# Patient Record
Sex: Female | Born: 1945 | Race: White | Hispanic: No | Smoking: Former smoker
Health system: Southern US, Community
[De-identification: ages and names within clinical notes are randomized; demographics above are authoritative.]

## PROBLEM LIST (undated history)

## (undated) DIAGNOSIS — I4891 Unspecified atrial fibrillation: Secondary | ICD-10-CM

## (undated) DIAGNOSIS — F0390 Unspecified dementia without behavioral disturbance: Secondary | ICD-10-CM

## (undated) DIAGNOSIS — F32A Depression, unspecified: Secondary | ICD-10-CM

## (undated) DIAGNOSIS — C50919 Malignant neoplasm of unspecified site of unspecified female breast: Secondary | ICD-10-CM

## (undated) DIAGNOSIS — I10 Essential (primary) hypertension: Secondary | ICD-10-CM

## (undated) DIAGNOSIS — I639 Cerebral infarction, unspecified: Secondary | ICD-10-CM

## (undated) DIAGNOSIS — I5042 Chronic combined systolic (congestive) and diastolic (congestive) heart failure: Secondary | ICD-10-CM

## (undated) DIAGNOSIS — J449 Chronic obstructive pulmonary disease, unspecified: Secondary | ICD-10-CM

## (undated) DIAGNOSIS — F419 Anxiety disorder, unspecified: Secondary | ICD-10-CM

## (undated) DIAGNOSIS — C801 Malignant (primary) neoplasm, unspecified: Secondary | ICD-10-CM

## (undated) HISTORY — DX: Malignant neoplasm of unspecified site of unspecified female breast: C50.919

## (undated) HISTORY — DX: Unspecified atrial fibrillation: I48.91

## (undated) HISTORY — DX: Chronic combined systolic (congestive) and diastolic (congestive) heart failure: I50.42

## (undated) HISTORY — PX: ABDOMINAL HYSTERECTOMY: SHX81

---

## 2015-04-29 DIAGNOSIS — I480 Paroxysmal atrial fibrillation: Secondary | ICD-10-CM

## 2015-04-29 DIAGNOSIS — I4891 Unspecified atrial fibrillation: Secondary | ICD-10-CM

## 2015-04-29 HISTORY — DX: Paroxysmal atrial fibrillation: I48.0

## 2015-04-29 HISTORY — DX: Unspecified atrial fibrillation: I48.91

## 2015-10-25 DIAGNOSIS — I480 Paroxysmal atrial fibrillation: Secondary | ICD-10-CM | POA: Diagnosis present

## 2015-10-25 DIAGNOSIS — I10 Essential (primary) hypertension: Secondary | ICD-10-CM | POA: Diagnosis present

## 2015-10-25 DIAGNOSIS — I4819 Other persistent atrial fibrillation: Secondary | ICD-10-CM

## 2017-05-06 DIAGNOSIS — Z87891 Personal history of nicotine dependence: Secondary | ICD-10-CM | POA: Insufficient documentation

## 2017-05-06 DIAGNOSIS — M8589 Other specified disorders of bone density and structure, multiple sites: Secondary | ICD-10-CM | POA: Insufficient documentation

## 2017-05-06 DIAGNOSIS — E782 Mixed hyperlipidemia: Secondary | ICD-10-CM | POA: Diagnosis present

## 2017-05-06 HISTORY — DX: Personal history of nicotine dependence: Z87.891

## 2018-02-25 DIAGNOSIS — Z853 Personal history of malignant neoplasm of breast: Secondary | ICD-10-CM | POA: Insufficient documentation

## 2020-10-29 ENCOUNTER — Observation Stay (HOSPITAL_COMMUNITY)
Admission: EM | Admit: 2020-10-29 | Discharge: 2020-10-30 | Disposition: A | Discharge status: Home / Self Care | Payer: Medicare Other | Attending: Student in an Organized Health Care Education/Training Program | Admitting: Student in an Organized Health Care Education/Training Program

## 2020-10-29 ENCOUNTER — Emergency Department (HOSPITAL_COMMUNITY): Payer: Medicare Other

## 2020-10-29 ENCOUNTER — Encounter (HOSPITAL_COMMUNITY): Payer: Self-pay | Admitting: Emergency Medicine

## 2020-10-29 ENCOUNTER — Other Ambulatory Visit: Payer: Self-pay

## 2020-10-29 ENCOUNTER — Inpatient Hospital Stay (HOSPITAL_COMMUNITY): Payer: Medicare Other

## 2020-10-29 DIAGNOSIS — G459 Transient cerebral ischemic attack, unspecified: Secondary | ICD-10-CM | POA: Diagnosis present

## 2020-10-29 DIAGNOSIS — I4819 Other persistent atrial fibrillation: Secondary | ICD-10-CM | POA: Diagnosis present

## 2020-10-29 DIAGNOSIS — J449 Chronic obstructive pulmonary disease, unspecified: Secondary | ICD-10-CM | POA: Insufficient documentation

## 2020-10-29 DIAGNOSIS — F17210 Nicotine dependence, cigarettes, uncomplicated: Secondary | ICD-10-CM | POA: Diagnosis not present

## 2020-10-29 DIAGNOSIS — U071 COVID-19: Secondary | ICD-10-CM | POA: Insufficient documentation

## 2020-10-29 DIAGNOSIS — I10 Essential (primary) hypertension: Secondary | ICD-10-CM | POA: Diagnosis present

## 2020-10-29 DIAGNOSIS — I480 Paroxysmal atrial fibrillation: Secondary | ICD-10-CM | POA: Insufficient documentation

## 2020-10-29 DIAGNOSIS — I639 Cerebral infarction, unspecified: Principal | ICD-10-CM | POA: Diagnosis present

## 2020-10-29 DIAGNOSIS — R29810 Facial weakness: Secondary | ICD-10-CM

## 2020-10-29 DIAGNOSIS — R4781 Slurred speech: Secondary | ICD-10-CM

## 2020-10-29 DIAGNOSIS — E782 Mixed hyperlipidemia: Secondary | ICD-10-CM | POA: Diagnosis present

## 2020-10-29 DIAGNOSIS — E876 Hypokalemia: Secondary | ICD-10-CM | POA: Insufficient documentation

## 2020-10-29 HISTORY — DX: Chronic obstructive pulmonary disease, unspecified: J44.9

## 2020-10-29 HISTORY — DX: Cerebral infarction, unspecified: I63.9

## 2020-10-29 HISTORY — DX: Unspecified dementia, unspecified severity, without behavioral disturbance, psychotic disturbance, mood disturbance, and anxiety: F03.90

## 2020-10-29 HISTORY — DX: Malignant (primary) neoplasm, unspecified: C80.1

## 2020-10-29 LAB — CBC
HCT: 35 % — ABNORMAL LOW (ref 36.0–46.0)
Hemoglobin: 12 g/dL (ref 12.0–15.0)
MCH: 30.1 pg (ref 26.0–34.0)
MCHC: 34.3 g/dL (ref 30.0–36.0)
MCV: 87.7 fL (ref 80.0–100.0)
Platelets: 359 10*3/uL (ref 150–400)
RBC: 3.99 MIL/uL (ref 3.87–5.11)
RDW: 13.6 % (ref 11.5–15.5)
WBC: 8.5 10*3/uL (ref 4.0–10.5)
nRBC: 0 % (ref 0.0–0.2)

## 2020-10-29 LAB — COMPREHENSIVE METABOLIC PANEL
ALT: 16 U/L (ref 0–44)
AST: 22 U/L (ref 15–41)
Albumin: 3.3 g/dL — ABNORMAL LOW (ref 3.5–5.0)
Alkaline Phosphatase: 81 U/L (ref 38–126)
Anion gap: 11 (ref 5–15)
BUN: 10 mg/dL (ref 8–23)
CO2: 29 mmol/L (ref 22–32)
Calcium: 8.6 mg/dL — ABNORMAL LOW (ref 8.9–10.3)
Chloride: 97 mmol/L — ABNORMAL LOW (ref 98–111)
Creatinine, Ser: 0.92 mg/dL (ref 0.44–1.00)
GFR, Estimated: >60 mL/min (ref >60)
Glucose, Bld: 128 mg/dL — ABNORMAL HIGH (ref 70–99)
Potassium: 2.5 mmol/L — CL (ref 3.5–5.1)
Sodium: 137 mmol/L (ref 135–145)
Total Bilirubin: 1 mg/dL (ref 0.3–1.2)
Total Protein: 5.8 g/dL — ABNORMAL LOW (ref 6.5–8.1)

## 2020-10-29 LAB — CBG MONITORING, ED: Glucose-Capillary: 127 mg/dL — ABNORMAL HIGH (ref 70–99)

## 2020-10-29 LAB — DIFFERENTIAL
Abs Immature Granulocytes: 0.04 10*3/uL (ref 0.00–0.07)
Basophils Absolute: 0 10*3/uL (ref 0.0–0.1)
Basophils Relative: 0 %
Eosinophils Absolute: 0.1 10*3/uL (ref 0.0–0.5)
Eosinophils Relative: 1 %
Immature Granulocytes: 1 %
Lymphocytes Relative: 10 %
Lymphs Abs: 0.8 10*3/uL (ref 0.7–4.0)
Monocytes Absolute: 0.5 10*3/uL (ref 0.1–1.0)
Monocytes Relative: 6 %
Neutro Abs: 7 10*3/uL (ref 1.7–7.7)
Neutrophils Relative %: 82 %

## 2020-10-29 LAB — PROTIME-INR
INR: 1.1 (ref 0.8–1.2)
Prothrombin Time: 13.8 seconds (ref 11.4–15.2)

## 2020-10-29 LAB — I-STAT CHEM 8, ED
BUN: 9 mg/dL (ref 8–23)
Calcium, Ion: 1.09 mmol/L — ABNORMAL LOW (ref 1.15–1.40)
Chloride: 94 mmol/L — ABNORMAL LOW (ref 98–111)
Creatinine, Ser: 0.9 mg/dL (ref 0.44–1.00)
Glucose, Bld: 127 mg/dL — ABNORMAL HIGH (ref 70–99)
HCT: 36 % (ref 36.0–46.0)
Hemoglobin: 12.2 g/dL (ref 12.0–15.0)
Potassium: 2.3 mmol/L — CL (ref 3.5–5.1)
Sodium: 137 mmol/L (ref 135–145)
TCO2: 27 mmol/L (ref 22–32)

## 2020-10-29 LAB — RESP PANEL BY RT-PCR (FLU A&B, COVID) ARPGX2
Influenza A by PCR: NEGATIVE
Influenza B by PCR: NEGATIVE
SARS Coronavirus 2 by RT PCR: POSITIVE — AB

## 2020-10-29 LAB — APTT: aPTT: 24 seconds (ref 24–36)

## 2020-10-29 LAB — MAGNESIUM: Magnesium: 1.4 mg/dL — ABNORMAL LOW (ref 1.7–2.4)

## 2020-10-29 MED ORDER — STROKE: EARLY STAGES OF RECOVERY BOOK: Freq: Once | Status: DC

## 2020-10-29 MED ORDER — IOHEXOL 350 MG/ML SOLN
100.0000 mL | Freq: Once | INTRAVENOUS | Status: AC | PRN
  Administered 2020-10-29: 100 mL via INTRAVENOUS

## 2020-10-29 MED ORDER — POTASSIUM CHLORIDE 10 MEQ/100ML IV SOLN
10.0000 meq | INTRAVENOUS | Status: AC
  Administered 2020-10-29 (×2): 10 meq via INTRAVENOUS
  Filled 2020-10-29 (×2): qty 100

## 2020-10-29 MED ORDER — ACETAMINOPHEN 325 MG PO TABS: 650.0000 mg | ORAL_TABLET | ORAL | Status: DC | PRN

## 2020-10-29 MED ORDER — ACETAMINOPHEN 160 MG/5ML PO SOLN: 650.0000 mg | ORAL | Status: DC | PRN

## 2020-10-29 MED ORDER — CLOPIDOGREL BISULFATE 300 MG PO TABS
300.0000 mg | ORAL_TABLET | Freq: Once | ORAL | Status: AC
  Administered 2020-10-29: 300 mg via ORAL
  Filled 2020-10-29: qty 1

## 2020-10-29 MED ORDER — CLOPIDOGREL BISULFATE 75 MG PO TABS
75.0000 mg | ORAL_TABLET | Freq: Every day | ORAL | Status: DC
  Administered 2020-10-30: 75 mg via ORAL
  Filled 2020-10-29: qty 1

## 2020-10-29 MED ORDER — MAGNESIUM OXIDE -MG SUPPLEMENT 400 (240 MG) MG PO TABS
800.0000 mg | ORAL_TABLET | Freq: Every day | ORAL | Status: DC
  Administered 2020-10-29 – 2020-10-30 (×2): 800 mg via ORAL
  Filled 2020-10-29 (×2): qty 2

## 2020-10-29 MED ORDER — SENNOSIDES-DOCUSATE SODIUM 8.6-50 MG PO TABS: 1.0000 | ORAL_TABLET | Freq: Every evening | ORAL | Status: DC | PRN

## 2020-10-29 MED ORDER — ACETAMINOPHEN 650 MG RE SUPP: 650.0000 mg | RECTAL | Status: DC | PRN

## 2020-10-29 MED ORDER — SODIUM CHLORIDE 0.9% FLUSH: 3.0000 mL | Freq: Once | INTRAVENOUS | Status: DC

## 2020-10-29 MED ORDER — ASPIRIN EC 325 MG PO TBEC
325.0000 mg | DELAYED_RELEASE_TABLET | Freq: Every day | ORAL | Status: DC
  Administered 2020-10-29 – 2020-10-30 (×2): 325 mg via ORAL
  Filled 2020-10-29 (×2): qty 1

## 2020-10-29 NOTE — ED Provider Notes (Signed)
[place] EMERGENCY DEPARTMENT Provider Note   CSN: [id number] Arrival date & time: 10/29/20  1306  An emergency department physician performed an initial assessment on this suspected stroke patient at 1307.  History Chief Complaint  Patient presents with   Code Stroke    Marisa Nunez is a 75 y.o. female brought in by EMS for evaluation of facial droop, slurred speech.  Patient reports she was at home when her daughter noticed that patient's left side of her face was drooping at about 1220.  Daughter also noted some slurred speech.  They called EMS.  EMS reports that by the time they got there, symptoms had resolved.  During transport, she had an additional episode where they noted slurred speech, facial droop.  As they arrived to the ED, symptoms seem to resolve.  Patient reports that she has felt normal today.  She did not realize when her face was drooping.  Do not feel like she was having trouble speaking.  She states that she has not had any weakness.  Currently, she denies any symptoms.  She does have history of hypertension.  No history of diabetes.  No smoking.  She denies any chest pain, difficulty breathing, abdominal pain, nausea/vomiting, numbness/weakness.  The history is provided by the patient.      Past Medical History:  Diagnosis Date   Cancer ([place])    COPD (chronic obstructive pulmonary disease) ([place])    Dementia ([place])    Stroke ([place])     There are no problems to display for this patient.   Past Surgical History:  Procedure Laterality Date   ABDOMINAL HYSTERECTOMY       OB History   No obstetric history on file.     No family history on file.  Social History   Tobacco Use   Smoking status: Every Day    Packs/day: 0.50    Pack years: 0.00    Types: Cigarettes  Substance Use Topics   Alcohol use: Yes    Alcohol/week: 6.0 standard drinks    Types: 6 Cans of beer per week    Home Medications Prior to Admission medications    Not on File    Allergies    Patient has no known allergies.  Review of Systems   Review of Systems  Constitutional:  Negative for fever.  Eyes:  Negative for visual disturbance.  Respiratory:  Negative for cough and shortness of breath.   Cardiovascular:  Negative for chest pain.  Gastrointestinal:  Negative for abdominal pain, nausea and vomiting.  Genitourinary:  Negative for dysuria and hematuria.  Neurological:  Positive for facial asymmetry and speech difficulty. Negative for headaches.  All other systems reviewed and are negative.  Physical Exam Updated Vital Signs BP (!) 158/80   Pulse 66   Temp 98.4 F (36.9 C) (Oral)   Resp (!) 27   Ht 5\' 4"  (1.626 m)   Wt 61 kg   SpO2 99%   BMI 23.08 kg/m   Physical Exam Vitals and nursing note reviewed.  Constitutional:      Appearance: Normal appearance. She is well-developed.  HENT:     Head: Normocephalic and atraumatic.  Eyes:     General: Lids are normal.     Conjunctiva/sclera: Conjunctivae normal.     Pupils: Pupils are equal, round, and reactive to light.     Comments: PERRL. EOMs intact. No nystagmus. No neglect.   Cardiovascular:     Rate and Rhythm: Normal rate and  regular rhythm.     Pulses: Normal pulses.          Radial pulses are 2+ on the right side and 2+ on the left side.       Dorsalis pedis pulses are 2+ on the right side and 2+ on the left side.     Heart sounds: Normal heart sounds. No murmur heard.   No friction rub. No gallop.  Pulmonary:     Effort: Pulmonary effort is normal.     Breath sounds: Normal breath sounds.     Comments: Lungs clear to auscultation bilaterally.  Symmetric chest rise.  No wheezing, rales, rhonchi. Abdominal:     Palpations: Abdomen is soft. Abdomen is not rigid.     Tenderness: There is no abdominal tenderness. There is no guarding.     Comments: Abdomen is soft, non-distended, non-tender. No rigidity, No guarding. No peritoneal signs.  Musculoskeletal:         General: Normal range of motion.     Cervical back: Full passive range of motion without pain.  Skin:    General: Skin is warm and dry.     Capillary Refill: Capillary refill takes less than 2 seconds.  Neurological:     Mental Status: She is alert and oriented to person, place, and time.     Comments: Cranial nerves III-XII intact Follows commands, Moves all extremities  5/5 strength to BUE and BLE  Sensation intact throughout all major nerve distributions No slurred speech. No facial droop.   Psychiatric:        Speech: Speech normal.    ED Results / Procedures / Treatments   Labs (all labs ordered are listed, but only abnormal results are displayed) Labs Reviewed  CBC - Abnormal; Notable for the following components:      Result Value   HCT 35.0 (*)    All other components within normal limits  COMPREHENSIVE METABOLIC PANEL - Abnormal; Notable for the following components:   Potassium 2.5 (*)    Chloride 97 (*)    Glucose, Bld 128 (*)    Calcium 8.6 (*)    Total Protein 5.8 (*)    Albumin 3.3 (*)    All other components within normal limits  MAGNESIUM - Abnormal; Notable for the following components:   Magnesium 1.4 (*)    All other components within normal limits  I-STAT CHEM 8, ED - Abnormal; Notable for the following components:   Potassium 2.3 (*)    Chloride 94 (*)    Glucose, Bld 127 (*)    Calcium, Ion 1.09 (*)    All other components within normal limits  CBG MONITORING, ED - Abnormal; Notable for the following components:   Glucose-Capillary 127 (*)    All other components within normal limits  RESP PANEL BY RT-PCR (FLU A&B, COVID) [id number]  PROTIME-INR  APTT  DIFFERENTIAL    EKG EKG Interpretation  Date/Time:  Monday October 29 2020 13:36:26 EDT Ventricular Rate:  71 PR Interval:  182 QRS Duration: 107 QT Interval:  445 QTC Calculation: 484 R Axis:   58 Text Interpretation: Sinus rhythm Minimal ST depression, anterolateral leads No STEMI Confirmed  by Octaviano Glow [phone]) on 10/29/2020 2:14:40 PM  Radiology CT HEAD CODE STROKE WO CONTRAST  Result Date: 10/29/2020 CLINICAL DATA:  Code stroke. EXAM: CT HEAD WITHOUT CONTRAST TECHNIQUE: Contiguous axial images were obtained from the base of the skull through the vertex without intravenous contrast. COMPARISON:  None. FINDINGS: Brain: There is  no acute intracranial hemorrhage, mass effect, or edema. Gray-white differentiation is preserved. Prominence of the ventricles and sulci reflects generalized parenchymal volume loss. Patchy and confluent areas of low-attenuation in the supratentorial white matter is nonspecific but may reflect moderate chronic microvascular ischemic changes. Possible chronic anteroinferior right cerebellar infarct. Additional more posterior age-indeterminate small right cerebellar infarct. No extra-axial collection. Vascular: No hyperdense vessel. Intracranial atherosclerotic calcification at the skull base. Skull: Unremarkable. Sinuses/Orbits: No acute abnormality. Other: Mastoid air cells are clear. ASPECTS ([place] Stroke Program Early CT Score) - Ganglionic level infarction (caudate, lentiform nuclei, internal capsule, insula, M1-M3 cortex): 7 - Supraganglionic infarction (M4-M6 cortex): 3 Total score (0-10 with 10 being normal): 10 IMPRESSION: There is no acute intracranial hemorrhage or evidence of acute infarction. ASPECT score is 10. Age-indeterminate small right cerebellar infarct. Moderate chronic microvascular ischemic changes. These results were communicated to Dr. Leonel Ramsay at 1:22 pm on 10/29/2020 by text page via the [facility] messaging system. Electronically Signed   By: Macy Mis M.D.   On: 10/29/2020 13:26   CT ANGIO HEAD NECK W WO CM (CODE STROKE)  Result Date: 10/29/2020 CLINICAL DATA:  Episode of slurred speech EXAM: CT ANGIOGRAPHY HEAD AND NECK TECHNIQUE: Multidetector CT imaging of the head and neck was performed using the standard protocol during bolus  administration of intravenous contrast. Multiplanar CT image reconstructions and MIPs were obtained to evaluate the vascular anatomy. Carotid stenosis measurements (when applicable) are obtained utilizing NASCET criteria, using the distal internal carotid diameter as the denominator. CONTRAST:  121mL OMNIPAQUE IOHEXOL 350 MG/ML SOLN COMPARISON:  None. FINDINGS: CTA NECK Aortic arch: Moderate mixed plaque along the included arch. Great vessel origins are patent. No high-grade proximal subclavian stenosis. Right carotid system: Patent. Calcified plaque along the common carotid causing less than 50% stenosis. Calcified plaque along the proximal internal carotid causing less than 50% stenosis. Left carotid system: Patent. Mixed plaque along the common carotid causing less than 50% stenosis. There is significant motion artifact through the bifurcation. Calcified plaque is present without apparent hemodynamically significant stenosis. Vertebral arteries: Patent.  Codominant.  No stenosis. Skeleton: Advanced degenerative changes of cervical spine. Other neck: Unremarkable. Upper chest: No apical lung mass. Review of the MIP images confirms the above findings CTA HEAD Anterior circulation: Intracranial internal carotid arteries are patent with mild calcified plaque and no significant stenosis. Anterior and middle cerebral arteries are patent. Posterior circulation: Intracranial vertebral arteries are patent. Basilar artery is patent. Major cerebellar artery origins are patent. Small left posterior communicating arteries present. Posterior cerebral arteries are patent. Venous sinuses: Patent as allowed by contrast bolus timing. Review of the MIP images confirms the above findings IMPRESSION: No large vessel occlusion in the head or neck. Plaque along the common and internal carotid causes less than 50% stenosis, noting that the left common carotid bifurcation is not well evaluated due to artifact. These results were  communicated to Dr. Leonel Ramsay at 1:36 pm on 10/29/2020 by text page via the [facility] messaging system. Electronically Signed   By: Macy Mis M.D.   On: 10/29/2020 13:46    Procedures Procedures   Medications Ordered in ED Medications  sodium chloride flush (NS) 0.9 % injection 3 mL (3 mLs Intravenous Not Given 10/29/20 1352)  clopidogrel (PLAVIX) tablet 75 mg (has no administration in time range)  aspirin EC tablet 325 mg (325 mg Oral Given 10/29/20 1338)  potassium chloride 10 mEq in 100 mL IVPB (10 mEq Intravenous New Bag/Given 10/29/20 1439)  magnesium oxide (MAG-OX)  tablet 800 mg (has no administration in time range)  clopidogrel (PLAVIX) tablet 300 mg (300 mg Oral Given 10/29/20 1339)  iohexol (OMNIPAQUE) 350 MG/ML injection 100 mL (100 mLs Intravenous Contrast Given 10/29/20 1332)    ED Course  I have reviewed the triage vital signs and the nursing notes.  Pertinent labs & imaging results that were available during my care of the patient were reviewed by me and considered in my medical decision making (see chart for details).  Clinical Course as of 10/29/20 1600  Mon Oct 29, 6528  6530 75 year old female presenting to the emergency department for right-sided facial droop this morning.  This was transient and since resolved.  Reports a history of hypertension that is controlled on blood pressure medications.  Denies any other significant comorbidities, denies smoking history, denies history of stroke.  On exam her neurological exam is intact.  CT scan showed age-indeterminate small cerebellar infarct.  Patient to be admitted for TIA evaluation, likely MRI.  Patient made aware of work-up.  She was also noted to be hypokalemic and potassium was ordered here. [MT]  1414 No chest pain or pressure.  EKG does not show STEMI.  I doubt this is ACS. [MT]    Clinical Course User Index [MT] Trifan, Carola Rhine, MD   MDM Rules/Calculators/A&P                          75 year old female brought in by  EMS for evaluation of facial droop, slurred speech.  They report that did not resolve by the time they got there.  During transport, she had additional episode that started to resolve by the time she got to the ED.  On initial arrival, she is afebrile nontoxic-appearing.  Vital signs are stable.  On exam, no obvious facial droop, slurred speech noted.  Patient initially presented as code stroke.  I-STAT Chem-8 shows potassium 2.3.  CBC shows no leukocytosis or anemia.  CMP shows potassium of 2.5.  BUN/creatinine within normal limits.  We will add on mag.  Magnesium is low at 1.4.  CT head negative for any acute infection.  CTA shows no large vessel occlusion in the head or neck.  She does have some carotid artery stenosis.  Discussed with Dr. Leonel Ramsay (neuro).  He recommends obtaining an MRI.  Patient will need admission for TIA work-up.  Discussed with internal medicine team who accepts patient for admission.   Portions of this note were generated with [employer]. Dictation errors may occur despite best attempts at proofreading.  Final Clinical Impression(s) / ED Diagnoses Final diagnoses:  Slurred speech  Facial droop  Hypokalemia  Hypomagnesemia    Rx / DC Orders ED Discharge Orders     None        Volanda Napoleon, PA-C 10/29/20 1600    Wyvonnia Dusky, MD 10/29/20 1652

## 2020-10-29 NOTE — Code Documentation (Addendum)
Pt is 75 yr old female arriving to [facility] at 75 for evaluation of facial droop and slurred speech. Pt EMS report, pt was last known well by family at 42 when she had sudden onset of slurred speech and facial asymmetry. Pt was cleared at bridge by EDP, CBG and blood drawn. Pt was taken to CT at 1310. Pt was dysarthric and tongue deviated to the right. She was unable to recall the month (NIHSS 3). She was alert and fluent, without weakness or visual disturbances. Per Dr Leonel Ramsay, [facility] negative for acute hemorrhage. After CTH, pt's dysarthria and tongue deviation resolved. CTA obtained, and per Dr Leonel Ramsay, was negative for LVO. Pt was taken to room 22. [facility] Stroke swallow screen was performed, which patient passed. Pt was then given 325 Asprin and 300 mg Plavix. Pt will be monitored q 15 min VS and q 30 min neuro checks until outside of window for TPA (1720). Pt not given TPA as too mild to treat. Pt not given NIR as LVO negative. Bedside handoff with Production assistant, radio.

## 2020-10-29 NOTE — Consult Note (Signed)
Neurology Consultation Reason for Consult: Slurred speech Referring Physician: Rudean Haskell  CC: Slurred speech  History is obtained from: Patient  HPI: Marisa Nunez is a 75 y.o. female with a history of dementia as well as stroke who presents with slurred speech that started abruptly this afternoon.  She was in her normal state of health and talking to her dogs, and then her daughter noticed a sudden change in her speech at 12:50 PM.  She then looked at her daughter, and the daughter noticed left facial droop and therefore she called 911.  Initially, her symptoms cleared on EMS arrival, but while she was being evaluated in the truck, her symptoms returned with severe dysarthria.  On arrival, she had severe dysarthria with right tongue deviation.  Initially, her dysarthria was severe enough that I felt it to be a disabling deficit and actually initially began to approach the patient and her daughter about IV tPA, however following completion of imaging she had cleared to the point where she no longer had a disabling deficit and therefore tPA was not administered.  LKW: 12:50 PM tpa given?: no, mild symptoms    ROS: A 14 point ROS was performed and is negative except as noted in the HPI.  Past Medical History:  Diagnosis Date   Cancer ([place])    COPD (chronic obstructive pulmonary disease) ([place])    Dementia ([place])    Stroke ([place])      Family history: Not initially obtained due to severe dysarthria   Social History:  reports that she has been smoking cigarettes. She has been smoking an average of 0.50 packs per day. She does not have any smokeless tobacco history on file. She reports current alcohol use of about 6.0 standard drinks of alcohol per week. No history on file for drug use.   Exam: Current vital signs: BP (!) 140/119   Pulse 67   Temp 98.4 F (36.9 C) (Oral)   Resp 18   Ht 5\' 4"  (1.626 m)   Wt 61 kg   SpO2 100%   BMI 23.08 kg/m  Vital signs in last 24 hours: Temp:   [98.4 F (36.9 C)] 98.4 F (36.9 C) (07/04 1430) Pulse Rate:  [66-75] 67 (07/04 1545) Resp:  [10-28] 18 (07/04 1530) BP: (128-158)/(62-119) 140/119 (07/04 1545) SpO2:  [98 %-100 %] 100 % (07/04 1545) Weight:  [61 kg] 61 kg (07/04 1430)   Physical Exam  Constitutional: Appears well-developed and well-nourished.  Psych: Affect appropriate to situation Eyes: No scleral injection HENT: No OP obstruction MSK: no joint deformities.  Cardiovascular: Normal rate and regular rhythm.  Respiratory: Effort normal, non-labored breathing GI: Soft.  No distension. There is no tenderness.  Skin: WDI  Neuro: Mental Status: Patient is awake, alert, oriented to person, but not month or year  No signs of aphasia or neglect Cranial Nerves: II: Visual Fields are full. Pupils are equal, round, and reactive to light.   III,IV, VI: EOMI without ptosis or diploplia.  V: Facial sensation is symmetric to temperature VII: Facial movement is symmetric  VIII: hearing is intact to voice X: Uvula elevates symmetrically XI: Shoulder shrug is symmetric. XII: tongue initially deviated significantly to the right Motor: Tone is normal. Bulk is normal. 5/5 strength was present in all four extremities.  Sensory: Sensation is symmetric to light touch and temperature in the arms and legs. Cerebellar: FNF intact bilaterally   I have reviewed labs in epic and the results pertinent to this consultation are: Creatinine  0.9  I have reviewed the images obtained: CT/CTA-negative  Impression: 75 year old female with recurrent severe dysarthria most consistent with small brainstem stroke/TIA.  With her improvement in symptoms, she is not currently an IV tPA candidate but she still has very mild dysarthria, though improving.   Recommendations: - HgbA1c, fasting lipid panel - MRI of the brain without contrast - Frequent neuro checks - Echocardiogram - Prophylactic therapy-ASA 81 mg and plavix 75mg  daily after 300mg   load.  - Risk factor modification - Telemetry monitoring - PT consult, OT consult, Speech consult - Stroke team to follow   Roland Rack, MD [facility] [phone]  If 7pm- 7am, please page neurology on call as listed in [place].

## 2020-10-29 NOTE — ED Notes (Signed)
Patient transported to MRI 

## 2020-10-29 NOTE — H&P (Addendum)
Date: 10/29/2020               Patient Name:  Marisa Nunez MRN: [id number]  DOB: 06-Nov-1945 Age / Sex: 75 y.o., female   PCP: Pcp, No         Medical Service: Internal Medicine Teaching Service         Attending Physician: Dr. Evette Doffing, Mallie Mussel, *    First Contact: Dr. Scarlett Presto Pager: [phone]  Second Contact: Dr. Maudie Mercury Pager: [phone]       After Hours (After 5p/  First Contact Pager: [phone]  weekends / holidays): Second Contact Pager: [phone]   Chief Complaint: "Memory Problems"  History of Present Illness:  Per chart review, patient was brought in for sudden onset facial droop and slurred speech. However patient is unable to remember everything about her history  Patient says that she has had a history of trouble remembering things for a while, but she isn't sure how long. Today her daughter told her that she thought she should come in to the doctor today to find out what was going on. Patient denies any specific trigger that caused her daughter to bring her in today.   Physically says she is has no complaints, she denies any weakness, trouble walking, dizziness, trouble with speech, sensation, or with coordination.   Meds:  Patient does not remember the medications she is taking and say we should talk with her daughter, thinks that she takes a medication for high blood pressure   Allergies: Allergies as of 10/29/2020   (No Known Allergies)   Past Medical History:  Diagnosis Date   Cancer ([place])    COPD (chronic obstructive pulmonary disease) ([place])    Dementia ([place])    Stroke ([place])     Family History: Patient denies any family history of stroke, endorses a history of heart problems in a grandfather  Social History: Lives with a relative, retired, prior Interior and spatial designer, denies any alcohol or current tobacco use, previously smoked 0.5 ppd for about 25 years. Denies any recreational drug use  Review of Systems: A complete ROS was negative  except as per HPI.   Physical Exam: Blood pressure (!) 140/119, pulse 67, temperature 98.4 F (36.9 C), temperature source Oral, resp. rate 18, height 5\' 4"  (1.626 m), weight 61 kg, SpO2 100 %. Gen: well appearing elderly woman resting comfortably in bed HEENT: neck supple, mucous membranes moist, sclerae anicteric CV: RRR, no m/r/g Resp: CTAB GI: soft, nontender, nondistended MSK: Normal bulk and tone, strength 5/5 throughout Neuro: Alert and oriented to self, place, and year. Cranial nerves 2-12 grossly intact, normal coordination, speech slightly dysarthric, memory 0/3  EKG: Sinus rhythm  CT Head:   IMPRESSION: There is no acute intracranial hemorrhage or evidence of acute infarction. ASPECT score is 10.   Age-indeterminate small right cerebellar infarct.   Moderate chronic microvascular ischemic changes.  CT Angio Head and Neck IMPRESSION: No large vessel occlusion in the head or neck. Plaque along the common and internal carotid causes less than 50% stenosis, noting that the left common carotid bifurcation is not well evaluated due to artifact.  Assessment & Plan by Problem: Principal Problem:   Cerebral infarction [facility]) Active Problems:   Essential hypertension   Mixed hyperlipidemia   PAF (paroxysmal atrial fibrillation) (HCC)  Marisa Nunez is a 75 year old woman with a PMS of dementia, prior stroke, and HTN  admitted for a TIA vs stroke.  TIA/Possible stroke Patient initially presented  with left facial droop and dysarthria that seem to have resolved. CT head was negative. -neurology following, appreciate assitance - CT angio showed plaque with <50% occulusion but study limited due to artifact -start aspirin 325 and plavix 75 mg -TSH, Ha1c, Lipid panel pending -MRI Brain pending  COVID 19 Infection Patient saturating well on room air and denies shortness of breath but does have a mild cough. -CTM for progression of symptoms or new onset O2  requirement  Atrial Fibrillation  Per care everywhere patient has been seen by cardiology for afib. Had an admission in 2017 for RVR when she converted to NSR with. Prior echo at that time. Unclear whether she had been anticoagulated prior to admission -likely taking xarelto, per daughter does not think she has been taking this anymore -takes metoprolol succinate 50 mg at home  HTN holding antihypertensives at this time 2/2 permissive hypertension in setting of possible stroke -per daughter taking losartan 50 mg daily and chlorthalidone 25 mg daily at home  COPD Prior smoker. Per daughter she uses an albuterol inhaler -continue albuterol   Anxiety/Depression Per chart review taking 50 mg zoloft and 75 mg wellbutrin at home -confirm with daughter and restart  Dispo: Admit patient to Inpatient with expected length of stay greater than 2 midnights.  Signed: Scarlett Presto, MD 10/29/2020, 4:58 PM  Pager: [phone] After 5pm on weekdays and 1pm on weekends: On Call pager: [phone]

## 2020-10-29 NOTE — ED Triage Notes (Signed)
Pt from home via [facility] as code stroke. Pt had sudden onset of slurred speech and L sided facial droop at 1220 today. Pt's symptoms had resolved by 1230 on EMS arrival. During transport, same symptoms recurred and resolved multiple times.

## 2020-10-30 ENCOUNTER — Inpatient Hospital Stay (HOSPITAL_BASED_OUTPATIENT_CLINIC_OR_DEPARTMENT_OTHER): Payer: Medicare Other

## 2020-10-30 DIAGNOSIS — G459 Transient cerebral ischemic attack, unspecified: Secondary | ICD-10-CM | POA: Diagnosis present

## 2020-10-30 DIAGNOSIS — I6300 Cerebral infarction due to thrombosis of unspecified precerebral artery: Secondary | ICD-10-CM

## 2020-10-30 DIAGNOSIS — I639 Cerebral infarction, unspecified: Secondary | ICD-10-CM

## 2020-10-30 DIAGNOSIS — U071 COVID-19: Secondary | ICD-10-CM | POA: Diagnosis present

## 2020-10-30 HISTORY — DX: Transient cerebral ischemic attack, unspecified: G45.9

## 2020-10-30 LAB — COMPREHENSIVE METABOLIC PANEL
ALT: 16 U/L (ref 0–44)
AST: 20 U/L (ref 15–41)
Albumin: 3.6 g/dL (ref 3.5–5.0)
Alkaline Phosphatase: 78 U/L (ref 38–126)
Anion gap: 12 (ref 5–15)
BUN: 8 mg/dL (ref 8–23)
CO2: 25 mmol/L (ref 22–32)
Calcium: 8.9 mg/dL (ref 8.9–10.3)
Chloride: 95 mmol/L — ABNORMAL LOW (ref 98–111)
Creatinine, Ser: 0.8 mg/dL (ref 0.44–1.00)
GFR, Estimated: >60 mL/min (ref >60)
Glucose, Bld: 101 mg/dL — ABNORMAL HIGH (ref 70–99)
Potassium: 2.4 mmol/L — CL (ref 3.5–5.1)
Sodium: 132 mmol/L — ABNORMAL LOW (ref 135–145)
Total Bilirubin: 0.8 mg/dL (ref 0.3–1.2)
Total Protein: 6 g/dL — ABNORMAL LOW (ref 6.5–8.1)

## 2020-10-30 LAB — LIPID PANEL
Cholesterol: 202 mg/dL — ABNORMAL HIGH (ref 0–200)
HDL: 88 mg/dL (ref >40)
LDL Cholesterol: 100 mg/dL — ABNORMAL HIGH (ref 0–99)
Total CHOL/HDL Ratio: 2.3 RATIO
Triglycerides: 70 mg/dL (ref <150)
VLDL: 14 mg/dL (ref 0–40)

## 2020-10-30 LAB — ECHOCARDIOGRAM COMPLETE
Area-P 1/2: 3.51 cm2
Calc EF: 55.4 %
Height: 64 in
MV M vel: 5.52 m/s
MV Peak grad: 121.9 mmHg
S' Lateral: 3.3 cm
Single Plane A2C EF: 56.2 %
Single Plane A4C EF: 55.2 %
Weight: 2151.69 oz

## 2020-10-30 LAB — HEMOGLOBIN A1C
Hgb A1c MFr Bld: 5.6 % (ref 4.8–5.6)
Mean Plasma Glucose: 114.02 mg/dL

## 2020-10-30 LAB — TSH: TSH: 2.037 u[IU]/mL (ref 0.350–4.500)

## 2020-10-30 MED ORDER — POTASSIUM CHLORIDE CRYS ER 20 MEQ PO TBCR
40.0000 meq | EXTENDED_RELEASE_TABLET | Freq: Two times a day (BID) | ORAL | Status: DC
  Administered 2020-10-30: 40 meq via ORAL
  Filled 2020-10-30: qty 2

## 2020-10-30 MED ORDER — ATORVASTATIN CALCIUM 40 MG PO TABS
80.0000 mg | ORAL_TABLET | Freq: Every day | ORAL | Status: DC
  Administered 2020-10-30: 80 mg via ORAL
  Filled 2020-10-30: qty 2

## 2020-10-30 MED ORDER — APIXABAN 5 MG PO TABS: 5.0000 mg | ORAL_TABLET | Freq: Two times a day (BID) | ORAL | 0 refills | Status: DC

## 2020-10-30 MED ORDER — ATORVASTATIN CALCIUM 80 MG PO TABS: 80.0000 mg | ORAL_TABLET | Freq: Every day | ORAL | 0 refills | Status: DC

## 2020-10-30 NOTE — Progress Notes (Signed)
  Echocardiogram 2D Echocardiogram has been performed.  Marisa Nunez 10/30/2020, 9:48 AM

## 2020-10-30 NOTE — Care Management CC44 (Signed)
Condition Code 44 Documentation Completed  Patient Details  Name: Marisa Nunez MRN: [id number] Date of Birth: 1945/07/12   Condition Code 44 given:  Yes Patient signature on Condition Code 44 notice:  Yes Documentation of 2 MD's agreement:  Yes Code 44 added to claim:  Yes    Fuller Mandril, RN 10/30/2020, 1:50 PM

## 2020-10-30 NOTE — Care Management Obs Status (Signed)
MEDICARE OBSERVATION STATUS NOTIFICATION   Patient Details  Name: Aracelie Addis MRN: [id number] Date of Birth: [birth year]   Medicare Observation Status Notification Given:  Yes    Fuller Mandril, RN 10/30/2020, 1:50 PM

## 2020-10-30 NOTE — Discharge Summary (Addendum)
Name: Marisa Nunez MRN: [id number] DOB: Jul 31, 1945 75 y.o. PCP: Pcp, No  Date of Admission: 10/29/2020  1:08 PM Date of Discharge:  Attending Physician: Axel Filler, *  Discharge Diagnosis: Principal Problem:   Cerebral infarction Marisa Nunez) Active Problems:   Essential hypertension   Mixed hyperlipidemia   PAF (paroxysmal atrial fibrillation) ([place])   COVID-19   TIA (transient ischemic attack)   Discharge Medications: Allergies as of 10/30/2020   No Known Allergies      Medication List     STOP taking these medications    cyclobenzaprine 10 MG tablet Commonly known as: FLEXERIL   potassium chloride 10 MEQ tablet Commonly known as: KLOR-CON       TAKE these medications    apixaban 5 MG Tabs tablet Commonly known as: ELIQUIS Take 1 tablet (5 mg total) by mouth 2 (two) times daily.   atorvastatin 80 MG tablet Commonly known as: LIPITOR Take 1 tablet (80 mg total) by mouth daily. Start taking on: October 31, 2020   buPROPion 75 MG tablet Commonly known as: WELLBUTRIN Take 75 mg by mouth 2 (two) times daily.   chlorthalidone 25 MG tablet Commonly known as: HYGROTON Take 25 mg by mouth daily.   losartan 50 MG tablet Commonly known as: COZAAR Take 50 mg by mouth daily.   memantine 10 MG tablet Commonly known as: NAMENDA Take 10 mg by mouth in the morning.   metoprolol succinate 50 MG 24 hr tablet Commonly known as: TOPROL-XL Take 50 mg by mouth daily. Take with or immediately following a meal.   sertraline 50 MG tablet Commonly known as: ZOLOFT Take 50 mg by mouth daily.        Disposition and follow-up:   Marisa Nunez was discharged from [facility] in Good condition.  At the [facility] follow up visit please address:  1.  Left Frontal Lobe Cerebral Infarction -unclear if patient was anticoagulated in setting of recent infarct, but was previously anticoagulated in the past. Anticoagulation should be restarted once risk  of bleed has decreased  2. Paroxysmal Atrial Fibrillation May consider referral to a cardiologist to manage her atrial fibrillation. Restart anticoagulation as above  3. Hypertension Patient takes losartan 50 and chlorthalidone 25 mg. Titrate as needed  4. HLD  continue high dose statin  5. Monitor for resolution of COVID-19 symptoms  2.  Labs / imaging needed at time of follow-up: None  3.  Pending labs/ test needing follow-up: None  Follow-up Appointments:  Follow-up Information     Vickie Epley, MD Follow up.   Specialties: Cardiology, Radiology Why: 11/30/2020 @ 3:45PM to discuss heart rhythm monitoring option at the request of your neurologist, Dr. Shelle Iron information: [place] [address] [address] [facility] Course by problem list:  1. Left Frontal Lobe Cerebral Infarction Patient came in for acute onset of dysarthria and facial droop. Patient got a CT head which was negative for acute hemorrhage and patient's symptoms resolved. MRI brain identified a left frontal lobe cerebral infarct. Per chart review, patient had a prior history of atrial fibrillation but due to patient's dementia it is unclear whether she was anticoagulated on admission. Patient received aspirin 325 and plavix 75 with plan to transition to eliquis.   2. HTN Patient takes medication for hypertension at home. Held losartan 50 and chlorthalidone 25 mg in the setting of  permissive hypertension after cerebral infarct. Can restart 48 hours after ischemic event.  3. Paroxysmal Atrial Fibrillation Anticoagulation initially held in the setting of acute cerebral infarct, will plan on restarting eliquis as above. Will plan to continue metoprolol succinate 50 mg upon discharge.   4. COVID-19 infection Patient remained mildly symptomatic with a minor cough. Did not require any supplement oxygen and was breathing comfortably on room air.   5.  Hyperlipidemia Total cholesterol and LDL elevated at 202 and 100 respectively.Continue atorvastatin 80 mg daily   6. Anxiety/Depression Patient takes zoloft 50 and wellbutrin 75 at home which will be continued.   7. COPD  Continue home albuterol  Discharge Exam:   BP (!) 165/107   Pulse 68   Temp 98.3 F (36.8 C) (Oral)   Resp 15   Ht 5\' 4"  (1.626 m)   Wt 61 kg   SpO2 100%   BMI 23.08 kg/m  Discharge exam:  Constitutional: well-appearing and comfortably sitting in the bed, in no acute distress Neurological: alert and oriented to person, cranial nerves 2-12 grossly intact 5/5 strength in the upper extremities, able to lift both lower extremities off the bed without difficulty  Cardiovascular: regular rate and rhythm, no m/r/g Pulmonary/Chest: normal work of breathing on room air, lungs clear to auscultation bilaterally Ext: no edema MSK: normal bulk and tone Skin: warm and dry  Pertinent Labs, Studies, and Procedures:   TSH 0.350 - 4.500 uIU/mL 2.037    Cholesterol 0 - 200 mg/dL 202 High    Triglycerides <150 mg/dL 70   HDL >40 mg/dL 88   Total CHOL/HDL Ratio RATIO 2.3   VLDL 0 - 40 mg/dL 14   LDL Cholesterol 0 - 99 mg/dL 100 High     Hgb A1c MFr Bld 4.8 - 5.6 % 5.6     Discharge Instructions:  You were admitted to [facility] for a stroke. Your symptoms with difficulty speaking and other symptoms after the stroke have resolved. You will be started on an anticoagulant medication to try and prevent a new stroke from happening. You will continue taking your home medications with the following changes.  Start taking eliquis 5 mg two times daily  Please follow up with your new primary care provider at [facility] in about 1 week.    Signed: Scarlett Presto, MD 10/30/2020, 1:06 PM   Pager: [phone]

## 2020-10-30 NOTE — Evaluation (Addendum)
Physical Therapy Evaluation Patient Details Name: Marisa Nunez MRN: [id number] DOB: 03-20-1946 Today's Date: 10/30/2020   History of Present Illness  75yo female admitted 10/29/20 secondary to sudden facial droop and slurred speech at home. Sx cleared by the time EMS arrived, but she then had a second bout of dysarthria and R tongue deviation while on EMS truck. MRI shows L frontal lobe punctuate CVA. Incidentally covid positive. PMH CA, COPD, dementia, CVA  Clinical Impression   Patient received walking from her room to the nurses station on an independent basis- did need reminders to kindly put on her mask due to Covid + status, but followed commands well throughout session. Does demonstrate balance deficits consistent with her general age- only able to maintain SLS for 3-4 seconds and needed MinA for balance with tandem gait. VSS on RA. Unfortunately she is a very poor historian and not able to tell me much about home set up, family unavailable. Very restless and distractable but overall steady on her feet for functional tasks such as transfers and gait in room. Left in ED room with all needs met, RN aware of patient status.   PT signing off for now- thank you for the opportunity to participate in her care!     Follow Up Recommendations Supervision/Assistance - 24 hour;Outpatient PT    Equipment Recommendations  None recommended by PT    Recommendations for Other Services       Precautions / Restrictions Precautions Precautions: Other (comment) Precaution Comments: covid positive Restrictions Weight Bearing Restrictions: No      Mobility  Bed Mobility Overal bed mobility: Independent                  Transfers Overall transfer level: Independent                  Ambulation/Gait Ambulation/Gait assistance: Independent              Financial trader Rankin (Stroke Patients Only)       Balance Overall balance  assessment: Mild deficits observed, not formally tested                                           Pertinent Vitals/Pain Pain Assessment: No/denies pain    Home Living Family/patient expects to be discharged to:: Private residence Living Arrangements: Children;Other (Comment) (daughter) Available Help at Discharge: Family             Additional Comments: pt very poor historian- tells me she [place] in [place]. No family present to confirm or clarify PLOF/mobility status.    Prior Function Level of Independence: Independent               Hand Dominance        Extremity/Trunk Assessment   Upper Extremity Assessment Upper Extremity Assessment: Overall WFL for tasks assessed    Lower Extremity Assessment Lower Extremity Assessment: Overall WFL for tasks assessed    Cervical / Trunk Assessment Cervical / Trunk Assessment: Normal  Communication   Communication: No difficulties  Cognition Arousal/Alertness: Awake/alert Behavior During Therapy: Impulsive;Restless Overall Cognitive Status: Impaired/Different from baseline Area of Impairment: Orientation;Attention;Memory;Following commands;Safety/judgement;Awareness;Problem solving                 Orientation Level: Situation;Time;Disoriented to;Place (  able to say she is in a hospital but not which one and seems to think she is in [place]) Current Attention Level: Selective Memory: Decreased short-term memory;Decreased recall of precautions Following Commands: Follows one step commands consistently Safety/Judgement: Decreased awareness of safety;Decreased awareness of deficits Awareness: Intellectual Problem Solving: Difficulty sequencing;Requires verbal cues General Comments: very pleasantly confused but tells me she is [place] in [place] and that she is here because she is waiting on her friend to finish an appointment; needed reminders about mask wear  outside of room given that she is covid positive. Tells me she never usually pays attention to dates/times normally and that's why she doesn't know what month it is      General Comments General comments (skin integrity, edema, etc.): only able to maintain SLS for 3-4 seconds bilaterally, needed MinA to maintain balance during tandem gait    Exercises     Assessment/Plan    PT Assessment All further PT needs can be met in the next venue of care  PT Problem List Decreased balance;Decreased coordination       PT Treatment Interventions      PT Goals (Current goals can be found in the Care Plan section)  Acute Rehab PT Goals Patient Stated Goal: go home PT Goal Formulation: With patient Time For Goal Achievement: 11/13/20 Potential to Achieve Goals: Good    Frequency     Barriers to discharge        Co-evaluation               AM-PAC PT "6 Clicks" Mobility  Outcome Measure Help needed turning from your back to your side while in a flat bed without using bedrails?: None Help needed moving from lying on your back to sitting on the side of a flat bed without using bedrails?: None Help needed moving to and from a bed to a chair (including a wheelchair)?: None Help needed standing up from a chair using your arms (e.g., wheelchair or bedside chair)?: None Help needed to walk in hospital room?: None Help needed climbing 3-5 steps with a railing? : None 6 Click Score: 24    End of Session   Activity Tolerance: Patient tolerated treatment well Patient left: in bed;with call bell/phone within reach Nurse Communication: Mobility status PT Visit Diagnosis: Unsteadiness on feet (R26.81)    Time: 3491-7915 PT Time Calculation (min) (ACUTE ONLY): 12 min   Charges:   PT Evaluation $PT Eval Moderate Complexity: 1 Mod         Windell Norfolk, DPT, PN1   Supplemental Physical Therapist [place]    Pager [phone] Acute Rehab Office [phone]

## 2020-10-30 NOTE — Progress Notes (Addendum)
STROKE TEAM PROGRESS NOTE   SUBJECTIVE (INTERVAL HISTORY) Marisa Nunez has tested positive for COVID but is asymptomatic. Affect is light. Neurologically, she has returned to baseline. Her daughter is at the bedside.  MRI shows a small punctate left frontal embolic infarct.  CT angiogram showed no significant large vessel extra or intracranial stenosis.  LDL cholesterol 70 mg percent.  Hemoglobin A1c is 5.6.  Echocardiogram is pending  OBJECTIVE Vitals:   10/29/20 2330 10/30/20 0725 10/30/20 0848 10/30/20 1000  BP: (!) 150/83 (!) 164/97  (!) 165/107  Pulse: 69 67  68  Resp: 15 15  15   Temp:   98.3 F (36.8 C)   TempSrc:   Oral   SpO2: 99% 98%  100%  Weight:      Height:        CBC:  Recent Labs  Lab 10/29/20 1310 10/29/20 1318  WBC 8.5  --   NEUTROABS 7.0  --   HGB 12.0 12.2  HCT 35.0* 36.0  MCV 87.7  --   PLT 359  --     Basic Metabolic Panel:  Recent Labs  Lab 10/29/20 1310 10/29/20 1311 10/29/20 1318 10/30/20 0703  NA 137  --  137 132*  K 2.5*  --  2.3* 2.4*  CL 97*  --  94* 95*  CO2 29  --   --  25  GLUCOSE 128*  --  127* 101*  BUN 10  --  9 8  CREATININE 0.92  --  0.90 0.80  CALCIUM 8.6*  --   --  8.9  MG  --  1.4*  --   --     Lipid Panel:  Recent Labs  Lab 10/30/20 0015  CHOL 202*  TRIG 70  HDL 88  CHOLHDL 2.3  VLDL 14  LDLCALC 100*   HgbA1c:  Lab Results  Component Value Date   HGBA1C 5.6 10/30/2020   Urine Drug Screen: No results found for: LABOPIA, COCAINSCRNUR, LABBENZ, AMPHETMU, THCU, LABBARB  Alcohol Level No results found for: [facility]  IMAGING  Results for orders placed or performed during the hospital encounter of 10/29/20  MR BRAIN WO CONTRAST   Narrative   CLINICAL DATA:  Facial droop and slurred speech  EXAM: MRI HEAD WITHOUT CONTRAST  TECHNIQUE: Multiplanar, multiecho pulse sequences of the brain and surrounding structures were obtained without intravenous contrast.  COMPARISON:  None.  FINDINGS: Brain: Punctate  focus of abnormal diffusion restriction within the left frontal lobe (series 5, image 82). No other diffusion abnormality. No acute or chronic hemorrhage. There is multifocal hyperintense T2-weighted signal within the white matter. Generalized volume loss without a clear lobar predilection. Old right cerebellar small vessel infarct. The midline structures are normal.  Vascular: Major flow voids are preserved.  Skull and upper cervical spine: Normal calvarium and skull base. Visualized upper cervical spine and soft tissues are normal.  Sinuses/Orbits:No paranasal sinus fluid levels or advanced mucosal thickening. No mastoid or middle ear effusion. Normal orbits.  IMPRESSION: 1. Punctate focus of acute ischemia within the left frontal lobe. No hemorrhage or mass effect. 2. Findings of chronic ischemic microangiopathy and generalized volume loss.   Electronically Signed   By: Ulyses Jarred M.D.   On: 10/29/2020 20:08     PHYSICAL EXAM  General exam   HEENT-  Normocephalic, no lesions, without obvious abnormality.  Normal external eye and conjunctiva.   Cardiovascular- RRR Lungs-Breathing comfortably on room air Abdomen-soft Musculoskeletal-no joint tenderness, deformity or swelling Skin-warm and dry,  no hyperpigmentation, vitiligo, or suspicious lesions  Neurologic Exam: General: Pleasant elderly Caucasian lady not in distress. Mental Status: Alert, oriented, thought content appropriate.  Speech fluent without evidence of aphasia.  Able to follow 3 step commands without difficulty. Cranial Nerves: II:  Visual fields grossly normal, pupils equal, round, reactive to light and accommodation III,IV, VI: ptosis not present, extra-ocular motions intact bilaterally V,VII: smile symmetric, facial light touch sensation normal bilaterally VIII: hearing intact bilaterally IX,X: uvula rises symmetrically XI: bilateral shoulder shrug 5/5 bilaterally XII: midline tongue extension  without atrophy or fasciculations  Motor: Right : Upper extremity   5/5    Left:     Upper extremity   5/5  Lower extremity   5/5     Lower extremity   5/5 Tone and bulk:normal tone throughout; no atrophy noted Sensory: Light touch intact throughout, bilaterally Cerebellar: normal finger-to-nose,  normal heel-to-shin test Gait: Deferred   ASSESSMENT/PLAN Marisa Nunez is a 75 y.o. female with a past medical history significant for tobacco abuse, stroke and dementia who presented [facility] yesterday afternoon following an abrupt change in speech (slurring) and left facial droop while walking her dog. Her symptoms cleared on EMS arrival with a recurrence during EMS evaluation. On arrival, CT head was negative for LVO but MRI revealed a punctate focus of acute ischemia within the left frontal lobe.   # CVA: Punctate focus of acute ischemia within the left frontal lobe likely of embolic etiology.  Cryptogenic source At this time, Marisa Nunez is neurologically back to baseline and has had no further recurrence of symptoms. Neurology will sign off with the completion of the workup. 2D Echo completed. No significant abnormalities. LDL: 100 - statin started today. Strongly suggest this is continued at home. HgbA1c 5.6 Outpatient loop recorder Continue 81mg  ASA and 75mg  Plavix x 21days followed by 81mg  ASA daily. Ongoing aggressive stroke risk factor management: smoking cessation and HTN control  Hypertension Permissive hypertension (OK if < 220/120) but gradually normalize in 5-7 days Long-term BP goal normotensive  Hyperlipidemia LDL 100, goal < 70 Continue statin at discharge  Hospital day # [place], PhD, PA-C Stroke [phone]  I have personally obtained history,examined this patient, reviewed notes, independently viewed imaging studies, participated in medical decision making and plan of care.ROS completed by me personally and pertinent positives fully documented  I have  made any additions or clarifications directly to the above note. Agree with note above.  Patient presented with sudden onset of expressive aphasia and MRI confirms a small embolic left frontal infarct etiology likely cryptogenic.  Patient counseled to quit smoking.  Dual antiplatelet therapy of aspirin and Plavix for 3 weeks followed by aspirin alone.  Aggressive risk factor modification.  Outpatient loop recorder placement for paroxysmal A. fib.  Follow-up as an outpatient stroke clinic in 6 weeks.  Long discussion with the patient and daughter and EP team nurse practitioner and answered questions.  Greater than 50% time during this 35-minute visit was spent on counseling and coordination of care and in discussion with care team.  Antony Contras, MD Medical Director [place] Pager: [phone] 10/30/2020 5:46 PM  To contact Stroke Continuity provider, please refer to [email or web address] After hours, contact General Neurology

## 2020-10-30 NOTE — Discharge Instructions (Addendum)
Marisa Nunez You were admitted to [facility] for a stroke. Your symptoms with difficulty speaking and other symptoms after the stroke have resolved. You will be started on an anticoagulant medication to try and prevent a new stroke from happening. You will continue taking your home medications with the following changes.  Start taking eliquis 5 mg two times daily  Please follow up with your new primary care provider at [facility] in about 1 week.

## 2020-10-30 NOTE — ED Notes (Signed)
Notified echo that pt's d/c is pending echo being read.

## 2020-10-30 NOTE — ED Notes (Signed)
Placed Breakfast Order 

## 2020-11-01 ENCOUNTER — Telehealth: Payer: Self-pay

## 2020-11-01 NOTE — Telephone Encounter (Signed)
-----   Message from Scarlett Presto, MD sent at 10/30/2020  1:59 PM EDT ----- Regarding: Hospital Follow Up/Establish Care To the [place], Please Schedule Ms. Dondlinger for a follow up appointment to establish care in 1 week. Thanks, Alexa

## 2020-11-01 NOTE — Telephone Encounter (Signed)
TOC HFU appointment 11/08/2020 at 10:45 am with Dr. Jeanice Lim.

## 2020-11-08 ENCOUNTER — Other Ambulatory Visit: Payer: Self-pay

## 2020-11-08 ENCOUNTER — Ambulatory Visit (INDEPENDENT_AMBULATORY_CARE_PROVIDER_SITE_OTHER): Payer: Medicare Other | Admitting: Internal Medicine

## 2020-11-08 DIAGNOSIS — G459 Transient cerebral ischemic attack, unspecified: Secondary | ICD-10-CM

## 2020-11-08 NOTE — Progress Notes (Signed)
CC: Follow-up Hospital visit post hospitalization on July 4 for TIA  HPI:  Marisa Nunez is a 75 y.o. female with a past medical history stated below and presents today for follow-up visit.  [name] is accompanied by her daughter.  [name] was hospitalized on October 29, 2020 for symptoms of right side drooping face.  She was found to have a transient ischemic attack.  At that time on labs potassium was 2.4 and sodium was 132.  Echocardiogram revealed ejection fraction 60%.  EKG shows normal sinus rhythm with minimal ST depression.  CT angiogram of the head and neck shows no large vessel occlusion, common and internal iliac occlusion less than 50%.  She was started on Eliquis 5 mg twice daily and potassium chloride 10 MEQ daily.  Today she presents with no complaints.  She denies experiencing any similar symptoms since discharge.  She denies any bruises or bleeding.  She is adherent to her current medication regimen.  She has an upcoming neurology appointment in August.    Please see problem based assessment and plan for additional details.  Past Medical History:  Diagnosis Date   Cancer ([place])    COPD (chronic obstructive pulmonary disease) ([place])    Dementia ([place])    Stroke ([place])     Current Outpatient Medications on File Prior to Visit  Medication Sig Dispense Refill   potassium chloride (KLOR-CON) 10 MEQ tablet Take 10 mEq by mouth 2 (two) times daily.     apixaban (ELIQUIS) 5 MG TABS tablet Take 1 tablet (5 mg total) by mouth 2 (two) times daily. 60 tablet 0   atorvastatin (LIPITOR) 80 MG tablet Take 1 tablet (80 mg total) by mouth daily. 30 tablet 0   buPROPion (WELLBUTRIN) 75 MG tablet Take 75 mg by mouth 2 (two) times daily.     chlorthalidone (HYGROTON) 25 MG tablet Take 25 mg by mouth daily.     losartan (COZAAR) 50 MG tablet Take 50 mg by mouth daily.     memantine (NAMENDA) 10 MG tablet Take 10 mg by mouth in the morning.     metoprolol succinate (TOPROL-XL) 50 MG 24 hr  tablet Take 50 mg by mouth daily. Take with or immediately following a meal.     sertraline (ZOLOFT) 50 MG tablet Take 50 mg by mouth daily.     No current facility-administered medications on file prior to visit.    No family history on file.  Social History   Socioeconomic History   Marital status: Unknown    Spouse name: Not on file   Number of children: Not on file   Years of education: Not on file   Highest education level: Not on file  Occupational History   Not on file  Tobacco Use   Smoking status: Every Day    Packs/day: 0.50    Types: Cigarettes   Smokeless tobacco: Not on file  Substance and Sexual Activity   Alcohol use: Yes    Alcohol/week: 6.0 standard drinks    Types: 6 Cans of beer per week   Drug use: Not on file   Sexual activity: Not on file  Other Topics Concern   Not on file  Social History Narrative   Not on file   Social Determinants of Health   Financial Resource Strain: Not on file  Food Insecurity: Not on file  Transportation Needs: Not on file  Physical Activity: Not on file  Stress: Not on file  Social Connections: Not  on file  Intimate Partner Violence: Not on file    Review of Systems  Constitutional:  Negative for chills and fever.  Eyes:  Negative for blurred vision.  Respiratory:  Negative for shortness of breath and wheezing.   Cardiovascular:  Negative for chest pain, palpitations and leg swelling.  Gastrointestinal:  Negative for abdominal pain, constipation, diarrhea, nausea and vomiting.  Neurological:  Negative for dizziness and headaches.  Endo/Heme/Allergies:  Does not bruise/bleed easily.   Vitals:   11/08/20 1124  BP: 117/67  Pulse: 71  Temp: 98.2 F (36.8 C)  TempSrc: Oral  SpO2: 100%  Weight: 129 lb 6.4 oz (58.7 kg)  Height: 5' 4.5" (1.638 m)     Physical Exam Constitutional:      Appearance: Normal appearance.  HENT:     Head: Normocephalic and atraumatic.  Cardiovascular:     Rate and Rhythm: Normal  rate and regular rhythm.     Heart sounds: Normal heart sounds, S1 normal and S2 normal. No murmur heard.   No gallop.  Pulmonary:     Effort: Pulmonary effort is normal.     Breath sounds: Normal breath sounds and air entry.  Abdominal:     General: Bowel sounds are normal.     Palpations: Abdomen is soft.     Tenderness: There is no abdominal tenderness. There is no guarding.  Skin:    General: Skin is warm and dry.  Neurological:     Mental Status: She is alert and oriented to person, place, and time.  Psychiatric:        Attention and Perception: Attention normal.        Mood and Affect: Mood normal.        Speech: Speech normal.        Behavior: Behavior normal. Behavior is cooperative.     Assessment & Plan:   See Encounters Tab for problem based charting.  Patient seen with Dr. Rachael Fee, M.D. [facility], PGY-1 Pager: [phone], Phone: [phone] Date 11/08/2020 Time 1:08 PM

## 2020-11-08 NOTE — Patient Instructions (Signed)
Follow up in 3 months

## 2020-11-08 NOTE — Assessment & Plan Note (Signed)
Marisa Nunez experienced a TIA prompting her to the hospitalized on October 29, 2020.  He experienced right side drooping.  He was prescribed Eliquis 5 mg twice daily.  Currently Marisa Nunez feels well.  Marisa Nunez denies any similar symptoms since discharge.  Marisa Nunez is adherent to her current medication regimen.  Marisa Nunez denies any bruising or bleeding since beginning Eliquis.  No further complaints.  PLAN: Marisa Nunez has an upcoming neurology appointment in August and Marisa Nunez plans to be present for the appointment. Follow-up in October.

## 2020-11-19 ENCOUNTER — Encounter: Payer: Self-pay | Admitting: Internal Medicine

## 2020-11-19 ENCOUNTER — Other Ambulatory Visit: Payer: Self-pay | Admitting: Internal Medicine

## 2020-11-19 DIAGNOSIS — E876 Hypokalemia: Secondary | ICD-10-CM

## 2020-11-19 NOTE — Progress Notes (Signed)
I called Ms. Marisa Nunez today to verify if she is taking her prescribed Kcl medication. She endorses that she is taking KCl daily since ED on 10/29/20. She acknowledges upcoming lab only visit this week for BMP and magnesium check.

## 2020-11-25 ENCOUNTER — Other Ambulatory Visit: Payer: Self-pay | Admitting: Internal Medicine

## 2020-11-27 ENCOUNTER — Other Ambulatory Visit: Payer: Medicare Other

## 2020-11-27 DIAGNOSIS — E876 Hypokalemia: Secondary | ICD-10-CM

## 2020-11-28 LAB — BMP8+ANION GAP
Anion Gap: 18 mmol/L (ref 10.0–18.0)
BUN/Creatinine Ratio: 7 — ABNORMAL LOW (ref 12–28)
BUN: 9 mg/dL (ref 8–27)
CO2: 23 mmol/L (ref 20–29)
Calcium: 9.5 mg/dL (ref 8.7–10.3)
Chloride: 91 mmol/L — ABNORMAL LOW (ref 96–106)
Creatinine, Ser: 1.29 mg/dL — ABNORMAL HIGH (ref 0.57–1.00)
Glucose: 115 mg/dL — ABNORMAL HIGH (ref 65–99)
Potassium: 3 mmol/L — ABNORMAL LOW (ref 3.5–5.2)
Sodium: 132 mmol/L — ABNORMAL LOW (ref 134–144)
eGFR: 44 mL/min/{1.73_m2} — ABNORMAL LOW (ref >59)

## 2020-11-28 LAB — MAGNESIUM: Magnesium: 1.3 mg/dL — ABNORMAL LOW (ref 1.6–2.3)

## 2020-11-30 ENCOUNTER — Institutional Professional Consult (permissible substitution): Payer: Medicare Other | Admitting: Cardiology

## 2020-12-06 NOTE — Progress Notes (Signed)
Internal Medicine Clinic Attending  I saw and evaluated the patient.  I personally confirmed the key portions of the history and exam documented by Dr. Ariwodo and I reviewed pertinent patient test results.  The assessment, diagnosis, and plan were formulated together and I agree with the documentation in the resident's note.   

## 2020-12-07 ENCOUNTER — Other Ambulatory Visit: Payer: Self-pay

## 2020-12-07 MED ORDER — BUPROPION HCL 75 MG PO TABS: 75.0000 mg | ORAL_TABLET | Freq: Two times a day (BID) | ORAL | 2 refills | Status: DC

## 2020-12-07 MED ORDER — METOPROLOL SUCCINATE ER 50 MG PO TB24: 50.0000 mg | ORAL_TABLET | Freq: Every day | ORAL | 2 refills | Status: DC

## 2020-12-07 MED ORDER — CHLORTHALIDONE 25 MG PO TABS: 25.0000 mg | ORAL_TABLET | Freq: Every day | ORAL | 3 refills | Status: DC

## 2020-12-07 MED ORDER — MEMANTINE HCL 10 MG PO TABS: 10.0000 mg | ORAL_TABLET | Freq: Every morning | ORAL | 0 refills | Status: DC

## 2020-12-07 MED ORDER — POTASSIUM CHLORIDE CRYS ER 10 MEQ PO TBCR: 10.0000 meq | EXTENDED_RELEASE_TABLET | Freq: Two times a day (BID) | ORAL | 2 refills | Status: DC

## 2020-12-07 MED ORDER — LOSARTAN POTASSIUM 50 MG PO TABS: 50.0000 mg | ORAL_TABLET | Freq: Every day | ORAL | 3 refills | Status: DC

## 2020-12-07 MED ORDER — SERTRALINE HCL 50 MG PO TABS: 50.0000 mg | ORAL_TABLET | Freq: Every day | ORAL | 0 refills | Status: DC

## 2020-12-07 MED ORDER — APIXABAN 5 MG PO TABS: 5.0000 mg | ORAL_TABLET | Freq: Two times a day (BID) | ORAL | 2 refills | Status: DC

## 2020-12-07 NOTE — Telephone Encounter (Signed)
REQUESTING ALL MEDS TO BE FILLED @   [facility] [id number] - [place], [address] AT [address].

## 2021-02-08 ENCOUNTER — Encounter: Payer: Self-pay | Admitting: *Deleted

## 2021-02-08 NOTE — Progress Notes (Signed)

## 2021-02-11 NOTE — Progress Notes (Signed)
Things That May Be Affecting Your Health:  Alcohol  Hearing loss  Pain    Depression  Home Safety  Sexual Health   Diabetes  Lack of physical activity  Stress   Difficulty with daily activities  Loneliness  Tiredness   Drug use  Medicines  Tobacco use   Falls  Motor Vehicle Safety  Weight   Food choices  Oral Health X Other    YOUR PERSONALIZED HEALTH PLAN : 1. Schedule your next subsequent Medicare Wellness visit in one year 2. Attend all of your regular appointments to address your medical issues 3. Complete the preventative screenings and services   Annual Wellness Visit   Medicare Covered Preventative Screenings and [place] Men and Women Who How Often Need? Date of Last Service Action  Abdominal Aortic Aneurysm Adults with AAA risk factors Once X     Alcohol Misuse and Counseling All Adults Screening once a year if no alcohol misuse. Counseling up to 4 face to face sessions.     Bone Density Measurement  Adults at risk for osteoporosis Once every 2 yrs      Lipid Panel Z13.6 All adults without CV disease Once every 5 yrs       Colorectal Cancer  Stool sample or Colonoscopy All adults 74 and older  Once every year Every 10 years        Depression All Adults Once a year  Today   Diabetes Screening Blood glucose, post glucose load, or GTT Z13.1 All adults at risk Pre-diabetics Once per year Twice per year X     Diabetes  Self-Management Training All adults Diabetics 10 hrs first year; 2 hours subsequent years. Requires Copay     Glaucoma Diabetics Family history of glaucoma African Americans 59 yrs + Hispanic Americans 55 yrs + Annually - requires coppay      Hepatitis C Z72.89 or F19.20 High Risk for HCV Born between 1945 and 1965 Annually Once X     HIV Z11.4 All adults based on risk Annually btw ages 1 & 80 regardless of risk Annually > 65 yrs if at increased risk X     Lung Cancer Screening Asymptomatic adults aged 51-77 with 54  pack yr history and current smoker OR quit within the last 15 yrs Annually Must have counseling and shared decision making documentation before first screen      Medical Nutrition Therapy Adults with  Diabetes Renal disease Kidney transplant within past 3 yrs 3 hours first year; 2 hours subsequent years     Obesity and Counseling All adults Screening once a year Counseling if BMI 30 or higher  Today   Tobacco Use Counseling Adults who use tobacco  Up to 8 visits in one year     Vaccines Z23 Hepatitis B Influenza  Pneumonia  Adults  Once Once every flu season Two different vaccines separated by one year     Next Annual Wellness Visit People with Medicare Every year  Today     Services & Screenings Women Who How Often Need  Date of Last Service Action  Mammogram  Z12.31 Women over 73 One baseline ages 27-39. Annually ager 40 yrs+      Pap tests All women Annually if high risk. Every 2 yrs for normal risk women      Screening for cervical cancer with  Pap (Z01.419 nl or Z01.411abnl) & HPV Z11.51 Women aged 66 to 12 Once every 5 yrs  Screening pelvic and breast exams All women Annually if high risk. Every 2 yrs for normal risk women     Sexually Transmitted Diseases Chlamydia Gonorrhea Syphilis All at risk adults Annually for non pregnant females at increased risk         [place] Men Who How Ofter Need  Date of Last Service Action  Prostate Cancer - DRE & PSA Men over 50 Annually.  DRE might require a copay.        Sexually Transmitted Diseases Syphilis All at risk adults Annually for men at increased risk      Health Maintenance List Health Maintenance  Topic Date Due   COVID-19 Vaccine (1) Never done   Hepatitis C Screening  Never done   TETANUS/TDAP  Never done   Zoster Vaccines- Shingrix (1 of 2) Never done   COLONOSCOPY (Pts 45-78yrs Insurance coverage will need to be confirmed)  Never done   MAMMOGRAM  Never done   DEXA SCAN  Never done    INFLUENZA VACCINE  Never done   HPV VACCINES  Aged Out

## 2021-03-01 ENCOUNTER — Other Ambulatory Visit: Payer: Self-pay | Admitting: Student

## 2021-05-22 ENCOUNTER — Other Ambulatory Visit: Payer: Self-pay

## 2021-05-22 ENCOUNTER — Encounter: Payer: Self-pay | Admitting: Student

## 2021-05-22 ENCOUNTER — Ambulatory Visit (INDEPENDENT_AMBULATORY_CARE_PROVIDER_SITE_OTHER): Payer: Medicare Other | Admitting: Student

## 2021-05-22 VITALS — BP 127/55 | HR 63 | Temp 97.8°F | Ht 64.0 in | Wt 135.9 lb

## 2021-05-22 DIAGNOSIS — I6300 Cerebral infarction due to thrombosis of unspecified precerebral artery: Secondary | ICD-10-CM

## 2021-05-22 DIAGNOSIS — N898 Other specified noninflammatory disorders of vagina: Secondary | ICD-10-CM

## 2021-05-22 DIAGNOSIS — R350 Frequency of micturition: Secondary | ICD-10-CM

## 2021-05-22 DIAGNOSIS — L900 Lichen sclerosus et atrophicus: Secondary | ICD-10-CM | POA: Diagnosis not present

## 2021-05-22 DIAGNOSIS — N3281 Overactive bladder: Secondary | ICD-10-CM

## 2021-05-22 DIAGNOSIS — E876 Hypokalemia: Secondary | ICD-10-CM | POA: Diagnosis not present

## 2021-05-22 DIAGNOSIS — E871 Hypo-osmolality and hyponatremia: Secondary | ICD-10-CM

## 2021-05-22 NOTE — Patient Instructions (Signed)
For your itching it is unclear what exactly is causing it. We will refer you to gynecology so they can better examine it.

## 2021-05-23 LAB — URINALYSIS, ROUTINE W REFLEX MICROSCOPIC
Bilirubin, UA: NEGATIVE
Glucose, UA: NEGATIVE
Ketones, UA: NEGATIVE
Leukocytes,UA: NEGATIVE
Nitrite, UA: NEGATIVE
Protein,UA: NEGATIVE
RBC, UA: NEGATIVE
Specific Gravity, UA: 1.009 (ref 1.005–1.030)
Urobilinogen, Ur: 0.2 mg/dL (ref 0.2–1.0)
pH, UA: 8 — ABNORMAL HIGH (ref 5.0–7.5)

## 2021-05-23 LAB — BMP8+ANION GAP
Anion Gap: 17 mmol/L (ref 10.0–18.0)
BUN/Creatinine Ratio: 11 — ABNORMAL LOW (ref 12–28)
BUN: 8 mg/dL (ref 8–27)
CO2: 25 mmol/L (ref 20–29)
Calcium: 9.7 mg/dL (ref 8.7–10.3)
Chloride: 86 mmol/L — ABNORMAL LOW (ref 96–106)
Creatinine, Ser: 0.72 mg/dL (ref 0.57–1.00)
Glucose: 105 mg/dL — ABNORMAL HIGH (ref 70–99)
Potassium: 3.4 mmol/L — ABNORMAL LOW (ref 3.5–5.2)
Sodium: 128 mmol/L — ABNORMAL LOW (ref 134–144)
eGFR: 88 mL/min/{1.73_m2} (ref >59)

## 2021-05-24 ENCOUNTER — Encounter: Payer: Self-pay | Admitting: Student

## 2021-05-24 ENCOUNTER — Telehealth: Payer: Self-pay | Admitting: *Deleted

## 2021-05-24 DIAGNOSIS — L900 Lichen sclerosus et atrophicus: Secondary | ICD-10-CM | POA: Insufficient documentation

## 2021-05-24 DIAGNOSIS — E876 Hypokalemia: Secondary | ICD-10-CM | POA: Insufficient documentation

## 2021-05-24 DIAGNOSIS — E871 Hypo-osmolality and hyponatremia: Secondary | ICD-10-CM | POA: Insufficient documentation

## 2021-05-24 DIAGNOSIS — N3281 Overactive bladder: Secondary | ICD-10-CM | POA: Insufficient documentation

## 2021-05-24 DIAGNOSIS — N898 Other specified noninflammatory disorders of vagina: Secondary | ICD-10-CM | POA: Insufficient documentation

## 2021-05-24 MED ORDER — MIRABEGRON ER 50 MG PO TB24: 50.0000 mg | ORAL_TABLET | Freq: Every day | ORAL | 1 refills | Status: DC

## 2021-05-24 NOTE — Assessment & Plan Note (Addendum)
Patient reports for the last year or so she has been experiencing urinary urgency. Patient feels that she completely empties her bladder but notes that she still has the urge to urinate. When she does go back to the bathroom she will have little to no urine output. She states she does frequently drink water although patient's daughter states that patient has to be repeatedly prompted to eat and drink. Patient does not have any vaginal dryness, she did complain of vaginal itching and thought she had a UTI but her UA this clinic visit is unremarkable for infection. Patient did have a vaginal lesion at the inferior portion of her introitus but this is unlikely to be causing her symptoms. She does note that the frequent trips to the bathroom are affecting her sleep.  A/P: Patient likely has overactive bladder. Discussed with patient and her daughter that the patient should limit water intake late in the evening.  -Prescribed patient myrbetriq to help with her symptoms.

## 2021-05-24 NOTE — Progress Notes (Signed)
° °  CC: 3 month follow up and for UTI symptoms  HPI:  [name] is a 76 y.o. F with PM per below who presents to clinic for three month follow up and for acute issues of UTI symptoms and urinary urgency prior to UTI symptoms. Please see problem based charting under encounters tab for further details.    Past Medical History:  Diagnosis Date   Cancer ([place])    COPD (chronic obstructive pulmonary disease) ([place])    Dementia ([facility])    Paroxysmal Afib    Stroke ([place])    Review of Systems:  Please see problem based charting under encounters tab for further details.    Physical Exam:  Vitals:   05/22/21 0920  BP: (!) 127/55  Pulse: 63  Temp: 97.8 F (36.6 C)  TempSrc: Oral  SpO2: 100%  Weight: 135 lb 14.4 oz (61.6 kg)  Height: 5\' 4"  (1.626 m)   Constitutional: Well-developed, well-nourished, and in no distress.  HENT:  Head: Normocephalic and atraumatic.  Eyes: EOM are normal.  Neck: Normal range of motion.  Cardiovascular: Normal rate, regular rhythm, intact distal pulses. No gallop and no friction rub.  No murmur heard. No lower extremity edema  Pulmonary: Non labored breathing on room air, no wheezing or rales  Abdominal: Soft. Normal bowel sounds. Non distended and non tender Musculoskeletal: Normal range of motion.        General: No tenderness or edema.  Neurological: Alert and oriented to person, place, and time. Non focal  Skin: Skin is warm and dry.  GU: Moist mucocsa, inferior portion of vagina at introitus on the R side with small whitish plaque   Assessment & Plan:   See Encounters Tab for problem based charting.  Patient discussed with Dr. Jimmye Norman

## 2021-05-24 NOTE — Assessment & Plan Note (Addendum)
Patient with persistent hypokalemia. Currently on a thiazide diuretic does not report any diarrhea. Has some urinary urgency. Currently on potassium supplementation. K this clinic visit 3.4.  -Continue PO supplementation, thiazide diuretic may need to be discontinued.  -If after discontinuation of thiazide would check for urinary losses  -Patient will follow up in 2 weeks for BMP

## 2021-05-24 NOTE — Telephone Encounter (Signed)
Patient's son-in-law called in stating med for over active bladder is not at Pharmacy.

## 2021-05-24 NOTE — Assessment & Plan Note (Addendum)
Patient noted to have hyponatremia on BMP which was checked for hypokalemia noted at 11/2020 clinic visit. Patient reports adequate water intake though patient does have dementia and her daughter reports that she frequently forgets to eat and drink without prompting. In addition, patient is on multiple medications that can cause hyponatremia including sertraline, buproprion, and chlorthalidone.  Unclear exact etiology of patient's hyponatremia but believe her decrease oral intake is contributing to this as well as her multiple medications that can cause this.  -Plan to have patient come back in 2 weeks for repeat BMP and consider changing her antihypertensives and antidepressants.

## 2021-05-24 NOTE — Assessment & Plan Note (Signed)
Patient presented with about 2-3 weeks of vaginal itching. She noted that the itching was intermittent and was on the outside of her vagina. She has had burning with urination but states that the burning occurs at the end of her stream and she feels this is because urine gets on the skin that she recently scratched. She has not noticed any discharge or bleeding. She also denies vaginal dryness. She did use monistat which briefly helped her symptoms. But otherwise denies any exacerbating or remitting factors. Patient also states that she has had nocturia. Her daughter who accompanies patient to this visit states that this preceded her vaginal itching. Patient states that she drinks water frequently. She feels she empties her bladder completely but she continues to get the urge to urinate. This has caused the patient to not sleep well.   On exam patient was noted to have a small whitish plaque at the inferior border of the vagina at the introitus. The lesions is non TTP. Her external skin was non erythematous and no lesions were noted.   A/P: Unclear exact etiology of patient's vaginal itching and the lesion noted on exam. This could be related to lichen sclerosus or genital warts. Discussed with patient that she should keep the vagina and surrounding areas clean. Also referred patient to gynecology to further work up this lesion.

## 2021-05-24 NOTE — Progress Notes (Signed)
Internal Medicine Clinic Attending  I saw and evaluated the patient.  I personally confirmed the key portions of the history and exam documented by Dr. Eulas Post and I reviewed pertinent patient test results.  The assessment, diagnosis, and plan were formulated together and I agree with the documentation in the residents note. Pt has flesh colored bumps at the vulvar fourchette.  HPV warts are on the differential - these can reactivate many years after the last eruption.  She doesn't have postmenopausal atrophy.

## 2021-05-25 ENCOUNTER — Other Ambulatory Visit: Payer: Self-pay | Admitting: Student

## 2021-06-12 ENCOUNTER — Observation Stay (HOSPITAL_COMMUNITY)
Admission: EM | Admit: 2021-06-12 | Discharge: 2021-06-14 | Disposition: A | Discharge status: Home / Self Care | Payer: Medicare Other | Attending: Emergency Medicine | Admitting: Emergency Medicine

## 2021-06-12 ENCOUNTER — Telehealth: Payer: Self-pay | Admitting: *Deleted

## 2021-06-12 ENCOUNTER — Other Ambulatory Visit: Payer: Self-pay

## 2021-06-12 DIAGNOSIS — N3281 Overactive bladder: Secondary | ICD-10-CM | POA: Insufficient documentation

## 2021-06-12 DIAGNOSIS — Z7901 Long term (current) use of anticoagulants: Secondary | ICD-10-CM | POA: Diagnosis not present

## 2021-06-12 DIAGNOSIS — D649 Anemia, unspecified: Secondary | ICD-10-CM | POA: Diagnosis not present

## 2021-06-12 DIAGNOSIS — J449 Chronic obstructive pulmonary disease, unspecified: Secondary | ICD-10-CM | POA: Diagnosis not present

## 2021-06-12 DIAGNOSIS — E871 Hypo-osmolality and hyponatremia: Secondary | ICD-10-CM | POA: Diagnosis not present

## 2021-06-12 DIAGNOSIS — I10 Essential (primary) hypertension: Secondary | ICD-10-CM | POA: Insufficient documentation

## 2021-06-12 DIAGNOSIS — Z20822 Contact with and (suspected) exposure to covid-19: Secondary | ICD-10-CM | POA: Insufficient documentation

## 2021-06-12 DIAGNOSIS — R441 Visual hallucinations: Secondary | ICD-10-CM | POA: Diagnosis present

## 2021-06-12 DIAGNOSIS — Z7982 Long term (current) use of aspirin: Secondary | ICD-10-CM | POA: Insufficient documentation

## 2021-06-12 DIAGNOSIS — Z8673 Personal history of transient ischemic attack (TIA), and cerebral infarction without residual deficits: Secondary | ICD-10-CM | POA: Insufficient documentation

## 2021-06-12 DIAGNOSIS — I480 Paroxysmal atrial fibrillation: Secondary | ICD-10-CM | POA: Insufficient documentation

## 2021-06-12 DIAGNOSIS — G9340 Encephalopathy, unspecified: Principal | ICD-10-CM | POA: Diagnosis present

## 2021-06-12 DIAGNOSIS — Z79899 Other long term (current) drug therapy: Secondary | ICD-10-CM | POA: Insufficient documentation

## 2021-06-12 DIAGNOSIS — E876 Hypokalemia: Secondary | ICD-10-CM | POA: Insufficient documentation

## 2021-06-12 DIAGNOSIS — T50905A Adverse effect of unspecified drugs, medicaments and biological substances, initial encounter: Secondary | ICD-10-CM

## 2021-06-12 DIAGNOSIS — F29 Unspecified psychosis not due to a substance or known physiological condition: Secondary | ICD-10-CM

## 2021-06-12 HISTORY — DX: Depression, unspecified: F32.A

## 2021-06-12 HISTORY — DX: Anxiety disorder, unspecified: F41.9

## 2021-06-12 LAB — COMPREHENSIVE METABOLIC PANEL
ALT: 20 U/L (ref 0–44)
AST: 22 U/L (ref 15–41)
Albumin: 3.7 g/dL (ref 3.5–5.0)
Alkaline Phosphatase: 126 U/L (ref 38–126)
Anion gap: 9 (ref 5–15)
BUN: 9 mg/dL (ref 8–23)
CO2: 26 mmol/L (ref 22–32)
Calcium: 9.1 mg/dL (ref 8.9–10.3)
Chloride: 96 mmol/L — ABNORMAL LOW (ref 98–111)
Creatinine, Ser: 0.72 mg/dL (ref 0.44–1.00)
GFR, Estimated: >60 mL/min (ref >60)
Glucose, Bld: 105 mg/dL — ABNORMAL HIGH (ref 70–99)
Potassium: 3.4 mmol/L — ABNORMAL LOW (ref 3.5–5.1)
Sodium: 131 mmol/L — ABNORMAL LOW (ref 135–145)
Total Bilirubin: 0.4 mg/dL (ref 0.3–1.2)
Total Protein: 6.2 g/dL — ABNORMAL LOW (ref 6.5–8.1)

## 2021-06-12 LAB — VITAMIN B12: Vitamin B-12: 239 pg/mL (ref 180–914)

## 2021-06-12 LAB — RAPID URINE DRUG SCREEN, HOSP PERFORMED
Amphetamines: NOT DETECTED
Barbiturates: NOT DETECTED
Benzodiazepines: NOT DETECTED
Cocaine: NOT DETECTED
Opiates: NOT DETECTED
Tetrahydrocannabinol: NOT DETECTED

## 2021-06-12 LAB — URINALYSIS, ROUTINE W REFLEX MICROSCOPIC
Bilirubin Urine: NEGATIVE
Glucose, UA: NEGATIVE mg/dL
Hgb urine dipstick: NEGATIVE
Ketones, ur: NEGATIVE mg/dL
Leukocytes,Ua: NEGATIVE
Nitrite: NEGATIVE
Protein, ur: NEGATIVE mg/dL
Specific Gravity, Urine: 1.008 (ref 1.005–1.030)
pH: 6 (ref 5.0–8.0)

## 2021-06-12 LAB — CBC WITH DIFFERENTIAL/PLATELET
Abs Immature Granulocytes: 0.06 10*3/uL (ref 0.00–0.07)
Basophils Absolute: 0.1 10*3/uL (ref 0.0–0.1)
Basophils Relative: 1 %
Eosinophils Absolute: 0.2 10*3/uL (ref 0.0–0.5)
Eosinophils Relative: 2 %
HCT: 35.8 % — ABNORMAL LOW (ref 36.0–46.0)
Hemoglobin: 11.9 g/dL — ABNORMAL LOW (ref 12.0–15.0)
Immature Granulocytes: 1 %
Lymphocytes Relative: 10 %
Lymphs Abs: 1.1 10*3/uL (ref 0.7–4.0)
MCH: 28.3 pg (ref 26.0–34.0)
MCHC: 33.2 g/dL (ref 30.0–36.0)
MCV: 85.2 fL (ref 80.0–100.0)
Monocytes Absolute: 1 10*3/uL (ref 0.1–1.0)
Monocytes Relative: 9 %
Neutro Abs: 8.9 10*3/uL — ABNORMAL HIGH (ref 1.7–7.7)
Neutrophils Relative %: 77 %
Platelets: 349 10*3/uL (ref 150–400)
RBC: 4.2 MIL/uL (ref 3.87–5.11)
RDW: 13.7 % (ref 11.5–15.5)
WBC: 11.3 10*3/uL — ABNORMAL HIGH (ref 4.0–10.5)
nRBC: 0 % (ref 0.0–0.2)

## 2021-06-12 LAB — RESP PANEL BY RT-PCR (FLU A&B, COVID) ARPGX2
Influenza A by PCR: NEGATIVE
Influenza B by PCR: NEGATIVE
SARS Coronavirus 2 by RT PCR: NEGATIVE

## 2021-06-12 LAB — OSMOLALITY: Osmolality: 266 mOsm/kg — ABNORMAL LOW (ref 275–295)

## 2021-06-12 LAB — TSH: TSH: 2.353 u[IU]/mL (ref 0.350–4.500)

## 2021-06-12 LAB — AMMONIA: Ammonia: 11 umol/L (ref 9–35)

## 2021-06-12 LAB — MAGNESIUM: Magnesium: 1.5 mg/dL — ABNORMAL LOW (ref 1.7–2.4)

## 2021-06-12 LAB — ETHANOL: Alcohol, Ethyl (B): <10 mg/dL (ref <10)

## 2021-06-12 MED ORDER — POTASSIUM CHLORIDE CRYS ER 10 MEQ PO TBCR: 10.0000 meq | EXTENDED_RELEASE_TABLET | Freq: Two times a day (BID) | ORAL | Status: DC

## 2021-06-12 MED ORDER — ENOXAPARIN SODIUM 40 MG/0.4ML IJ SOSY: 40.0000 mg | PREFILLED_SYRINGE | INTRAMUSCULAR | Status: DC

## 2021-06-12 MED ORDER — MAGNESIUM SULFATE 2 GM/50ML IV SOLN
2.0000 g | Freq: Once | INTRAVENOUS | Status: AC
  Administered 2021-06-13: 2 g via INTRAVENOUS
  Filled 2021-06-12: qty 50

## 2021-06-12 MED ORDER — ATORVASTATIN CALCIUM 80 MG PO TABS
80.0000 mg | ORAL_TABLET | Freq: Every day | ORAL | Status: DC
  Administered 2021-06-13 – 2021-06-14 (×2): 80 mg via ORAL
  Filled 2021-06-12 (×2): qty 1
  Filled 2021-06-12: qty 2

## 2021-06-12 MED ORDER — ZIPRASIDONE MESYLATE 20 MG IM SOLR
20.0000 mg | Freq: Once | INTRAMUSCULAR | Status: AC
  Administered 2021-06-12: 20 mg via INTRAMUSCULAR
  Filled 2021-06-12: qty 20

## 2021-06-12 MED ORDER — METOPROLOL SUCCINATE ER 50 MG PO TB24
50.0000 mg | ORAL_TABLET | Freq: Every day | ORAL | Status: DC
  Administered 2021-06-13 – 2021-06-14 (×2): 50 mg via ORAL
  Filled 2021-06-12: qty 2
  Filled 2021-06-12 (×2): qty 1

## 2021-06-12 MED ORDER — APIXABAN 5 MG PO TABS
5.0000 mg | ORAL_TABLET | Freq: Two times a day (BID) | ORAL | Status: DC
  Administered 2021-06-13 – 2021-06-14 (×3): 5 mg via ORAL
  Filled 2021-06-12 (×3): qty 1

## 2021-06-12 NOTE — ED Notes (Signed)
Pt is combative in the waiting room. Pt no longer wants to be by family.

## 2021-06-12 NOTE — Telephone Encounter (Signed)
Looks like there are post marketing reports of Myrbetriq causing hallicunations and insomnia, would have her stop taking this and come in on the 20th or shortly thereafter to see if alternative medication is needed and ensure this resolves these symptoms

## 2021-06-12 NOTE — ED Provider Triage Note (Signed)
Emergency Medicine Provider Triage Evaluation Note  Marisa Nunez , a 76 y.o. female  was evaluated in triage.  Pt complains of hallucination. Hx of dementia, has had increase auditory and visual hallucination for the past several days.  Just travel back from [facility] yesterday. Was started on a new medication for overactive bladder, daughter worries it may contribute to her sxs.    Review of Systems  Positive: AVH Negative: SI/HI, pain, headache, focal numbness/weakness  Physical Exam  LMP  (LMP Unknown)  Gen:   Awake, no distress   Resp:  Normal effort  MSK:   Moves extremities without difficulty  Other:    Medical Decision Making  Medically screening exam initiated at 10:38 AM.  Appropriate orders placed.  Marisa Nunez was informed that the remainder of the evaluation will be completed by another provider, this initial triage assessment does not replace that evaluation, and the importance of remaining in the ED until their evaluation is complete.     Domenic Moras, PA-C 06/12/21 1044

## 2021-06-12 NOTE — ED Triage Notes (Signed)
Daughter stated, she is having hallucinations that started on Monday. She has only had a couple hours of sleep now and then. She started on a new pill last Thursday for an over active bladder so not sure.

## 2021-06-12 NOTE — Telephone Encounter (Signed)
Patient's son-in-law called in stating patient started Myrbetriq on 2/9 and hasn't slept since 2/12 at 4 AM. Has been hallucinating (seeing deceased relatives) and has been combative. States she is calm right now but he needs to know what to do. He does not want her to take any more Myrbetriq. There are no openings in [facility] till 2/20. Please advise.

## 2021-06-12 NOTE — ED Provider Notes (Signed)
[facility] EMERGENCY DEPARTMENT Provider Note   CSN: [id number] Arrival date & time: 06/12/21  1010     History  Chief Complaint  Patient presents with   Hallucinations    Marisa Nunez is a 76 y.o. female.  The history is provided by the patient, a relative and medical records. No language interpreter was used.   This is a 76 year old female with significant history of dementia, hypertension, TIA, paroxysmal atrial fibrillation currently on Eliquis, overactive bladder brought in by daughter with concerns of hallucination.  Daughter mention for the past 2 days since driving back from [place] patient has been hearing voices and having visual hallucination.  States that she is more confused than her usual baseline.  Daughter also mentioned that patient recently was started on a new medication for overactive bladder approximately 5 days ago and unsure if that may contribute to her symptoms.  Denies any recent infection no report of runny nose sneezing or coughing.  Patient denies having any active pain.  No report of any facial changes having any focal numbness or focal weakness.  Home Medications Prior to Admission medications   Medication Sig Start Date End Date Taking? Authorizing Provider  apixaban (ELIQUIS) 5 MG TABS tablet Take 1 tablet (5 mg total) by mouth 2 (two) times daily. 12/07/20   Riesa Pope, MD  atorvastatin (LIPITOR) 80 MG tablet TAKE 1 TABLET(80 MG) BY MOUTH DAILY 11/27/20   Rick Duff, MD  buPROPion [facility]) 75 MG tablet Take 1 tablet (75 mg total) by mouth 2 (two) times daily. 12/07/20   Riesa Pope, MD  chlorthalidone (HYGROTON) 25 MG tablet Take 1 tablet (25 mg total) by mouth daily. 12/07/20   Katsadouros, Vasilios, MD  losartan (COZAAR) 50 MG tablet Take 1 tablet (50 mg total) by mouth daily. 12/07/20   Riesa Pope, MD  memantine (NAMENDA) 10 MG tablet Take 1 tablet (10 mg total) by mouth in the morning. 12/07/20    Riesa Pope, MD  metoprolol succinate (TOPROL-XL) 50 MG 24 hr tablet Take 1 tablet (50 mg total) by mouth daily. Take with or immediately following a meal. 12/07/20   Katsadouros, Vasilios, MD  mirabegron ER (MYRBETRIQ) 50 MG TB24 tablet Take 1 tablet (50 mg total) by mouth daily. 05/24/21 11/20/21  Rick Duff, MD  potassium chloride (KLOR-CON) 10 MEQ tablet Take 1 tablet (10 mEq total) by mouth 2 (two) times daily. 12/07/20   Riesa Pope, MD  sertraline (ZOLOFT) 50 MG tablet TAKE 1 TABLET(50 MG) BY MOUTH DAILY 05/28/21   Timothy Lasso, MD      Allergies    Patient has no known allergies.    Review of Systems   Review of Systems  Unable to perform ROS: Dementia   Physical Exam Updated Vital Signs BP (!) 175/72 (BP Location: Right Arm)    Pulse 74    Temp 98.2 F (36.8 C) (Oral)    Resp 18    LMP  (LMP Unknown)    SpO2 99%  Physical Exam Vitals and nursing note reviewed.  Constitutional:      General: She is not in acute distress.    Appearance: She is well-developed.     Comments: Patient resting comfortably in the chair, smiling, interactive, appears to be in no acute discomfort.  HENT:     Head: Atraumatic.  Eyes:     Conjunctiva/sclera: Conjunctivae normal.  Cardiovascular:     Rate and Rhythm: Normal rate and regular rhythm.     Pulses: Normal  pulses.     Heart sounds: Normal heart sounds.  Pulmonary:     Effort: Pulmonary effort is normal.  Abdominal:     Palpations: Abdomen is soft.  Musculoskeletal:     Cervical back: Neck supple.  Skin:    Findings: No rash.  Neurological:     Mental Status: She is alert.     GCS: GCS eye subscore is 4. GCS verbal subscore is 5. GCS motor subscore is 6.     Sensory: Sensation is intact.     Motor: Motor function is intact.     Comments: Alert and oriented x2.  She thought it was July however was corrected in the year and the current president.  Patient does not know which hospital she is at or why she is  here.  Psychiatric:        Mood and Affect: Mood normal.    ED Results / Procedures / Treatments   Labs (all labs ordered are listed, but only abnormal results are displayed) Labs Reviewed  COMPREHENSIVE METABOLIC PANEL - Abnormal; Notable for the following components:      Result Value   Sodium 131 (*)    Potassium 3.4 (*)    Chloride 96 (*)    Glucose, Bld 105 (*)    Total Protein 6.2 (*)    All other components within normal limits  CBC WITH DIFFERENTIAL/PLATELET - Abnormal; Notable for the following components:   WBC 11.3 (*)    Hemoglobin 11.9 (*)    HCT 35.8 (*)    Neutro Abs 8.9 (*)    All other components within normal limits  URINALYSIS, ROUTINE W REFLEX MICROSCOPIC - Abnormal; Notable for the following components:   APPearance HAZY (*)    All other components within normal limits  RESP PANEL BY RT-PCR (FLU A&B, COVID) ARPGX2  ETHANOL  RAPID URINE DRUG SCREEN, HOSP PERFORMED    EKG None  Radiology No results found.  Procedures Procedures    Medications Ordered in ED Medications  ziprasidone (GEODON) injection 20 mg (20 mg Intramuscular Given 06/12/21 1618)    ED Course/ Medical Decision Making/ A&P                           Medical Decision Making Amount and/or Complexity of Data Reviewed Labs: ordered.  Risk Prescription drug management. Decision regarding hospitalization.   BP (!) 175/72 (BP Location: Right Arm)    Pulse 74    Temp 98.2 F (36.8 C) (Oral)    Resp 18    LMP  (LMP Unknown)    SpO2 99%   4:44 PM Patient with history of dementia brought here for altered mental status with psychosis.  Patient exhibited auditory and visual hallucination.  She was recently started on a new medication for overactive bladder several days prior.  Initially patient was calm and resting comfortably however later on nurse notified that patient became very agitated and demanded to leave.  Due to worsening psychosis and concerns for safety and the need for  further work-up, IVC paper was filed.  Labs was obtained and independently interpreted by me.  UDS without any positive drug screen, urinalysis without signs of urinary tract infection, electrolyte panels at baseline and reassuring, negative alcohol level, mildly elevated white count of 11.3, nonspecific, her hemoglobin is 11.9.  5:43 PM Patient appears more calm but still pacing around the room.  She is more pleasant.  Suspect part of her psychosis may  be secondary to new medication mirabegron, however I have review side effect profile of said drug through UpToDate, but did not see any corresponding psych changes.    5:59 PM Appreciate consultation from Internal Medicine team who agrees to see pt in the ER and help with disposition.   6:56 PM Care discussed with Dr. Tyrone Nine who will f/u on recommendation of Internal Medicine team if pt does get admitted for further evaluation of her psychosis.         Final Clinical Impression(s) / ED Diagnoses Final diagnoses:  Psychosis, unspecified psychosis type [facility])    Rx / DC Orders ED Discharge Orders     None         Domenic Moras, PA-C 06/12/21 1856    Carmin Muskrat, MD 06/13/21 [id number]

## 2021-06-12 NOTE — ED Notes (Signed)
Pt pacing in the room, looking through the cabinets, pulling the code blue button. Pt is redirectable at this time. PA made aware.

## 2021-06-12 NOTE — Hospital Course (Addendum)
"  Just doesn't feel right" Has memory loss, family told her she should get checked out today.  Has felt off for about 1 month. She's used to doing things on her own but now she is more forgetful "it runs in my family." No recent illness. She thinks she had the flu a few weeks aho and had fever, chills at that time. Otherwise has been feeling well.  Talks about a friend who is going to get her soom books so she can read some more. Thinks its been a while since a physical and that she needs that now Felt like she had a fever, took temp and it was "low grade" and felt poorly. -n, +v/d. Diarrhea over last week or so. Talks a lot about her sister Doctor wants to put her on memory medications like memantine, etc. Feels good overall right now. Drives and lets family drive her. Headaches started since starting headaches for memory, last 4-5 months. No change. Frontal bilaterally. Come and go. Feels like someone hit her against the head--nothing strong but "just hurts" Has had vision changes. Again attributes to medication. [place]?? No shob, back pain,  Aggrivating chest pain over center chest, again medicine onset, dull, comes and goes, no alleviating factors, eating too much worsens it, movement independent. No numbness tingling.  Daughter stephanie (was calling her sister earlier as well), took her to doctor for physical today (??).  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 Does occasionally "doesn't inhale". Started at age [age], stopped 6 months ago or so. 4-5 cigarrettes per day. Occasional beer with the family, doesn't know how often it is. Sister buys her nonalcoholic bud lights. No marijuana, cocakine, meth, heroine. Partner, female, is sexually active,  uses condoms sometimes. Monogamous. No history stds.   Likes to garden and be outside a lot. No recent animal bites. Mosquito bite on leg a few days ago?

## 2021-06-12 NOTE — ED Notes (Signed)
Pt wanded by security. Pt's necklace in pt's wallet with rest of her belongings.

## 2021-06-12 NOTE — Telephone Encounter (Signed)
Returned call to [employer]. States last tab of Myrbetriq was yesterday AM. He wants to know what to do if she becomes combative again. States her pupils are pin point at present. Recommended he not wait and take her directly to ED, as this is concerning. He is in agreement. He will bring her to [facility] ED.

## 2021-06-12 NOTE — H&P (Addendum)
Date: 06/12/2021               Patient Name:  Marisa Nunez MRN: [id number]  DOB: 03-07-1946 Age / Sex: 76 y.o., female   PCP: Timothy Lasso, MD         Medical Service: Internal Medicine Teaching Service         Attending Physician: Dr. Lucious Groves, DO    First Contact: Timothy Lasso, MD Pager: SA [phone]  Second Contact: Sanjuana Letters, DO Pager: Maryjean Morn [phone]       After Hours (After 5p/  First Contact Pager: [phone]  weekends / holidays): Second Contact Pager: [phone]   SUBJECTIVE   Chief Complaint: Hallucinations   History of Present Illness: Marisa Nunez is a 76 year old female living with HTN,HLD, PAF, CVA (10/2020) and vascular dementia who presents with 2 days of visual and auditory hallucinations. Patient recently started on Myrbetriq for overactive bladder.   [name] is walking around the room pacing trying to use the phone.  She is redirectable and able to answer majority my questions.  She states that she is in the hospital for a checkup as she has felt off for the past month and a half.  She notes that this has been since she started medications for her Alzheimer's.  She is uncertain of what these specific medications are.  When asked how she feels off she is unable to explain how.  She notes recent illness a month ago where she felt feverish with chills and nausea.  She denies ever being evaluated for this and states that it gradually resolved.  Currently she denies any fevers or chills.  She does endorse a history of centralized chest pain that is central and dull.  It comes and goes without any alleviating factors.  At times eating too much worsens it.  It is independent with movement.  She denies any radiation of the pain.  Endorses frontal bilateral headaches for the past 4 to 5 months that have been associated with her difficulties with memory.  She does endorse vision changes however she believes that this associate with her medications.  When asked to  describe the pain she states it feels as though someone hit her in the head.  She feels that her memory is worsened and that dementia runs in her family.  Patient also denies any rashes or recent bug bites.  She states she is sexually active and monogamous with 1 female partner whom she uses protection with.  She denies any history of STIs.  She endorses feeling okay at this time but she is okay with staying in the hospital and having a physical done.  She currently believes that she is in [name] with [name].  When asked to Marisa Nunez is at times she will say that Marisa Nunez is her daughter and other times she states that Marisa Nunez is her sister.  HPI limited due to patient's acute cephalopathy.   Per patient's daughter Marisa Nunez she started [name] Thursday.  Over the next 24 hours would become angry over things like her close and per the daughter she was angry over nothing.  The patient and her family then traveled to [place] to attend a family member's funeral.  The patient then began having hallucinations,.  Examples of these hallucinations are that her family members were stealing her car and her money.  This continue to progress until last night when the patient became agitated and angry and hallucinated the point where the family  brought her to the emergency department.  Patient's family notes that they set out her medications and that in her pillbox there is no missing pills.  There is nothing else abnormal around the house that the family could contribute to her altered mental status.  Family note that patient has had an episode of agitation in the past but never to this degree.  This was many years ago prior to her starting her psychiatric medications.  Attempted to call patient's sister Marisa Nunez who is her healthcare power of attorney.  Patient states that she is full code however on previous admission she has been DNR patient's daughter notes that she has a DNR in place.  We  will attempt to reach patient's sister to confirm this.  ED Course:  Patient brought into the ED by family for altered mental status with psychosis.  Per the ED provider she was experiencing auditory and visual hallucinations after starting a new medication for her overactive bladder, Myrbetriq.  Since this time the patient has been very agitated per her daughter with hallucinations.  Labs in the ED, UDS without any positive drug screens, UA without any signs of UTI, K of 3.4 and sodium of 131, bicarb 26, creatinine 1.72.  WBC of 11.3.  Ethanol normal.  UDS negative.  UA unremarkable. IVC'd by family  Meds:  She is uncertain as to what medication she is taking  Past medical history per patient Dementia Hypertension Eliquis 5 mg twice daily Atorvastatin 80 mg daily Wellbutrin 75 mg twice daily Chlorthalidone 25 mg daily Cozaar 50 mg daily Namenda 10 mg daily Toprol all XL 50 mg daily Myrbetriq Klor-Con Zoloft  Past Surgical History:  Procedure Laterality Date   ABDOMINAL HYSTERECTOMY     Social Per patient Lives With:Relative  Occupation:Retired and previously worked as Occupational psychologist.  Level of Function: Able to perform ADLs PCP: Timothy Lasso, MD Substances: No history of alcohol or drug use. Previously smoke .5ppd x 25years   Family History: No history of dementia or hallucinations. Heart problems in grandfather.   Allergies: Allergies as of 06/12/2021   (No Known Allergies)   Review of Systems: A complete ROS was negative except as per HPI.   OBJECTIVE:   Physical Exam: Blood pressure (!) 167/78, pulse 84, temperature 98.2 F (36.8 C), temperature source Oral, resp. rate 18, SpO2 99 %.  Constitutional: No acute distress HENT: normocephalic atraumatic, mucous membranes dry Eyes: conjunctiva non-erythematous, pupils 2.5 mm Neck: supple Cardiovascular: regular rate and rhythm, no m/r/g Pulmonary/Chest: normal work of breathing on room air, lungs clear to  auscultation bilaterally Abdominal: soft, non-tender, non-distended MSK: normal bulk and tone. Negative for asterixis Skin: warm and dry Psych: Pleasantly confused Neurological:  Mental Status: Patient is awake, alert, oriented to self but not situation, place, time  No signs of aphasia or neglect Cranial Nerves: II: Pupils equal, round, and reactive to light.   III,IV, VI: EOMI, horizontal nystagmus present V: Facial sensation is symmetric to light touch VII: Facial movement is symmetric.  VIII: hearing is intact to voice X: Uvula elevates symmetrically XI: Shoulder shrug is symmetric. XII: tongue is midline without atrophy or fasciculations.  Motor: Normal effort thorughout, at Least 5/5 bilateral UE, 5/5 bilateral lower extremitiy  Sensory: Sensation is grossly intact diffusely Cerebellar: Finger-Nose and Heel-Shin  intact bilat  Labs: CBC    Component Value Date/Time   WBC 11.3 (H) 06/12/2021 1052   RBC 4.20 06/12/2021 1052   HGB 11.9 (L) 06/12/2021 1052  HCT 35.8 (L) 06/12/2021 1052   PLT 349 06/12/2021 1052   MCV 85.2 06/12/2021 1052   MCH 28.3 06/12/2021 1052   MCHC 33.2 06/12/2021 1052   RDW 13.7 06/12/2021 1052   LYMPHSABS 1.1 06/12/2021 1052   MONOABS 1.0 06/12/2021 1052   EOSABS 0.2 06/12/2021 1052   BASOSABS 0.1 06/12/2021 1052     CMP     Component Value Date/Time   NA 131 (L) 06/12/2021 1052   NA 128 (L) 05/22/2021 1113   K 3.4 (L) 06/12/2021 1052   CL 96 (L) 06/12/2021 1052   CO2 26 06/12/2021 1052   GLUCOSE 105 (H) 06/12/2021 1052   BUN 9 06/12/2021 1052   BUN 8 05/22/2021 1113   CREATININE 0.72 06/12/2021 1052   CALCIUM 9.1 06/12/2021 1052   PROT 6.2 (L) 06/12/2021 1052   ALBUMIN 3.7 06/12/2021 1052   AST 22 06/12/2021 1052   ALT 20 06/12/2021 1052   ALKPHOS 126 06/12/2021 1052   BILITOT 0.4 06/12/2021 1052   GFRNONAA >60 06/12/2021 1052   EKG: Pending  ASSESSMENT & PLAN:   Assessment & Plan by Problem: Principal Problem:   Acute  encephalopathy   Ghadeer Kastelic is a 76 y.o. 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  Family noted this difference since patient started new medication Myrbetriq.  Family endorses patient had an episode of agitation many years ago's but denied hallucinations or confusion at that time.  This had resolved at that time with psychiatric medications.  Family denies patient have any recent illnesses, rashes, bug bites, or other systemic symptoms.  On examination patient is pleasantly confused without any neurological deficits or signs symptoms of infection.  Electrolyte abnormalities are similar to 3 weeks ago.  She is not having any renal dysfunction.  Ethanol and UDS are negative.  CBC with mild leukocytosis of 11.3.  Urinalysis negative.  With her recently starting Myrbetriq I suspect that she is having a drug drug interaction.  We will hold her psychiatric medications at this time.  Low suspicion that this is secondary to new onset ischemia, ICH.  No evidence of seizure or trauma.  Do not believe that this is an infectious etiology. -Hold psychiatric medications -Delirium precautions in place -Sitter secondary to patient's confusion  -Ammonia, TSH, B12 pending  #Mild hyponatremia #Mild hypokalemia - Na 131.  K of 3.4.  Per recent chart review unclear etiology for hyponatremia but thought to be secondary to decreased oral intake and thiazide diuretic.  We will order serum osmolality and replete her potassium. -Continue oral K -Repeat BMP in the morning, mag pending  #Mild leukocytosis Neutrophil predominant leukocytosis.  Patient is without fever or other signs symptoms of infectious etiology.   We will continue to monitor for signs of infection pursue work-up at that time. -Daily CBC  #Normocytic anemia - borderline nl value. No signs of bleeding. - Monitor with morning CBC  #Overactive Bladder Discontinue Myrbetriq  #Hx PAF Normal sinus rhythm on examination.  We will continue on Eliquis.  #Hx of CVA, left frontal lobe cerebral infarction 7/22 Patient has been on Eliquis as well as a statin since.  No new neurological deficits today.  I do not believe she had a new acute infarct. -Continue Eliquis  #Anxiety/Depression Zoloft  and Wellbutrin held  Diet: Normal VTE: NOAC IVF: None,None Code: DNR The patient sister notes that the last healthcare power of attorney was the patient's ex-boyfriend who she is no longer with.  The patient's history is uncertain if this was an official document that was been notarized.  Patient's sister also notes that she was the healthcare power of attorney prior to the ex-boyfriend and this was notarized document.  The patient has 2 daughters and is not currently married.  One of the daughter states that she is a DNR and always has been.   Dispo: Admit patient to Observation with expected length of stay less than 2 midnights.  [place]  Internal Medicine Resident PGY-2 [place]  Pager: [phone]    06/12/2021, 7:22 PM

## 2021-06-13 ENCOUNTER — Encounter (HOSPITAL_COMMUNITY): Payer: Self-pay | Admitting: Internal Medicine

## 2021-06-13 ENCOUNTER — Other Ambulatory Visit: Payer: Self-pay

## 2021-06-13 DIAGNOSIS — G9340 Encephalopathy, unspecified: Secondary | ICD-10-CM | POA: Diagnosis not present

## 2021-06-13 DIAGNOSIS — F29 Unspecified psychosis not due to a substance or known physiological condition: Secondary | ICD-10-CM | POA: Diagnosis not present

## 2021-06-13 DIAGNOSIS — N3281 Overactive bladder: Secondary | ICD-10-CM

## 2021-06-13 LAB — COMPREHENSIVE METABOLIC PANEL
ALT: 19 U/L (ref 0–44)
AST: 21 U/L (ref 15–41)
Albumin: 3.5 g/dL (ref 3.5–5.0)
Alkaline Phosphatase: 111 U/L (ref 38–126)
Anion gap: 9 (ref 5–15)
BUN: 6 mg/dL — ABNORMAL LOW (ref 8–23)
CO2: 25 mmol/L (ref 22–32)
Calcium: 8.9 mg/dL (ref 8.9–10.3)
Chloride: 97 mmol/L — ABNORMAL LOW (ref 98–111)
Creatinine, Ser: 0.62 mg/dL (ref 0.44–1.00)
GFR, Estimated: >60 mL/min (ref >60)
Glucose, Bld: 90 mg/dL (ref 70–99)
Potassium: 2.9 mmol/L — ABNORMAL LOW (ref 3.5–5.1)
Sodium: 131 mmol/L — ABNORMAL LOW (ref 135–145)
Total Bilirubin: 0.5 mg/dL (ref 0.3–1.2)
Total Protein: 5.8 g/dL — ABNORMAL LOW (ref 6.5–8.1)

## 2021-06-13 LAB — CBC WITH DIFFERENTIAL/PLATELET
Abs Immature Granulocytes: 0.05 10*3/uL (ref 0.00–0.07)
Basophils Absolute: 0.1 10*3/uL (ref 0.0–0.1)
Basophils Relative: 1 %
Eosinophils Absolute: 0.2 10*3/uL (ref 0.0–0.5)
Eosinophils Relative: 2 %
HCT: 36.5 % (ref 36.0–46.0)
Hemoglobin: 12.3 g/dL (ref 12.0–15.0)
Immature Granulocytes: 1 %
Lymphocytes Relative: 16 %
Lymphs Abs: 1.3 10*3/uL (ref 0.7–4.0)
MCH: 28.5 pg (ref 26.0–34.0)
MCHC: 33.7 g/dL (ref 30.0–36.0)
MCV: 84.7 fL (ref 80.0–100.0)
Monocytes Absolute: 0.8 10*3/uL (ref 0.1–1.0)
Monocytes Relative: 10 %
Neutro Abs: 5.7 10*3/uL (ref 1.7–7.7)
Neutrophils Relative %: 70 %
Platelets: 343 10*3/uL (ref 150–400)
RBC: 4.31 MIL/uL (ref 3.87–5.11)
RDW: 13.7 % (ref 11.5–15.5)
WBC: 8.1 10*3/uL (ref 4.0–10.5)
nRBC: 0 % (ref 0.0–0.2)

## 2021-06-13 MED ORDER — MEMANTINE HCL 10 MG PO TABS
10.0000 mg | ORAL_TABLET | Freq: Two times a day (BID) | ORAL | Status: DC
  Administered 2021-06-13 – 2021-06-14 (×2): 10 mg via ORAL
  Filled 2021-06-13 (×2): qty 1

## 2021-06-13 MED ORDER — SERTRALINE HCL 50 MG PO TABS
50.0000 mg | ORAL_TABLET | Freq: Every day | ORAL | Status: DC
  Administered 2021-06-13 – 2021-06-14 (×2): 50 mg via ORAL
  Filled 2021-06-13 (×3): qty 1

## 2021-06-13 MED ORDER — MEMANTINE HCL 10 MG PO TABS
10.0000 mg | ORAL_TABLET | Freq: Every day | ORAL | Status: DC
  Administered 2021-06-13: 10 mg via ORAL
  Filled 2021-06-13 (×2): qty 1

## 2021-06-13 MED ORDER — POTASSIUM CHLORIDE CRYS ER 20 MEQ PO TBCR
40.0000 meq | EXTENDED_RELEASE_TABLET | Freq: Two times a day (BID) | ORAL | Status: AC
  Administered 2021-06-13 (×2): 40 meq via ORAL
  Filled 2021-06-13 (×2): qty 2

## 2021-06-13 MED ORDER — BUPROPION HCL 75 MG PO TABS
75.0000 mg | ORAL_TABLET | Freq: Two times a day (BID) | ORAL | Status: DC
  Administered 2021-06-13 – 2021-06-14 (×3): 75 mg via ORAL
  Filled 2021-06-13 (×5): qty 1

## 2021-06-13 MED ORDER — LOSARTAN POTASSIUM 50 MG PO TABS
50.0000 mg | ORAL_TABLET | Freq: Every day | ORAL | Status: DC
  Administered 2021-06-13 – 2021-06-14 (×2): 50 mg via ORAL
  Filled 2021-06-13 (×2): qty 1

## 2021-06-13 MED ORDER — ACETAMINOPHEN 325 MG PO TABS: 650.0000 mg | ORAL_TABLET | Freq: Once | ORAL | Status: DC

## 2021-06-13 MED ORDER — ACETAMINOPHEN 325 MG PO TABS
650.0000 mg | ORAL_TABLET | Freq: Four times a day (QID) | ORAL | Status: DC | PRN
  Administered 2021-06-13: 650 mg via ORAL
  Filled 2021-06-13: qty 2

## 2021-06-13 NOTE — TOC Initial Note (Signed)
Transition of Care [facility]) - Initial/Assessment Note    Patient Details  Name: Linette Gunderson MRN: [id number] Date of Birth: 1945-07-09  Transition of Care [facility]) CM/SW Contact:    Geralynn Ochs, LCSW Phone Number: 06/13/2021, 4:04 PM  Clinical Narrative:           Patient from home with daughter, who provides supervision and cues for patient with ADLs, but patient is otherwise independent. Daughter cooks, manages meds, and drives. Plan to return to daughter's home when medically stable. CSW to follow.        Expected Discharge Plan: Home/Self Care Barriers to Discharge: Continued Medical Work up   Patient Goals and CMS Choice Patient states their goals for this hospitalization and ongoing recovery are:: patient unable to participate in goal setting, not fully oriented CMS Medicare.gov Compare Post Acute Care list provided to:: Patient Represenative (must comment) Choice offered to / list presented to : Adult Children  Expected Discharge Plan and Services Expected Discharge Plan: Home/Self Care     Post Acute Care Choice: NA Living arrangements for the past 2 months: Single Family Home                                      Prior Living Arrangements/Services Living arrangements for the past 2 months: Single Family Home Lives with:: Adult Children Patient language and need for interpreter reviewed:: No Do you feel safe going back to the place where you live?: Yes      Need for Family Participation in Patient Care: Yes (Comment) Care giver support system in place?: Yes (comment)   Criminal Activity/Legal Involvement Pertinent to Current Situation/Hospitalization: No - Comment as needed  Activities of Daily Living Home Assistive Devices/Equipment: None ADL Screening (condition at time of admission) Patient's cognitive ability adequate to safely complete daily activities?: No Is the patient deaf or have difficulty hearing?: No Does the patient have difficulty  seeing, even when wearing glasses/contacts?: Yes Does the patient have difficulty concentrating, remembering, or making decisions?: Yes Patient able to express need for assistance with ADLs?: Yes Does the patient have difficulty dressing or bathing?: Yes Independently performs ADLs?: No Communication: Independent Dressing (OT): Needs assistance Grooming: Needs assistance, Appropriate for developmental age Feeding: Independent Bathing: Needs assistance Toileting: Needs assistance In/Out Bed: Needs assistance Walks in Home: Needs assistance Does the patient have difficulty walking or climbing stairs?: Yes Weakness of Legs: None Weakness of Arms/Hands: None  Permission Sought/Granted Permission sought to share information with : Family Supports Permission granted to share information with : Yes, Verbal Permission Granted  Share Information with NAME: [name]     Permission granted to share info w Relationship: Daughter     Emotional Assessment   Attitude/Demeanor/Rapport: Unable to Assess Affect (typically observed): Unable to Assess Orientation: : Oriented to Self Alcohol / Substance Use: Not Applicable Psych Involvement: Outpatient Provider  Admission diagnosis:  Acute encephalopathy [G93.40] Psychosis, unspecified psychosis type [facility]) [F29] Patient Active Problem List   Diagnosis Date Noted   Psychosis ([place])    Acute encephalopathy 06/12/2021   Overactive bladder 05/24/2021   Vaginal lesion 05/24/2021   Hyponatremia 05/24/2021   Hypokalemia 05/24/2021   COVID-19 10/30/2020   TIA (transient ischemic attack) 10/30/2020   Cerebral infarction ([place]) 10/29/2020   History of right breast cancer 02/25/2018   History of tobacco use 05/06/2017   Mixed hyperlipidemia 05/06/2017   Osteopenia of multiple sites 05/06/2017  Essential hypertension 10/25/2015   PAF (paroxysmal atrial fibrillation) ([place]) 10/25/2015   PCP:  Timothy Lasso, MD Pharmacy:   [facility]  [place], [address] [place] [address] Phone: [phone] Fax: [phone]     Social Determinants of Health (SDOH) Interventions    Readmission Risk Interventions No flowsheet data found.

## 2021-06-13 NOTE — Care Management Obs Status (Signed)
[place] [facility] NOTIFICATION   Patient Details  Name: Daily Doe MRN: [id number] Date of Birth: Jul 12, 1945   Medicare Observation Status Notification Given:  Yes    Geralynn Ochs, LCSW 06/13/2021, 4:03 PM

## 2021-06-13 NOTE — Progress Notes (Signed)
°  Transition of Care [facility]) Screening Note   Patient Details  Name: Shauntay Brunelli Date of Birth: 09/08/1945   Transition of Care [facility]) CM/SW Contact:    Geralynn Ochs, LCSW Phone Number: 06/13/2021, 2:45 PM    Transition of Care Department [facility]) has reviewed patient and no TOC needs have been identified at this time. We will continue to monitor patient advancement through interdisciplinary progression rounds. If new patient transition needs arise, please place a TOC consult.

## 2021-06-13 NOTE — Progress Notes (Signed)
Subjective: I seen and evaluated Marisa Nunez at bedside.  She states that she is feeling much better.  She was alert and oriented x3.  She endorsed that she noticed a change in herself since beginning new medication for overactive bladder.  She reports since being admitted, her overall breathing is improved.  Objective:  Vital signs in last 24 hours: Vitals:   06/12/21 1625 06/13/21 0229 06/13/21 0530 06/13/21 0909  BP:  (!) 164/75 (!) 158/76 (!) 183/87  Pulse: 84 79 76 84  Resp:  18 18 16   Temp:  98 F (36.7 C)  98 F (36.7 C)  TempSrc:    Oral  SpO2: 99% 100% 100% 98%   Physical Exam Constitutional:      General: She is awake. She is not in acute distress. Cardiovascular:     Rate and Rhythm: Normal rate.     Heart sounds: Normal heart sounds.  Pulmonary:     Effort: Pulmonary effort is normal.     Breath sounds: Normal air entry.  Musculoskeletal:     Right lower leg: No edema.     Left lower leg: No edema.  Skin:    General: Skin is warm and dry.  Neurological:     Mental Status: She is alert and oriented to person, place, and time.     Comments: Calm and answered questions appropriately.  Psychiatric:        Behavior: Behavior is cooperative.     Assessment/Plan:  Principal Problem:   Acute encephalopathy  Acute encephalopathy 2/2 Myrbetriq Pt was started on Myrbetriq several weeks ago with symptom onset 6 days ago.  Per family, patient experienced confusion, agitation, visual hallucinations.  There are postmarketing reports that Myrbetriq has an adverse effect in some individuals with baseline cognitive impairment causing similar symptoms this patient experienced.  At baseline, patient has dementia, which puts her at risk for such adverse event.  Since admission, Myrbetriq has been discontinued.  Patient has been improving, mentation improved, patient was A&O x3 during exam.  Sitter was present outside the room.  Patient was calm. --Discontinue Myrbetriq and  added to allergy list  #Hypokalemia #Hypomagnesia On admission patient was found to have low potassium at 2.9 and low magnesium at 1.5.  No symptoms noted.  Per chart review, patient is chronically hypokalemic and takes potassium supplements at home.  Patient also takes chlorthalidone for blood pressure control, which likely attributes to the electrolyte imbalance. --Potassium 40 mEq twice daily once --IV magnesium sulfate 2 g twice daily once --Repeat BMP tomorrow --Replete as needed --Hold chlorthalidone  #HTN #HLD Home medications include losartan 50 mg; chlorthalidone 25 mg; Toprol 50 mg daily; atorvastatin 80 mg daily Patient was hypertensive today at 158/76.  Will restart home medications except chlorthalidone in the setting of electrolyte imbalance. --Resume losartan 50 mg daily --Resume Toprol 50 mg daily --Resume atorvastatin 80 mg daily  Paroxysmal atrial fibrillation Since admission patient has been in normal sinus rhythm.  No concerning findings on EKG obtained on admission. --Continue Eliquis 5 mg twice daily  Overactive bladder Patient was started on Myrbetriq several weeks ago for overactive bladder.  Patient developed adverse reaction as stated above.  Will discontinue Myrbetriq.  We will hold off on resuming agents at this time.  Patient will likely benefit from bladder training versus medication. --Discontinue Myrbetriq  #Anxiety #Depression Centrally acting agents were initially held on admission due to to acute encephalopathy.  Source of AMS determined to be from the use  of Myrbetriq.  On exam today, patient mentation has improved.  Will resume home medications. --Continue bupropion 75 mg twice daily --Continue sertraline 50 mg daily  Dementia --Continue memantine 10 mg twice daily  Prior to Admission Living Arrangement: Anticipated Discharge Location: Barriers to Discharge: Dispo: Anticipated discharge in approximately 1-2 day(s).   Marisa Lasso,  MD 06/13/2021, 9:19 AM Pager: [phone] After 5pm on weekdays and 1pm on weekends: On Call pager [phone]

## 2021-06-14 ENCOUNTER — Telehealth: Payer: Self-pay | Admitting: Internal Medicine

## 2021-06-14 DIAGNOSIS — G9340 Encephalopathy, unspecified: Secondary | ICD-10-CM | POA: Diagnosis not present

## 2021-06-14 DIAGNOSIS — T50905A Adverse effect of unspecified drugs, medicaments and biological substances, initial encounter: Secondary | ICD-10-CM | POA: Diagnosis not present

## 2021-06-14 DIAGNOSIS — E875 Hyperkalemia: Secondary | ICD-10-CM

## 2021-06-14 DIAGNOSIS — N3281 Overactive bladder: Secondary | ICD-10-CM | POA: Diagnosis not present

## 2021-06-14 LAB — BASIC METABOLIC PANEL
Anion gap: 8 (ref 5–15)
BUN: 7 mg/dL — ABNORMAL LOW (ref 8–23)
CO2: 28 mmol/L (ref 22–32)
Calcium: 9.2 mg/dL (ref 8.9–10.3)
Chloride: 96 mmol/L — ABNORMAL LOW (ref 98–111)
Creatinine, Ser: 0.84 mg/dL (ref 0.44–1.00)
GFR, Estimated: >60 mL/min (ref >60)
Glucose, Bld: 132 mg/dL — ABNORMAL HIGH (ref 70–99)
Potassium: 4.3 mmol/L (ref 3.5–5.1)
Sodium: 132 mmol/L — ABNORMAL LOW (ref 135–145)

## 2021-06-14 LAB — MAGNESIUM: Magnesium: 1.6 mg/dL — ABNORMAL LOW (ref 1.7–2.4)

## 2021-06-14 MED ORDER — MAGNESIUM SULFATE 4 GM/100ML IV SOLN
4.0000 g | Freq: Once | INTRAVENOUS | Status: AC
  Administered 2021-06-14: 4 g via INTRAVENOUS
  Filled 2021-06-14: qty 100

## 2021-06-14 NOTE — Telephone Encounter (Signed)
TOC HFU APPT  Name: Marisa Nunez, Marisa Nunez MRN: [id number]  Date: 06/21/2021 Status: Sch  Time: 9:45 AM Length: 30  Visit Type: OPEN ESTABLISHED [726] Copay: $0.00  Provider: Dorethea Clan, DO

## 2021-06-14 NOTE — Plan of Care (Signed)
°  Problem: Consults Goal: Concurrent Medical Patient Education Description: (See Patient Education Module for education specifics) Outcome: Adequate for Discharge   Problem: [facility] Concurrent Medical Problem Goal: LTG-Pt will be physically stable and he/significant other Description: (Patient will be physically stable and he/significant other will be able to verbalize understanding of follow-up care and symptoms that would warrant further treatment) Outcome: Adequate for Discharge Goal: STG-Vital signs will be within defined limits or stabilized Description: (STG- Vital signs will be within defined limits or stabilized for individual) Outcome: Adequate for Discharge Goal: STG-Compliance with medication and/or treatment as ordered Description: (STG-Compliance with medication and/or treatment as ordered by MD) Outcome: Adequate for Discharge Goal: STG-Verbalize two symptoms that would warrant further Description: (STG-Verbalize two symptoms that would warrant further treatment) Outcome: Adequate for Discharge Goal: STG-Patient will participate in management/stabilization Description: (STG-Patient will participate in management/stabilization of medical condition) Outcome: Adequate for Discharge Goal: STG-Other (Specify): Description: STG-Other Concurrent Medical (Specify): Outcome: Adequate for Discharge   Problem: Education: Goal: Knowledge of General Education information will improve Description: Including pain rating scale, medication(s)/side effects and non-pharmacologic comfort measures Outcome: Adequate for Discharge   Problem: Health Behavior/Discharge Planning: Goal: Ability to manage health-related needs will improve Outcome: Adequate for Discharge   Problem: Clinical Measurements: Goal: Ability to maintain clinical measurements within normal limits will improve Outcome: Adequate for Discharge Goal: Will remain free from infection Outcome: Adequate for Discharge Goal:  Diagnostic test results will improve Outcome: Adequate for Discharge Goal: Respiratory complications will improve Outcome: Adequate for Discharge Goal: Cardiovascular complication will be avoided Outcome: Adequate for Discharge   Problem: Activity: Goal: Risk for activity intolerance will decrease Outcome: Adequate for Discharge   Problem: Nutrition: Goal: Adequate nutrition will be maintained Outcome: Adequate for Discharge   Problem: Coping: Goal: Level of anxiety will decrease Outcome: Adequate for Discharge   Problem: Elimination: Goal: Will not experience complications related to bowel motility Outcome: Adequate for Discharge Goal: Will not experience complications related to urinary retention Outcome: Adequate for Discharge   Problem: Pain Managment: Goal: General experience of comfort will improve Outcome: Adequate for Discharge   Problem: Safety: Goal: Ability to remain free from injury will improve Outcome: Adequate for Discharge   Problem: Skin Integrity: Goal: Risk for impaired skin integrity will decrease Outcome: Adequate for Discharge

## 2021-06-14 NOTE — TOC Transition Note (Addendum)
Transition of Care [facility]) - CM/SW Discharge Note   Patient Details  Name: Marisa Nunez MRN: [id number] Date of Birth: 13-Sep-1945  Transition of Care [facility]) CM/SW Contact:  Geralynn Ochs, LCSW Phone Number: 06/14/2021, 2:01 PM   Clinical Narrative:   CSW met with patient's daughter at bedside to ask about memory care, as daughter did not mention memory care during discussion yesterday. Daughter not interested in memory care, but for adult day care centers. CSW researched options for adult day care centers and provided to daughter at bedside. Daughter concerned about discharge as patient still seems off to her, asking to speak to MD. RN aware. CSW completed paperwork to rescind patient's IVC, sent to [employer]. No further TOC needs at this time, CSW signing off.  Patient no longer under IVC.    Final next level of care: Home/Self Care Barriers to Discharge: Barriers Resolved   Patient Goals and CMS Choice Patient states their goals for this hospitalization and ongoing recovery are:: patient unable to participate in goal setting, not fully oriented CMS Medicare.gov Compare Post Acute Care list provided to:: Patient Represenative (must comment) Choice offered to / list presented to : Adult Children  Discharge Placement                Patient to be transferred to facility by: Family Name of family member notified: [name] Patient and family notified of of transfer: 06/14/21  Discharge Plan and Services     Post Acute Care Choice: NA                               Social Determinants of Health (SDOH) Interventions     Readmission Risk Interventions No flowsheet data found.

## 2021-06-14 NOTE — Discharge Summary (Signed)
Name: Marisa Nunez MRN: [id number] DOB: 01-20-1946 76 y.o. PCP: Timothy Lasso, MD  Date of Admission: 06/12/2021 10:14 AM Date of Discharge: 06/14/21 Attending Physician: Lucious Groves, DO  Discharge Diagnosis: 1.  Acute encephalopathy 2/2 Myrbetriq 2.  Hypokalemia/hypomagnesia 3.  Overactive bladder  Discharge Medications: Allergies as of 06/14/2021       Reactions   Influenza Virus Vaccine Hives   Myrbetriq [mirabegron Er]    Confusion, agitation, hallucination    Pneumococcal Vaccine Hives        Medication List     STOP taking these medications    chlorthalidone 25 MG tablet Commonly known as: HYGROTON       TAKE these medications    acetaminophen 325 MG tablet Commonly known as: TYLENOL Take 650 mg by mouth every 6 (six) hours as needed for mild pain, moderate pain or fever.   apixaban 5 MG Tabs tablet Commonly known as: ELIQUIS Take 1 tablet (5 mg total) by mouth 2 (two) times daily.   aspirin EC 81 MG tablet Take 81 mg by mouth daily. Swallow whole.   atorvastatin 80 MG tablet Commonly known as: LIPITOR TAKE 1 TABLET(80 MG) BY MOUTH DAILY What changed: See the new instructions.   buPROPion 75 MG tablet Commonly known as: WELLBUTRIN Take 1 tablet (75 mg total) by mouth 2 (two) times daily.   losartan 50 MG tablet Commonly known as: COZAAR Take 1 tablet (50 mg total) by mouth daily.   memantine 10 MG tablet Commonly known as: NAMENDA Take 1 tablet (10 mg total) by mouth in the morning. What changed: when to take this   metoprolol succinate 50 MG 24 hr tablet Commonly known as: TOPROL-XL Take 1 tablet (50 mg total) by mouth daily. Take with or immediately following a meal.   multivitamin with minerals tablet Take 1 tablet by mouth daily.   potassium chloride 10 MEQ tablet Commonly known as: KLOR-CON Take 10 mEq by mouth daily.   sertraline 50 MG tablet Commonly known as: ZOLOFT TAKE 1 TABLET(50 MG) BY MOUTH DAILY What changed:  See the new instructions.        Disposition and follow-up:   [name] was discharged from [facility] in Stable condition.  At the hospital follow up visit please address:  1.  Follow up in [facility] clinic- address overactive bladder, encourage bladder training vs medication; assess mental status, obtain MMSE; assess potassium and magnesium levels; chlorthalidone was discontinued during hospitalization due to electrolyte imbalance, recheck BP, if normal, do not restart chlorthalidone, if elevated consider alternative diuretic such as spironolactone. Address code status and POA.  2.  Labs / imaging needed at time of follow-up: BMP- K+ level; magnesium level  3.  Pending labs/ test needing follow-up: None  Follow-up Appointments:  Follow-up [facility] Follow up in 1 week(s).   Contact information: [address] [place] Hospital Course by problem list: 1.  Patient presented to the ED with altered mental status, per family.  Patient experienced confusion, agitation and visual hallucinations onset 6 days ago.  Patient recently started on a new medication for overactive bladder, Myrbetriq, a few weeks prior to symptom onset.  Postmarketing reports regarding Myrbetriq states patient with underlying cognitive impairment, such as dementia, which can lead to symptoms including confusion, AMS, hallucinations.  Myrbetriq being the offending  agent was discontinued on admission.  Secondary work-up obtained such as TSH, vit B12, UA, UDS, ammonia levels, ethanol levels, respiratory panel to rule out secondary causes were negative.  A sitter was present throughout hospitalization to ensure safety.  Patient mentation improved since discontinuing Myrbetriq.  Patient was A&O x3.  Myrbetriq added to patient's intolerance list.  Patient was urinating appropriately throughout  hospitalization.  Discussion was had with patient regarding bladder training versus medication.  Initial labs obtained in the ED significant for hypokalemia and hypomagnesia.  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  Potassium and magnesium were repleted during hospitalization.  All other chronic conditions were managed during hospitalization.  Patient medically stable and back at baseline at time of discharge.  Discharge Exam:   BP (!) 153/63 (BP Location: Left Arm)    Pulse 66    Temp 98.8 F (37.1 C) (Oral)    Resp 18    Ht 5\' 4"  (1.626 m)    Wt 61.6 kg    LMP  (LMP Unknown)    SpO2 100%    BMI 23.31 kg/m  Discharge exam: Physical Exam Constitutional:      General: She is awake. She is not in acute distress. Cardiovascular:     Rate and Rhythm: Normal rate.     Heart sounds: Normal heart sounds.  Pulmonary:     Effort: Pulmonary effort is normal.     Breath sounds: Normal breath sounds and air entry.  Musculoskeletal:     Right lower leg: No edema.     Left lower leg: No edema.  Skin:    General: Skin is warm and dry.  Neurological:     General: No focal deficit present.     Mental Status: She is alert. Mental status is at baseline.  Psychiatric:        Behavior: Behavior is cooperative.     Comments: Patient feels "back to her normal self"      Pertinent Labs, Studies, and Procedures:  CBC Latest Ref Rng & Units 06/13/2021 06/12/2021 10/29/2020  WBC 4.0 - 10.5 K/uL 8.1 11.3(H) -  Hemoglobin 12.0 - 15.0 g/dL 12.3 11.9(L) 12.2  Hematocrit 36.0 - 46.0 % 36.5 35.8(L) 36.0  Platelets 150 - 400 K/uL 343 349 -   BMP Latest Ref Rng & Units 06/14/2021 06/13/2021 06/12/2021  Glucose 70 - 99 mg/dL 132(H) 90 105(H)  BUN 8 - 23 mg/dL 7(L) 6(L) 9  Creatinine 0.44 - 1.00 mg/dL 0.84 0.62 0.72  BUN/Creat Ratio 12 - 28 - - -  Sodium 135 - 145 mmol/L 132(L) 131(L) 131(L)  Potassium 3.5 - 5.1 mmol/L 4.3 2.9(L) 3.4(L)  Chloride 98 - 111 mmol/L 96(L) 97(L) 96(L)  CO2 22  - 32 mmol/L 28 25 26   Calcium 8.9 - 10.3 mg/dL 9.2 8.9 9.1      Discharge Instructions: Discharge Instructions     Call MD for:  difficulty breathing, headache or visual disturbances   Complete by: As directed    Call MD for:  extreme fatigue   Complete by: As directed    Call MD for:  hives   Complete by: As directed    Call MD for:  persistant dizziness or light-headedness   Complete by: As directed    Call MD for:  persistant nausea and vomiting   Complete by: As directed    Call MD for:  redness, tenderness, or signs of infection (pain, swelling, redness, odor or green/yellow discharge around incision site)  Complete by: As directed    Call MD for:  severe uncontrolled pain   Complete by: As directed    Call MD for:  temperature >100.4   Complete by: As directed    Diet - low sodium heart healthy   Complete by: As directed    Increase activity slowly   Complete by: As directed        Signed: Timothy Lasso, MD 06/14/2021, 1:06 PM   Pager: [phone]

## 2021-06-14 NOTE — Discharge Instructions (Signed)
Please stop taking Chlorthalidone; this medication may be causing your potassium levels to drop.  Please follow up in the Internal medicine clinic on your scheduled appointment.

## 2021-06-17 ENCOUNTER — Encounter: Payer: Medicare Other | Admitting: Internal Medicine

## 2021-06-21 ENCOUNTER — Encounter: Payer: Medicare Other | Admitting: Internal Medicine

## 2021-06-21 ENCOUNTER — Encounter: Payer: Self-pay | Admitting: Internal Medicine

## 2021-06-25 ENCOUNTER — Other Ambulatory Visit: Payer: Self-pay | Admitting: *Deleted

## 2021-06-25 MED ORDER — MEMANTINE HCL 10 MG PO TABS: 10.0000 mg | ORAL_TABLET | Freq: Every morning | ORAL | 0 refills | Status: DC

## 2021-06-25 MED ORDER — SERTRALINE HCL 50 MG PO TABS: ORAL_TABLET | ORAL | 0 refills | Status: DC

## 2021-06-25 MED ORDER — ATORVASTATIN CALCIUM 80 MG PO TABS
ORAL_TABLET | ORAL | 3 refills | Status: DC
Start: 2021-06-25 — End: 2022-06-30

## 2021-06-25 MED ORDER — LOSARTAN POTASSIUM 50 MG PO TABS: 50.0000 mg | ORAL_TABLET | Freq: Every day | ORAL | 3 refills | Status: DC

## 2021-06-25 MED ORDER — APIXABAN 5 MG PO TABS: 5.0000 mg | ORAL_TABLET | Freq: Two times a day (BID) | ORAL | 2 refills | Status: DC

## 2021-06-25 MED ORDER — BUPROPION HCL 75 MG PO TABS: 75.0000 mg | ORAL_TABLET | Freq: Two times a day (BID) | ORAL | 2 refills | Status: DC

## 2021-06-25 MED ORDER — METOPROLOL SUCCINATE ER 50 MG PO TB24: 50.0000 mg | ORAL_TABLET | Freq: Every day | ORAL | 2 refills | Status: DC

## 2021-06-25 MED ORDER — POTASSIUM CHLORIDE ER 10 MEQ PO TBCR: 10.0000 meq | EXTENDED_RELEASE_TABLET | Freq: Every day | ORAL | 1 refills | Status: DC

## 2021-06-25 MED ORDER — ATORVASTATIN CALCIUM 80 MG PO TABS: ORAL_TABLET | ORAL | 3 refills | Status: DC

## 2021-06-25 MED ORDER — SERTRALINE HCL 50 MG PO TABS
ORAL_TABLET | ORAL | 0 refills | Status: DC
Start: 2021-06-25 — End: 2021-09-10

## 2021-06-25 NOTE — Addendum Note (Signed)
Addended by: Ebbie Latus on: 06/25/2021 02:34 PM   Modules accepted: Orders

## 2021-06-25 NOTE — Telephone Encounter (Signed)
Received fax from [facility] stating pt has switched pharmacies and they will be the pt's new pharmacy. I called the pt to confirm, her daughter answered to the phone and stated [facility] will be the pharmacy. Stated she wants her mother to be independent as possible. Need all medications sent to the new pharmacy. Thanks

## 2021-06-25 NOTE — Telephone Encounter (Signed)
Sertraline and Atorvastatin were refilled as "Print". Please re-send rxs electronically.

## 2021-09-06 ENCOUNTER — Other Ambulatory Visit: Payer: Self-pay | Admitting: Internal Medicine

## 2021-09-10 ENCOUNTER — Other Ambulatory Visit: Payer: Self-pay | Admitting: Internal Medicine

## 2021-09-17 ENCOUNTER — Other Ambulatory Visit: Payer: Self-pay | Admitting: Internal Medicine

## 2021-09-30 ENCOUNTER — Encounter: Payer: Self-pay | Admitting: Internal Medicine

## 2021-10-01 ENCOUNTER — Ambulatory Visit (INDEPENDENT_AMBULATORY_CARE_PROVIDER_SITE_OTHER): Payer: Medicare Other | Admitting: Internal Medicine

## 2021-10-01 ENCOUNTER — Encounter: Payer: Self-pay | Admitting: Internal Medicine

## 2021-10-01 VITALS — BP 150/72 | HR 54 | Temp 98.1°F | Ht 64.0 in | Wt 127.0 lb

## 2021-10-01 DIAGNOSIS — M7551 Bursitis of right shoulder: Secondary | ICD-10-CM | POA: Diagnosis not present

## 2021-10-01 DIAGNOSIS — Z853 Personal history of malignant neoplasm of breast: Secondary | ICD-10-CM

## 2021-10-01 DIAGNOSIS — M25411 Effusion, right shoulder: Secondary | ICD-10-CM | POA: Diagnosis not present

## 2021-10-01 DIAGNOSIS — I10 Essential (primary) hypertension: Secondary | ICD-10-CM | POA: Diagnosis not present

## 2021-10-01 DIAGNOSIS — F17210 Nicotine dependence, cigarettes, uncomplicated: Secondary | ICD-10-CM | POA: Diagnosis not present

## 2021-10-01 LAB — SYNOVIAL CELL COUNT + DIFF, W/ CRYSTALS
Crystals, Fluid: NONE SEEN
Lymphocytes-Synovial Fld: 5 % (ref 0–20)
Monocyte-Macrophage-Synovial Fluid: 16 % — ABNORMAL LOW (ref 50–90)
Neutrophil, Synovial: 79 % — ABNORMAL HIGH (ref 0–25)
WBC, Synovial: 278 /mm3 — ABNORMAL HIGH (ref 0–200)

## 2021-10-01 LAB — GRAM STAIN: Gram Stain: NONE SEEN

## 2021-10-01 NOTE — Assessment & Plan Note (Signed)
The patient presents here with complaints of right shoulder swelling.  She has unable to fully abduct the right shoulder due to pain.  She states that she first noticed this about a month ago.  She is concerned that this is related to her breast cancer (status post right partial mastectomy in 2018).  Denies any drainage or surrounding erythema.   Right shoulder: There is moderate swelling at the lateral aspect of the right shoulder without surrounding erythema. Ultrasound visualization confirms presence of significant amount of fluid in the bursa.  Plan: Right arthrocentesis was performed in the clinic (see other note from today for more details).  Sent fluid for cell count and cultures.  Differential diagnosis includes CPPD versus gout versus pseudogout. Will contact pt with results.

## 2021-10-01 NOTE — Assessment & Plan Note (Signed)
The patient presents today with concern for a lump in the right breast around where her prior right partial mastectomy was.  She is unsure when her last mammogram was performed.  Breast exam was performed in the presence of a chaperone. There is bruising and induration around prior partial mastectomy site, right breast.  Plan: Differential diagnosis includes scar tissue from prior partial mastectomy.  However, given patient has not had recent mammogram, will obtain bilateral diagnostic mammogram for further evaluation.  Patient and her daughter are in agreement with this plan.

## 2021-10-01 NOTE — Progress Notes (Addendum)
   CC: lump in breast, right shoulder swelling  HPI:  [name] is a 76 y.o. with past medical history as noted below who presents to the clinic today for a lump in the breast. Please see problem-based list for further details, assessments, and plans.   Past Medical History:  Diagnosis Date   Anxiety    Cancer ([place])    COPD (chronic obstructive pulmonary disease) ([place])    Dementia ([facility])    Depression    Paroxysmal Afib    Stroke ([place])    Review of Systems: Negative aside from that listed in individualized problem based charting.   Physical Exam:  Vitals:   10/01/21 0845 10/01/21 0912  BP: (!) 150/72 (!) 150/72  Pulse: (!) 57 (!) 54  Temp: 98.1 F (36.7 C)   TempSrc: Oral   SpO2: 100%   Weight: 127 lb (57.6 kg)   Height: '5\' 4"'$  (1.626 m)    General: NAD, nl appearance HE: Normocephalic, atraumatic, EOMI, Conjunctivae normal ENT: No congestion, no rhinorrhea, no exudate or erythema  Cardiovascular: Normal rate, regular rhythm. No murmurs, rubs, or gallops Pulmonary: Effort normal, breath sounds normal. No wheezes, rales, or rhonchi Abdominal: soft, nontender, bowel sounds present Musculoskeletal: Right shoulder: There is moderate swelling at the lateral aspect of the right shoulder without surrounding erythema. Ultrasound visualization confirms presence of significant amount of fluid in the bursa. Right axillary region: Moderate TTP and swelling but no discrete nodules Right breast: There is bruising and induration around prior partial mastectomy site, performed with chaperone present Skin: Warm, dry, no bruising, erythema, or rash Psychiatric/Behavioral: normal mood, normal behavior     Assessment & Plan:   See Encounters Tab for problem based charting.  Patient discussed with Dr. Evette Doffing

## 2021-10-01 NOTE — Progress Notes (Signed)
After consent was obtained, using sterile technique the right shoulder was prepped and plain Lidocaine 1% was used as local anesthetic. The joint was entered and 44 ml's of red colored fluid was withdrawn and sent for cell count and fluid culture.  2 mL of Steroid 20 mg (triamcinolone) and 1% Lidocaine was then injected and the needle withdrawn.  The procedure was well tolerated.  The patient is asked to continue to rest the joint for a few more days before resuming regular activities.  It may be more painful for the first 1-2 days.  Watch for fever, or increased swelling or persistent pain in the joint. Call or return to clinic prn if such symptoms occur or there is failure to improve as anticipated.

## 2021-10-01 NOTE — Patient Instructions (Signed)
Thank you, [name] for allowing [place] to provide your care today. Today we discussed your shoulder and breast swelling.  I have ordered a mammogram to see if there is anything on imaging related to the swelling you are having.  We have performed a right arthrocentesis and sent the fluid to the lab. I will call you either tomorrow or Thursday with the results.    I have ordered the following labs for you:   Lab Orders         Body fluid culture         Cell count + diff,  w/ cryst-synvl fld      Referrals ordered today:   Referral Orders  No referral(s) requested today     I have ordered the following medication/changed the following medications:   Stop the following medications: There are no discontinued medications.   Start the following medications: No orders of the defined types were placed in this encounter.    Follow up:  pending fluid results     Should you have any questions or concerns please call the internal medicine clinic at [phone].

## 2021-10-01 NOTE — Assessment & Plan Note (Signed)
BP Readings from Last 3 Encounters:  10/01/21 (!) 150/72  06/14/21 139/60  05/22/21 (!) 127/55   Patient endorses compliance with her medication regimen at home. States that she took her medications right before coming into the office and that they haven't had time to kick in yet. Endorses good BP control overall otherwise.  -Continue current regimen for now

## 2021-10-02 ENCOUNTER — Telehealth: Payer: Self-pay | Admitting: *Deleted

## 2021-10-02 NOTE — Telephone Encounter (Signed)
Returned patient call. Labs reviewed. Advised to take ibuprofen PRN as this is likely expected inflammation s/p arthrocentesis. Pt amenable to the plan. Return precautions provided.

## 2021-10-02 NOTE — Progress Notes (Signed)
Internal Medicine Clinic Attending  I saw and evaluated the patient.  I personally confirmed the key portions of the history and exam documented by Dr. Lorin Glass and I reviewed pertinent patient test results.  The assessment, diagnosis, and plan were formulated together and I agree with the documentation in the resident's note.   New large subdeltoid right shoulder effusion with pain and impingement on exam. She has diffuse osteoarthritis, so I anticipate this is due to the same. We did an arthrocentesis, fluid analysis consistent with simple OA effusion. Hopefully she will have benefit form steroid injection into the joint. Not a good candidate for shoulder replacement given dementia and low functional status. Separate issue is induration of the right breast near mastectomy scar. Hard to know time course given patient's dementia. Will do mammogram to look for signs of local breast cancer recurrence.

## 2021-10-02 NOTE — Telephone Encounter (Signed)
Call from patient's daughter patient had an injection in her shoulder on yesterday. Front of arm is now swollen and tender.  Warm to touch and more painful.  Injection site is ok,  When asked if she may have slept on that side last night.  Said she may have unsure. Pain in arm pit now as well.  Wants to know what she should do.

## 2021-10-06 ENCOUNTER — Other Ambulatory Visit: Payer: Self-pay | Admitting: Internal Medicine

## 2021-10-06 LAB — CULTURE, BODY FLUID W GRAM STAIN -BOTTLE: Culture: NO GROWTH

## 2021-10-15 ENCOUNTER — Emergency Department (HOSPITAL_COMMUNITY): Payer: Medicare Other

## 2021-10-15 ENCOUNTER — Emergency Department (HOSPITAL_COMMUNITY)
Admission: EM | Admit: 2021-10-15 | Discharge: 2021-10-16 | Disposition: A | Discharge status: Home / Self Care | Payer: Medicare Other | Attending: Emergency Medicine | Admitting: Emergency Medicine

## 2021-10-15 ENCOUNTER — Encounter (HOSPITAL_COMMUNITY): Payer: Self-pay | Admitting: Emergency Medicine

## 2021-10-15 ENCOUNTER — Other Ambulatory Visit: Payer: Self-pay

## 2021-10-15 DIAGNOSIS — F03918 Unspecified dementia, unspecified severity, with other behavioral disturbance: Secondary | ICD-10-CM | POA: Insufficient documentation

## 2021-10-15 DIAGNOSIS — Z7901 Long term (current) use of anticoagulants: Secondary | ICD-10-CM | POA: Insufficient documentation

## 2021-10-15 DIAGNOSIS — R4182 Altered mental status, unspecified: Secondary | ICD-10-CM | POA: Insufficient documentation

## 2021-10-15 DIAGNOSIS — I480 Paroxysmal atrial fibrillation: Secondary | ICD-10-CM | POA: Diagnosis not present

## 2021-10-15 DIAGNOSIS — D72829 Elevated white blood cell count, unspecified: Secondary | ICD-10-CM | POA: Diagnosis not present

## 2021-10-15 DIAGNOSIS — R456 Violent behavior: Secondary | ICD-10-CM | POA: Insufficient documentation

## 2021-10-15 DIAGNOSIS — Z046 Encounter for general psychiatric examination, requested by authority: Secondary | ICD-10-CM | POA: Insufficient documentation

## 2021-10-15 DIAGNOSIS — R4689 Other symptoms and signs involving appearance and behavior: Secondary | ICD-10-CM

## 2021-10-15 DIAGNOSIS — Z8673 Personal history of transient ischemic attack (TIA), and cerebral infarction without residual deficits: Secondary | ICD-10-CM | POA: Insufficient documentation

## 2021-10-15 DIAGNOSIS — Z79899 Other long term (current) drug therapy: Secondary | ICD-10-CM | POA: Diagnosis not present

## 2021-10-15 DIAGNOSIS — Z7982 Long term (current) use of aspirin: Secondary | ICD-10-CM | POA: Diagnosis not present

## 2021-10-15 DIAGNOSIS — F0390 Unspecified dementia without behavioral disturbance: Secondary | ICD-10-CM | POA: Diagnosis present

## 2021-10-15 DIAGNOSIS — I10 Essential (primary) hypertension: Secondary | ICD-10-CM | POA: Insufficient documentation

## 2021-10-15 DIAGNOSIS — R45851 Suicidal ideations: Secondary | ICD-10-CM | POA: Diagnosis not present

## 2021-10-15 LAB — URINALYSIS, ROUTINE W REFLEX MICROSCOPIC
Bilirubin Urine: NEGATIVE
Glucose, UA: NEGATIVE mg/dL
Ketones, ur: NEGATIVE mg/dL
Leukocytes,Ua: NEGATIVE
Nitrite: NEGATIVE
Protein, ur: NEGATIVE mg/dL
Specific Gravity, Urine: 1.004 — ABNORMAL LOW (ref 1.005–1.030)
pH: 5 (ref 5.0–8.0)

## 2021-10-15 LAB — CBC
HCT: 40.8 % (ref 36.0–46.0)
Hemoglobin: 13.5 g/dL (ref 12.0–15.0)
MCH: 28.2 pg (ref 26.0–34.0)
MCHC: 33.1 g/dL (ref 30.0–36.0)
MCV: 85.4 fL (ref 80.0–100.0)
Platelets: 325 10*3/uL (ref 150–400)
RBC: 4.78 MIL/uL (ref 3.87–5.11)
RDW: 13.6 % (ref 11.5–15.5)
WBC: 15.2 10*3/uL — ABNORMAL HIGH (ref 4.0–10.5)
nRBC: 0 % (ref 0.0–0.2)

## 2021-10-15 LAB — TSH: TSH: 1.048 u[IU]/mL (ref 0.350–4.500)

## 2021-10-15 LAB — RAPID URINE DRUG SCREEN, HOSP PERFORMED
Amphetamines: NOT DETECTED
Barbiturates: NOT DETECTED
Benzodiazepines: NOT DETECTED
Cocaine: NOT DETECTED
Opiates: NOT DETECTED
Tetrahydrocannabinol: NOT DETECTED

## 2021-10-15 LAB — COMPREHENSIVE METABOLIC PANEL
ALT: 15 U/L (ref 0–44)
AST: 21 U/L (ref 15–41)
Albumin: 3.9 g/dL (ref 3.5–5.0)
Alkaline Phosphatase: 112 U/L (ref 38–126)
Anion gap: 11 (ref 5–15)
BUN: 9 mg/dL (ref 8–23)
CO2: 24 mmol/L (ref 22–32)
Calcium: 9.6 mg/dL (ref 8.9–10.3)
Chloride: 99 mmol/L (ref 98–111)
Creatinine, Ser: 0.73 mg/dL (ref 0.44–1.00)
GFR, Estimated: >60 mL/min (ref >60)
Glucose, Bld: 94 mg/dL (ref 70–99)
Potassium: 4.1 mmol/L (ref 3.5–5.1)
Sodium: 134 mmol/L — ABNORMAL LOW (ref 135–145)
Total Bilirubin: 1.1 mg/dL (ref 0.3–1.2)
Total Protein: 6.5 g/dL (ref 6.5–8.1)

## 2021-10-15 LAB — AMMONIA: Ammonia: <10 umol/L (ref 9–35)

## 2021-10-15 LAB — PROTIME-INR
INR: 1.2 (ref 0.8–1.2)
Prothrombin Time: 15.4 seconds — ABNORMAL HIGH (ref 11.4–15.2)

## 2021-10-15 LAB — ETHANOL: Alcohol, Ethyl (B): <10 mg/dL (ref <10)

## 2021-10-15 LAB — SALICYLATE LEVEL: Salicylate Lvl: <7 mg/dL — ABNORMAL LOW (ref 7.0–30.0)

## 2021-10-15 LAB — ACETAMINOPHEN LEVEL: Acetaminophen (Tylenol), Serum: <10 ug/mL — ABNORMAL LOW (ref 10–30)

## 2021-10-15 MED ORDER — LOSARTAN POTASSIUM 50 MG PO TABS
50.0000 mg | ORAL_TABLET | Freq: Every day | ORAL | Status: DC
  Administered 2021-10-15 – 2021-10-16 (×2): 50 mg via ORAL
  Filled 2021-10-15 (×2): qty 1

## 2021-10-15 MED ORDER — APIXABAN 5 MG PO TABS
5.0000 mg | ORAL_TABLET | Freq: Two times a day (BID) | ORAL | Status: DC
  Administered 2021-10-15 – 2021-10-16 (×2): 5 mg via ORAL
  Filled 2021-10-15 (×2): qty 1

## 2021-10-15 MED ORDER — METOPROLOL SUCCINATE ER 25 MG PO TB24
50.0000 mg | ORAL_TABLET | Freq: Every day | ORAL | Status: DC
  Administered 2021-10-15 – 2021-10-16 (×2): 50 mg via ORAL
  Filled 2021-10-15 (×2): qty 2

## 2021-10-15 MED ORDER — SERTRALINE HCL 50 MG PO TABS
50.0000 mg | ORAL_TABLET | Freq: Every day | ORAL | Status: DC
  Administered 2021-10-16: 50 mg via ORAL
  Filled 2021-10-15: qty 1
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
## 2021-10-15 NOTE — ED Notes (Signed)
Daughter staying with patient in triage.

## 2021-10-15 NOTE — ED Notes (Signed)
Pt belongings placed in locker number 1 

## 2021-10-15 NOTE — ED Provider Triage Note (Signed)
Emergency Medicine Provider Triage Evaluation Note  Marisa Nunez , Nunez 76 y.o. female  was evaluated in triage.  Pt complains of psychiatric evaluation onset today.  Patient has Nunez history of dementia and is here with her daughter.  Per daughter patient stated to her that she wanted to kill herself.  When I asked patient, patient notes that she denies SI/HI, auditory/visual hallucinations at this time.  Denies pain or urinary symptoms at this time..  Review of Systems  Positive: As per HPI above Negative:   Physical Exam  BP (!) 211/86 (BP Location: Right Arm)   Pulse 67   Temp 98.4 F (36.9 C) (Oral)   Resp 18   Ht '5\' 4"'$  (1.626 m)   Wt 56.7 kg   LMP  (LMP Unknown)   SpO2 98%   BMI 21.46 kg/m  Gen:   Awake, no distress   Resp:  Normal effort  MSK:   Moves extremities without difficulty Other:    Medical Decision Making  Medically screening exam initiated at 6:27 PM.  Appropriate orders placed.  Marisa Nunez was informed that the remainder of the evaluation will be completed by another provider, this initial triage assessment does not replace that evaluation, and the importance of remaining in the ED until their evaluation is complete.  Work-up initiated   Marisa Klas A, PA-C 10/15/21 1828
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
## 2021-10-16 ENCOUNTER — Encounter (HOSPITAL_COMMUNITY): Payer: Self-pay | Admitting: Registered Nurse

## 2021-10-16 DIAGNOSIS — S43006A Unspecified dislocation of unspecified shoulder joint, initial encounter: Secondary | ICD-10-CM | POA: Diagnosis not present

## 2021-10-16 DIAGNOSIS — F03918 Unspecified dementia, unspecified severity, with other behavioral disturbance: Secondary | ICD-10-CM

## 2021-10-16 DIAGNOSIS — M24411 Recurrent dislocation, right shoulder: Secondary | ICD-10-CM | POA: Diagnosis not present

## 2021-10-16 DIAGNOSIS — F0390 Unspecified dementia without behavioral disturbance: Secondary | ICD-10-CM | POA: Diagnosis present

## 2021-10-16 LAB — URINALYSIS, COMPLETE (UACMP) WITH MICROSCOPIC
Bilirubin Urine: NEGATIVE
Glucose, UA: NEGATIVE mg/dL
Hgb urine dipstick: NEGATIVE
Ketones, ur: NEGATIVE mg/dL
Leukocytes,Ua: NEGATIVE
Nitrite: NEGATIVE
Protein, ur: NEGATIVE mg/dL
Specific Gravity, Urine: 1.002 — ABNORMAL LOW (ref 1.005–1.030)
pH: 6 (ref 5.0–8.0)

## 2021-10-16 MED ORDER — LORAZEPAM 2 MG/ML IJ SOLN
INTRAMUSCULAR | Status: AC
  Administered 2021-10-16: 2 mg via INTRAMUSCULAR
  Filled 2021-10-16: qty 1

## 2021-10-16 MED ORDER — HALOPERIDOL LACTATE 5 MG/ML IJ SOLN: 5.0000 mg | Freq: Once | INTRAMUSCULAR | Status: DC

## 2021-10-16 MED ORDER — LORAZEPAM 2 MG/ML IJ SOLN: 2.0000 mg | Freq: Once | INTRAMUSCULAR | Status: AC

## 2021-10-16 MED ORDER — OLANZAPINE 5 MG PO TBDP
5.0000 mg | ORAL_TABLET | Freq: Once | ORAL | Status: AC
  Administered 2021-10-16: 5 mg via ORAL
  Filled 2021-10-16: qty 1

## 2021-10-16 NOTE — ED Notes (Signed)
Patient at nurse's station requesting to call "mom" and "dad" to pick her up.

## 2021-10-16 NOTE — Consult Note (Cosign Needed)
Telepsych Consultation   Reason for Consult:  behavioral disturbances Referring Physician:  Godfrey Pick, MD Location of Patient: [facility] Location of Provider: Other: [facility]  Patient Identification: Marisa Nunez MRN:  [id number] Principal Diagnosis: Dementia with behavioral disturbance ([place]) Diagnosis:  Principal Problem:   Dementia with behavioral disturbance ([place])   Total Time spent with patient: 30 minutes  Subjective:   Marisa Nunez is a 76 y.o. female patient with medical history includes HTN, HLD, osteopenia, paroxysmal atrial fibrillation, CVA, TIA, dementia, and psychiatric history of depression and anxiety was admitted to [facility] ED after presenting with her daughter and complaints of behavioral disturbances and making suicidal statement when discussions of SNF or assisted living placement is mentioned by family.     HPI:  Marisa Nunez, 76 y.o., female patient seen via tele health by this provider, consulted with Dr. Hampton Abbot; and chart reviewed on 10/16/21.  On evaluation Marisa Nunez reports she is not sure why she was brought to the hospital "to get different test and go over the test I guess."  Patient states that she lives with her daughter.  "I live with my daughter and her 2 girls."  Patient states that everything at home is just fine and that she likes living with her daughter."  Patient denies that she has acted out with violent behaviors at home.  When asked about suicidal ideation she states "Heck no, I would never do anything like that I have to many things to live for my children, grandchildren, and great grand children."  Patient also denies homicidal ideation "Lord No, that would send me straight to hell."  Patient also denies auditory and visual hallucinations.  She states she is eating and sleeping without difficulty.  She then starts talking about what a great cook her daughter is on the grill and how much she likes to eat.   During evaluation Marisa Nunez is sitting  on side of bed with no noted distress.  She is alert and oriented to person and place.  She is unaware of situation for being in hospital but she was able to tell which hospital she was in and what city and state.  She was able to give correct answers for country, DOB, and Age.  When asked about the current president she was unable to give the name "I can see his face but can't get the name.  When asked about the president prior to current president she states "The man that had all the money.  I can't think of his name. I'm having a brain fart right now."  Patient was pleasant and cooperative throughout assessment.  Her mood was euthymic with congruent affect.  She is speaking in a clear tone at moderate volume, and normal pace; with good eye contact.  Her thought process is coherent and relevant to questioning.  There is no indication that she is currently responding to internal/external stimuli or experiencing delusional thought content.  She denies suicidal/self-harm/homicidal ideation, psychosis, and paranoia.     Collateral Information:  Spoke with patients daughter Marisa Nunez at [phone] and informed that her mother has been psychiatric cleared and doesn't meet criteria for inpatient psychiatric treatment.  He mother can follow up with neurology and if she needs information on SNF or assisted living placement social work could speak with her about that.  Understanding voiced.     Past Psychiatric History: Anxiety and depression  Risk to Self:  Denies Risk to Others:  Denies Prior Inpatient Therapy:  Denies Prior Outpatient Therapy:  Yes  Past Medical History:  Past Medical History:  Diagnosis Date   Anxiety    Cancer ([place])    COPD (chronic obstructive pulmonary disease) ([place])    Dementia (HCC)    Depression    Paroxysmal Afib    Stroke [facility])     Past Surgical History:  Procedure Laterality Date   ABDOMINAL HYSTERECTOMY     Family History: History reviewed. No pertinent  family history. Family Psychiatric  History: None reported Social History:  Social History   Substance and Sexual Activity  Alcohol Use Yes   Alcohol/week: 6.0 standard drinks of alcohol   Types: 6 Cans of beer per week     Social History   Substance and Sexual Activity  Drug Use Never    Social History   Socioeconomic History   Marital status: Unknown    Spouse name: Not on file   Number of children: Not on file   Years of education: Not on file   Highest education level: Not on file  Occupational History   Not on file  Tobacco Use   Smoking status: Every Day    Packs/day: 0.50    Types: Cigarettes   Smokeless tobacco: Not on file  Vaping Use   Vaping Use: Never used  Substance and Sexual Activity   Alcohol use: Yes    Alcohol/week: 6.0 standard drinks of alcohol    Types: 6 Cans of beer per week   Drug use: Never   Sexual activity: Yes  Other Topics Concern   Not on file  Social History Narrative   Not on file   Social Determinants of Health   Financial Resource Strain: Not on file  Food Insecurity: Not on file  Transportation Needs: Not on file  Physical Activity: Not on file  Stress: Not on file  Social Connections: Not on file   Additional Social History:    Allergies:   Allergies  Allergen Reactions   Influenza Virus Vaccine Hives   Myrbetriq [Mirabegron Er]     Confusion, agitation, hallucination    Pneumococcal Vaccine Hives    Labs:  Results for orders placed or performed during the hospital encounter of 10/15/21 (from the past 48 hour(s))  Comprehensive metabolic panel     Status: Abnormal   Collection Time: 10/15/21  5:47 PM  Result Value Ref Range   Sodium 134 (L) 135 - 145 mmol/L   Potassium 4.1 3.5 - 5.1 mmol/L   Chloride 99 98 - 111 mmol/L   CO2 24 22 - 32 mmol/L   Glucose, Bld 94 70 - 99 mg/dL    Comment: Glucose reference range applies only to samples taken after fasting for at least 8 hours.   BUN 9 8 - 23 mg/dL    Creatinine, Ser 0.73 0.44 - 1.00 mg/dL   Calcium 9.6 8.9 - 10.3 mg/dL   Total Protein 6.5 6.5 - 8.1 g/dL   Albumin 3.9 3.5 - 5.0 g/dL   AST 21 15 - 41 U/L   ALT 15 0 - 44 U/L   Alkaline Phosphatase 112 38 - 126 U/L   Total Bilirubin 1.1 0.3 - 1.2 mg/dL   GFR, Estimated >60 >60 mL/min    Comment: (NOTE) Calculated using the CKD-EPI Creatinine Equation (2021)    Anion gap 11 5 - 15    Comment: Performed at [facility] [address], [place], [place] [address]  Ethanol     Status: None  Collection Time: 10/15/21  5:47 PM  Result Value Ref Range   Alcohol, Ethyl (B) <10 <10 mg/dL    Comment: (NOTE) Lowest detectable limit for serum alcohol is 10 mg/dL.  For medical purposes only. Performed at [facility], [place] [address] [place], [place] [address]   Salicylate level     Status: Abnormal   Collection Time: 10/15/21  5:47 PM  Result Value Ref Range   Salicylate Lvl <3.3 (L) 7.0 - 30.0 mg/dL    Comment: Performed at [facility] [address], [place], [place] [address]  Acetaminophen level     Status: Abnormal   Collection Time: 10/15/21  5:47 PM  Result Value Ref Range   Acetaminophen (Tylenol), Serum <10 (L) 10 - 30 ug/mL    Comment: (NOTE) Therapeutic concentrations vary significantly. A range of 10-30 ug/mL  may be an effective concentration for many patients. However, some  are best treated at concentrations outside of this range. Acetaminophen concentrations >150 ug/mL at 4 hours after ingestion  and >50 ug/mL at 12 hours after ingestion are often associated with  toxic reactions.  Performed at [facility], [place] [address], [place], [place] [address]   cbc     Status: Abnormal   Collection Time: 10/15/21  5:47 PM  Result Value Ref Range   WBC 15.2 (H) 4.0 - 10.5 K/uL   RBC 4.78 3.87 - 5.11 MIL/uL   Hemoglobin 13.5 12.0 - 15.0 g/dL   HCT 40.8 36.0 - 46.0 %   MCV 85.4 80.0 - 100.0 fL   MCH 28.2 26.0 - 34.0 pg   MCHC 33.1 30.0 - 36.0  g/dL   RDW 13.6 11.5 - 15.5 %   Platelets 325 150 - 400 K/uL   nRBC 0.0 0.0 - 0.2 %    Comment: Performed at [facility], [place] [address], [place], [place] [address]  Protime-INR     Status: Abnormal   Collection Time: 10/15/21  6:03 PM  Result Value Ref Range   Prothrombin Time 15.4 (H) 11.4 - 15.2 seconds   INR 1.2 0.8 - 1.2    Comment: (NOTE) INR goal varies based on device and disease states. Performed at [facility], [place] [address] [place], [place] [address]   Ammonia     Status: None   Collection Time: 10/15/21  6:03 PM  Result Value Ref Range   Ammonia <10 9 - 35 umol/L    Comment: Performed at [facility], [place] [address], [place], [place] [address]  TSH     Status: None   Collection Time: 10/15/21  6:03 PM  Result Value Ref Range   TSH 1.048 0.350 - 4.500 uIU/mL    Comment: Performed by a 3rd Generation assay with a functional sensitivity of <=0.01 uIU/mL. Performed at [facility], [place] [address], [place], [place] [address]   Rapid urine drug screen (hospital performed)     Status: None   Collection Time: 10/15/21  9:40 PM  Result Value Ref Range   Opiates NONE DETECTED NONE DETECTED   Cocaine NONE DETECTED NONE DETECTED   Benzodiazepines NONE DETECTED NONE DETECTED   Amphetamines NONE DETECTED NONE DETECTED   Tetrahydrocannabinol NONE DETECTED NONE DETECTED   Barbiturates NONE DETECTED NONE DETECTED    Comment: (NOTE) DRUG SCREEN FOR MEDICAL PURPOSES ONLY.  IF CONFIRMATION IS NEEDED FOR ANY PURPOSE, NOTIFY LAB WITHIN 5 DAYS.  LOWEST DETECTABLE LIMITS FOR URINE DRUG  SCREEN Drug Class                     Cutoff (ng/mL) Amphetamine and metabolites    1000 Barbiturate and metabolites    200 Benzodiazepine                 027 Tricyclics and metabolites     300 Opiates and metabolites        300 Cocaine and metabolites        300 THC                            50 Performed at [facility], [place] [address] [place] [address] [place],  [place] [address]   Urinalysis, Routine w reflex microscopic Urine, Clean Catch     Status: Abnormal   Collection Time: 10/15/21  9:40 PM  Result Value Ref Range   Color, Urine YELLOW YELLOW   APPearance CLEAR CLEAR   Specific Gravity, Urine 1.004 (L) 1.005 - 1.030   pH 5.0 5.0 - 8.0   Glucose, UA NEGATIVE NEGATIVE mg/dL   Hgb urine dipstick SMALL (A) NEGATIVE   Bilirubin Urine NEGATIVE NEGATIVE   Ketones, ur NEGATIVE NEGATIVE mg/dL   Protein, ur NEGATIVE NEGATIVE mg/dL   Nitrite NEGATIVE NEGATIVE   Leukocytes,Ua NEGATIVE NEGATIVE   RBC / HPF 0-5 0 - 5 RBC/hpf   WBC, UA 0-5 0 - 5 WBC/hpf   Bacteria, UA RARE (A) NONE SEEN   Squamous Epithelial / LPF 0-5 0 - 5    Comment: Performed at [facility], [place] [address] [place], [place] [address]    Medications:  Current Facility-Administered Medications  Medication Dose Route Frequency Provider Last Rate Last Admin   apixaban (ELIQUIS) tablet 5 mg  5 mg Oral BID Godfrey Pick, MD   5 mg at 10/16/21 7425   haloperidol lactate (HALDOL) injection 5 mg  5 mg Intramuscular Once Pollina, Gwenyth Allegra, MD       losartan (COZAAR) tablet 50 mg  50 mg Oral Daily Godfrey Pick, MD   50 mg at 10/16/21 0906   metoprolol succinate (TOPROL-XL) 24 hr tablet 50 mg  50 mg Oral Daily Godfrey Pick, MD   50 mg at 10/16/21 0906   sertraline (ZOLOFT) tablet 50 mg  50 mg Oral Daily Godfrey Pick, MD   50 mg at 10/16/21 9563   Current Outpatient Medications  Medication Sig Dispense Refill   acetaminophen (TYLENOL) 325 MG tablet Take 650 mg by mouth every 6 (six) hours as needed for mild pain, moderate pain or fever.     apixaban (ELIQUIS) 5 MG TABS tablet Take 1 tablet (5 mg total) by mouth 2 (two) times daily. 120 tablet 2   aspirin EC 81 MG tablet Take 81 mg by mouth daily. Swallow whole.     atorvastatin (LIPITOR) 80 MG tablet TAKE 1 TABLET(80 MG) BY MOUTH DAILY 90 tablet 3   buPROPion (WELLBUTRIN) 75 MG tablet Take 1 tablet by mouth twice daily. 60 tablet 0    losartan (COZAAR) 50 MG tablet Take 1 tablet (50 mg total) by mouth daily. 90 tablet 3   memantine (NAMENDA) 10 MG tablet Take 1 tablet by mouth in the morning. (Patient taking differently: Take 10 mg by mouth 2 (two) times daily.) 90 tablet 0   metoprolol succinate (TOPROL-XL) 50 MG 24 hr tablet Take 1 tablet (50 mg total) by mouth daily. Take with or immediately  following a meal. 90 tablet 2   Multiple Vitamins-Minerals (MULTIVITAMIN WITH MINERALS) tablet Take 1 tablet by mouth daily.     potassium chloride (KLOR-CON) 10 MEQ tablet Take 1 tablet by mouth daily. 30 tablet 0   sertraline (ZOLOFT) 50 MG tablet TAKE 1 TABLET(50 MG) BY MOUTH DAILY 90 tablet 0    Musculoskeletal: Strength & Muscle Tone: within normal limits Gait & Station: normal Patient leans: N/A     Psychiatric Specialty Exam:  Presentation  General Appearance: Appropriate for Environment  Eye Contact:Good  Speech:Clear and Coherent; Normal Rate  Speech Volume:Normal  Handedness:Right   Mood and Affect  Mood:Euthymic  Affect:Appropriate; Congruent   Thought Process  Thought Processes:Coherent; Goal Directed  Descriptions of Associations:Intact  Orientation:Full (Time, Place and Person)  Thought Content:Logical  History of Schizophrenia/Schizoaffective disorder:No  Duration of Psychotic Symptoms:No data recorded Hallucinations:Hallucinations: None  Ideas of Reference:None  Suicidal Thoughts:Suicidal Thoughts: No  Homicidal Thoughts:Homicidal Thoughts: No   Sensorium  Memory:Recent Poor; Immediate Poor  Judgment:Fair  Insight:Present   Executive Functions  Concentration:Fair  Attention Span:Good  [place] of Knowledge:Good  Language:Good   Psychomotor Activity  Psychomotor Activity:Psychomotor Activity: Normal   Assets  Assets:Communication Skills; Desire for Improvement; Financial Resources/Insurance; Housing; Social Support   Sleep  Sleep:Sleep:  Good    Physical Exam: Physical Exam Review of Systems  Constitutional: Negative.   HENT: Negative.    Eyes: Negative.   Respiratory: Negative.    Cardiovascular: Negative.   Gastrointestinal: Negative.   Genitourinary:  Positive for frequency. Negative for dysuria.  Musculoskeletal: Negative.   Skin: Negative.   Neurological: Negative.   Endo/Heme/Allergies: Negative.   Psychiatric/Behavioral:  Depression: Denies. Hallucinations: Denies. Substance abuse: Denies. Suicidal ideas: Denies. Nervous/anxious: Denies. Insomnia: Denies.    Blood pressure (!) 153/69, pulse 90, temperature 97.6 F (36.4 C), temperature source Oral, resp. rate 18, height '5\' 4"'$  (1.626 m), weight 56.7 kg, SpO2 100 %. Body mass index is 21.46 kg/m.  Treatment Plan Summary: Plan Psychiatric clear.  Patient to follow up with neurology.  [place] coordinator can give outpatient psychiatric resources.    Disposition: No evidence of imminent risk to self or others at present.   Patient does not meet criteria for psychiatric inpatient admission. Supportive therapy provided about ongoing stressors. Discussed crisis plan, support from social network, calling 911, coming to the Emergency Department, and calling Suicide Hotline.  This service was provided via telemedicine using a 2-way, interactive audio and video technology.  Names of all persons participating in this telemedicine service and their role in this encounter. Name: Earleen Newport Role: NP  Name: Marisa Nunez Role: Patient  Name:  Role:   Name:  Role:    Secure message sent to patients nurse, social work/TOC, [facility] coordinator informing:  Psychiatric consult completed and patient has been psychiatrically cleared.  Patient can follow up with neurology and outpatient psychiatric resources can be given.  Patients' daughter advised to speak to social work/TOC if she needed information on the process of placing her mother into SNF or an assisted living facility.      Marisa Stauffer, NP 10/16/2021 1:01 PM

## 2021-10-16 NOTE — ED Notes (Signed)
Patient discharged to care of her daughter.  All belongings returned.  Follow up care discussed.  Daughter [name]) states understanding.

## 2021-10-16 NOTE — ED Notes (Signed)
Patient on phone speaking with her daughter [name].

## 2021-10-16 NOTE — Discharge Instructions (Signed)
Follow-up as instructed by behavioral health 

## 2021-10-16 NOTE — ED Notes (Signed)
TTS in process 

## 2021-10-16 NOTE — Progress Notes (Signed)
Pt has been psych cleared per Earleen Newport, NP. CSW added community resources for outpatient therapy to pt's AVS. TOC to follow up and assist with questions about SNF/ ALF placement. This CSW will now remove pt from the [facility] shift report and TOC to follow with discharge needs.  Benjaman Kindler, MSW, LCSWA 10/16/2021 2:05 PM

## 2021-10-16 NOTE — ED Notes (Addendum)
Patient attempted to elope from unit.  Security and Probation officer assisted patient back into her room at which time she attempted to Media planner.  MD notified.
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
## 2021-10-16 NOTE — ED Notes (Signed)
Patient is currently being IVC'd for her safety at this time; Pt roaming the halls and can not be redirected back to her room; EDP has been made aware of the increase agitation; Patient has been advised of impending IVC and now back in her room; pt is verbally aggressive with staff and threw the sandwich bag out the door; Sitter remains with patient at this time; Pt can not accurately state her age or the year we are in; pt states we are in 2095 and she is 76 years old- Monique,RN

## 2021-10-16 NOTE — Progress Notes (Signed)
Transition of Care [facility]) - Emergency Department Mini Assessment   Patient Details  Name: Marisa Nunez MRN: [id number] Date of Birth: 1945-05-17  Transition of [facility]) CM/SW Contact:    Gaetano Hawthorne Tarpley-Carter, LCSWA Phone Number: 10/16/2021, 6:02 PM   Clinical Narrative: [facility] CSW consulted with pts daughter, Wilhemena Durie [phone].  [name] stated that pt has lived with her for 1 1/2 years.  Pt has her own room, but she needs help with her.  CSW explained Medicaid rules and guidelines.  Pt currently receives over the allowed amount for Medicaid, therefore she does not qualify.  Pt will return home and [name] will seek the assistance of PCP to assist with placing pt at a ALF, LTC, or Memory Care facility.  [name] is currently on her way to pick up pt.  Rubi Tooley Tarpley-Carter, MSW, LCSW-A Pronouns:  She/Her/Hers [facility] of Care Clinical Social Worker Direct Number:  [phone] [email or web address]    ED Mini Assessment: What brought you to the Emergency Department? : Psych Evaluation  Barriers to Discharge: No Barriers Identified        Interventions which prevented an admission or readmission: SNF Placement    Patient Contact and Communications Key Contact 1: [place] with: Ardelle Balls Date: 10/16/21,     Contact Phone Number: [phone] Call outcome: Pts daughter, [name] will be coming to pick her up.  Patient states their goals for this hospitalization and ongoing recovery are:: Wants to return home. CMS Medicare.gov Compare Post Acute Care list provided to:: Other (Comment Required) (Pts daughter, [name].) Choice offered to / list presented to : NA  Admission diagnosis:  Z04.6; dementia pt Patient Active Problem List   Diagnosis Date Noted   Dementia with behavioral disturbance ([place]) 10/16/2021   Bursitis of right shoulder 10/01/2021   Drug reaction     Psychosis ([place])    Acute encephalopathy 06/12/2021   Overactive bladder 05/24/2021   Vaginal lesion 05/24/2021   Hyponatremia 05/24/2021   Hypokalemia 05/24/2021   COVID-19 10/30/2020   TIA (transient ischemic attack) 10/30/2020   Cerebral infarction ([place]) 10/29/2020   History of right breast cancer 02/25/2018   History of tobacco use 05/06/2017   Mixed hyperlipidemia 05/06/2017   Osteopenia of multiple sites 05/06/2017   Essential hypertension 10/25/2015   PAF (paroxysmal atrial fibrillation) ([place]) 10/25/2015   PCP:  Timothy Lasso, MD Pharmacy:   [facility] [place], [address] [place] [address] Phone: [phone] Fax: [phone]  [facility] [place] [address] Phone: [phone] Fax: [phone]

## 2021-10-16 NOTE — ED Notes (Signed)
Patient cursing at staff attempting to grab items off the nurse's station.  Security present.  Patient redirected back to room.  Patient tearful.

## 2021-10-16 NOTE — ED Notes (Signed)
TTS in progress 
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
## 2021-10-16 NOTE — Progress Notes (Signed)
TOC CSW notified Irine Seal, RN that pts daughter, Wilhemena Durie [phone] was on her way to pick up pt.  Crawford Tamura Tarpley-Carter, MSW, LCSW-A Pronouns:  She/Her/Hers [facility] of Care Clinical Social Worker Direct Number:  [phone] [email or web address]

## 2021-10-17 ENCOUNTER — Emergency Department (HOSPITAL_COMMUNITY)
Admission: EM | Admit: 2021-10-17 | Discharge: 2021-10-17 | Disposition: A | Discharge status: Home / Self Care | Payer: Medicare Other | Source: Home / Self Care | Attending: Emergency Medicine | Admitting: Emergency Medicine

## 2021-10-17 ENCOUNTER — Emergency Department (HOSPITAL_COMMUNITY): Payer: Medicare Other

## 2021-10-17 ENCOUNTER — Encounter (HOSPITAL_COMMUNITY): Payer: Self-pay

## 2021-10-17 ENCOUNTER — Encounter (HOSPITAL_COMMUNITY): Payer: Self-pay | Admitting: Emergency Medicine

## 2021-10-17 ENCOUNTER — Other Ambulatory Visit: Payer: Self-pay | Admitting: Internal Medicine

## 2021-10-17 DIAGNOSIS — R443 Hallucinations, unspecified: Secondary | ICD-10-CM | POA: Insufficient documentation

## 2021-10-17 DIAGNOSIS — S92411A Displaced fracture of proximal phalanx of right great toe, initial encounter for closed fracture: Secondary | ICD-10-CM | POA: Insufficient documentation

## 2021-10-17 DIAGNOSIS — W06XXXA Fall from bed, initial encounter: Secondary | ICD-10-CM | POA: Insufficient documentation

## 2021-10-17 DIAGNOSIS — Z7901 Long term (current) use of anticoagulants: Secondary | ICD-10-CM | POA: Insufficient documentation

## 2021-10-17 DIAGNOSIS — J449 Chronic obstructive pulmonary disease, unspecified: Secondary | ICD-10-CM | POA: Insufficient documentation

## 2021-10-17 DIAGNOSIS — F03918 Unspecified dementia, unspecified severity, with other behavioral disturbance: Secondary | ICD-10-CM | POA: Insufficient documentation

## 2021-10-17 DIAGNOSIS — S43004A Unspecified dislocation of right shoulder joint, initial encounter: Secondary | ICD-10-CM | POA: Insufficient documentation

## 2021-10-17 DIAGNOSIS — X58XXXA Exposure to other specified factors, initial encounter: Secondary | ICD-10-CM | POA: Insufficient documentation

## 2021-10-17 DIAGNOSIS — Z859 Personal history of malignant neoplasm, unspecified: Secondary | ICD-10-CM | POA: Insufficient documentation

## 2021-10-17 DIAGNOSIS — F17210 Nicotine dependence, cigarettes, uncomplicated: Secondary | ICD-10-CM | POA: Insufficient documentation

## 2021-10-17 DIAGNOSIS — Z7982 Long term (current) use of aspirin: Secondary | ICD-10-CM | POA: Insufficient documentation

## 2021-10-17 DIAGNOSIS — Z79899 Other long term (current) drug therapy: Secondary | ICD-10-CM | POA: Insufficient documentation

## 2021-10-17 LAB — CBC WITH DIFFERENTIAL/PLATELET
Abs Immature Granulocytes: 0.04 10*3/uL (ref 0.00–0.07)
Basophils Absolute: 0.1 10*3/uL (ref 0.0–0.1)
Basophils Relative: 0 %
Eosinophils Absolute: 0.1 10*3/uL (ref 0.0–0.5)
Eosinophils Relative: 1 %
HCT: 39.7 % (ref 36.0–46.0)
Hemoglobin: 13.1 g/dL (ref 12.0–15.0)
Immature Granulocytes: 0 %
Lymphocytes Relative: 9 %
Lymphs Abs: 1 10*3/uL (ref 0.7–4.0)
MCH: 28.4 pg (ref 26.0–34.0)
MCHC: 33 g/dL (ref 30.0–36.0)
MCV: 85.9 fL (ref 80.0–100.0)
Monocytes Absolute: 1 10*3/uL (ref 0.1–1.0)
Monocytes Relative: 9 %
Neutro Abs: 9.4 10*3/uL — ABNORMAL HIGH (ref 1.7–7.7)
Neutrophils Relative %: 81 %
Platelets: 308 10*3/uL (ref 150–400)
RBC: 4.62 MIL/uL (ref 3.87–5.11)
RDW: 13.9 % (ref 11.5–15.5)
WBC: 11.6 10*3/uL — ABNORMAL HIGH (ref 4.0–10.5)
nRBC: 0 % (ref 0.0–0.2)

## 2021-10-17 LAB — COMPREHENSIVE METABOLIC PANEL
ALT: 13 U/L (ref 0–44)
AST: 18 U/L (ref 15–41)
Albumin: 3.7 g/dL (ref 3.5–5.0)
Alkaline Phosphatase: 110 U/L (ref 38–126)
Anion gap: 10 (ref 5–15)
BUN: 11 mg/dL (ref 8–23)
CO2: 23 mmol/L (ref 22–32)
Calcium: 9.2 mg/dL (ref 8.9–10.3)
Chloride: 100 mmol/L (ref 98–111)
Creatinine, Ser: 0.89 mg/dL (ref 0.44–1.00)
GFR, Estimated: >60 mL/min (ref >60)
Glucose, Bld: 127 mg/dL — ABNORMAL HIGH (ref 70–99)
Potassium: 3.3 mmol/L — ABNORMAL LOW (ref 3.5–5.1)
Sodium: 133 mmol/L — ABNORMAL LOW (ref 135–145)
Total Bilirubin: 0.7 mg/dL (ref 0.3–1.2)
Total Protein: 6.1 g/dL — ABNORMAL LOW (ref 6.5–8.1)

## 2021-10-17 LAB — ETHANOL: Alcohol, Ethyl (B): <10 mg/dL (ref <10)

## 2021-10-17 LAB — ACETAMINOPHEN LEVEL: Acetaminophen (Tylenol), Serum: <10 ug/mL — ABNORMAL LOW (ref 10–30)

## 2021-10-17 LAB — SALICYLATE LEVEL: Salicylate Lvl: <7 mg/dL — ABNORMAL LOW (ref 7.0–30.0)

## 2021-10-17 MED ORDER — LABETALOL HCL 5 MG/ML IV SOLN
10.0000 mg | Freq: Once | INTRAVENOUS | Status: AC
  Administered 2021-10-17: 10 mg via INTRAVENOUS
  Filled 2021-10-17: qty 4

## 2021-10-17 MED ORDER — PROPOFOL 10 MG/ML IV BOLUS
1.0000 mg/kg | Freq: Once | INTRAVENOUS | Status: AC
  Administered 2021-10-17: 20 mg via INTRAVENOUS
  Filled 2021-10-17: qty 20

## 2021-10-17 MED ORDER — LOSARTAN POTASSIUM 50 MG PO TABS
50.0000 mg | ORAL_TABLET | Freq: Every day | ORAL | Status: DC
  Administered 2021-10-17: 50 mg via ORAL
  Filled 2021-10-17: qty 1

## 2021-10-17 MED ORDER — METOPROLOL SUCCINATE ER 25 MG PO TB24
50.0000 mg | ORAL_TABLET | Freq: Every day | ORAL | Status: DC
  Administered 2021-10-17: 50 mg via ORAL
  Filled 2021-10-17: qty 2

## 2021-10-17 MED ORDER — SERTRALINE HCL 50 MG PO TABS: 50.0000 mg | ORAL_TABLET | Freq: Every day | ORAL | Status: DC

## 2021-10-17 MED ORDER — QUETIAPINE FUMARATE 25 MG PO TABS
50.0000 mg | ORAL_TABLET | Freq: Every day | ORAL | Status: DC
  Administered 2021-10-17: 50 mg via ORAL
  Filled 2021-10-17: qty 2

## 2021-10-17 MED ORDER — ATORVASTATIN CALCIUM 40 MG PO TABS
80.0000 mg | ORAL_TABLET | Freq: Every day | ORAL | Status: DC
  Administered 2021-10-17: 80 mg via ORAL
  Filled 2021-10-17: qty 2

## 2021-10-17 MED ORDER — ASPIRIN 81 MG PO TBEC
81.0000 mg | DELAYED_RELEASE_TABLET | Freq: Every day | ORAL | Status: DC
  Administered 2021-10-17: 81 mg via ORAL
  Filled 2021-10-17: qty 1

## 2021-10-17 MED ORDER — APIXABAN 5 MG PO TABS
5.0000 mg | ORAL_TABLET | Freq: Two times a day (BID) | ORAL | Status: DC
  Administered 2021-10-17: 5 mg via ORAL
  Filled 2021-10-17: qty 1

## 2021-10-17 MED ORDER — PROPOFOL 10 MG/ML IV BOLUS
INTRAVENOUS | Status: AC | PRN
  Administered 2021-10-17: 20 mg via INTRAVENOUS

## 2021-10-17 MED ORDER — ACETAMINOPHEN 500 MG PO TABS
1000.0000 mg | ORAL_TABLET | Freq: Once | ORAL | Status: AC
  Administered 2021-10-17: 1000 mg via ORAL
  Filled 2021-10-17: qty 2

## 2021-10-17 MED ORDER — BUPROPION HCL 75 MG PO TABS
75.0000 mg | ORAL_TABLET | Freq: Two times a day (BID) | ORAL | Status: DC
  Administered 2021-10-17: 75 mg via ORAL
  Filled 2021-10-17 (×3): qty 1

## 2021-10-17 MED ORDER — MEMANTINE HCL 10 MG PO TABS
10.0000 mg | ORAL_TABLET | Freq: Two times a day (BID) | ORAL | Status: DC
  Administered 2021-10-17 (×2): 10 mg via ORAL
  Filled 2021-10-17 (×2): qty 1

## 2021-10-17 MED ORDER — FENTANYL CITRATE PF 50 MCG/ML IJ SOSY
50.0000 ug | PREFILLED_SYRINGE | Freq: Once | INTRAMUSCULAR | Status: AC
  Administered 2021-10-17: 50 ug via INTRAVENOUS
  Filled 2021-10-17: qty 1

## 2021-10-17 MED ORDER — POTASSIUM CHLORIDE CRYS ER 10 MEQ PO TBCR
10.0000 meq | EXTENDED_RELEASE_TABLET | Freq: Every day | ORAL | Status: DC
Start: 2021-10-17 — End: 2021-10-17
  Administered 2021-10-17: 10 meq via ORAL
  Filled 2021-10-17: qty 1

## 2021-10-17 NOTE — ED Triage Notes (Signed)
Patient BIB family for evaluation of right foot pain. Patient unsure if she sustained and injury.

## 2021-10-17 NOTE — ED Notes (Signed)
RN updated daughter and daughter, [name] is on the way to pick her up

## 2021-10-17 NOTE — ED Provider Notes (Signed)
Seen after prior EDP.  Patient is able to stand and ambulate without difficulty at time of discharge.  She is conversant and follows directions appropriately.  Patient has been evaluated by [facility] / social work.  Patient without indication for admission and/or placement.   Valarie Merino, MD 10/17/21 [phone]

## 2021-10-17 NOTE — ED Triage Notes (Addendum)
Pt comes from home via [facility] for hallucinations after getting ativan. Pt does not know why she is here, denies SI/HI/AVH.

## 2021-10-17 NOTE — ED Provider Triage Note (Signed)
Emergency Medicine Provider Triage Evaluation Note  Marisa Nunez , a 76 y.o. female  was evaluated in triage.  Pt complains of hallucinations.  Family apparently called EMS due to hallucinations.  Patient does not understand why she is here.  Denies SI/HI.  She does have hx of dementia.  Review of Systems  Positive: Hallucinations. Negative: fever  Physical Exam  BP 137/82   Pulse 62   Temp 98.5 F (36.9 C) (Oral)   Resp 16   LMP  (LMP Unknown)   SpO2 96%   Gen:   Awake, no distress   Resp:  Normal effort  MSK:   Moves extremities without difficulty  Other:  Pacing around in room, pleasantly confused  Medical Decision Making  Medically screening exam initiated at 1:42 AM.  Appropriate orders placed.  Marisa Nunez was informed that the remainder of the evaluation will be completed by another provider, this initial triage assessment does not replace that evaluation, and the importance of remaining in the ED until their evaluation is complete.  Hallucinations per family.  Seen here for same 10/15/21 with negative work-up including CT head, labs.  Patient does not understand why she is here.  Denies SI/HI.  Does have hx of dementia.   Larene Pickett, PA-C 10/17/21 [phone]

## 2021-10-17 NOTE — ED Provider Notes (Signed)
[facility] EMERGENCY DEPARTMENT Provider Note   CSN: [id number] Arrival date & time: 10/17/21  1626     History  Chief Complaint  Patient presents with   Foot Pain    Marisa Nunez is a 76 y.o. female.   Foot Pain  Patient is a 76 year old female presented emergency room today with complaints of right great toe pain.  Seems that she has frequent falls was seen yesterday evening/overnight for shoulder dislocation her shoulder was reduced but upon returning home after being discharged from the hospital she complained of some right great toe pain.  She was brought to the emergency room by her family members for this.  She denies any other issues presently.     Home Medications Prior to Admission medications   Medication Sig Start Date End Date Taking? Authorizing Provider  acetaminophen (TYLENOL) 325 MG tablet Take 650 mg by mouth every 6 (six) hours as needed for mild pain, moderate pain or fever.    [provider]  apixaban (ELIQUIS) 5 MG TABS tablet Take 1 tablet (5 mg total) by mouth 2 (two) times daily. 06/25/21   Dellis Filbert, MD  aspirin EC 81 MG tablet Take 81 mg by mouth daily. Swallow whole.    [provider]  atorvastatin (LIPITOR) 80 MG tablet TAKE 1 TABLET(80 MG) BY MOUTH DAILY 06/25/21   Dellis Filbert, MD  buPROPion [facility]) 75 MG tablet Take 1 tablet by mouth twice daily. 10/15/21   Dellis Filbert, MD  losartan (COZAAR) 50 MG tablet Take 1 tablet (50 mg total) by mouth daily. 06/25/21   Dellis Filbert, MD  memantine (NAMENDA) 10 MG tablet Take 1 tablet by mouth in the morning. Patient taking differently: Take 10 mg by mouth 2 (two) times daily. 09/06/21   Dellis Filbert, MD  metoprolol succinate (TOPROL-XL) 50 MG 24 hr tablet Take 1 tablet (50 mg total) by mouth daily. Take with or immediately following a meal. 06/25/21   Dellis Filbert, MD  Multiple Vitamins-Minerals (MULTIVITAMIN WITH MINERALS) tablet Take 1 tablet  by mouth daily.    [provider]  potassium chloride (KLOR-CON) 10 MEQ tablet Take 1 tablet by mouth daily. 10/17/21   Dellis Filbert, MD  sertraline (ZOLOFT) 50 MG tablet Take 1 tablet by mouth daily. 10/17/21   Dellis Filbert, MD      Allergies    Influenza virus vaccine, Myrbetriq [mirabegron er], Pneumococcal vaccine, and Ativan [lorazepam]    Review of Systems   Review of Systems  Physical Exam Updated Vital Signs BP (!) 157/70 (BP Location: Right Arm)   Pulse 60   Temp 98.3 F (36.8 C) (Oral)   Resp 18   LMP  (LMP Unknown)   SpO2 96%  Physical Exam Vitals and nursing note reviewed.  Constitutional:      General: She is not in acute distress.    Appearance: Normal appearance. She is not ill-appearing.  HENT:     Head: Normocephalic and atraumatic.  Eyes:     General: No scleral icterus.       Right eye: No discharge.        Left eye: No discharge.     Conjunctiva/sclera: Conjunctivae normal.  Pulmonary:     Effort: Pulmonary effort is normal.     Breath sounds: No stridor.  Musculoskeletal:     Comments: Patient with tenderness and bruising of the right great toe.  No subungual hematoma.  Sensation intact and able to wiggle toes.  Cap refill less  than 2 seconds  Pain shoulder sling  Neurological:     Mental Status: She is alert and oriented to person, place, and time. Mental status is at baseline.     ED Results / Procedures / Treatments   Labs (all labs ordered are listed, but only abnormal results are displayed) Labs Reviewed - No data to display  EKG None  Radiology DG Foot Complete Right  Result Date: 10/17/2021 CLINICAL DATA:  Foot pain. EXAM: RIGHT FOOT COMPLETE - 3+ VIEW COMPARISON:  None FINDINGS: Subtle, nondisplaced fracture deformity involving the mid and distal shaft of the first proximal phalanx is suspected. The fracture fragments are nondisplaced. No additional signs of acute fracture or dislocation. There is a small calcific  density identified within the soft tissues adjacent to the third middle phalanx. Most likely chronic. IMPRESSION: 1. Subtle, nondisplaced fracture deformity involving the mid and distal shaft of the first proximal phalanx. 2. Chronic appearing calcific density within the soft tissues adjacent to the third middle phalanx. Electronically Signed   By: Signa Kell M.D.   On: 10/17/2021 17:17   DG Shoulder Right Portable  Result Date: 10/17/2021 CLINICAL DATA:  Post reduction. EXAM: RIGHT SHOULDER - 1 VIEW COMPARISON:  Earlier same day FINDINGS: Single AP view of the right shoulder shows apparent interval reduction of the shoulder dislocation although glenohumeral relationship is not well evaluated on AP imaging. IMPRESSION: Probable reduction of the humeral head dislocation. If clinical picture is equivocal, consider scapular Y-view or axillary projection to confirm. Electronically Signed   By: Kennith Center M.D.   On: 10/17/2021 07:17   DG Shoulder Right  Result Date: 10/17/2021 CLINICAL DATA:  Post reduction. EXAM: RIGHT SHOULDER - 2+ VIEW COMPARISON:  Earlier same day FINDINGS: Two view study. Persistent anterior subcoracoid dislocation of the humeral head evident. Osseous fragments are seen adjacent to the posterior glenoid in appear corticated suggesting chronicity. Acromioclavicular and coracoclavicular distances are preserved. IMPRESSION: 1. Persistent anterior dislocation of the right humeral head. 2. Osseous fragments adjacent to the posterior glenoid, likely chronic. Electronically Signed   By: Kennith Center M.D.   On: 10/17/2021 06:00   CT HEAD WO CONTRAST ( )  Result Date: 10/17/2021 CLINICAL DATA:  Head trauma, minor. Altered mental status with hallucination after Ativan. EXAM: CT HEAD WITHOUT CONTRAST TECHNIQUE: Contiguous axial images were obtained from the base of the skull through the vertex without intravenous contrast. RADIATION DOSE REDUCTION: This exam was performed according to the  departmental dose-optimization program which includes automated exposure control, adjustment of the mA and/or kV according to patient size and/or use of iterative reconstruction technique. COMPARISON:  Two days ago FINDINGS: Brain: No evidence of acute infarction, hemorrhage, hydrocephalus, extra-axial collection or mass lesion/mass effect. Cerebral volume loss and chronic small vessel ischemia in the deep white matter, mild for age. Vascular: No hyperdense vessel or unexpected calcification. Skull: Normal. Negative for fracture or focal lesion. Sinuses/Orbits: No acute finding. IMPRESSION: Aging brain without acute or interval finding. Electronically Signed   By: Tiburcio Pea M.D.   On: 10/17/2021 03:59   DG Shoulder Right  Result Date: 10/17/2021 CLINICAL DATA:  Fall from bed. EXAM: RIGHT SHOULDER - 2+ VIEW COMPARISON:  Two days ago FINDINGS: Anterior glenohumeral dislocation. Bone fragments at the posterior glenoid appear corticated and are favored chronic/degenerative. Suspect these were superimposed on the humeral head on prior frontal radiograph as well. These will be re-evaluated at follow-up. Degenerative spurring at the glenohumeral and acromioclavicular joints. Generalized osteopenia. IMPRESSION: 1. Anterior  glenohumeral dislocation. 2. Bone fragments at the posterior glenoid appear corticated and are favored chronic/degenerative. Electronically Signed   By: Tiburcio Pea M.D.   On: 10/17/2021 03:57    Procedures Procedures    Medications Ordered in ED Medications - No data to display  ED Course/ Medical Decision Making/ A&P                           Medical Decision Making  Patient is a 76 year old female presented emergency room today with complaints of right great toe pain.  Seems that she has frequent falls was seen yesterday evening/overnight for shoulder dislocation her shoulder was reduced but upon returning home after being discharged from the hospital she complained of some  right great toe pain.  She was brought to the emergency room by her family members for this.  She denies any other issues presently.  Physical exam with right great toe bruising and tenderness.  I personally reviewed images of x-rays patient has a fracture of the right great toe this correlates with her symptoms.  We will obtain hard postop shoe and discharged home with close follow-up with PCP and orthopedics.  Family at bedside and I discussed the evident need for patient to be considered for nursing home on the outpatient basis.  Final Clinical Impression(s) / ED Diagnoses Final diagnoses:  Closed displaced fracture of proximal phalanx of right great toe, initial encounter    Rx / DC Orders ED Discharge Orders     None         Gailen Shelter, [place] 10/17/21 2242    Terald Sleeper, MD 10/18/21 1050

## 2021-10-17 NOTE — Discharge Instructions (Addendum)
Return for any problem.   Follow up with orthopedics (Dr. Marlou Sa) for further outpatient care of your shoulder that was dislocated.

## 2021-10-17 NOTE — ED Notes (Signed)
Pt refused crutches.

## 2021-10-17 NOTE — ED Notes (Signed)
EDP at bedside to put shoulder back in. Pt tolerated well.

## 2021-10-17 NOTE — ED Notes (Signed)
MD notified on pts BP

## 2021-10-17 NOTE — ED Notes (Signed)
Patient transported to CT 

## 2021-10-17 NOTE — Discharge Instructions (Addendum)
You have a fracture of your right great toe.  I have given you a shoe that will help with the discomfort of walking however given your frequent falls I think that you should likely be assisted everywhere you go ideally with 1 person on either side of you to make sure you do not fall.  Tylenol 1000 mg every 6 hours for pain.  Ice and elevate your foot you may follow-up with either an orthopedist or a podiatrist.  I actually recommend a podiatrist because people over 37 years old to do better and have fewer falls when they have a podiatrist that they see.

## 2021-10-17 NOTE — ED Notes (Signed)
Pt is resting currently.

## 2021-10-19 ENCOUNTER — Emergency Department (HOSPITAL_COMMUNITY): Payer: Medicare Other

## 2021-10-19 ENCOUNTER — Other Ambulatory Visit: Payer: Self-pay

## 2021-10-19 ENCOUNTER — Encounter (HOSPITAL_COMMUNITY): Payer: Self-pay

## 2021-10-19 ENCOUNTER — Inpatient Hospital Stay (HOSPITAL_COMMUNITY)
Admission: EM | Admit: 2021-10-19 | Discharge: 2021-10-24 | DRG: 483 | Disposition: A | Discharge status: Skilled Nursing Facility | Payer: Medicare Other | Attending: Internal Medicine | Admitting: Internal Medicine

## 2021-10-19 DIAGNOSIS — S92414A Nondisplaced fracture of proximal phalanx of right great toe, initial encounter for closed fracture: Secondary | ICD-10-CM | POA: Diagnosis present

## 2021-10-19 DIAGNOSIS — E876 Hypokalemia: Secondary | ICD-10-CM | POA: Diagnosis present

## 2021-10-19 DIAGNOSIS — Z90710 Acquired absence of both cervix and uterus: Secondary | ICD-10-CM

## 2021-10-19 DIAGNOSIS — F0392 Unspecified dementia, unspecified severity, with psychotic disturbance: Secondary | ICD-10-CM | POA: Diagnosis present

## 2021-10-19 DIAGNOSIS — Z8673 Personal history of transient ischemic attack (TIA), and cerebral infarction without residual deficits: Secondary | ICD-10-CM

## 2021-10-19 DIAGNOSIS — W06XXXA Fall from bed, initial encounter: Secondary | ICD-10-CM | POA: Diagnosis present

## 2021-10-19 DIAGNOSIS — F32A Depression, unspecified: Secondary | ICD-10-CM | POA: Diagnosis present

## 2021-10-19 DIAGNOSIS — Z79899 Other long term (current) drug therapy: Secondary | ICD-10-CM

## 2021-10-19 DIAGNOSIS — S42141A Displaced fracture of glenoid cavity of scapula, right shoulder, initial encounter for closed fracture: Secondary | ICD-10-CM | POA: Diagnosis present

## 2021-10-19 DIAGNOSIS — Y92009 Unspecified place in unspecified non-institutional (private) residence as the place of occurrence of the external cause: Secondary | ICD-10-CM | POA: Diagnosis not present

## 2021-10-19 DIAGNOSIS — F17210 Nicotine dependence, cigarettes, uncomplicated: Secondary | ICD-10-CM | POA: Diagnosis present

## 2021-10-19 DIAGNOSIS — Z7982 Long term (current) use of aspirin: Secondary | ICD-10-CM | POA: Diagnosis not present

## 2021-10-19 DIAGNOSIS — Z7901 Long term (current) use of anticoagulants: Secondary | ICD-10-CM | POA: Diagnosis not present

## 2021-10-19 DIAGNOSIS — F0390 Unspecified dementia without behavioral disturbance: Secondary | ICD-10-CM

## 2021-10-19 DIAGNOSIS — R296 Repeated falls: Secondary | ICD-10-CM | POA: Diagnosis present

## 2021-10-19 DIAGNOSIS — Z66 Do not resuscitate: Secondary | ICD-10-CM | POA: Diagnosis present

## 2021-10-19 DIAGNOSIS — S43006A Unspecified dislocation of unspecified shoulder joint, initial encounter: Secondary | ICD-10-CM | POA: Diagnosis present

## 2021-10-19 DIAGNOSIS — Z888 Allergy status to other drugs, medicaments and biological substances status: Secondary | ICD-10-CM

## 2021-10-19 DIAGNOSIS — F419 Anxiety disorder, unspecified: Secondary | ICD-10-CM | POA: Diagnosis present

## 2021-10-19 DIAGNOSIS — E785 Hyperlipidemia, unspecified: Secondary | ICD-10-CM | POA: Diagnosis present

## 2021-10-19 DIAGNOSIS — S42151A Displaced fracture of neck of scapula, right shoulder, initial encounter for closed fracture: Secondary | ICD-10-CM

## 2021-10-19 DIAGNOSIS — E871 Hypo-osmolality and hyponatremia: Secondary | ICD-10-CM | POA: Diagnosis present

## 2021-10-19 DIAGNOSIS — F0394 Unspecified dementia, unspecified severity, with anxiety: Secondary | ICD-10-CM | POA: Diagnosis present

## 2021-10-19 DIAGNOSIS — F0393 Unspecified dementia, unspecified severity, with mood disturbance: Secondary | ICD-10-CM | POA: Diagnosis present

## 2021-10-19 DIAGNOSIS — J449 Chronic obstructive pulmonary disease, unspecified: Secondary | ICD-10-CM | POA: Diagnosis present

## 2021-10-19 DIAGNOSIS — I110 Hypertensive heart disease with heart failure: Secondary | ICD-10-CM | POA: Diagnosis not present

## 2021-10-19 DIAGNOSIS — I480 Paroxysmal atrial fibrillation: Secondary | ICD-10-CM | POA: Diagnosis present

## 2021-10-19 DIAGNOSIS — S43004A Unspecified dislocation of right shoulder joint, initial encounter: Secondary | ICD-10-CM | POA: Diagnosis not present

## 2021-10-19 DIAGNOSIS — M24411 Recurrent dislocation, right shoulder: Principal | ICD-10-CM | POA: Diagnosis present

## 2021-10-19 DIAGNOSIS — I509 Heart failure, unspecified: Secondary | ICD-10-CM | POA: Diagnosis not present

## 2021-10-19 DIAGNOSIS — Z91199 Patient's noncompliance with other medical treatment and regimen due to unspecified reason: Secondary | ICD-10-CM

## 2021-10-19 DIAGNOSIS — I4891 Unspecified atrial fibrillation: Secondary | ICD-10-CM | POA: Diagnosis not present

## 2021-10-19 DIAGNOSIS — S143XXA Injury of brachial plexus, initial encounter: Secondary | ICD-10-CM | POA: Diagnosis present

## 2021-10-19 DIAGNOSIS — I10 Essential (primary) hypertension: Secondary | ICD-10-CM | POA: Diagnosis present

## 2021-10-19 DIAGNOSIS — S92911A Unspecified fracture of right toe(s), initial encounter for closed fracture: Secondary | ICD-10-CM | POA: Diagnosis present

## 2021-10-19 DIAGNOSIS — R5381 Other malaise: Secondary | ICD-10-CM | POA: Diagnosis present

## 2021-10-19 DIAGNOSIS — I4819 Other persistent atrial fibrillation: Secondary | ICD-10-CM | POA: Diagnosis present

## 2021-10-19 LAB — CBC
HCT: 38.7 % (ref 36.0–46.0)
Hemoglobin: 12.5 g/dL (ref 12.0–15.0)
MCH: 28.1 pg (ref 26.0–34.0)
MCHC: 32.3 g/dL (ref 30.0–36.0)
MCV: 87 fL (ref 80.0–100.0)
Platelets: 311 10*3/uL (ref 150–400)
RBC: 4.45 MIL/uL (ref 3.87–5.11)
RDW: 13.9 % (ref 11.5–15.5)
WBC: 10.4 10*3/uL (ref 4.0–10.5)
nRBC: 0 % (ref 0.0–0.2)

## 2021-10-19 LAB — BASIC METABOLIC PANEL
Anion gap: 7 (ref 5–15)
BUN: 14 mg/dL (ref 8–23)
CO2: 24 mmol/L (ref 22–32)
Calcium: 9.4 mg/dL (ref 8.9–10.3)
Chloride: 108 mmol/L (ref 98–111)
Creatinine, Ser: 0.68 mg/dL (ref 0.44–1.00)
GFR, Estimated: >60 mL/min (ref >60)
Glucose, Bld: 96 mg/dL (ref 70–99)
Potassium: 3.1 mmol/L — ABNORMAL LOW (ref 3.5–5.1)
Sodium: 139 mmol/L (ref 135–145)

## 2021-10-19 MED ORDER — ONDANSETRON HCL 4 MG/2ML IJ SOLN
4.0000 mg | Freq: Once | INTRAMUSCULAR | Status: AC
  Administered 2021-10-19: 4 mg via INTRAVENOUS
  Filled 2021-10-19: qty 2

## 2021-10-19 MED ORDER — BUPROPION HCL 75 MG PO TABS
75.0000 mg | ORAL_TABLET | Freq: Two times a day (BID) | ORAL | Status: DC
  Administered 2021-10-20 – 2021-10-24 (×8): 75 mg via ORAL
  Filled 2021-10-19 (×10): qty 1

## 2021-10-19 MED ORDER — SERTRALINE HCL 50 MG PO TABS
50.0000 mg | ORAL_TABLET | Freq: Every day | ORAL | Status: DC
  Administered 2021-10-20 – 2021-10-24 (×5): 50 mg via ORAL
  Filled 2021-10-19 (×5): qty 1

## 2021-10-19 MED ORDER — MEMANTINE HCL 10 MG PO TABS
10.0000 mg | ORAL_TABLET | Freq: Two times a day (BID) | ORAL | Status: DC
  Administered 2021-10-20 – 2021-10-24 (×8): 10 mg via ORAL
  Filled 2021-10-19 (×8): qty 1

## 2021-10-19 MED ORDER — LOSARTAN POTASSIUM 50 MG PO TABS
50.0000 mg | ORAL_TABLET | Freq: Every day | ORAL | Status: DC
  Administered 2021-10-20 – 2021-10-24 (×5): 50 mg via ORAL
  Filled 2021-10-19 (×5): qty 1

## 2021-10-19 MED ORDER — METOPROLOL SUCCINATE ER 50 MG PO TB24
50.0000 mg | ORAL_TABLET | Freq: Every day | ORAL | Status: DC
  Administered 2021-10-20 – 2021-10-24 (×5): 50 mg via ORAL
  Filled 2021-10-19 (×5): qty 1

## 2021-10-19 MED ORDER — ACETAMINOPHEN 325 MG PO TABS
650.0000 mg | ORAL_TABLET | Freq: Four times a day (QID) | ORAL | Status: DC | PRN
  Administered 2021-10-20 (×2): 650 mg via ORAL
  Filled 2021-10-19 (×2): qty 2

## 2021-10-19 MED ORDER — ACETAMINOPHEN 650 MG RE SUPP: 650.0000 mg | Freq: Four times a day (QID) | RECTAL | Status: DC | PRN

## 2021-10-19 MED ORDER — POTASSIUM CHLORIDE CRYS ER 20 MEQ PO TBCR
40.0000 meq | EXTENDED_RELEASE_TABLET | Freq: Once | ORAL | Status: AC
  Administered 2021-10-19: 40 meq via ORAL
  Filled 2021-10-19: qty 2

## 2021-10-19 MED ORDER — MORPHINE SULFATE (PF) 2 MG/ML IV SOLN
1.0000 mg | INTRAVENOUS | Status: DC | PRN
  Administered 2021-10-20 – 2021-10-21 (×3): 1 mg via INTRAVENOUS
  Filled 2021-10-19 (×4): qty 1

## 2021-10-19 MED ORDER — PROPOFOL 10 MG/ML IV BOLUS
1.0000 mg/kg | Freq: Once | INTRAVENOUS | Status: AC
  Administered 2021-10-19: 56.7 mg via INTRAVENOUS
  Filled 2021-10-19: qty 20

## 2021-10-19 MED ORDER — PROPOFOL 10 MG/ML IV BOLUS: 0.5000 mg/kg | Freq: Once | INTRAVENOUS | Status: DC

## 2021-10-19 MED ORDER — PROPOFOL 10 MG/ML IV BOLUS
0.5000 mg/kg | Freq: Once | INTRAVENOUS | Status: AC
  Administered 2021-10-19: 28.4 mg via INTRAVENOUS

## 2021-10-19 MED ORDER — IPRATROPIUM-ALBUTEROL 0.5-2.5 (3) MG/3ML IN SOLN
3.0000 mL | Freq: Four times a day (QID) | RESPIRATORY_TRACT | Status: DC | PRN
Start: 2021-10-19 — End: 2021-10-24

## 2021-10-19 MED ORDER — HALOPERIDOL 1 MG PO TABS
2.0000 mg | ORAL_TABLET | Freq: Once | ORAL | Status: AC
Start: 2021-10-19 — End: 2021-10-19
  Administered 2021-10-19: 2 mg via ORAL
  Filled 2021-10-19: qty 2

## 2021-10-19 MED ORDER — ATORVASTATIN CALCIUM 80 MG PO TABS
80.0000 mg | ORAL_TABLET | Freq: Every day | ORAL | Status: DC
  Administered 2021-10-20 – 2021-10-24 (×5): 80 mg via ORAL
  Filled 2021-10-19: qty 2
  Filled 2021-10-19 (×4): qty 1

## 2021-10-19 NOTE — ED Provider Notes (Addendum)
75 year old female with history of hypertension, PAF on Eliquis, last dose this morning.  Dementia with behavioral disturbance to the ED secondary to right shoulder injury.  This is patient's fourth ED visit in the past the past week secondary to right shoulder pain, concern for dislocation.  Patient with frequent falls, also in pain to her right foot.  Patient did have shoulder reduction 2 days ago, she has been noncompliant with her shoulder sling/immobilizer is having ongoing pain to the right shoulder.  Dislocation appears to have initially been provoked by falling out of bed.  Attempted reduction at bedside, unable to reduce shoulder appropriately.  Shoulder will temporarily reduce and then will dislocate again.  Glenoid appears very unstable.  Radial pulses are palpable.  Pain is mild to her right shoulder.  Discussed with orthopedic surgery Dr. Everardo Pacific, recommend CT of the shoulder.  Concern for glenoid fracture.  Patient is on Eliquis, last dose was yesterday morning.  Surgery unable to complete until Monday.  He recommends admission to the hospital service.  Orthopedic to evaluate formally in the morning.  Plan for surgery Monday morning. Hold eliquis until then.   Labs reviewed and are stable, she has mild hypokalemia, replaced orally  We will discuss with hospitalist regarding admission.  Discussed with Dr. Loney Loh who accepts patient for admission    .Sedation  Date/Time: 10/19/2021 7:30 PM  Performed by: Sloan Leiter, DO Authorized by: Sloan Leiter, DO   Consent:    Consent obtained:  Verbal   Consent given by:  Patient and healthcare agent   Risks discussed:  Allergic reaction, dysrhythmia, inadequate sedation and nausea   Alternatives discussed:  Analgesia without sedation and anxiolysis Universal protocol:    Procedure explained and questions answered to patient or proxy's satisfaction: yes     Immediately prior to procedure, a time out was called: yes     Patient identity  confirmed:  Arm band and verbally with patient Indications:    Procedure performed:  Dislocation reduction   Procedure necessitating sedation performed by:  Physician performing sedation Pre-sedation assessment:    Time since last food or drink:  5 hours   ASA classification: class 2 - patient with mild systemic disease     Mouth opening:  3 or more finger widths   Thyromental distance:  3 finger widths   Mallampati score:  I - soft palate, uvula, fauces, pillars visible   Neck mobility: normal     Pre-sedation assessments completed and reviewed: airway patency, cardiovascular function, hydration status, mental status, nausea/vomiting, pain level, respiratory function and temperature     Pre-sedation assessment completed:  10/19/2021 7:30 PM Immediate pre-procedure details:    Reassessment: Patient reassessed immediately prior to procedure     Reviewed: vital signs, relevant labs/tests and NPO status     Verified: bag valve mask available, emergency equipment available, intubation equipment available, IV patency confirmed, oxygen available and suction available   Procedure details (see MAR for exact dosages):    Preoxygenation:  Nasal cannula   Sedation:  Propofol   Intended level of sedation: deep   Intra-procedure monitoring:  Blood pressure monitoring, cardiac monitor, frequent LOC assessments, frequent vital sign checks, continuous pulse oximetry and continuous capnometry   Intra-procedure events: none     Total Provider sedation time (minutes):  15 Post-procedure details:    Post-sedation assessment completed:  10/19/2021 8:20 PM   Attendance: Constant attendance by certified staff until patient recovered     Recovery: Patient returned to  pre-procedure baseline     Post-sedation assessments completed and reviewed: airway patency, cardiovascular function, hydration status, mental status, nausea/vomiting, pain level, respiratory function and temperature     Patient is stable for  discharge or admission: yes     Procedure completion:  Tolerated well, no immediate complications Reduction of dislocation  Date/Time: 10/19/2021 9:00 PM  Performed by: Sloan Leiter, DO Authorized by: Sloan Leiter, DO  Consent: Verbal consent obtained. Risks and benefits: risks, benefits and alternatives were discussed Consent given by: patient (family at bedside) Required items: required blood products, implants, devices, and special equipment available Patient identity confirmed: verbally with patient and hospital-assigned identification number Time out: Immediately prior to procedure a "time out" was called to verify the correct patient, procedure, equipment, support staff and site/side marked as required.  Sedation: Patient sedated: yes Sedatives: propofol Vitals: Vital signs were monitored during sedation.  Patient tolerance: patient tolerated the procedure well with no immediate complications Comments: See sedation note        CRITICAL CARE Performed by: Sloan Leiter   Total critical care time: 34 minutes  Critical care time was exclusive of separately billable procedures and treating other patients.  Critical care was necessary to treat or prevent imminent or life-threatening deterioration.  Critical care was time spent personally by me on the following activities: development of treatment plan with patient and/or surrogate as well as nursing, discussions with consultants, evaluation of patient's response to treatment, examination of patient, obtaining history from patient or surrogate, ordering and performing treatments and interventions, ordering and review of laboratory studies, ordering and review of radiographic studies, pulse oximetry and re-evaluation of patient's condition. Procedural sedation, dislocation reduction    Sloan Leiter, DO 10/19/21 2335    Sloan Leiter, DO 10/21/21 0008

## 2021-10-19 NOTE — Consult Note (Signed)
Orthopedics consulted for Right shoulder dislocation, subacute.  Upon viewing imaging from 2 days ago patient likely still had a persistent dislocation even after reduction attempts.  Despite reduction attempts today patient reportedly is reducible but re-dislocates.  CT demonstrates anterior glenoid fracture and hill sachs.  Recommend reverse total shoulder arthroplasty.  With recent anticoagulation (today 6/24 am) and due to complexity of case will post for Monday 6/26.    Recommend hospitalist admission and optimization, hold anticoagulation and NPO at midnight for surgery Monday.

## 2021-10-19 NOTE — ED Provider Notes (Signed)
[facility]-EMERGENCY DEPT Provider Note   CSN: [id number] Arrival date & time: 10/19/21  1452     History  Chief Complaint  Patient presents with   Shoulder Injury    Marisa Nunez is a 76 y.o. female.  Patient presents to the emergency department for evaluation of right shoulder deformity and pain.  Patient was seen in the emergency department over the past several days for agitation related to her underlying dementia as well as shoulder dislocation and a broken foot.  She has been poorly compliant at home with her sling and has been moving her arm per family members.  Today family noted that the shoulder appeared deformed again, prompting emergency department visit.  Patient has some pain with movement.  No distal numbness or tingling.  The first dislocation happened after she fell out of bed.  She has never had shoulder surgery or a shoulder dislocation per family.  No treatments prior to arrival.       Home Medications Prior to Admission medications   Medication Sig Start Date End Date Taking? Authorizing Provider  acetaminophen (TYLENOL) 325 MG tablet Take 650 mg by mouth every 6 (six) hours as needed for mild pain, moderate pain or fever.    [provider]  apixaban (ELIQUIS) 5 MG TABS tablet Take 1 tablet (5 mg total) by mouth 2 (two) times daily. 06/25/21   Dellis Filbert, MD  aspirin EC 81 MG tablet Take 81 mg by mouth daily. Swallow whole.    [provider]  atorvastatin (LIPITOR) 80 MG tablet TAKE 1 TABLET(80 MG) BY MOUTH DAILY 06/25/21   Dellis Filbert, MD  buPROPion [facility]) 75 MG tablet Take 1 tablet by mouth twice daily. 10/15/21   Dellis Filbert, MD  losartan (COZAAR) 50 MG tablet Take 1 tablet (50 mg total) by mouth daily. 06/25/21   Dellis Filbert, MD  memantine (NAMENDA) 10 MG tablet Take 1 tablet by mouth in the morning. Patient taking differently: Take 10 mg by mouth 2 (two) times daily. 09/06/21   Dellis Filbert,  MD  metoprolol succinate (TOPROL-XL) 50 MG 24 hr tablet Take 1 tablet (50 mg total) by mouth daily. Take with or immediately following a meal. 06/25/21   Dellis Filbert, MD  Multiple Vitamins-Minerals (MULTIVITAMIN WITH MINERALS) tablet Take 1 tablet by mouth daily.    [provider]  potassium chloride (KLOR-CON) 10 MEQ tablet Take 1 tablet by mouth daily. 10/17/21   Dellis Filbert, MD  sertraline (ZOLOFT) 50 MG tablet Take 1 tablet by mouth daily. 10/17/21   Dellis Filbert, MD      Allergies    Influenza virus vaccine, Myrbetriq [mirabegron er], Pneumococcal vaccine, and Ativan [lorazepam]    Review of Systems   Review of Systems  Physical Exam Updated Vital Signs BP (!) 200/78 (BP Location: Left Arm)   Pulse 61   Temp 97.8 F (36.6 C) (Oral)   Resp 18   LMP  (LMP Unknown)   SpO2 100%   Physical Exam Vitals and nursing note reviewed.  Constitutional:      Appearance: She is well-developed.  HENT:     Head: Normocephalic and atraumatic.  Eyes:     Pupils: Pupils are equal, round, and reactive to light.  Cardiovascular:     Pulses: Normal pulses. No decreased pulses.  Musculoskeletal:        General: Tenderness present.     Right shoulder: Swelling, effusion, tenderness and bony tenderness present. Decreased range of motion.  Right elbow: Normal range of motion.     Cervical back: Normal range of motion and neck supple.  Skin:    General: Skin is warm and dry.  Neurological:     Mental Status: She is alert.     Sensory: No sensory deficit.     Comments: Motor, sensation, and vascular distal to the injury is fully intact.   Psychiatric:        Mood and Affect: Mood normal.     ED Results / Procedures / Treatments   Labs (all labs ordered are listed, but only abnormal results are displayed) Labs Reviewed - No data to display  EKG None  Radiology DG Shoulder Right  Result Date: 10/19/2021 CLINICAL DATA:  Right shoulder pain and dislocation 2  days ago. EXAM: RIGHT SHOULDER - 2+ VIEW COMPARISON:  10/17/2021 FINDINGS: Anterior dislocation of the right shoulder is again seen. No acute fracture identified. IMPRESSION: Anterior dislocation of right shoulder. No acute fracture identified. Electronically Signed   By: Danae Orleans M.D.   On: 10/19/2021 16:03   DG Foot Complete Right  Result Date: 10/17/2021 CLINICAL DATA:  Foot pain. EXAM: RIGHT FOOT COMPLETE - 3+ VIEW COMPARISON:  None FINDINGS: Subtle, nondisplaced fracture deformity involving the mid and distal shaft of the first proximal phalanx is suspected. The fracture fragments are nondisplaced. No additional signs of acute fracture or dislocation. There is a small calcific density identified within the soft tissues adjacent to the third middle phalanx. Most likely chronic. IMPRESSION: 1. Subtle, nondisplaced fracture deformity involving the mid and distal shaft of the first proximal phalanx. 2. Chronic appearing calcific density within the soft tissues adjacent to the third middle phalanx. Electronically Signed   By: Signa Kell M.D.   On: 10/17/2021 17:17    Procedures Procedures    Medications Ordered in ED Medications - No data to display  ED Course/ Medical Decision Making/ A&P   Patient seen and examined. History obtained directly from patient.   Labs/EKG: None ordered.   Imaging: Imaging of the shoulder personally reviewed and interpreted, agree right anterior dislocation of the shoulder.  Medications/Fluids: None ordered.  Most recent vital signs reviewed and are as follows: BP (!) 200/78 (BP Location: Left Arm)   Pulse 61   Temp 97.8 F (36.6 C) (Oral)   Resp 18   LMP  (LMP Unknown)   SpO2 100%   Initial impression: Recurrent shoulder dislocation.  7:48 PM Reassessment performed. Patient appears stable.   Awaiting completion of preparations for conscious sedation and shoulder reduction.   Most current vital signs reviewed and are as follows: BP (!)  189/94   Pulse (!) 58   Temp 97.8 F (36.6 C) (Oral)   Resp 17   LMP  (LMP Unknown)   SpO2 99%   Plan: Sedation  Care transferred to Dr. Wallace Cullens at shift change.   8:18 PM sedation/reduction performed per Dr. Gwenette Greet notes.                            Medical Decision Making Risk Prescription drug management.   Recurrent shoulder dislocation        Final Clinical Impression(s) / ED Diagnoses Final diagnoses:  Shoulder dislocation, right, initial encounter    Rx / DC Orders ED Discharge Orders     None         Renne Crigler, PA-C 10/19/21 2018    Sloan Leiter, DO 10/21/21 0003

## 2021-10-19 NOTE — ED Notes (Signed)
Pt took sling and swathe off this nurse replaced it and explained to patient why she needs it.

## 2021-10-20 ENCOUNTER — Other Ambulatory Visit: Payer: Self-pay

## 2021-10-20 DIAGNOSIS — S43004A Unspecified dislocation of right shoulder joint, initial encounter: Secondary | ICD-10-CM | POA: Diagnosis not present

## 2021-10-20 DIAGNOSIS — J449 Chronic obstructive pulmonary disease, unspecified: Secondary | ICD-10-CM | POA: Diagnosis not present

## 2021-10-20 DIAGNOSIS — I480 Paroxysmal atrial fibrillation: Secondary | ICD-10-CM

## 2021-10-20 DIAGNOSIS — F0390 Unspecified dementia without behavioral disturbance: Secondary | ICD-10-CM | POA: Diagnosis not present

## 2021-10-20 DIAGNOSIS — I10 Essential (primary) hypertension: Secondary | ICD-10-CM | POA: Diagnosis not present

## 2021-10-20 LAB — SURGICAL PCR SCREEN
MRSA, PCR: NEGATIVE
Staphylococcus aureus: NEGATIVE

## 2021-10-20 LAB — POTASSIUM: Potassium: 3.3 mmol/L — ABNORMAL LOW (ref 3.5–5.1)

## 2021-10-20 LAB — MAGNESIUM: Magnesium: 1.7 mg/dL (ref 1.7–2.4)

## 2021-10-20 MED ORDER — MAGNESIUM SULFATE 2 GM/50ML IV SOLN
2.0000 g | Freq: Once | INTRAVENOUS | Status: AC
Start: 2021-10-20 — End: 2021-10-20
  Administered 2021-10-20: 2 g via INTRAVENOUS
  Filled 2021-10-20: qty 50

## 2021-10-20 MED ORDER — MUPIROCIN 2 % EX OINT: 1.0000 | TOPICAL_OINTMENT | Freq: Two times a day (BID) | CUTANEOUS | Status: DC

## 2021-10-20 MED ORDER — QUETIAPINE FUMARATE 25 MG PO TABS
25.0000 mg | ORAL_TABLET | Freq: Every day | ORAL | Status: DC
  Administered 2021-10-20 – 2021-10-23 (×4): 25 mg via ORAL
  Filled 2021-10-20 (×4): qty 1

## 2021-10-20 MED ORDER — POTASSIUM CHLORIDE CRYS ER 20 MEQ PO TBCR
40.0000 meq | EXTENDED_RELEASE_TABLET | ORAL | Status: AC
  Administered 2021-10-20 (×2): 40 meq via ORAL
  Filled 2021-10-20 (×2): qty 2

## 2021-10-20 MED ORDER — HALOPERIDOL LACTATE 5 MG/ML IJ SOLN
5.0000 mg | Freq: Four times a day (QID) | INTRAMUSCULAR | Status: DC | PRN
  Administered 2021-10-20: 5 mg via INTRAVENOUS
  Filled 2021-10-20: qty 1

## 2021-10-20 NOTE — Progress Notes (Signed)
PROGRESS NOTE    Marisa Nunez  RUE:[id number] DOB: 01-Mar-1946 DOA: 10/19/2021 PCP: Dellis Filbert, MD    Brief Narrative:  76 year old female with a history of dementia, paroxysmal atrial fibrillation, hypertension, admitted to the hospital with right shoulder deformity and pain.  She is noted to have recurrent right shoulder dislocation.  She was admitted for operative management after being evaluated by orthopedics.  Her anticoagulation is currently on hold until after surgery.  Plans are for operative management 6/26.   Assessment & Plan:   Principal Problem:   Shoulder dislocation Active Problems:   Essential hypertension   PAF (paroxysmal atrial fibrillation) (HCC)   Dementia (HCC)   COPD (chronic obstructive pulmonary disease) (HCC)   Recurrent right shoulder dislocation -Orthopedics following -Plans for operative management on 6/26  Paroxysmal atrial fibrillation -Continue on metoprolol -Holding Eliquis until after surgery  Hypokalemia -Replace  COPD -No wheezing at this time -Continue as needed DuoNebs  History of stroke -Continue Lipitor -Resume Eliquis when able  Dementia -She does have some confusion but is otherwise pleasant -Continue on Namenda  Anxiety, depression -Continue on outpatient Zoloft  Hypertension -Continue losartan and metoprolol   DVT prophylaxis: SCDs Start: 10/19/21 2346  Code Status: DNR Family Communication: Discussed with patient's daughter, [name] at the bedside Disposition Plan: Status is: Inpatient Remains inpatient appropriate because: Needs operative management for shoulder     Consultants:  Orthopedics  Procedures:    Antimicrobials:      Subjective: Denies any pain in her right arm.  She knows she is in the hospital.  Objective: Vitals:   10/19/21 2301 10/20/21 0015 10/20/21 0058 10/20/21 0522  BP: (!) 155/92 (!) 169/63 (!) 178/69 (!) 157/87  Pulse: 69 65 65 77  Resp: 20 (!) 23 18 17   Temp:    98.9 F (37.2 C) 98.4 F (36.9 C)  TempSrc:   Oral Oral  SpO2: 100% 100% 100% 99%  Weight:   57.1 kg   Height:   5\' 4"  (1.626 m)     Intake/Output Summary (Last 24 hours) at 10/20/2021 1327 Last data filed at 10/20/2021 1000 Gross per 24 hour  Intake 240 ml  Output --  Net 240 ml   Filed Weights   10/20/21 0058  Weight: 57.1 kg    Examination:  General exam: Appears calm and comfortable  Respiratory system: Clear to auscultation. Respiratory effort normal. Cardiovascular system: S1 & S2 heard, RRR. No JVD, murmurs, rubs, gallops or clicks. No pedal edema. Gastrointestinal system: Abdomen is nondistended, soft and nontender. No organomegaly or masses felt. Normal bowel sounds heard. Central nervous system: No focal neurological deficits. Extremities: Right arm is in sling Skin: No rashes, lesions or ulcers Psychiatry: Pleasant, difficulty remembering some things    Data Reviewed: I have personally reviewed following labs and imaging studies  CBC: Recent Labs  Lab 10/15/21 1747 10/17/21 0128 10/19/21 2210  WBC 15.2* 11.6* 10.4  NEUTROABS  --  9.4*  --   HGB 13.5 13.1 12.5  HCT 40.8 39.7 38.7  MCV 85.4 85.9 87.0  PLT 325 308 311   Basic Metabolic Panel: Recent Labs  Lab 10/15/21 1747 10/17/21 0128 10/19/21 2210 10/20/21 0311  NA 134* 133* 139  --   K 4.1 3.3* 3.1* 3.3*  CL 99 100 108  --   CO2 24 23 24   --   GLUCOSE 94 127* 96  --   BUN 9 11 14   --   CREATININE 0.73 0.89 0.68  --  CALCIUM 9.6 9.2 9.4  --   MG  --   --   --  1.7   GFR: Estimated Creatinine Clearance: 54.1 mL/min (by C-G formula based on SCr of 0.68 mg/dL). Liver Function Tests: Recent Labs  Lab 10/15/21 1747 10/17/21 0128  AST 21 18  ALT 15 13  ALKPHOS 112 110  BILITOT 1.1 0.7  PROT 6.5 6.1*  ALBUMIN 3.9 3.7   No results for input(s): "LIPASE", "AMYLASE" in the last 168 hours. Recent Labs  Lab 10/15/21 1803  AMMONIA <10   Coagulation Profile: Recent Labs  Lab  10/15/21 1803  INR 1.2   Cardiac Enzymes: No results for input(s): "CKTOTAL", "CKMB", "CKMBINDEX", "TROPONINI" in the last 168 hours. BNP (last 3 results) No results for input(s): "PROBNP" in the last 8760 hours. HbA1C: No results for input(s): "HGBA1C" in the last 72 hours. CBG: No results for input(s): "GLUCAP" in the last 168 hours. Lipid Profile: No results for input(s): "CHOL", "HDL", "LDLCALC", "TRIG", "CHOLHDL", "LDLDIRECT" in the last 72 hours. Thyroid Function Tests: No results for input(s): "TSH", "T4TOTAL", "FREET4", "T3FREE", "THYROIDAB" in the last 72 hours. Anemia Panel: No results for input(s): "VITAMINB12", "FOLATE", "FERRITIN", "TIBC", "IRON", "RETICCTPCT" in the last 72 hours. Sepsis Labs: No results for input(s): "PROCALCITON", "LATICACIDVEN" in the last 168 hours.  Recent Results (from the past 240 hour(s))  Surgical PCR screen     Status: None   Collection Time: 10/20/21  1:23 AM   Specimen: Nasal Mucosa; Nasal Swab  Result Value Ref Range Status   MRSA, PCR NEGATIVE NEGATIVE Final   Staphylococcus aureus NEGATIVE NEGATIVE Final    Comment: (NOTE) The Xpert SA Assay (FDA approved for NASAL specimens in patients 87 years of age and older), is one component of a comprehensive surveillance program. It is not intended to diagnose infection nor to guide or monitor treatment. Performed at [facility], [address] [place], [place] [address]          Radiology Studies: CT Shoulder Right Wo Contrast  Result Date: 10/19/2021 CLINICAL DATA:  Shoulder dislocation, postreduction. EXAM: CT OF THE UPPER RIGHT EXTREMITY WITHOUT CONTRAST TECHNIQUE: Multidetector CT imaging of the upper right extremity was performed according to the standard protocol. RADIATION DOSE REDUCTION: This exam was performed according to the departmental dose-optimization program which includes automated exposure control, adjustment of the mA and/or kV according to  patient size and/or use of iterative reconstruction technique. COMPARISON:  10/19/2021. FINDINGS: Bones/Joint/Cartilage There is persistent anterior dislocation of the right humeral head. Mild degenerative changes are present at the acromioclavicular joint and moderate degenerative changes at the humeral head and glenoid. There is sclerosis at the humeral head which may be due to degenerative changes, the possibility of impaction fracture can not be completely excluded. Additional corticated and scattered bony densities are noted adjacent to the glenoid which may be acute or chronic. There is a large simple joint effusion about the shoulder containing bony densities. Ligaments Suboptimally assessed by CT. Muscles and Tendons No intramuscular hematoma. Soft tissues Mild fat stranding is noted in the axilla IMPRESSION: 1. Persistent anterior dislocation of the right humeral head. 2. Large joint effusion at the right shoulder containing bony densities, possible acute or chronic fracture fragments versus loose bodies. 3. Moderate degenerative changes at the humeral head and glenoid. There is sclerosis at the humeral head which may be related to underlying arthritis. The possibility of impaction fracture can not be completely excluded. 4. Mild degenerative changes at the acromioclavicular  joint. Electronically Signed   By: Thornell Sartorius M.D.   On: 10/19/2021 21:40   DG Shoulder Right Portable  Result Date: 10/19/2021 CLINICAL DATA:  Shoulder reduction. EXAM: RIGHT SHOULDER - 1 VIEW COMPARISON:  10/19/2021. FINDINGS: There is redemonstration of anterior dislocation of the right humeral head, unchanged from the prior exam. No acute fracture is seen. Degenerative changes are present at the acromioclavicular joint. The soft tissues are within normal limits. IMPRESSION: Persistent anterior dislocation of the right humeral head. Electronically Signed   By: Thornell Sartorius M.D.   On: 10/19/2021 20:37   DG Shoulder  Right  Result Date: 10/19/2021 CLINICAL DATA:  Right shoulder pain and dislocation 2 days ago. EXAM: RIGHT SHOULDER - 2+ VIEW COMPARISON:  10/17/2021 FINDINGS: Anterior dislocation of the right shoulder is again seen. No acute fracture identified. IMPRESSION: Anterior dislocation of right shoulder. No acute fracture identified. Electronically Signed   By: Danae Orleans M.D.   On: 10/19/2021 16:03        Scheduled Meds:  atorvastatin  80 mg Oral Daily   buPROPion  75 mg Oral BID   losartan  50 mg Oral Daily   memantine  10 mg Oral BID   metoprolol succinate  50 mg Oral Daily   potassium chloride  40 mEq Oral Q4H   sertraline  50 mg Oral Daily   Continuous Infusions:  magnesium sulfate bolus IVPB       LOS: 1 day    Time spent:    Erick Blinks, MD [facility]   If 7PM-7AM, please contact night-coverage [email or web address]  10/20/2021, 1:27 PM

## 2021-10-21 ENCOUNTER — Other Ambulatory Visit: Payer: Self-pay

## 2021-10-21 ENCOUNTER — Inpatient Hospital Stay (HOSPITAL_COMMUNITY): Payer: Medicare Other

## 2021-10-21 ENCOUNTER — Inpatient Hospital Stay (HOSPITAL_COMMUNITY): Payer: Medicare Other | Admitting: Certified Registered"

## 2021-10-21 ENCOUNTER — Encounter (HOSPITAL_COMMUNITY): Payer: Self-pay | Admitting: Internal Medicine

## 2021-10-21 ENCOUNTER — Encounter (HOSPITAL_COMMUNITY)
Admission: EM | Disposition: A | Discharge status: Skilled Nursing Facility | Payer: Self-pay | Source: Home / Self Care | Attending: Internal Medicine

## 2021-10-21 DIAGNOSIS — I10 Essential (primary) hypertension: Secondary | ICD-10-CM | POA: Diagnosis not present

## 2021-10-21 DIAGNOSIS — I509 Heart failure, unspecified: Secondary | ICD-10-CM

## 2021-10-21 DIAGNOSIS — S43004A Unspecified dislocation of right shoulder joint, initial encounter: Secondary | ICD-10-CM | POA: Diagnosis not present

## 2021-10-21 DIAGNOSIS — J449 Chronic obstructive pulmonary disease, unspecified: Secondary | ICD-10-CM | POA: Diagnosis not present

## 2021-10-21 DIAGNOSIS — I4891 Unspecified atrial fibrillation: Secondary | ICD-10-CM

## 2021-10-21 DIAGNOSIS — S92911A Unspecified fracture of right toe(s), initial encounter for closed fracture: Secondary | ICD-10-CM | POA: Diagnosis present

## 2021-10-21 DIAGNOSIS — M24411 Recurrent dislocation, right shoulder: Secondary | ICD-10-CM | POA: Diagnosis not present

## 2021-10-21 DIAGNOSIS — I110 Hypertensive heart disease with heart failure: Secondary | ICD-10-CM | POA: Diagnosis not present

## 2021-10-21 DIAGNOSIS — F0390 Unspecified dementia without behavioral disturbance: Secondary | ICD-10-CM | POA: Diagnosis not present

## 2021-10-21 HISTORY — PX: REVERSE SHOULDER ARTHROPLASTY: SHX5054

## 2021-10-21 LAB — CBC
HCT: 39.3 % (ref 36.0–46.0)
HCT: 39.1 % (ref 36.0–46.0)
Hemoglobin: 12.8 g/dL (ref 12.0–15.0)
Hemoglobin: 13.3 g/dL (ref 12.0–15.0)
MCH: 27.6 pg (ref 26.0–34.0)
MCH: 28.9 pg (ref 26.0–34.0)
MCHC: 32.6 g/dL (ref 30.0–36.0)
MCHC: 34 g/dL (ref 30.0–36.0)
MCV: 84.7 fL (ref 80.0–100.0)
MCV: 85 fL (ref 80.0–100.0)
Platelets: 319 10*3/uL (ref 150–400)
Platelets: 363 10*3/uL (ref 150–400)
RBC: 4.64 MIL/uL (ref 3.87–5.11)
RBC: 4.6 MIL/uL (ref 3.87–5.11)
RDW: 13.9 % (ref 11.5–15.5)
RDW: 13.8 % (ref 11.5–15.5)
WBC: 7.7 10*3/uL (ref 4.0–10.5)
WBC: 17.2 10*3/uL — ABNORMAL HIGH (ref 4.0–10.5)
nRBC: 0 % (ref 0.0–0.2)
nRBC: 0 % (ref 0.0–0.2)

## 2021-10-21 LAB — BASIC METABOLIC PANEL
Anion gap: 8 (ref 5–15)
BUN: 11 mg/dL (ref 8–23)
CO2: 27 mmol/L (ref 22–32)
Calcium: 9.8 mg/dL (ref 8.9–10.3)
Chloride: 103 mmol/L (ref 98–111)
Creatinine, Ser: 0.65 mg/dL (ref 0.44–1.00)
GFR, Estimated: >60 mL/min (ref >60)
Glucose, Bld: 103 mg/dL — ABNORMAL HIGH (ref 70–99)
Potassium: 4.4 mmol/L (ref 3.5–5.1)
Sodium: 138 mmol/L (ref 135–145)

## 2021-10-21 LAB — CREATININE, SERUM
Creatinine, Ser: 0.75 mg/dL (ref 0.44–1.00)
GFR, Estimated: >60 mL/min (ref >60)

## 2021-10-21 SURGERY — ARTHROPLASTY, SHOULDER, TOTAL, REVERSE
Anesthesia: Regional | Site: Shoulder | Laterality: Right

## 2021-10-21 MED ORDER — FENTANYL CITRATE (PF) 250 MCG/5ML IJ SOLN
INTRAMUSCULAR | Status: AC
  Filled 2021-10-21: qty 5

## 2021-10-21 MED ORDER — HYDRALAZINE HCL 20 MG/ML IJ SOLN
10.0000 mg | Freq: Four times a day (QID) | INTRAMUSCULAR | Status: DC | PRN
Start: 2021-10-21 — End: 2021-10-24
  Administered 2021-10-21: 10 mg via INTRAVENOUS
  Filled 2021-10-21: qty 1

## 2021-10-21 MED ORDER — CEFAZOLIN SODIUM-DEXTROSE 2-4 GM/100ML-% IV SOLN
2.0000 g | INTRAVENOUS | Status: AC
Start: 2021-10-21 — End: 2021-10-21
  Administered 2021-10-21: 2 g via INTRAVENOUS

## 2021-10-21 MED ORDER — SODIUM CHLORIDE 0.9 % IR SOLN
Status: DC | PRN
  Administered 2021-10-21: 1000 mL

## 2021-10-21 MED ORDER — SUGAMMADEX SODIUM 200 MG/2ML IV SOLN
INTRAVENOUS | Status: DC | PRN
  Administered 2021-10-21: 200 mg via INTRAVENOUS

## 2021-10-21 MED ORDER — ONDANSETRON HCL 4 MG/2ML IJ SOLN: 4.0000 mg | Freq: Once | INTRAMUSCULAR | Status: DC | PRN

## 2021-10-21 MED ORDER — HYDRALAZINE HCL 20 MG/ML IJ SOLN
5.0000 mg | Freq: Once | INTRAMUSCULAR | Status: AC
  Administered 2021-10-21: 5 mg via INTRAVENOUS

## 2021-10-21 MED ORDER — HYDROCODONE-ACETAMINOPHEN 7.5-325 MG PO TABS
1.0000 | ORAL_TABLET | ORAL | Status: DC | PRN
  Administered 2021-10-23 – 2021-10-24 (×3): 1 via ORAL
  Filled 2021-10-21 (×3): qty 1

## 2021-10-21 MED ORDER — ROCURONIUM BROMIDE 10 MG/ML (PF) SYRINGE
PREFILLED_SYRINGE | INTRAVENOUS | Status: AC
  Filled 2021-10-21: qty 20

## 2021-10-21 MED ORDER — PHENYLEPHRINE 80 MCG/ML (10ML) SYRINGE FOR IV PUSH (FOR BLOOD PRESSURE SUPPORT)
PREFILLED_SYRINGE | INTRAVENOUS | Status: DC | PRN
  Administered 2021-10-21 (×3): 160 ug via INTRAVENOUS
  Administered 2021-10-21: 80 ug via INTRAVENOUS

## 2021-10-21 MED ORDER — VANCOMYCIN HCL 1000 MG IV SOLR
INTRAVENOUS | Status: DC | PRN
  Administered 2021-10-21: 1000 mg via TOPICAL

## 2021-10-21 MED ORDER — BISACODYL 5 MG PO TBEC: 5.0000 mg | DELAYED_RELEASE_TABLET | Freq: Every day | ORAL | Status: DC | PRN

## 2021-10-21 MED ORDER — TRANEXAMIC ACID-NACL 1000-0.7 MG/100ML-% IV SOLN
INTRAVENOUS | Status: AC
  Filled 2021-10-21: qty 100

## 2021-10-21 MED ORDER — ONDANSETRON HCL 4 MG/2ML IJ SOLN
INTRAMUSCULAR | Status: DC | PRN
  Administered 2021-10-21: 4 mg via INTRAVENOUS

## 2021-10-21 MED ORDER — ACETAMINOPHEN 500 MG PO TABS
ORAL_TABLET | ORAL | Status: AC
  Administered 2021-10-21: 1000 mg via ORAL
  Filled 2021-10-21: qty 2

## 2021-10-21 MED ORDER — PROPOFOL 10 MG/ML IV BOLUS
INTRAVENOUS | Status: DC | PRN
  Administered 2021-10-21: 100 mg via INTRAVENOUS

## 2021-10-21 MED ORDER — CHLORHEXIDINE GLUCONATE 0.12 % MT SOLN
OROMUCOSAL | Status: AC
  Administered 2021-10-21: 15 mL via OROMUCOSAL
  Filled 2021-10-21: qty 15

## 2021-10-21 MED ORDER — FENTANYL CITRATE (PF) 100 MCG/2ML IJ SOLN
INTRAMUSCULAR | Status: AC
  Administered 2021-10-21: 100 ug via INTRAVENOUS
  Filled 2021-10-21: qty 2

## 2021-10-21 MED ORDER — ONDANSETRON HCL 4 MG/2ML IJ SOLN
INTRAMUSCULAR | Status: AC
  Filled 2021-10-21: qty 4

## 2021-10-21 MED ORDER — POVIDONE-IODINE 10 % EX SWAB
2.0000 | Freq: Once | CUTANEOUS | Status: AC
  Administered 2021-10-21: 2 via TOPICAL

## 2021-10-21 MED ORDER — GLYCOPYRROLATE PF 0.2 MG/ML IJ SOSY
PREFILLED_SYRINGE | INTRAMUSCULAR | Status: DC | PRN
  Administered 2021-10-21: .2 mg via INTRAVENOUS

## 2021-10-21 MED ORDER — VANCOMYCIN HCL 1000 MG IV SOLR
INTRAVENOUS | Status: AC
Start: 2021-10-21 — End: ?
  Filled 2021-10-21: qty 20

## 2021-10-21 MED ORDER — DEXAMETHASONE SODIUM PHOSPHATE 10 MG/ML IJ SOLN
INTRAMUSCULAR | Status: AC
Start: 2021-10-21 — End: ?
  Filled 2021-10-21: qty 1

## 2021-10-21 MED ORDER — LACTATED RINGERS IV SOLN
INTRAVENOUS | Status: DC
Start: 2021-10-21 — End: 2021-10-21

## 2021-10-21 MED ORDER — LIDOCAINE 2% (20 MG/ML) 5 ML SYRINGE
INTRAMUSCULAR | Status: AC
Start: 2021-10-21 — End: ?
  Filled 2021-10-21: qty 10

## 2021-10-21 MED ORDER — FENTANYL CITRATE (PF) 100 MCG/2ML IJ SOLN
25.0000 ug | INTRAMUSCULAR | Status: DC | PRN
  Administered 2021-10-21: 25 ug via INTRAVENOUS

## 2021-10-21 MED ORDER — BUPIVACAINE HCL (PF) 0.5 % IJ SOLN
INTRAMUSCULAR | Status: DC | PRN
  Administered 2021-10-21: 15 mL via PERINEURAL

## 2021-10-21 MED ORDER — PHENYLEPHRINE HCL-NACL 20-0.9 MG/250ML-% IV SOLN
INTRAVENOUS | Status: DC | PRN
  Administered 2021-10-21: 50 ug/min via INTRAVENOUS

## 2021-10-21 MED ORDER — 0.9 % SODIUM CHLORIDE (POUR BTL) OPTIME
TOPICAL | Status: DC | PRN
  Administered 2021-10-21: 1000 mL

## 2021-10-21 MED ORDER — PHENYLEPHRINE 80 MCG/ML (10ML) SYRINGE FOR IV PUSH (FOR BLOOD PRESSURE SUPPORT)
PREFILLED_SYRINGE | INTRAVENOUS | Status: AC
  Filled 2021-10-21: qty 20

## 2021-10-21 MED ORDER — CHLORHEXIDINE GLUCONATE 0.12 % MT SOLN: 15.0000 mL | Freq: Once | OROMUCOSAL | Status: AC

## 2021-10-21 MED ORDER — BUPIVACAINE LIPOSOME 1.3 % IJ SUSP
INTRAMUSCULAR | Status: DC | PRN
  Administered 2021-10-21: 10 mL via PERINEURAL

## 2021-10-21 MED ORDER — HYDROCODONE-ACETAMINOPHEN 5-325 MG PO TABS
1.0000 | ORAL_TABLET | ORAL | Status: DC | PRN
  Administered 2021-10-22 – 2021-10-24 (×2): 1 via ORAL
  Filled 2021-10-21 (×3): qty 1

## 2021-10-21 MED ORDER — DIPHENHYDRAMINE HCL 12.5 MG/5ML PO ELIX
12.5000 mg | ORAL_SOLUTION | ORAL | Status: DC | PRN
  Administered 2021-10-23: 25 mg via ORAL
  Filled 2021-10-21: qty 10

## 2021-10-21 MED ORDER — CHLORHEXIDINE GLUCONATE 4 % EX LIQD: 60.0000 mL | Freq: Once | CUTANEOUS | Status: DC

## 2021-10-21 MED ORDER — MORPHINE SULFATE (PF) 2 MG/ML IV SOLN: 0.5000 mg | INTRAVENOUS | Status: DC | PRN

## 2021-10-21 MED ORDER — TRANEXAMIC ACID-NACL 1000-0.7 MG/100ML-% IV SOLN
1000.0000 mg | INTRAVENOUS | Status: AC
  Administered 2021-10-21: 1000 mg via INTRAVENOUS

## 2021-10-21 MED ORDER — ACETAMINOPHEN 500 MG PO TABS: 1000.0000 mg | ORAL_TABLET | Freq: Once | ORAL | Status: DC

## 2021-10-21 MED ORDER — ONDANSETRON HCL 4 MG PO TABS: 4.0000 mg | ORAL_TABLET | Freq: Four times a day (QID) | ORAL | Status: DC | PRN

## 2021-10-21 MED ORDER — PROPOFOL 10 MG/ML IV BOLUS
INTRAVENOUS | Status: AC
Start: 2021-10-21 — End: ?
  Filled 2021-10-21: qty 20

## 2021-10-21 MED ORDER — METOCLOPRAMIDE HCL 5 MG PO TABS: 5.0000 mg | ORAL_TABLET | Freq: Three times a day (TID) | ORAL | Status: DC | PRN

## 2021-10-21 MED ORDER — CEFAZOLIN SODIUM-DEXTROSE 1-4 GM/50ML-% IV SOLN
1.0000 g | Freq: Four times a day (QID) | INTRAVENOUS | Status: AC
  Administered 2021-10-21 – 2021-10-22 (×3): 1 g via INTRAVENOUS
  Filled 2021-10-21 (×3): qty 50

## 2021-10-21 MED ORDER — SENNOSIDES-DOCUSATE SODIUM 8.6-50 MG PO TABS: 1.0000 | ORAL_TABLET | Freq: Every evening | ORAL | Status: DC | PRN

## 2021-10-21 MED ORDER — MIDAZOLAM HCL 2 MG/2ML IJ SOLN
INTRAMUSCULAR | Status: AC
  Filled 2021-10-21: qty 2

## 2021-10-21 MED ORDER — ACETAMINOPHEN 500 MG PO TABS
500.0000 mg | ORAL_TABLET | Freq: Four times a day (QID) | ORAL | Status: AC
  Administered 2021-10-21 – 2021-10-22 (×3): 500 mg via ORAL
  Filled 2021-10-21 (×4): qty 1

## 2021-10-21 MED ORDER — METOCLOPRAMIDE HCL 5 MG/ML IJ SOLN: 5.0000 mg | Freq: Three times a day (TID) | INTRAMUSCULAR | Status: DC | PRN

## 2021-10-21 MED ORDER — ONDANSETRON HCL 4 MG/2ML IJ SOLN: 4.0000 mg | Freq: Four times a day (QID) | INTRAMUSCULAR | Status: DC | PRN

## 2021-10-21 MED ORDER — ACETAMINOPHEN 325 MG PO TABS: 325.0000 mg | ORAL_TABLET | Freq: Four times a day (QID) | ORAL | Status: DC | PRN

## 2021-10-21 MED ORDER — ORAL CARE MOUTH RINSE: 15.0000 mL | Freq: Once | OROMUCOSAL | Status: AC

## 2021-10-21 MED ORDER — FENTANYL CITRATE (PF) 100 MCG/2ML IJ SOLN: 100.0000 ug | Freq: Once | INTRAMUSCULAR | Status: AC

## 2021-10-21 MED ORDER — ENOXAPARIN SODIUM 40 MG/0.4ML IJ SOSY
40.0000 mg | PREFILLED_SYRINGE | INTRAMUSCULAR | Status: DC
  Administered 2021-10-22: 40 mg via SUBCUTANEOUS
  Filled 2021-10-21: qty 0.4

## 2021-10-21 MED ORDER — HYDRALAZINE HCL 20 MG/ML IJ SOLN
INTRAMUSCULAR | Status: AC
  Filled 2021-10-21: qty 1

## 2021-10-21 MED ORDER — CEFAZOLIN SODIUM-DEXTROSE 2-4 GM/100ML-% IV SOLN
INTRAVENOUS | Status: AC
  Filled 2021-10-21: qty 100

## 2021-10-21 MED ORDER — ROCURONIUM BROMIDE 10 MG/ML (PF) SYRINGE
PREFILLED_SYRINGE | INTRAVENOUS | Status: DC | PRN
  Administered 2021-10-21: 40 mg via INTRAVENOUS
  Administered 2021-10-21: 20 mg via INTRAVENOUS

## 2021-10-21 MED ORDER — ACETAMINOPHEN 500 MG PO TABS
1000.0000 mg | ORAL_TABLET | Freq: Once | ORAL | Status: AC
Start: 2021-10-21 — End: 2021-10-21

## 2021-10-21 MED ORDER — FENTANYL CITRATE (PF) 100 MCG/2ML IJ SOLN
INTRAMUSCULAR | Status: AC
  Filled 2021-10-21: qty 2

## 2021-10-21 MED ORDER — FENTANYL CITRATE (PF) 250 MCG/5ML IJ SOLN
INTRAMUSCULAR | Status: DC | PRN
Start: 2021-10-21 — End: 2021-10-21
  Administered 2021-10-21: 100 ug via INTRAVENOUS

## 2021-10-21 MED ORDER — DOCUSATE SODIUM 100 MG PO CAPS
100.0000 mg | ORAL_CAPSULE | Freq: Two times a day (BID) | ORAL | Status: DC
  Administered 2021-10-21 – 2021-10-23 (×3): 100 mg via ORAL
  Filled 2021-10-21 (×6): qty 1

## 2021-10-21 MED ORDER — LIDOCAINE 2% (20 MG/ML) 5 ML SYRINGE
INTRAMUSCULAR | Status: DC | PRN
  Administered 2021-10-21: 40 mg via INTRAVENOUS

## 2021-10-21 MED ORDER — DEXAMETHASONE SODIUM PHOSPHATE 10 MG/ML IJ SOLN
INTRAMUSCULAR | Status: DC | PRN
  Administered 2021-10-21: 5 mg via INTRAVENOUS

## 2021-10-21 SURGICAL SUPPLY — 71 items
BAG COUNTER SPONGE SURGICOUNT (BAG) ×1 IMPLANT
BASEPLATE GLENOSPHERE 25 STD (Miscellaneous) ×1 IMPLANT
BIT DRILL 3.2 PERIPHERAL SCREW (BIT) ×1 IMPLANT
BLADE SAW SGTL 73X25 THK (BLADE) ×2 IMPLANT
CHLORAPREP W/TINT 26 (MISCELLANEOUS) ×4 IMPLANT
COVER SURGICAL LIGHT HANDLE (MISCELLANEOUS) ×2 IMPLANT
DRAPE C-ARM 42X72 X-RAY (DRAPES) IMPLANT
DRAPE HALF SHEET 40X57 (DRAPES) ×2 IMPLANT
DRAPE INCISE IOBAN 66X45 STRL (DRAPES) ×2 IMPLANT
DRAPE ORTHO SPLIT 77X108 STRL (DRAPES) ×2
DRAPE SURG ORHT 6 SPLT 77X108 (DRAPES) ×2 IMPLANT
DRAPE SWITCH (DRAPES) ×1 IMPLANT
DRAPE U-SHAPE 47X51 STRL (DRAPES) IMPLANT
DRSG AQUACEL AG ADV 3.5X 6 (GAUZE/BANDAGES/DRESSINGS) ×1 IMPLANT
DRSG AQUACEL AG ADV 3.5X10 (GAUZE/BANDAGES/DRESSINGS) ×1 IMPLANT
DRSG XEROFORM 1X8 (GAUZE/BANDAGES/DRESSINGS) ×1 IMPLANT
ELECT BLADE 4.0 EZ CLEAN MEGAD (MISCELLANEOUS) ×2
ELECT REM PT RETURN 9FT ADLT (ELECTROSURGICAL) ×2
ELECTRODE BLDE 4.0 EZ CLN MEGD (MISCELLANEOUS) IMPLANT
ELECTRODE REM PT RTRN 9FT ADLT (ELECTROSURGICAL) ×1 IMPLANT
GLENOSPHERE STANDARD 39 (Joint) ×2 IMPLANT
GLENOSPHERE STD 39 (Joint) IMPLANT
GLOVE BIO SURGEON STRL SZ 6.5 (GLOVE) ×2 IMPLANT
GLOVE BIOGEL PI IND STRL 8 (GLOVE) ×1 IMPLANT
GLOVE BIOGEL PI INDICATOR 8 (GLOVE) ×1
GLOVE ECLIPSE 8.0 STRL XLNG CF (GLOVE) ×4 IMPLANT
GLOVE INDICATOR 6.5 STRL GRN (GLOVE) ×2 IMPLANT
GOWN STRL REUS W/ TWL LRG LVL3 (GOWN DISPOSABLE) ×1 IMPLANT
GOWN STRL REUS W/ TWL XL LVL3 (GOWN DISPOSABLE) IMPLANT
GOWN STRL REUS W/TWL LRG LVL3 (GOWN DISPOSABLE) ×1
GOWN STRL REUS W/TWL XL LVL3 (GOWN DISPOSABLE)
GUIDEWIRE GLENOID 2.5X220 (WIRE) ×1 IMPLANT
HANDPIECE INTERPULSE COAX TIP (DISPOSABLE) ×1
INSERT FLEX SHLD REV 39 +9 (Insert) ×1 IMPLANT
KIT BASIN OR (CUSTOM PROCEDURE TRAY) ×2 IMPLANT
KIT STABILIZATION SHOULDER (MISCELLANEOUS) ×2 IMPLANT
KIT TURNOVER KIT B (KITS) ×2 IMPLANT
MANIFOLD NEPTUNE II (INSTRUMENTS) ×2 IMPLANT
NDL HYPO 25GX1X1/2 BEV (NEEDLE) IMPLANT
NDL MAYO TROCAR (NEEDLE) IMPLANT
NEEDLE HYPO 25GX1X1/2 BEV (NEEDLE) IMPLANT
NEEDLE MAYO TROCAR (NEEDLE) IMPLANT
NS IRRIG 1000ML POUR BTL (IV SOLUTION) ×2 IMPLANT
PACK SHOULDER (CUSTOM PROCEDURE TRAY) ×2 IMPLANT
PAD ARMBOARD 7.5X6 YLW CONV (MISCELLANEOUS) ×4 IMPLANT
RESTRAINT HEAD UNIVERSAL NS (MISCELLANEOUS) ×2 IMPLANT
SCREW 5.0X18 (Screw) ×1 IMPLANT
SCREW 5.0X38 SMALL F/PERFORM (Screw) ×1 IMPLANT
SCREW BONE 6.5X40 SM (Screw) ×1 IMPLANT
SCREWDRIVER QUICK CON POLY XPD (MISCELLANEOUS) ×1 IMPLANT
SET HNDPC FAN SPRY TIP SCT (DISPOSABLE) ×1 IMPLANT
SLING ARM IMMOBILIZER LRG (SOFTGOODS) ×1 IMPLANT
SPONGE T-LAP 18X18 ~~LOC~~+RFID (SPONGE) ×1 IMPLANT
STEM HUMERAL SZ4BX104 132.5D (Joint) ×1 IMPLANT
STRIP CLOSURE SKIN 1/2X4 (GAUZE/BANDAGES/DRESSINGS) ×1 IMPLANT
SUCTION FRAZIER HANDLE 10FR (MISCELLANEOUS)
SUCTION TUBE FRAZIER 10FR DISP (MISCELLANEOUS) IMPLANT
SUT ETHIBOND 2 V 37 (SUTURE) ×2 IMPLANT
SUT ETHIBOND NAB CT1 #1 30IN (SUTURE) ×2 IMPLANT
SUT ETHILON 2 0 FS 18 (SUTURE) ×1 IMPLANT
SUT FIBERWIRE #5 38 CONV NDL (SUTURE) ×8
SUT MNCRL AB 3-0 PS2 18 (SUTURE) ×2 IMPLANT
SUT VIC AB 2-0 CT1 27 (SUTURE) ×1
SUT VIC AB 2-0 CT1 TAPERPNT 27 (SUTURE) ×1 IMPLANT
SUT VIC AB 3-0 SH 27 (SUTURE) ×1
SUT VIC AB 3-0 SH 27X BRD (SUTURE) IMPLANT
SUTURE FIBERWR #5 38 CONV NDL (SUTURE) ×4 IMPLANT
TOWEL GREEN STERILE (TOWEL DISPOSABLE) ×2 IMPLANT
TRAY FOLEY W/BAG SLVR 14FR (SET/KITS/TRAYS/PACK) IMPLANT
TRAY REV FLEX AEQUALIS 3.5X6 (Shoulder) ×1 IMPLANT
WATER STERILE IRR 1000ML POUR (IV SOLUTION) ×1 IMPLANT

## 2021-10-21 NOTE — Op Note (Signed)
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  Patient had clearly had a dislocation for minimum 3 to 4 weeks and likely more on the order of 6 to 8 weeks.  She had formed a pseudocapsule around the joint which would not happen in a 3-day span.  Additionally the anterior 30% of the glenoid was essentially missing and attritional he lost.  This had been corticated and started to heal over consistent with a chronic dislocation rather than something that happened 2 to 3 days ago as it was initially reported.  We were able to obtain a reasonably stable overall construct however in external rotation and adduction maximally the implant impinged on the scapular neck in a way that we did not feel that a eccentric sphere would improve.  That said I still was not able to dislocate the patient from this position.  Patient is an extremely high risk 20 to 30% of continued instability.  Fracture is also possible.  Her axillary nerve tug test was unable to be performed secondary to subcoracoid scarring and her chronic dislocation.  If the patient fails the surgery likely would perform a resection arthroplasty type surgery.  We may end up placing a hemiarthroplasty implant at best.  Patient should stay in the sling for 4 weeks however due to her mentation this may be difficult.  Post-operative plan: The patient will be NWB in sling.  The patient will be will be admitted due to medical issues and pain control overnight.  DVT prophylaxis not indicated as patient already on full dose anticoagulant.  Anticoagulation can restart tomorrow.  Pain control  with PRN pain medication preferring oral medicines.  Follow up plan will be scheduled in approximately 7 days for incision check and XR.  Physical therapy to start after 4 weeks.  Implants: Size 4 long flex stem, +6 tray high offset with +9 polyethylene, retentive.  39 glenosphere with a 25 standard baseplate and 40 center screw with 2 peripheral locking screws.  Post-Op Diagnosis: Same Surgeons:Primary: Bjorn Pippin, MD Assistants:Caroline McBane PA-C Location: [facility] OR ROOM 04 Anesthesia: General with Exparel Interscalene Antibiotics: Ancef 2g preop, Vancomycin 1000mg  locally Tourniquet time: None Estimated Blood Loss: 100 Complications: None Specimens: None Implants: Implant Name Type Inv. Item Serial No. Manufacturer Lot No. LRB No. Used Action  BASEPLATE GLENOSPHERE 25 STD - [id number] Miscellaneous BASEPLATE GLENOSPHERE 25 STD [id number] [employer]  Right 1 Implanted  GLENOSPHERE STANDARD 39 - [id number] Joint GLENOSPHERE STANDARD 39 [id number] [employer]  Right 1 Implanted  SCREW BONE 6.5X40 SM - [id number] Screw SCREW BONE 6.5X40 SM  [employer]  Right 1 Implanted  SCREW 5.0X38 SMALL F/PERFORM - [id number] Screw SCREW 5.0X38 SMALL F/PERFORM  [employer]  Right 1 Implanted  SCREW 5.0X18 - [id number] Screw SCREW 5.0X18  [employer]  Right 1 Implanted  STEM HUMERAL [id number] 132.5D - [id number] Joint STEM HUMERAL [id number] 132.5D [id number] [employer]  Right 1 Implanted  TRAY REV FLEX AEQUALIS 3.5X6 - [id number] Shoulder TRAY REV FLEX AEQUALIS 3.5X6 [id number] [employer]  Right 1 Implanted  INSERT FLEX SHLD REV 39 +9 - [id number] Insert INSERT FLEX SHLD REV 39 +9 [id number] [employer]  Right 1 Implanted  Indications for Surgery:   Saryah Ames is a 76 y.o. female with subacute shoulder dislocation.  Patient has dementia but was still able to function independently for the most part.  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  She had continued pain and thus based on all these factors felt that an attempted surgery was reasonable.  Benefits and risks of operative and nonoperative management were discussed prior to surgery with patient/guardian(s) and informed consent form was completed.  Infection and need for further surgery were discussed as was prosthetic stability and cuff issues.  We additionally specifically discussed risks of axillary nerve injury, infection, periprosthetic fracture, continued pain and longevity of implants prior to beginning procedure.      Procedure:   The patient was identified in the preoperative holding area where the surgical site was marked. Block placed by anesthesia with exparel.  The patient was taken to the OR where a procedural timeout was called and the above noted anesthesia was induced.  The patient was positioned beachchair on allen table with spider arm positioner.  Preoperative antibiotics were dosed.  The patient's right shoulder was prepped and draped in the usual sterile fashion.  A second preoperative timeout was called.       We began by using a standard deltopectoral approach.  Went through skin sharp achieving hemostasis to progress.  We found the deltopectoral interval and opened it with blunt retractors.  We then identified the clavipectoral fascia was opened and the seroma was removed.  We noted that the humeral head was clearly dislocated and the subcoracoid space.  We carefully performed an open reduction of the humeral head onto the glenoid.  At that point we were able to release the anterior capsule.  The subscapularis was essentially completely ruptured and atrophied.  We obtain purchase for family atrophy tissue as well as the inferior capsule for eventual closure.  We performed a capsulotomy and expose the anterior inferior neck.  At that point an osteotomy using a cut guide.  We prepped the humerus using the flex system and obtain  reasonable purchase with a long 4 stem.  We placed a cut protector and then placed our blunt retractors for exposure to the glenoid.  Due to the subcoracoid scarring and pseudocapsule that are formed axillary nerve tug test was clearly unable to be performed.  We thus stayed away from this region.  We carefully placed blunt retractors around the glenoid.  The anterior 30% or so of the glenoid had been lost and had read corticated which was consistent with this being a subacute versus chronic type dislocation.  We felt that by strategically placing her baseplate that we could still perform a reverse total shoulder arthroplasty and treat the glenoid fracture open.  We placed a guidewire slightly posterior and in the neutral superior inferior location trying to maintain a circumferential vault at least as far lateral on the glenoid as possible.  We then carefully changed our version to try and control version to minimize her risk of recurrent instability.  We prepped the glenoid surface and had about 75% baseplate seating.  Our vault was slightly perforated posteriorly.  That said we obtained robust purchase with our center screw.  A 6.5 x 40 screw was selected with a 25 standard baseplate.  We were able to obtain great fixation with this construct.  A superior and inferior locking screw were placed.  Peripheral  reaming was performed and we removed some of the native anterior glenoid which had fragmented in the anterior space.  We then were able to place a 39 glenosphere without issue.  We removed a cut protector and trialed multiple sizes landing on a +6 high offset tray, +9 retentive polyethylene.  We had some mild inferior interference between the high wall of the retentive polyethylene and the inferior scapular neck however we felt that this was still appropriate.  The joint was not able to be dislocated at this point.  We did place the patient in slightly tighter than typical.  We then irrigated copiously  and placed 4 sutures before final implants for eventual closure of the capsule and the remnant of the subscapularis.  We used four #5 FiberWire to perform a repair of the inferior capsular and subscapularis structures.  We then irrigated again and closed the incision in a multilayer fashion with absorbable suture deep and nylon superficial.  Sterile dressing was placed.  Patient was awoken after placing in a sling and taken to PACU in stable condition.  Alfonse Alpers, PA-C, present and scrubbed throughout the case, critical for completion in a timely fashion, and for retraction, instrumentation, closure.

## 2021-10-21 NOTE — Anesthesia Preprocedure Evaluation (Addendum)
Anesthesia Evaluation  Patient identified by MRN, date of birth, ID band Patient awake    Reviewed: Allergy & Precautions, Patient's Chart, lab work & pertinent test results  Airway Mallampati: II  TM Distance: >3 FB Neck ROM: Full    Dental no notable dental hx.    Pulmonary COPD, Current Smoker,    Pulmonary exam normal breath sounds clear to auscultation       Cardiovascular hypertension, Pt. on medications +CHF (grade 1 diastolic dysfunction)  Normal cardiovascular exam+ dysrhythmias (eliquis) Atrial Fibrillation + Valvular Problems/Murmurs (mild MR) MR  Rhythm:Regular Rate:Normal  Echo 2022 1. Left ventricular ejection fraction, by estimation, is 55 to 60%. The left ventricle has normal function. The left ventricle has no regional wall motion abnormalities. Left ventricular diastolic parameters are consistent with Grade I diastolic dysfunction (impaired relaxation).  2. Right ventricular systolic function is normal. The right ventricular size is normal.  3. The mitral valve is normal in structure. Mild mitral valve  regurgitation. No evidence of mitral stenosis.  4. The aortic valve is tricuspid. There is mild calcification of the aortic valve. There is mild thickening of the aortic valve. Aortic valve regurgitation is not visualized. No aortic stenosis is present.  5. The inferior vena cava is normal in size with greater than 50% respiratory variability, suggesting right atrial pressure of 3 mmHg.    Neuro/Psych PSYCHIATRIC DISORDERS Anxiety Depression Dementia CVA, No Residual Symptoms    GI/Hepatic negative GI ROS, Neg liver ROS,   Endo/Other  negative endocrine ROS  Renal/GU negative Renal ROS  negative genitourinary   Musculoskeletal R shoulder chronic dislocation    Abdominal   Peds  Hematology negative hematology ROS (+) Hb 12.8   Anesthesia Other Findings   Reproductive/Obstetrics negative OB ROS                             Anesthesia Physical Anesthesia Plan  ASA: 3  Anesthesia Plan: General and Regional   Post-op Pain Management: Regional block* and Tylenol PO (pre-op)*   Induction: Intravenous  PONV Risk Score and Plan: 2 and Ondansetron, Dexamethasone, Midazolam and Treatment may vary due to age or medical condition  Airway Management Planned: Oral ETT  Additional Equipment: None  Intra-op Plan:   Post-operative Plan: Extubation in OR  Informed Consent: I have reviewed the patients History and Physical, chart, labs and discussed the procedure including the risks, benefits and alternatives for the proposed anesthesia with the patient or authorized representative who has indicated his/her understanding and acceptance.     Dental advisory given  Plan Discussed with: CRNA  Anesthesia Plan Comments: (Lab Results      Component                Value               Date                      WBC                      7.7                 10/21/2021                HGB                      12.8  10/21/2021                HCT                      39.3                10/21/2021                MCV                      84.7                10/21/2021                PLT                      319                 10/21/2021           Lab Results      Component                Value               Date                      NA                       138                 10/21/2021                K                        4.4                 10/21/2021                CO2                      27                  10/21/2021                GLUCOSE                  103 (H)             10/21/2021                BUN                      11                  10/21/2021                CREATININE               0.65                10/21/2021                CALCIUM                  9.8                 10/21/2021  EGFR                     88                   05/22/2021                GFRNONAA                 >60                 10/21/2021          )       Anesthesia Quick Evaluation

## 2021-10-22 DIAGNOSIS — J449 Chronic obstructive pulmonary disease, unspecified: Secondary | ICD-10-CM | POA: Diagnosis not present

## 2021-10-22 DIAGNOSIS — S43004A Unspecified dislocation of right shoulder joint, initial encounter: Secondary | ICD-10-CM | POA: Diagnosis not present

## 2021-10-22 DIAGNOSIS — F0390 Unspecified dementia without behavioral disturbance: Secondary | ICD-10-CM | POA: Diagnosis not present

## 2021-10-22 DIAGNOSIS — I10 Essential (primary) hypertension: Secondary | ICD-10-CM | POA: Diagnosis not present

## 2021-10-22 MED ORDER — APIXABAN 5 MG PO TABS
5.0000 mg | ORAL_TABLET | Freq: Two times a day (BID) | ORAL | Status: DC
  Administered 2021-10-22 – 2021-10-24 (×4): 5 mg via ORAL
  Filled 2021-10-22 (×4): qty 1

## 2021-10-22 MED ORDER — ACETAMINOPHEN 325 MG PO TABS
650.0000 mg | ORAL_TABLET | Freq: Four times a day (QID) | ORAL | Status: DC | PRN
  Administered 2021-10-23: 650 mg via ORAL
  Filled 2021-10-22: qty 2

## 2021-10-22 NOTE — Social Work (Signed)
Please be advised that the above-named patient will require a short-term nursing home stay-anticipated 30 days or less for rehabilitation and strengthening. The plan is for return home.  

## 2021-10-22 NOTE — Evaluation (Addendum)
Occupational Therapy Evaluation Patient Details Name: Marisa Nunez MRN: [id number] DOB: March 14, 1946 Today's Date: 10/22/2021   History of Present Illness Pt is a 76 y/o female who presented for reverse TSA of R shoulder on 6/26 due to continued pain/deformity of R shoulder dislocation after fall/ED visit on 6/22. Pt also with R great toe fx noted on 6/22.  Pt with chronic issues of R shoulder including arthocentesis and aspiration of subdeltoid effusion in setting of osteoarthritis at PCP on 6/6. PMH: anxiety, cancer, COPD, dementia, CVA   Clinical Impression   PTA, pt lives with family, typically ambulatory without a device and able to complete ADLs without assist. Pt presents now with deficits in R shoulder pain, cognition, dynamic standing balance and overall strength. Pt received on BSC, able to complete transfers/short mobility with Min A with handheld assist to maintain balance. Due to impaired use of R dominant UE and strict no shoulder ROM, pt requires up to Total A for UB ADLs and Max A for LB ADLs. Pt requires frequent cues to avoid shoulder movement during session. Based on current presentation, would recommend SNF rehab at DC as pt at high risk for falls. However, if family able to provide 24/7 assist at DC for ADLs/mobility, may be able to DC home. Will continue to follow acutely to progress ADLs and implementation of shoulder precautions.     Recommendations for follow up therapy are one component of a multi-disciplinary discharge planning process, led by the attending physician.  Recommendations may be updated based on patient status, additional functional criteria and insurance authorization.   Follow Up Recommendations  Skilled nursing-short term rehab (<3 hours/day) (unless family able to provide extensive ADL assist at DC)    Assistance Recommended at Discharge Frequent or constant Supervision/Assistance  Patient can return home with the following A little help with walking  and/or transfers;A lot of help with bathing/dressing/bathroom    Functional Status Assessment  Patient has had a recent decline in their functional status and demonstrates the ability to make significant improvements in function in a reasonable and predictable amount of time.  Equipment Recommendations  BSC/3in1    Recommendations for Other Services       Precautions / Restrictions Precautions Precautions: Fall;Other (comment) Precaution Comments: donjoy sling w/ abduction wedge Required Braces or Orthoses: Sling Restrictions Weight Bearing Restrictions: Yes RUE Weight Bearing: Non weight bearing Other Position/Activity Restrictions: ok for hand/wrist/elbow AROM. NO shoulder ROM per order set      Mobility Bed Mobility               General bed mobility comments: received on BSC    Transfers Overall transfer level: Needs assistance Equipment used: 1 person hand held assist Transfers: Sit to/from Stand, Bed to chair/wheelchair/BSC Sit to Stand: Min guard     Step pivot transfers: Min assist     General transfer comment: handheld assist to maintain balance with dynamic tasks w/ pt unsteadiness stepping from [facility] towards recliner. benefitting from sequencing cues      Balance Overall balance assessment: Needs assistance Sitting-balance support: No upper extremity supported, Feet supported Sitting balance-Leahy Scale: Fair     Standing balance support: Single extremity supported, During functional activity Standing balance-Leahy Scale: Poor                             ADL either performed or assessed with clinical judgement   ADL Overall ADL's : Needs assistance/impaired Eating/Feeding: Minimal assistance;Sitting  Eating/Feeding Details (indicate cue type and reason): educated to hold small items w R hand to assist in opening with L though pt difficulty. pt reports difficulty feeding self with L UE but working on it Grooming: Minimal  assistance;Sitting   Upper Body Bathing: Total assistance;Sitting Upper Body Bathing Details (indicate cue type and reason): cues to avoid moving R shoulder, continued clarification needed on option to use gravity and dangle method supported on lap for under arm Lower Body Bathing: Sit to/from stand;Maximal assistance Lower Body Bathing Details (indicate cue type and reason): pt relying on L UE support in standing and reported unable to maintain balance and perform peri care Upper Body Dressing : Total assistance;Sitting Upper Body Dressing Details (indicate cue type and reason): cues to avoid shoudler movement. extensive assist to manage sling, gown without moving shoulder Lower Body Dressing: Maximal assistance;Sit to/from stand Lower Body Dressing Details (indicate cue type and reason): to doff/don mesh underwear Toilet Transfer: Minimal assistance;Stand-pivot;BSC/3in1   Toileting- Clothing Manipulation and Hygiene: Maximal assistance;Sitting/lateral lean       Functional mobility during ADLs: Minimal assistance General ADL Comments: Limited by impaired use of R dominant UE, pain and rec to avoid any shoulder ROM requiring extensive assist for ADLs     Vision Baseline Vision/History: 1 Wears glasses Ability to See in Adequate Light: 0 Adequate Patient Visual Report: No change from baseline Vision Assessment?: No apparent visual deficits     Perception     Praxis      Pertinent Vitals/Pain Pain Assessment Pain Assessment: 0-10 Pain Score: 8  Pain Location: R shoulder Pain Descriptors / Indicators: Grimacing, Guarding, Sore, Operative site guarding Pain Intervention(s): Monitored during session, Premedicated before session, Patient requesting pain meds-RN notified, Repositioned     Hand Dominance Right   Extremity/Trunk Assessment Upper Extremity Assessment Upper Extremity Assessment: RUE deficits/detail RUE Deficits / Details: in donjoy sling w/ abduction wedge. assisted  in repositioning for elbow to be closer to 90* in sling. able to move hand, wrist and elbow (some assist with elbow) though increased pain in shoulder with elbow ROM.   Lower Extremity Assessment Lower Extremity Assessment: Defer to PT evaluation   Cervical / Trunk Assessment Cervical / Trunk Assessment: Normal   Communication Communication Communication: No difficulties   Cognition Arousal/Alertness: Awake/alert Behavior During Therapy: WFL for tasks assessed/performed, Flat affect Overall Cognitive Status: Impaired/Different from baseline Area of Impairment: Memory, Following commands, Safety/judgement, Problem solving, Awareness                     Memory: Decreased short-term memory Following Commands: Follows one step commands with increased time Safety/Judgement: Decreased awareness of deficits, Decreased awareness of safety Awareness: Emergent Problem Solving: Difficulty sequencing, Requires verbal cues, Slow processing General Comments: Pt with STM deficits, difficulty recalling home setup, how fall occurred, etc. increased time to initiate tasks, cues to avoid shoulder ROM throughout     General Comments       Exercises Exercises: Other exercises Other Exercises Other Exercises: AROM of hand, wrist and elbow   Shoulder Instructions      Home Living Family/patient expects to be discharged to:: Private residence Living Arrangements: Children;Other relatives Available Help at Discharge: Family;Available 24 hours/day Type of Home: House Home Access: Stairs to enter [employer] of Steps: "just a few" - pt unsure of exact number   Home Layout: One level     Bathroom Shower/Tub: Chief Strategy Officer: Standard     Home Equipment: Shower  seat;Hand held shower head   Additional Comments: pt reports her daughter does not work and is at home. will need to confirm      Prior Functioning/Environment Prior Level of Function : Needs  assist;Patient poor historian/Family not available             Mobility Comments: no use of AD per pt, unsure of number of falls and pt unable to identify how shoulder was injured ADLs Comments: reports able to complete ADLs without assist        OT Problem List: Decreased strength;Decreased activity tolerance;Impaired balance (sitting and/or standing);Decreased cognition;Decreased safety awareness;Decreased knowledge of use of DME or AE;Decreased knowledge of precautions;Pain;Impaired UE functional use      OT Treatment/Interventions: Self-care/ADL training;Therapeutic exercise;Energy conservation;DME and/or AE instruction;Therapeutic activities;Patient/family education;Balance training    OT Goals(Current goals can be found in the care plan section) Acute Rehab OT Goals Patient Stated Goal: pain control, go home OT Goal Formulation: With patient Time For Goal Achievement: 11/05/21 Potential to Achieve Goals: Good  OT Frequency: Min 2X/week    Co-evaluation              AM-PAC OT "6 Clicks" Daily Activity     Outcome Measure Help from another person eating meals?: A Little Help from another person taking care of personal grooming?: A Little Help from another person toileting, which includes using toliet, bedpan, or urinal?: A Lot Help from another person bathing (including washing, rinsing, drying)?: A Lot Help from another person to put on and taking off regular upper body clothing?: Total Help from another person to put on and taking off regular lower body clothing?: A Lot 6 Click Score: 13   End of Session Equipment Utilized During Treatment: Other (comment) (sling) Nurse Communication: Mobility status;Patient requests pain meds  Activity Tolerance: Patient tolerated treatment well;Patient limited by pain Patient left: in chair;with call bell/phone within reach;with chair alarm set  OT Visit Diagnosis: Unsteadiness on feet (R26.81);Other abnormalities of gait and  mobility (R26.89);Pain Pain - Right/Left: Right Pain - part of body: Shoulder                Time: 1027-2536 OT Time Calculation (min): 29 min Charges:  OT General Charges $OT Visit: 1 Visit OT Evaluation $OT Eval Moderate Complexity: 1 Mod OT Treatments $Self Care/Home Management : 8-22 mins  Bradd Canary, OTR/L Acute Rehab Services Office: [phone]   Delories Ziemer 10/22/2021, 9:32 AM

## 2021-10-22 NOTE — Progress Notes (Signed)
PROGRESS NOTE    Marisa Nunez  WUJ:[id number] DOB: [birth year] DOA: 10/19/2021 PCP: Dellis Filbert, MD    Brief Narrative:  76 year old female with a history of dementia, paroxysmal atrial fibrillation on Eliquis, hypertension, anxiety, COPD, dementia with behavioral disturbance, depression was admitted to the hospital with right shoulder deformity and pain.  Patient was noted to have recurrent right shoulder dislocation.  Patient was then seen by orthopedics and was admitted hospital for surgery intervention.  Patient was on Eliquis which was on hold for surgery.  Following conditions were addressed during hospitalization,  Recurrent right shoulder dislocation Orthopedics was consulted and underwent right reverse total shoulder arthroplasty, right open treatment of shoulder dislocation on 10/21/2021.  Currently nonweightbearing in a sling.  PT and OT has been consulted and will need physical therapy to be started in 4 weeks.  We will focus on pain control.  Anticoagulation has been initiated.  At this time, OT, PT has recommended skilled nursing facility placement.   Nondisplaced fracture of right foot 1st proximal phalanx Immobilization.  Conservative treatment   Paroxysmal atrial fibrillation Continue metoprolol.  Will resume Eliquis for anticoagulation.   Hypokalemia Replenished and improved.  Latest potassium of 4.4.  Check BMP in AM.   COPD Compensated at this time.  Continue bronchodilators.   History of stroke Continue Lipitor, Eliquis.   Dementia Continue Namenda.  Nonsurgical during nighttime as needed.   Anxiety, depression Continue Zoloft.   Hypertension -Continue losartan and metoprolol, blood pressure seems to be stable at this time.  Deconditioning, debility.  Patient has been seen by physical therapy and occupational therapy and recommended skilled nursing facility placement at this time      DVT prophylaxis: SCDs Start: 10/21/21 1849 SCDs Start: 10/19/21  2346 apixaban (ELIQUIS) tablet 5 mg   Code Status:     Code Status: DNR  Disposition: Skilled nursing facility  Status is: Inpatient  Remains inpatient appropriate because: Status post Surgery, need for rehabilitation   Family Communication: Communicated with the patient at bedside  Consultants:  Orthopedics.  Procedures:  Right reverse total shoulder arthroplasty on 10/21/2021  Antimicrobials:  None  Anti-infectives (From admission, onward)    Start     Dose/Rate Route Frequency Ordered Stop   10/21/21 2100  ceFAZolin (ANCEF) IVPB 1 g/50 mL premix        1 g 100 mL/hr over 30 Minutes Intravenous Every 6 hours 10/21/21 1848 10/22/21 0857   10/21/21 1610  vancomycin (VANCOCIN) powder  Status:  Discontinued          As needed 10/21/21 1611 10/21/21 1646   10/21/21 1330  ceFAZolin (ANCEF) IVPB 2g/100 mL premix        2 g 200 mL/hr over 30 Minutes Intravenous On call to O.R. 10/21/21 1138 10/21/21 1512   10/21/21 1139  ceFAZolin (ANCEF) 2-4 GM/100ML-% IVPB       Note to Pharmacy: Shanda Bumps M: cabinet override      10/21/21 1139 10/21/21 1514      Subjective: Today, patient was seen and examined at bedside.  Complains of mild shoulder pain.  Wants to go home.  Denies any nausea vomiting fever chills.  Has had a bowel movement.  Objective: Vitals:   10/22/21 0445 10/22/21 0840 10/22/21 0911 10/22/21 1329  BP: (!) 160/70 132/78  (!) 112/93  Pulse: 72 (!) 56  (!) 106  Resp: 17 18  19   Temp: 97.7 F (36.5 C) 98.8 F (37.1 C)  99.7 F (37.6 C)  TempSrc:  Oral  Oral  SpO2: 97% 94% 96% 95%  Weight:      Height:        Intake/Output Summary (Last 24 hours) at 10/22/2021 1441 Last data filed at 10/21/2021 2000 Gross per 24 hour  Intake 1280 ml  Output 100 ml  Net 1180 ml   Filed Weights   10/20/21 0058 10/21/21 1132  Weight: 57.1 kg 54.4 kg    Physical Examination: Body mass index is 20.6 kg/m.   General:  Average built, not in obvious distress HENT:    No scleral pallor or icterus noted. Oral mucosa is moist.  Chest:    Diminished breath sounds bilaterally. No crackles or wheezes.  CVS: S1 &S2 heard. No murmur.  Regular rate and rhythm. Abdomen: Soft, nontender, nondistended.  Bowel sounds are heard.   Extremities: No cyanosis, clubbing or edema.  Peripheral pulses are palpable.  Right upper extremity in a sling. Psych: Alert, awake and oriented, normal mood CNS:  No cranial nerve deficits.  Power equal in all extremities.  Right upper extremity in a sling. Skin: Warm and dry.  No rashes noted.  Data Reviewed:   CBC: Recent Labs  Lab 10/15/21 1747 10/17/21 0128 10/19/21 2210 10/21/21 0725 10/21/21 1950  WBC 15.2* 11.6* 10.4 7.7 17.2*  NEUTROABS  --  9.4*  --   --   --   HGB 13.5 13.1 12.5 12.8 13.3  HCT 40.8 39.7 38.7 39.3 39.1  MCV 85.4 85.9 87.0 84.7 85.0  PLT 325 308 311 319 363    Basic Metabolic Panel: Recent Labs  Lab 10/15/21 1747 10/17/21 0128 10/19/21 2210 10/20/21 0311 10/21/21 0725 10/21/21 1950  NA 134* 133* 139  --  138  --   K 4.1 3.3* 3.1* 3.3* 4.4  --   CL 99 100 108  --  103  --   CO2 24 23 24   --  27  --   GLUCOSE 94 127* 96  --  103*  --   BUN 9 11 14   --  11  --   CREATININE 0.73 0.89 0.68  --  0.65 0.75  CALCIUM 9.6 9.2 9.4  --  9.8  --   MG  --   --   --  1.7  --   --     Liver Function Tests: Recent Labs  Lab 10/15/21 1747 10/17/21 0128  AST 21 18  ALT 15 13  ALKPHOS 112 110  BILITOT 1.1 0.7  PROT 6.5 6.1*  ALBUMIN 3.9 3.7     Radiology Studies: DG Shoulder Right Port  Result Date: 10/21/2021 CLINICAL DATA:  Status post right shoulder replacement surgery. EXAM: RIGHT SHOULDER - 1 VIEW COMPARISON:  None Available. FINDINGS: A right shoulder replacement is seen without evidence of surrounding lucency. There is no evidence of an acute fracture or dislocation. Degenerative changes are seen involving the right acromioclavicular joint. Mild diffuse soft tissue swelling is seen with  soft tissue air noted below the right acromion. IMPRESSION: Status post right shoulder replacement without evidence of hardware complication. Electronically Signed   By: Aram Candela M.D.   On: 10/21/2021 18:52      LOS: 3 days    Joycelyn Das, MD [facility] Available via Epic secure chat 7am-7pm After these hours, please refer to coverage provider listed on [email or web address] 10/22/2021, 2:41 PM

## 2021-10-22 NOTE — Hospital Course (Addendum)
76 year old female with a history of dementia, paroxysmal atrial fibrillation on Eliquis, hypertension, anxiety, COPD, dementia with behavioral disturbance, depression was admitted to the hospital with right shoulder deformity and pain.  Patient was noted to have recurrent right shoulder dislocation.  Patient was then seen by orthopedics and was admitted hospital for surgery intervention.  Patient was on Eliquis which was on hold for surgery.  Following conditions were addressed during hospitalization,  Recurrent right shoulder dislocation Orthopedics was consulted and underwent right reverse total shoulder arthroplasty, right open treatment of shoulder dislocation on 10/21/2021.  Currently nonweightbearing in a sling.  Recommendation is to continue sling all the time.  PT and OT  has recommended skilled nursing facility placement.  Patient will need to follow-up with orthopedics in 2 weeks.   Nondisplaced fracture of right foot 1st proximal phalanx Immobilization.  Continue conservative treatment   Paroxysmal atrial fibrillation Continue metoprolol, Eliquis for anticoagulation.   Hypokalemia Replenished and improved.  Latest potassium of 3.7.  Hyponatremia.  Asymptomatic.  Follow-up with BMP in the next visit.   COPD Compensated at this time.  Continue bronchodilators.   History of stroke Continue Lipitor, Eliquis.   Dementia Continue Namenda.  Seroquel at nighttime as needed   Anxiety, depression Continue Zoloft.   Hypertension -Continue losartan and metoprolol.  Blood pressure seems to be stable at this time.  Deconditioning, debility.  Patient has been seen by physical therapy and occupational therapy and recommended skilled nursing facility placement at this time

## 2021-10-22 NOTE — NC FL2 (Addendum)
[place] LEVEL OF CARE SCREENING TOOL     IDENTIFICATION  Patient Name: Marisa Nunez Birthdate: 1945/12/27 Sex: female Admission Date (Current Location): 10/19/2021  [facility] and [place] Number:  Herbalist and Address:  [facility], [place] [address] [place], [place] [address]      Provider Number: [id number]  Attending Physician Name and Address:  Flora Lipps, MD  Relative Name and Phone Number:  [name] [phone]    Current Level of Care: Hospital Recommended Level of Care: [place] Prior Approval Number:    Date Approved/Denied:   PASRR Number: [id number]  Discharge Plan: SNF    Current Diagnoses: Patient Active Problem List   Diagnosis Date Noted   Fracture of phalanx of right foot, closed 10/21/2021   Shoulder dislocation 10/19/2021   COPD (chronic obstructive pulmonary disease) ([place]) 10/19/2021   Dementia ([place]) 10/16/2021   Bursitis of right shoulder 10/01/2021   Drug reaction    Psychosis ([place])    Acute encephalopathy 06/12/2021   Overactive bladder 05/24/2021   Vaginal lesion 05/24/2021   Hyponatremia 05/24/2021   Hypokalemia 05/24/2021   COVID-19 10/30/2020   TIA (transient ischemic attack) 10/30/2020   Cerebral infarction ([place]) 10/29/2020   History of right breast cancer 02/25/2018   History of tobacco use 05/06/2017   Mixed hyperlipidemia 05/06/2017   Osteopenia of multiple sites 05/06/2017   Essential hypertension 10/25/2015   PAF (paroxysmal atrial fibrillation) ([place]) 10/25/2015    Orientation RESPIRATION BLADDER Height & Weight     Self, Time, Place  Normal Continent Weight: 120 lb (54.4 kg) Height:  '5\' 4"'$  (162.6 cm)  BEHAVIORAL SYMPTOMS/MOOD NEUROLOGICAL BOWEL NUTRITION STATUS      Continent Diet (See DC summary)  AMBULATORY STATUS COMMUNICATION OF NEEDS Skin   Limited Assist Verbally Surgical wounds (R shoulder incision)                       Personal Care  Assistance Level of Assistance  Bathing, Feeding, Dressing Bathing Assistance: Limited assistance Feeding assistance: Independent Dressing Assistance: Limited assistance     Functional Limitations Info  Sight, Hearing, Speech Sight Info: Impaired Hearing Info: Adequate Speech Info: Adequate    SPECIAL CARE FACTORS FREQUENCY  PT (By licensed PT), OT (By licensed OT)     PT Frequency: 5x week OT Frequency: 5x week            Contractures Contractures Info: Not present    Additional Factors Info  Code Status, Allergies, Psychotropic Code Status Info: DNR Allergies Info: Influenza Virus Vaccine   Myrbetriq (Mirabegron Er)   Pneumococcal Vaccine   Ativan (Lorazepam) Psychotropic Info: Bupropion, Quetiapine, Sertraline         Current Medications (10/22/2021):  This is the current hospital active medication list Current Facility-Administered Medications  Medication Dose Route Frequency Provider Last Rate Last Admin   apixaban (ELIQUIS) tablet 5 mg  5 mg Oral BID Pokhrel, Laxman, MD       atorvastatin (LIPITOR) tablet 80 mg  80 mg Oral Daily Ethelda Chick, PA-C   80 mg at 10/22/21 0900   bisacodyl (DULCOLAX) EC tablet 5 mg  5 mg Oral Daily PRN Ethelda Chick, PA-C       buPROPion [facility]) tablet 75 mg  75 mg Oral BID Ethelda Chick, PA-C   75 mg at 10/22/21 0948   diphenhydrAMINE (BENADRYL) 12.5 MG/5ML elixir 12.5-25 mg  12.5-25 mg Oral Q4H PRN Noemi Chapel  N, PA-C       docusate sodium (COLACE) capsule 100 mg  100 mg Oral BID Ethelda Chick, PA-C   100 mg at 10/21/21 2012   haloperidol lactate (HALDOL) injection 5 mg  5 mg Intravenous Q6H PRN Ethelda Chick, PA-C   5 mg at 10/20/21 1742   hydrALAZINE (APRESOLINE) injection 10 mg  10 mg Intravenous Q6H PRN Ethelda Chick, PA-C   10 mg at 10/21/21 1103   HYDROcodone-acetaminophen (NORCO) 7.5-325 MG per tablet 1 tablet  1 tablet Oral Q4H PRN McBane, Maylene Roes, PA-C       HYDROcodone-acetaminophen  (NORCO/VICODIN) 5-325 MG per tablet 1 tablet  1 tablet Oral Q4H PRN McBane, Caroline N, PA-C       ipratropium-albuterol (DUONEB) 0.5-2.5 (3) MG/3ML nebulizer solution 3 mL  3 mL Nebulization Q6H PRN McBane, Maylene Roes, PA-C       losartan (COZAAR) tablet 50 mg  50 mg Oral Daily Ethelda Chick, PA-C   50 mg at 10/22/21 0900   memantine (NAMENDA) tablet 10 mg  10 mg Oral BID Ethelda Chick, PA-C   10 mg at 10/22/21 0900   metoCLOPramide (REGLAN) tablet 5-10 mg  5-10 mg Oral Q8H PRN McBane, Maylene Roes, PA-C       Or   metoCLOPramide (REGLAN) injection 5-10 mg  5-10 mg Intravenous Q8H PRN McBane, Maylene Roes, PA-C       metoprolol succinate (TOPROL-XL) 24 hr tablet 50 mg  50 mg Oral Daily McBane, Caroline N, PA-C   50 mg at 10/22/21 0900   morphine (PF) 2 MG/ML injection 0.5 mg  0.5 mg Intravenous Q4H PRN McBane, Caroline N, PA-C       ondansetron (ZOFRAN) tablet 4 mg  4 mg Oral Q6H PRN McBane, Maylene Roes, PA-C       Or   ondansetron (ZOFRAN) injection 4 mg  4 mg Intravenous Q6H PRN McBane, Caroline N, PA-C       QUEtiapine (SEROQUEL) tablet 25 mg  25 mg Oral QHS Ethelda Chick, PA-C   25 mg at 10/21/21 2011   senna-docusate (Senokot-S) tablet 1 tablet  1 tablet Oral QHS PRN Ethelda Chick, PA-C       sertraline (ZOLOFT) tablet 50 mg  50 mg Oral Daily McBane, Caroline N, PA-C   50 mg at 10/22/21 0900     Discharge Medications: Please see discharge summary for a list of discharge medications.  Relevant Imaging Results:  Relevant Lab Results:   Additional Information SS# [place], [place]

## 2021-10-23 ENCOUNTER — Inpatient Hospital Stay (HOSPITAL_COMMUNITY): Payer: Medicare Other

## 2021-10-23 DIAGNOSIS — F0390 Unspecified dementia without behavioral disturbance: Secondary | ICD-10-CM | POA: Diagnosis not present

## 2021-10-23 DIAGNOSIS — J449 Chronic obstructive pulmonary disease, unspecified: Secondary | ICD-10-CM | POA: Diagnosis not present

## 2021-10-23 DIAGNOSIS — I10 Essential (primary) hypertension: Secondary | ICD-10-CM | POA: Diagnosis not present

## 2021-10-23 DIAGNOSIS — S43004A Unspecified dislocation of right shoulder joint, initial encounter: Secondary | ICD-10-CM | POA: Diagnosis not present

## 2021-10-23 NOTE — Progress Notes (Signed)
PROGRESS NOTE    Marisa Nunez  FAO:[id number] DOB: Dec 08, 1945 DOA: 10/19/2021 PCP: Timothy Lasso, MD    Brief Narrative:  76 year old female with a history of dementia, paroxysmal atrial fibrillation on Eliquis, hypertension, anxiety, COPD, dementia with behavioral disturbance, depression was admitted to the hospital with right shoulder deformity and pain.  Patient was noted to have recurrent right shoulder dislocation.  Patient was then seen by orthopedics and was admitted hospital for surgery intervention.  Patient was on Eliquis which was on hold for surgery.  During hospitalization, patient underwent right total shoulder arthroplasty on 10/21/2021.  Physical therapy has seen the patient and currently awaiting for skilled nursing facility placement.  Following conditions were addressed during hospitalization,  Recurrent right shoulder dislocation Orthopedics was consulted and underwent right reverse total shoulder arthroplasty, right open treatment of shoulder dislocation on 10/21/2021.  Currently nonweightbearing in a sling.  PT and OT has been consulted and will need physical therapy to be started in 4 weeks.  We will focus on pain control.  Anticoagulation has been initiated.  At this time, OT, PT has recommended skilled nursing facility placement.   Nondisplaced fracture of right foot 1st proximal phalanx Immobilization.  Continue conservative treatment   Paroxysmal atrial fibrillation Continue metoprolol, Eliquis for anticoagulation.   Hypokalemia Replenished and improved.  Latest potassium of 4.4.  AM.   COPD Compensated at this time.  Continue bronchodilators.   History of stroke Continue Lipitor, Eliquis.   Dementia Continue Namenda.  Seroquel at nighttime as needed   Anxiety, depression Continue Zoloft.   Hypertension -Continue losartan and metoprolol, continue to monitor blood pressure.  Deconditioning, debility.  Patient has been seen by physical therapy and  occupational therapy and recommended skilled nursing facility placement at this time      DVT prophylaxis: SCDs Start: 10/21/21 1849 SCDs Start: 10/19/21 2346 apixaban (ELIQUIS) tablet 5 mg   Code Status:     Code Status: DNR  Disposition: Skilled nursing facility  Status is: Inpatient  Remains inpatient appropriate because: Status post surgery, need for rehabilitation   Family Communication:  Spoke with the patient's daughter on the phone and updated her about the clinical condition of the patient.  Consultants:  Orthopedics.  Procedures:  Right reverse total shoulder arthroplasty on 10/21/2021  Antimicrobials:  None   Subjective: Today, patient was seen and examined at bedside.  Patient denies any nausea vomiting fever or chills.  Has mild shoulder pain.    Objective: Vitals:   10/22/21 0840 10/22/21 0911 10/22/21 1329 10/22/21 2001  BP: 132/78  (!) 112/93 (!) 143/97  Pulse: (!) 56  (!) 106 93  Resp: '18  19 18  '$ Temp: 98.8 F (37.1 C)  99.7 F (37.6 C) 99.2 F (37.3 C)  TempSrc: Oral  Oral Oral  SpO2: 94% 96% 95% 98%  Weight:      Height:        Intake/Output Summary (Last 24 hours) at 10/23/2021 0956 Last data filed at 10/22/2021 1854 Gross per 24 hour  Intake 440 ml  Output --  Net 440 ml   Filed Weights   10/20/21 0058 10/21/21 1132  Weight: 57.1 kg 54.4 kg    Physical Examination: Body mass index is 20.6 kg/m.   General:  Average built, not in obvious distress HENT:   No scleral pallor or icterus noted. Oral mucosa is moist.  Chest:   Diminished breath sounds bilaterally. No crackles or wheezes.  CVS: S1 &S2 heard. No murmur.  Regular rate and  rhythm. Abdomen: Soft, nontender, nondistended.  Bowel sounds are heard.   Extremities: No cyanosis, clubbing or edema.  Peripheral pulses are palpable.  Right upper extremity in a sling Psych: Alert, awake and communicative, underlying dementia. CNS:  No cranial nerve deficits.  .  Right upper extremity  in a sling.  Moving rest of the extremities. Skin: Warm and dry.     Data Reviewed:   CBC: Recent Labs  Lab 10/17/21 0128 10/19/21 2210 10/21/21 0725 10/21/21 1950  WBC 11.6* 10.4 7.7 17.2*  NEUTROABS 9.4*  --   --   --   HGB 13.1 12.5 12.8 13.3  HCT 39.7 38.7 39.3 39.1  MCV 85.9 87.0 84.7 85.0  PLT 308 311 319 387    Basic Metabolic Panel: Recent Labs  Lab 10/17/21 0128 10/19/21 2210 10/20/21 0311 10/21/21 0725 10/21/21 1950  NA 133* 139  --  138  --   K 3.3* 3.1* 3.3* 4.4  --   CL 100 108  --  103  --   CO2 23 24  --  27  --   GLUCOSE 127* 96  --  103*  --   BUN 11 14  --  11  --   CREATININE 0.89 0.68  --  0.65 0.75  CALCIUM 9.2 9.4  --  9.8  --   MG  --   --  1.7  --   --     Liver Function Tests: Recent Labs  Lab 10/17/21 0128  AST 18  ALT 13  ALKPHOS 110  BILITOT 0.7  PROT 6.1*  ALBUMIN 3.7     Radiology Studies: DG Shoulder Right Port  Result Date: 10/21/2021 CLINICAL DATA:  Status post right shoulder replacement surgery. EXAM: RIGHT SHOULDER - 1 VIEW COMPARISON:  None Available. FINDINGS: A right shoulder replacement is seen without evidence of surrounding lucency. There is no evidence of an acute fracture or dislocation. Degenerative changes are seen involving the right acromioclavicular joint. Mild diffuse soft tissue swelling is seen with soft tissue air noted below the right acromion. IMPRESSION: Status post right shoulder replacement without evidence of hardware complication. Electronically Signed   By: Virgina Norfolk M.D.   On: 10/21/2021 18:52      LOS: 4 days    Flora Lipps, MD [facility] Available via Epic secure chat 7am-7pm After these hours, please refer to coverage provider listed on [email or web address] 10/23/2021, 9:56 AM

## 2021-10-23 NOTE — TOC Progression Note (Signed)
Transition of Care [facility]) - Progression Note    Patient Details  Name: Rio Taber MRN: [id number] Date of Birth: 1945/09/01  Transition of Care [facility]) CM/SW [place], [place] Phone Number: 10/23/2021, 3:04 PM  Clinical Narrative:     CSW met with pt and daughter in room and discussed bed offers. Family and patient chose [facility]. Facility notified and authorization started. PASSR approved and FL2 updated. Plan to DC when authorization is approved.  Expected Discharge Plan: [place] Barriers to Discharge: Continued Medical Work up, SNF Pending bed offer, Ship broker  Expected Discharge Plan and Services Expected Discharge Plan: [place] arrangements for the past 2 months: Single Family Home                                       Social Determinants of Health (SDOH) Interventions    Readmission Risk Interventions     No data to display

## 2021-10-23 NOTE — Progress Notes (Signed)
Transition of Care [facility]) - CAGE-AID Screening   Patient Details  Name: Marisa Nunez MRN: [id number] Date of Birth: 12/21/1945  Elvina Sidle, RN Trauma Response Nurse Phone Number: [phone] 10/23/2021, 10:54 AM       CAGE-AID Screening:               Substance Abuse Education Offered: No (Denies use of alcohol or drugs)

## 2021-10-23 NOTE — Progress Notes (Signed)
   ORTHOPAEDIC PROGRESS NOTE  s/p Procedure(s): RIGHT REVERSE TOTAL SHOULDER ARTHROPLASTY for chronic right shoulder dislocation on 10/21/21  SUBJECTIVE: She was asleep in the bed without her sling on this afternoon. Sling was in her recliner. Reports moderate pain about operative site. She states she has not been using her right arm.   No chest pain. No SOB. No nausea/vomiting. No other complaints.  OBJECTIVE: PE: General: sitting up in recliner, NAD RUE: Dressing CDI. Sling replaced. Does have swelling of the right upper extremity. + Motor in  AIN, PIN, Ulnar distributions. She endorses axillary nerve sensation which she states is symmetric to contralateral side. Was able to appreciate a slight deltoid motor function but weaker compared to contralateral side. This was noted preoperatively  Sensation intact in medial, radial, and ulnar distributions. Well perfused digits.    Vitals:   10/22/21 2001 10/23/21 1125  BP: (!) 143/97 117/87  Pulse: 93 80  Resp: 18 18  Temp: 99.2 F (37.3 C) 98.2 F (36.8 C)  SpO2: 98% 97%   Reordered x-rays as patient has not been in her sling. She is at a high risk for dislocation.   ASSESSMENT: Marisa Nunez is a 76 y.o. female . POD#2  PLAN: Weightbearing: NWB RUE - okay for hand/wrist/elbow exercises  - NO passive or active ROM of the shoulder Insicional and dressing care: Reinforce dressings as needed Orthopedic device(s):  Sling  at all times! Showering: Post-op day #2 with assistance VTE prophylaxis: Resume Eliquis Pain control: PRN pain medications, minimize narcotics as able Follow - up plan: 2 weeks in office Dispo: PT/OT recommending SNF. TOC following.  Contact information:  Dr. Ophelia Charter, Noemi Chapel PA-C, After hours and holidays please check [email or web address] for group call information for [facility]  Discussed the importance of keeping her sling in place with the patient. However with her dementia she will likely continue to  remove. Please maintain shoulder sling at all times.   Noemi Chapel, PA-C 10/23/2021

## 2021-10-23 NOTE — Progress Notes (Signed)
Occupational Therapy Treatment Patient Details Name: Marisa Nunez MRN: [id number] DOB: 1945/09/15 Today's Date: 10/23/2021   History of present illness Pt is a 76 y/o female who presented for reverse TSA of R shoulder on 6/26 due to continued pain/deformity of R shoulder dislocation after fall/ED visit on 6/22. Pt also with R great toe fx noted on 6/22.  Pt with chronic issues of R shoulder including arthocentesis and aspiration of subdeltoid effusion in setting of osteoarthritis at PCP on 6/6. Right foot xray: Subtle, nondisplaced fracture deformity involving the mid and  distal shaft of the first proximal phalanx PMH: anxiety, cancer, COPD, dementia, CVA   OT comments  Pt assisted to use BSC urgently with min assist from recliner. Doffed shoulder immobilizer, completed AROM R elbow to hand, donned shoulder immobilizer and supported R UE with pillow in chair. Pt needing set up to self feed with utensils, placed order for finger foods for dinner.    Recommendations for follow up therapy are one component of a multi-disciplinary discharge planning process, led by the attending physician.  Recommendations may be updated based on patient status, additional functional criteria and insurance authorization.    Follow Up Recommendations  Skilled nursing-short term rehab (<3 hours/day)    Assistance Recommended at Discharge Frequent or constant Supervision/Assistance  Patient can return home with the following  A little help with walking and/or transfers;A lot of help with bathing/dressing/bathroom   Equipment Recommendations  BSC/3in1    Recommendations for Other Services      Precautions / Restrictions Precautions Precautions: Fall;Shoulder Type of Shoulder Precautions: ok for hand, wrist and elbow ROM, no shoulder movement Shoulder Interventions: Shoulder sling/immobilizer;At all times Precaution Booklet Issued: No Required Braces or Orthoses: Sling;Other Brace Other Brace: R post op  shoe Restrictions Weight Bearing Restrictions: Yes RUE Weight Bearing: Non weight bearing RLE Weight Bearing: Weight bearing as tolerated       Mobility Bed Mobility               General bed mobility comments: sitting up in chair upon arrival    Transfers Overall transfer level: Needs assistance Equipment used: 1 person hand held assist Transfers: Sit to/from Stand, Bed to chair/wheelchair/BSC Sit to Stand: Min guard Stand pivot transfers: Min assist               Balance Overall balance assessment: Needs assistance   Sitting balance-Leahy Scale: Fair       Standing balance-Leahy Scale: Poor Standing balance comment: requires single UE support                           ADL either performed or assessed with clinical judgement   ADL Overall ADL's : Needs assistance/impaired Eating/Feeding: Minimal assistance;Sitting Eating/Feeding Details (indicate cue type and reason): difficulty self feeding with non dominant hand                     Toilet Transfer: Min guard;Minimal assistance;Stand-pivot;BSC/3in1   Toileting- Clothing Manipulation and Hygiene: Minimal assistance;Sitting/lateral lean              Extremity/Trunk Assessment Upper Extremity Assessment Upper Extremity Assessment: RUE deficits/detail RUE Deficits / Details: performed elbow to hand ROM x 10 each            Vision       Perception     Praxis      Cognition Arousal/Alertness: Awake/alert Behavior During Therapy: WFL for tasks assessed/performed, Flat affect  Overall Cognitive Status: History of cognitive impairments - at baseline                                 General Comments: pt with h/o dementia        Exercises      Shoulder Instructions       General Comments      Pertinent Vitals/ Pain       Pain Assessment Pain Assessment: Faces Faces Pain Scale: Hurts little more Pain Location: R shoulder Pain Descriptors /  Indicators: Grimacing, Guarding Pain Intervention(s): Repositioned  Home Living                                          Prior Functioning/Environment              Frequency  Min 2X/week        Progress Toward Goals  OT Goals(current goals can now be found in the care plan section)  Progress towards OT goals: Not progressing toward goals - comment  Acute Rehab OT Goals OT Goal Formulation: With patient Time For Goal Achievement: 11/05/21 Potential to Achieve Goals: Good  Plan Discharge plan remains appropriate    Co-evaluation                 AM-PAC OT "6 Clicks" Daily Activity     Outcome Measure   Help from another person eating meals?: A Little Help from another person taking care of personal grooming?: A Little Help from another person toileting, which includes using toliet, bedpan, or urinal?: A Little Help from another person bathing (including washing, rinsing, drying)?: A Lot Help from another person to put on and taking off regular upper body clothing?: Total Help from another person to put on and taking off regular lower body clothing?: A Lot 6 Click Score: 14    End of Session    OT Visit Diagnosis: Unsteadiness on feet (R26.81);Other abnormalities of gait and mobility (R26.89);Pain Pain - Right/Left: Right Pain - part of body: Shoulder   Activity Tolerance Patient tolerated treatment well;Patient limited by pain   Patient Left in chair;with call bell/phone within reach;with chair alarm set   Nurse Communication          Time: 1455-1520 OT Time Calculation (min): 25 min  Charges: OT General Charges $OT Visit: 1 Visit OT Treatments $Self Care/Home Management : 8-22 mins $Therapeutic Exercise: 8-22 mins  Marisa Nunez, OTR/L Acute Rehabilitation Services Office: [phone]   Marisa Nunez, Marisa Nunez 10/23/2021, 3:37 PM

## 2021-10-23 NOTE — Plan of Care (Signed)
  Problem: Education: Goal: Knowledge of General Education information will improve Description: Including pain rating scale, medication(s)/side effects and non-pharmacologic comfort measures Outcome: Not Progressing   Problem: Health Behavior/Discharge Planning: Goal: Ability to manage health-related needs will improve Outcome: Not Progressing   Problem: Clinical Measurements: Goal: Ability to maintain clinical measurements within normal limits will improve Outcome: Not Progressing Goal: Will remain free from infection Outcome: Not Progressing Goal: Diagnostic test results will improve Outcome: Not Progressing   Problem: Nutrition: Goal: Adequate nutrition will be maintained Outcome: Not Progressing   Problem: Coping: Goal: Level of anxiety will decrease Outcome: Not Progressing   Problem: Elimination: Goal: Will not experience complications related to bowel motility Outcome: Not Progressing Goal: Will not experience complications related to urinary retention Outcome: Not Progressing

## 2021-10-24 DIAGNOSIS — I10 Essential (primary) hypertension: Secondary | ICD-10-CM | POA: Diagnosis not present

## 2021-10-24 DIAGNOSIS — S43004A Unspecified dislocation of right shoulder joint, initial encounter: Secondary | ICD-10-CM | POA: Diagnosis not present

## 2021-10-24 DIAGNOSIS — F0390 Unspecified dementia without behavioral disturbance: Secondary | ICD-10-CM | POA: Diagnosis not present

## 2021-10-24 DIAGNOSIS — J449 Chronic obstructive pulmonary disease, unspecified: Secondary | ICD-10-CM | POA: Diagnosis not present

## 2021-10-24 DIAGNOSIS — S92414A Nondisplaced fracture of proximal phalanx of right great toe, initial encounter for closed fracture: Secondary | ICD-10-CM

## 2021-10-24 LAB — CBC
HCT: 33.3 % — ABNORMAL LOW (ref 36.0–46.0)
Hemoglobin: 11.3 g/dL — ABNORMAL LOW (ref 12.0–15.0)
MCH: 28.5 pg (ref 26.0–34.0)
MCHC: 33.9 g/dL (ref 30.0–36.0)
MCV: 84.1 fL (ref 80.0–100.0)
Platelets: 298 10*3/uL (ref 150–400)
RBC: 3.96 MIL/uL (ref 3.87–5.11)
RDW: 14 % (ref 11.5–15.5)
WBC: 11.9 10*3/uL — ABNORMAL HIGH (ref 4.0–10.5)
nRBC: 0 % (ref 0.0–0.2)

## 2021-10-24 LAB — BASIC METABOLIC PANEL
Anion gap: 7 (ref 5–15)
BUN: 19 mg/dL (ref 8–23)
CO2: 29 mmol/L (ref 22–32)
Calcium: 8.7 mg/dL — ABNORMAL LOW (ref 8.9–10.3)
Chloride: 92 mmol/L — ABNORMAL LOW (ref 98–111)
Creatinine, Ser: 0.97 mg/dL (ref 0.44–1.00)
GFR, Estimated: >60 mL/min (ref >60)
Glucose, Bld: 116 mg/dL — ABNORMAL HIGH (ref 70–99)
Potassium: 3.7 mmol/L (ref 3.5–5.1)
Sodium: 128 mmol/L — ABNORMAL LOW (ref 135–145)

## 2021-10-24 LAB — MAGNESIUM: Magnesium: 1.7 mg/dL (ref 1.7–2.4)

## 2021-10-24 MED ORDER — HYDROCODONE-ACETAMINOPHEN 5-325 MG PO TABS
1.0000 | ORAL_TABLET | Freq: Four times a day (QID) | ORAL | 0 refills | Status: DC | PRN
Start: 2021-10-24 — End: 2021-11-28

## 2021-10-24 MED ORDER — ACETAMINOPHEN 500 MG PO TABS: 500.0000 mg | ORAL_TABLET | Freq: Four times a day (QID) | ORAL | 0 refills | Status: AC | PRN

## 2021-10-24 MED ORDER — DOCUSATE SODIUM 100 MG PO CAPS: 100.0000 mg | ORAL_CAPSULE | Freq: Two times a day (BID) | ORAL | 0 refills | Status: DC | PRN

## 2021-10-24 MED ORDER — QUETIAPINE FUMARATE 25 MG PO TABS: 25.0000 mg | ORAL_TABLET | Freq: Every day | ORAL | Status: DC

## 2021-10-24 NOTE — Progress Notes (Signed)
Physical Therapy Treatment Patient Details Name: Marisa Nunez MRN: [id number] DOB: 29-Oct-1945 Today's Date: 10/24/2021   History of Present Illness Pt is a 76 y/o female who presented for reverse TSA of R shoulder on 6/26 due to continued pain/deformity of R shoulder dislocation after fall/ED visit on 6/22. Pt also with R great toe fx noted on 6/22.  Pt with chronic issues of R shoulder including arthocentesis and aspiration of subdeltoid effusion in setting of osteoarthritis at PCP on 6/6. Right foot xray: Subtle, nondisplaced fracture deformity involving the mid and  distal shaft of the first proximal phalanx PMH: anxiety, cancer, COPD, dementia, CVA    PT Comments    Today's skilled session continued to address mobility progression, however limited by pain. Pt seated in recliner in tears with reports of shoulder pain. Noted sling to be mal positioned. Repositioned sling on right shoulder and notified RN of pain. Pt agreeable to short distance gait in room to wash hand after blowing her nose several times. Pt left in recliner with needs in reach and no longer in tears with RN on way to room with pain med's (RN had been in while at sink to rate pain and see what pt was wanting). Acute PT to continue during pt's hospital stay.    Recommendations for follow up therapy are one component of a multi-disciplinary discharge planning process, led by the attending physician.  Recommendations may be updated based on patient status, additional functional criteria and insurance authorization.  Follow Up Recommendations  Skilled nursing-short term rehab (<3 hours/day) Can patient physically be transported by private vehicle: Yes   Assistance Recommended at Discharge Frequent or constant Supervision/Assistance  Patient can return home with the following A little help with walking and/or transfers;A lot of help with bathing/dressing/bathroom;Assistance with cooking/housework;Direct supervision/assist for  medications management;Direct supervision/assist for financial management;Assist for transportation;Help with stairs or ramp for entrance   Equipment Recommendations  Cane    Precautions / Restrictions Precautions Precautions: Fall;Shoulder Type of Shoulder Precautions: ok for hand, wrist and elbow ROM, no shoulder movement Shoulder Interventions: Shoulder sling/immobilizer;At all times Precaution Booklet Issued: No Precaution Comments: donjoy sling w/ abduction wedge Required Braces or Orthoses: Sling;Other Brace Other Brace: R post op shoe Restrictions Weight Bearing Restrictions: Yes RUE Weight Bearing: Non weight bearing RLE Weight Bearing: Weight bearing as tolerated Other Position/Activity Restrictions: ok for hand/wrist/elbow AROM. NO shoulder ROM per order set     Mobility  Bed Mobility               General bed mobility comments: sitting up in chair upon arrival    Transfers Overall transfer level: Needs assistance Equipment used: 1 person hand held assist Transfers: Sit to/from Stand Sit to Stand: Min guard                Ambulation/Gait Ambulation/Gait assistance: Min assist Gait Distance (Feet): 30 Feet Assistive device: 1 person hand held assist Gait Pattern/deviations: Step-through pattern, Decreased stride length, Drifts right/left Gait velocity: decreased     General Gait Details: HHA due to no cane in room. limited to room distance due to pt's pain/crying and waiting for RN to bring meds. able to walk to sink at door to wash hand and back to chair. min guard for balance at sink with set up to min assist to wash left hand.       Cognition Arousal/Alertness: Awake/alert Behavior During Therapy: WFL for tasks assessed/performed, Flat affect Overall Cognitive Status: History of cognitive impairments - at  baseline Area of Impairment: Memory, Following commands, Safety/judgement, Problem solving, Awareness, Orientation                  Orientation Level: Disoriented to, Place, Time, Situation   Memory: Decreased short-term memory Following Commands: Follows one step commands with increased time Safety/Judgement: Decreased awareness of deficits, Decreased awareness of safety Awareness: Intellectual Problem Solving: Difficulty sequencing, Requires verbal cues, Slow processing General Comments: pt with h/o dementia         Pertinent Vitals/Pain Pain Assessment Pain Assessment: 0-10 Pain Score: 10-Worst pain ever Pain Location: R shoulder Pain Descriptors / Indicators: Grimacing, Guarding, Crying Pain Intervention(s): Limited activity within patient's tolerance, Monitored during session, Repositioned, RN gave pain meds during session     PT Goals (current goals can now be found in the care plan section) Acute Rehab PT Goals Patient Stated Goal: pt daughter would like her to go to rehab PT Goal Formulation: With patient/family Time For Goal Achievement: 11/05/21 Potential to Achieve Goals: Good Progress towards PT goals: Progressing toward goals    Frequency    Min 3X/week      PT Plan Current plan remains appropriate    AM-PAC PT "6 Clicks" Mobility   Outcome Measure  Help needed turning from your back to your side while in a flat bed without using bedrails?: A Little Help needed moving from lying on your back to sitting on the side of a flat bed without using bedrails?: A Little Help needed moving to and from a bed to a chair (including a wheelchair)?: A Little Help needed standing up from a chair using your arms (e.g., wheelchair or bedside chair)?: A Little Help needed to walk in hospital room?: A Little Help needed climbing 3-5 steps with a railing? : A Lot 6 Click Score: 17    End of Session Equipment Utilized During Treatment: Gait belt;Other (comment) (sling on right arm) Activity Tolerance: Patient tolerated treatment well;Patient limited by pain Patient left: in chair;with call  bell/phone within reach;with chair alarm set Nurse Communication: Mobility status;Patient requests pain meds PT Visit Diagnosis: Unsteadiness on feet (R26.81);Pain Pain - Right/Left: Right Pain - part of body: Shoulder     Time: 3662-9476 PT Time Calculation (min) (ACUTE ONLY): 12 min  Charges:  $Gait Training: 8-22 mins                    Willow Ora, PTA, [facility] [address], [place], [place] [address] [phone] 10/24/21, 10:46 AM

## 2021-10-24 NOTE — TOC Transition Note (Addendum)
Transition of Care [facility]) - CM/SW Discharge Note   Patient Details  Name: Marisa Nunez MRN: [id number] Date of Birth: 01-Apr-1946  Transition of Care [facility]) CM/SW Contact:  Coralee Pesa, [place] Phone Number: 10/24/2021, 12:01 PM   Clinical Narrative:    Pt to be [facility] via [place]. Nurse to call report to [phone]   Final next level of care: Skilled Nursing Facility Barriers to Discharge: Barriers Resolved   Patient Goals and CMS Choice Patient states their goals for this hospitalization and ongoing recovery are:: Pt would like to get better to return home. CMS Medicare.gov Compare Post Acute Care list provided to:: Patient Choice offered to / list presented to : Patient, Adult Children  Discharge Placement              Patient chooses bed at:  [facility]) Patient to be transferred to facility by: [facility] Name of family member notified: [name] Patient and family notified of of transfer: 10/24/21  Discharge Plan and Services                                     Social Determinants of Health (SDOH) Interventions     Readmission Risk Interventions     No data to display

## 2021-10-24 NOTE — Discharge Instructions (Signed)
Ophelia Charter MD, MPH Noemi Chapel, PA-C [place] [address] [phone] (tel)   [phone] (fax)   [place] may leave the operative dressing in place until your follow-up appointment. KEEP THE INCISIONS CLEAN AND DRY. There may be a small amount of fluid/bleeding leaking at the surgical site. This is normal after surgery.  If it fills with liquid or blood please call us immediately to change it for you. Use the provided ice machine or Ice packs as often as possible for the first 3-4 days, then as needed for pain relief.   Keep a layer of cloth or a shirt between your skin and the cooling unit to prevent frost bite as it can get very cold.  SHOWERING: - You may shower on Post-Op Day #2.  - The dressing is water resistant but do not scrub it as it may start to peel up.   - You may remove the sling for showering - Gently pat the area dry.  - Do not soak the shoulder in water.  - Do not go swimming in the pool or ocean until your incision has completely healed (about 4-6 weeks after surgery) - KEEP THE INCISIONS CLEAN AND DRY.  EXERCISES Wear the sling at all times  You may remove the sling for showering, but keep the arm across the chest or in a secondary sling.    Accidental/Purposeful External Rotation and shoulder flexion (reaching behind you) is to be avoided at all costs for the first month.  It is normal for your fingers/hand to become more swollen after surgery and discolored from bruising.   This will resolve over the first few weeks usually after surgery. Please continue to ambulate and do not stay sitting or lying for too long.  Perform foot and wrist pumps to assist in circulation.  POST-OP MEDICATIONS  Hydrocodone-acetaminophen (Norco) - this is a strong pain medication to take only on an as needed basis for severe pain   FOLLOW-UP If you develop a Fever  (>101.5), Redness or Drainage from the surgical incision site, please call our office to arrange for an evaluation. Please call the office to schedule a follow-up appointment for a wound check, 14 days post-operatively.  IF YOU HAVE ANY QUESTIONS, PLEASE FEEL FREE TO CALL OUR OFFICE.  HELPFUL INFORMATION  Your arm will be in a sling following surgery. You will be in this sling for the next 4 weeks.   You may be more comfortable sleeping in a semi-seated position the first few nights following surgery.  Keep a pillow propped under the elbow and forearm for comfort.  If you have a recliner type of chair it might be beneficial.  If not that is fine too, but it would be helpful to sleep propped up with pillows behind your operated shoulder as well under your elbow and forearm.  This will reduce pulling on the suture lines.  When dressing, put your operative arm in the sleeve first.  When getting undressed, take your operative arm out last.  Loose fitting, button-down shirts are recommended.  In most states it is against the law to drive while your arm is in a sling. And certainly against the law to drive while taking narcotics.  You may return to work/school in the next couple of days when you feel up to it. Desk work and typing in the sling is fine.  We suggest you use the  pain medication the first night prior to going to bed, in order to ease any pain when the anesthesia wears off. You should avoid taking pain medications on an empty stomach as it will make you nauseous.  You should wean off your narcotic medicines as soon as you are able.     Most patients will be off or using minimal narcotics before their first postop appointment.   Do not drink alcoholic beverages or take illicit drugs when taking pain medications.  Pain medication may make you constipated.  Below are a few solutions to try in this order: Decrease the amount of pain medication if you aren't having pain. Drink lots of  decaffeinated fluids. Drink prune juice and/or each dried prunes  If the first 3 don't work start with additional solutions Take Colace - an over-the-counter stool softener Take Senokot - an over-the-counter laxative Take Miralax - a stronger over-the-counter laxative   Dental Antibiotics:  In most cases prophylactic antibiotics for Dental procdeures after total joint surgery are not necessary.  Exceptions are as follows:  1. History of prior total joint infection  2. Severely immunocompromised (Organ Transplant, cancer chemotherapy, Rheumatoid biologic meds such as Cushing)  3. Poorly controlled diabetes (A1C &gt; 8.0, blood glucose over 200)  If you have one of these conditions, contact your surgeon for an antibiotic prescription, prior to your dental procedure.   For more information including helpful videos and documents visit our website:   [email or web address]

## 2021-10-24 NOTE — Discharge Summary (Signed)
Physician Discharge Summary   Patient: Marisa Nunez MRN: [id number] DOB: [birth year]  Admit date:     10/19/2021  Discharge date:   Discharge Physician: Flora Lipps   PCP: Timothy Lasso, MD   Recommendations at discharge:   Follow-up with the primary care provider at the skilled nursing facility in 3 to 5 days.  Check CBC BMP at the next visit. Follow-up with [facility] orthopedics in 2 weeks for postoperative checkup. Keep the right arm in the sling all the time.  Nonweightbearing of right upper extremity, okay for hand wrist and elbow exercises.  No passive or active range of movement of the shoulder.  Discharge Diagnoses: Principal Problem:   Shoulder dislocation Active Problems:   Essential hypertension   PAF (paroxysmal atrial fibrillation) (HCC)   Dementia (HCC)   COPD (chronic obstructive pulmonary disease) (HCC)   Fracture of phalanx of right foot, closed  Resolved Problems:   * No resolved hospital problems. *  Hospital Course: 76 year old female with a history of dementia, paroxysmal atrial fibrillation on Eliquis, hypertension, anxiety, COPD, dementia with behavioral disturbance, depression was admitted to the hospital with right shoulder deformity and pain.  Patient was noted to have recurrent right shoulder dislocation.  Patient was then seen by orthopedics and was admitted hospital for surgery intervention.  Patient was on Eliquis which was on hold for surgery.  Following conditions were addressed during hospitalization,  Recurrent right shoulder dislocation Orthopedics was consulted and underwent right reverse total shoulder arthroplasty, right open treatment of shoulder dislocation on 10/21/2021.  Currently nonweightbearing in a sling.  Recommendation is to continue sling all the time.  PT and OT  has recommended skilled nursing facility placement.  Patient will need to follow-up with orthopedics in 2 weeks.   Nondisplaced fracture of right foot 1st proximal  phalanx Immobilization.  Continue conservative treatment   Paroxysmal atrial fibrillation Continue metoprolol, Eliquis for anticoagulation.   Hypokalemia Replenished and improved.  Latest potassium of 3.7.  Hyponatremia.  Asymptomatic.  Follow-up with BMP in the next visit.   COPD Compensated at this time.  Continue bronchodilators.   History of stroke Continue Lipitor, Eliquis.   Dementia Continue Namenda.  Seroquel at nighttime as needed   Anxiety, depression Continue Zoloft.   Hypertension -Continue losartan and metoprolol.  Blood pressure seems to be stable at this time.  Deconditioning, debility.  Patient has been seen by physical therapy and occupational therapy and recommended skilled nursing facility placement at this time  Consultants: Orthopedics  Procedures performed:  right reverse total shoulder arthroplasty, right open treatment of shoulder dislocation on 10/21/2021.   Disposition: Skilled nursing facility Diet recommendation:  Discharge Diet Orders (From admission, onward)     Start     Ordered   10/24/21 0000  Diet general        10/24/21 0914           Regular diet DISCHARGE MEDICATION: Allergies as of 10/24/2021       Reactions   Influenza Virus Vaccine Hives   Myrbetriq [mirabegron Er]    Confusion, agitation, hallucination    Pneumococcal Vaccine Hives   Ativan [lorazepam]    Makes pt agitated per daughter         Medication List     TAKE these medications    acetaminophen 500 MG tablet Commonly known as: TYLENOL Take 1 tablet (500 mg total) by mouth every 6 (six) hours as needed for moderate pain, fever or mild pain. What changed:  how much to  take reasons to take this   apixaban 5 MG Tabs tablet Commonly known as: ELIQUIS Take 1 tablet (5 mg total) by mouth 2 (two) times daily.   aspirin EC 81 MG tablet Take 81 mg by mouth daily. Swallow whole.   atorvastatin 80 MG tablet Commonly known as: LIPITOR TAKE 1 TABLET(80  MG) BY MOUTH DAILY   buPROPion 75 MG tablet Commonly known as: WELLBUTRIN Take 1 tablet by mouth twice daily.   docusate sodium 100 MG capsule Commonly known as: COLACE Take 1 capsule (100 mg total) by mouth 2 (two) times daily as needed for mild constipation or moderate constipation.   HYDROcodone-acetaminophen 5-325 MG tablet Commonly known as: Norco Take 1 tablet by mouth every 6 (six) hours as needed for severe pain.   losartan 50 MG tablet Commonly known as: COZAAR Take 1 tablet (50 mg total) by mouth daily.   memantine 10 MG tablet Commonly known as: NAMENDA Take 1 tablet by mouth in the morning. What changed: when to take this   metoprolol succinate 50 MG 24 hr tablet Commonly known as: TOPROL-XL Take 1 tablet (50 mg total) by mouth daily. Take with or immediately following a meal.   multivitamin with minerals tablet Take 1 tablet by mouth daily.   potassium chloride 10 MEQ tablet Commonly known as: KLOR-CON Take 1 tablet by mouth daily.   QUEtiapine 25 MG tablet Commonly known as: SEROQUEL Take 1 tablet (25 mg total) by mouth at bedtime.   sertraline 50 MG tablet Commonly known as: ZOLOFT Take 1 tablet by mouth daily.        Follow-up Information     Marlou Sa Tonna Corner, MD Follow up in 2 week(s).   Specialty: Orthopedic Surgery Why: Postoperative follow-up. Contact information: [place] [address] [phone]         Timothy Lasso, MD Follow up.   Specialty: Internal Medicine Contact information: [place] [address] [phone]                Subjective Today, patient was seen and examined at bedside.  Patient denies any nausea vomiting fever chills or rigor.  Denies overt pain.  Feels overall okay.  Discharge Exam: Filed Weights   10/20/21 0058 10/21/21 1132  Weight: 57.1 kg 54.4 kg      10/24/2021    8:04 AM 10/24/2021    3:42 AM 10/23/2021    7:31 PM  Vitals with BMI  Systolic  [phone]  Diastolic 70 53 83  Pulse 95 48 92    General:  Average built, not in obvious distress HENT:   No scleral pallor or icterus noted. Oral mucosa is moist.  Chest:    Diminished breath sounds bilaterally. No crackles or wheezes.  CVS: S1 &S2 heard. No murmur.  Regular rate and rhythm. Abdomen: Soft, nontender, nondistended.  Bowel sounds are heard.   Extremities: No cyanosis, clubbing or edema.  Peripheral pulses are palpable.  Right extremity in a sling. Psych: Alert, awake and communicative, underlying dementia, CNS:  No cranial nerve deficits.  Power equal in all extremities.   Skin: Warm and dry.  Right shoulder surgery.   Condition at discharge: good  The results of significant diagnostics from this hospitalization (including imaging, microbiology, ancillary and laboratory) are listed below for reference.   Imaging Studies: DG Shoulder Right Port  Result Date: 10/23/2021 CLINICAL DATA:  Postop check. EXAM: RIGHT SHOULDER - 1 VIEW COMPARISON:  Right shoulder radiographs 10/21/2021  FINDINGS: Sequelae of recent right reverse total shoulder arthroplasty are again identified. The prosthetic components are unchanged in position. No acute fracture or dislocation is identified. Swelling and a small amount of gas remain in the surrounding soft tissues. IMPRESSION: Unchanged appearance of right shoulder arthroplasty. No acute osseous abnormality identified. Electronically Signed   By: Logan Bores M.D.   On: 10/23/2021 15:22   DG Shoulder Right Port  Result Date: 10/21/2021 CLINICAL DATA:  Status post right shoulder replacement surgery. EXAM: RIGHT SHOULDER - 1 VIEW COMPARISON:  None Available. FINDINGS: A right shoulder replacement is seen without evidence of surrounding lucency. There is no evidence of an acute fracture or dislocation. Degenerative changes are seen involving the right acromioclavicular joint. Mild diffuse soft tissue swelling is seen with soft tissue air noted below  the right acromion. IMPRESSION: Status post right shoulder replacement without evidence of hardware complication. Electronically Signed   By: Virgina Norfolk M.D.   On: 10/21/2021 18:52   CT Shoulder Right Wo Contrast  Result Date: 10/19/2021 CLINICAL DATA:  Shoulder dislocation, postreduction. EXAM: CT OF THE UPPER RIGHT EXTREMITY WITHOUT CONTRAST TECHNIQUE: Multidetector CT imaging of the upper right extremity was performed according to the standard protocol. RADIATION DOSE REDUCTION: This exam was performed according to the departmental dose-optimization program which includes automated exposure control, adjustment of the mA and/or kV according to patient size and/or use of iterative reconstruction technique. COMPARISON:  10/19/2021. FINDINGS: Bones/Joint/Cartilage There is persistent anterior dislocation of the right humeral head. Mild degenerative changes are present at the acromioclavicular joint and moderate degenerative changes at the humeral head and glenoid. There is sclerosis at the humeral head which may be due to degenerative changes, the possibility of impaction fracture can not be completely excluded. Additional corticated and scattered bony densities are noted adjacent to the glenoid which may be acute or chronic. There is a large simple joint effusion about the shoulder containing bony densities. Ligaments Suboptimally assessed by CT. Muscles and Tendons No intramuscular hematoma. Soft tissues Mild fat stranding is noted in the axilla IMPRESSION: 1. Persistent anterior dislocation of the right humeral head. 2. Large joint effusion at the right shoulder containing bony densities, possible acute or chronic fracture fragments versus loose bodies. 3. Moderate degenerative changes at the humeral head and glenoid. There is sclerosis at the humeral head which may be related to underlying arthritis. The possibility of impaction fracture can not be completely excluded. 4. Mild degenerative changes at  the acromioclavicular joint. Electronically Signed   By: Brett Fairy M.D.   On: 10/19/2021 21:40   DG Shoulder Right Portable  Result Date: 10/19/2021 CLINICAL DATA:  Shoulder reduction. EXAM: RIGHT SHOULDER - 1 VIEW COMPARISON:  10/19/2021. FINDINGS: There is redemonstration of anterior dislocation of the right humeral head, unchanged from the prior exam. No acute fracture is seen. Degenerative changes are present at the acromioclavicular joint. The soft tissues are within normal limits. IMPRESSION: Persistent anterior dislocation of the right humeral head. Electronically Signed   By: Brett Fairy M.D.   On: 10/19/2021 20:37   DG Shoulder Right  Result Date: 10/19/2021 CLINICAL DATA:  Right shoulder pain and dislocation 2 days ago. EXAM: RIGHT SHOULDER - 2+ VIEW COMPARISON:  10/17/2021 FINDINGS: Anterior dislocation of the right shoulder is again seen. No acute fracture identified. IMPRESSION: Anterior dislocation of right shoulder. No acute fracture identified. Electronically Signed   By: Marlaine Hind M.D.   On: 10/19/2021 16:03   DG Foot Complete Right  Result Date:  10/17/2021 CLINICAL DATA:  Foot pain. EXAM: RIGHT FOOT COMPLETE - 3+ VIEW COMPARISON:  None FINDINGS: Subtle, nondisplaced fracture deformity involving the mid and distal shaft of the first proximal phalanx is suspected. The fracture fragments are nondisplaced. No additional signs of acute fracture or dislocation. There is a small calcific density identified within the soft tissues adjacent to the third middle phalanx. Most likely chronic. IMPRESSION: 1. Subtle, nondisplaced fracture deformity involving the mid and distal shaft of the first proximal phalanx. 2. Chronic appearing calcific density within the soft tissues adjacent to the third middle phalanx. Electronically Signed   By: Kerby Moors M.D.   On: 10/17/2021 17:17   DG Shoulder Right Portable  Result Date: 10/17/2021 CLINICAL DATA:  Post reduction. EXAM: RIGHT SHOULDER -  1 VIEW COMPARISON:  Earlier same day FINDINGS: Single AP view of the right shoulder shows apparent interval reduction of the shoulder dislocation although glenohumeral relationship is not well evaluated on AP imaging. IMPRESSION: Probable reduction of the humeral head dislocation. If clinical picture is equivocal, consider scapular Y-view or axillary projection to confirm. Electronically Signed   By: Misty Stanley M.D.   On: 10/17/2021 07:17   DG Shoulder Right  Result Date: 10/17/2021 CLINICAL DATA:  Post reduction. EXAM: RIGHT SHOULDER - 2+ VIEW COMPARISON:  Earlier same day FINDINGS: Two view study. Persistent anterior subcoracoid dislocation of the humeral head evident. Osseous fragments are seen adjacent to the posterior glenoid in appear corticated suggesting chronicity. Acromioclavicular and coracoclavicular distances are preserved. IMPRESSION: 1. Persistent anterior dislocation of the right humeral head. 2. Osseous fragments adjacent to the posterior glenoid, likely chronic. Electronically Signed   By: Misty Stanley M.D.   On: 10/17/2021 06:00   CT HEAD WO CONTRAST (5MM)  Result Date: 10/17/2021 CLINICAL DATA:  Head trauma, minor. Altered mental status with hallucination after Ativan. EXAM: CT HEAD WITHOUT CONTRAST TECHNIQUE: Contiguous axial images were obtained from the base of the skull through the vertex without intravenous contrast. RADIATION DOSE REDUCTION: This exam was performed according to the departmental dose-optimization program which includes automated exposure control, adjustment of the mA and/or kV according to patient size and/or use of iterative reconstruction technique. COMPARISON:  Two days ago FINDINGS: Brain: No evidence of acute infarction, hemorrhage, hydrocephalus, extra-axial collection or mass lesion/mass effect. Cerebral volume loss and chronic small vessel ischemia in the deep white matter, mild for age. Vascular: No hyperdense vessel or unexpected calcification. Skull:  Normal. Negative for fracture or focal lesion. Sinuses/Orbits: No acute finding. IMPRESSION: Aging brain without acute or interval finding. Electronically Signed   By: Jorje Guild M.D.   On: 10/17/2021 03:59   DG Shoulder Right  Result Date: 10/17/2021 CLINICAL DATA:  Fall from bed. EXAM: RIGHT SHOULDER - 2+ VIEW COMPARISON:  Two days ago FINDINGS: Anterior glenohumeral dislocation. Bone fragments at the posterior glenoid appear corticated and are favored chronic/degenerative. Suspect these were superimposed on the humeral head on prior frontal radiograph as well. These will be re-evaluated at follow-up. Degenerative spurring at the glenohumeral and acromioclavicular joints. Generalized osteopenia. IMPRESSION: 1. Anterior glenohumeral dislocation. 2. Bone fragments at the posterior glenoid appear corticated and are favored chronic/degenerative. Electronically Signed   By: Jorje Guild M.D.   On: 10/17/2021 03:57   DG Shoulder Right  Result Date: 10/15/2021 CLINICAL DATA:  Concern of dislocation. EXAM: RIGHT SHOULDER - 2+ VIEW COMPARISON:  None Available. FINDINGS: There is diffuse decreased bone mineralization. On transscapular Y-view, the posterior aspect of the humeral head contacts the  glenoid fossa however may be mildly anteriorly subluxed. On frontal view there is more overlap of the humeral head and glenoid fossa than expected. On the final image there is even more overlap prominent and findings are suspicious for anteromedial subluxation of the humeral head with respect to the glenoid fossa. The humeral head is high-riding and nearly contacts the undersurface of the acromion. Mild acromioclavicular joint space narrowing and peripheral osteophytosis. Moderate glenohumeral joint space narrowing and inferior humeral head-neck junction degenerative osteophytosis. No acute fracture is seen. IMPRESSION: 1. Positioning is suspicious for an anterior subluxation of the humeral head with respect to the  glenoid fossa. No complete dislocation is visualized. 2. High-riding humeral head suggesting a superior full-thickness rotator cuff tear. 3. 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 Electronically Signed   By: Yvonne Kendall M.D.   On: 10/15/2021 21:31   CT Head Wo Contrast  Result Date: 10/15/2021 CLINICAL DATA:  Mental status change, unknown cause EXAM: CT HEAD WITHOUT CONTRAST TECHNIQUE: Contiguous axial images were obtained from the base of the skull through the vertex without intravenous contrast. RADIATION DOSE REDUCTION: This exam was performed according to the departmental dose-optimization program which includes automated exposure control, adjustment of the mA and/or kV according to patient size and/or use of iterative reconstruction technique. COMPARISON:  None Available. BRAIN: BRAIN Patchy and confluent areas of decreased attenuation are noted throughout the deep and periventricular white matter of the cerebral hemispheres bilaterally, compatible with chronic microvascular ischemic disease. No evidence of large-territorial acute infarction. No parenchymal hemorrhage. No mass lesion. No extra-axial collection. No mass effect or midline shift. No hydrocephalus. Basilar cisterns are patent. Vascular: No hyperdense vessel. Skull: No acute fracture or focal lesion. Sinuses/Orbits: Paranasal sinuses and mastoid air cells are clear. The orbits are unremarkable. Other: None. IMPRESSION: No acute intracranial abnormality. Electronically Signed   By: Iven Finn M.D.   On: 10/15/2021 20:04   DG Chest 2 View  Result Date: 10/15/2021 CLINICAL DATA:  Altered mental status EXAM: CHEST - 2 VIEW COMPARISON:  None Available. FINDINGS: The cardiomediastinal silhouette is normal. There is no focal consolidation or pulmonary edema. There is no pleural effusion or pneumothorax. There is a 1.2 cm nodular opacity projecting over the right lower lobe. The right humeral head appears dislocated from the  glenoid. There is scoliotic curvature of the spine. IMPRESSION: 1. No radiographic evidence of acute cardiopulmonary process. 2. Suspected right shoulder dislocation. Recommend dedicated shoulder radiographs for further evaluation. 3. 1.2 cm nodular opacity projecting over the right lower lobe is favored to reflect nipple shadow. Consider repeat radiographs with nipple markers to confirm. Electronically Signed   By: Valetta Mole M.D.   On: 10/15/2021 18:28    Microbiology: Results for orders placed or performed during the hospital encounter of 10/19/21  Surgical PCR screen     Status: None   Collection Time: 10/20/21  1:23 AM   Specimen: Nasal Mucosa; Nasal Swab  Result Value Ref Range Status   MRSA, PCR NEGATIVE NEGATIVE Final   Staphylococcus aureus NEGATIVE NEGATIVE Final    Comment: (NOTE) The Xpert SA Assay (FDA approved for NASAL specimens in patients 8 years of age and older), is one component of a comprehensive surveillance program. It is not intended to diagnose infection nor to guide or monitor treatment. Performed at [facility], [place] [address], [place], [place] [address]     Labs: CBC: Recent Labs  Lab 10/19/21 2210 10/21/21 0725 10/21/21 1950 10/24/21 0406  WBC 10.4 7.7  17.2* 11.9*  HGB 12.5 12.8 13.3 11.3*  HCT 38.7 39.3 39.1 33.3*  MCV 87.0 84.7 85.0 84.1  PLT 311 319 363 387   Basic Metabolic Panel: Recent Labs  Lab 10/19/21 2210 10/20/21 0311 10/21/21 0725 10/21/21 1950 10/24/21 0406  NA 139  --  138  --  128*  K 3.1* 3.3* 4.4  --  3.7  CL 108  --  103  --  92*  CO2 24  --  27  --  29  GLUCOSE 96  --  103*  --  116*  BUN 14  --  11  --  19  CREATININE 0.68  --  0.65 0.75 0.97  CALCIUM 9.4  --  9.8  --  8.7*  MG  --  1.7  --   --  1.7   Liver Function Tests: No results for input(s): "AST", "ALT", "ALKPHOS", "BILITOT", "PROT", "ALBUMIN" in the last 168 hours. CBG: No results for input(s): "GLUCAP" in the last 168  hours.  Discharge time spent: greater than 30 minutes.  Signed: Flora Lipps, MD [facility] 10/24/2021

## 2021-10-24 NOTE — Progress Notes (Signed)
   ORTHOPAEDIC PROGRESS NOTE  s/p Procedure(s): RIGHT REVERSE TOTAL SHOULDER ARTHROPLASTY for chronic right shoulder dislocation on 10/21/21  SUBJECTIVE: She was asleep in the bed without her sling this morning. Sling was in her recliner. Replaced sling and again discussed the importance of wearing it with the patient. However with her dementia she will likely remove again. Reports moderate pain about operative site. She states she has not been using her right arm.   No chest pain. No SOB. No nausea/vomiting. No other complaints.  OBJECTIVE: PE: General: resting in hospital bed, NAD RUE: Dressing CDI. Sling replaced. Does have swelling of the right upper extremity. + Motor in  AIN, PIN, Ulnar distributions. She endorses axillary nerve sensation which she states is symmetric to contralateral side. Was able to appreciate a slight deltoid motor function but weaker compared to contralateral side. This was noted preoperatively  Sensation intact in medial, radial, and ulnar distributions. Well perfused digits.    Vitals:   10/23/21 1931 10/24/21 0342  BP: 126/83 (!) 122/53  Pulse: 92 (!) 48  Resp: 17 15  Temp: 97.8 F (36.6 C) 97.6 F (36.4 C)  SpO2: 97% 97%   Stable post-op images  ASSESSMENT: Marisa Nunez is a 76 y.o. female . POD#3  PLAN: Weightbearing: NWB RUE - okay for hand/wrist/elbow exercises  - NO passive or active ROM of the shoulder Insicional and dressing care: Reinforce dressings as needed Orthopedic device(s):  Sling  at all times! Showering: Post-op day #2 with assistance VTE prophylaxis: Resume Eliquis Pain control: PRN pain medications, minimize narcotics as able Follow - up plan: 2 weeks in office Dispo: PT/OT recommending SNF. TOC following.  Contact information:  Dr. Ophelia Charter, Noemi Chapel PA-C, After hours and holidays please check [email or web address] for group call information for [facility]  Discussed the importance of keeping her sling in place with  the patient. However with her dementia she will likely continue to remove. Please maintain shoulder sling at all times.   Cleared for discharge from orthopedics standpoint once cleared by medicine team and therapies. Discharge medication printed and placed in patient's chart.   Noemi Chapel, PA-C 10/24/2021

## 2021-10-24 NOTE — Progress Notes (Signed)
Discharge summary packet/pertinent documents provided to [facility]. Pt d/c to [facility] as ordered. Pt remains alert/orientedx2 in no apparent distress. No complaints. Pt's dtr has been informed of her picked up.

## 2021-10-24 NOTE — Progress Notes (Signed)
Report given to [facility] nurse at [facility], all questions and concerns were fully addressed.

## 2021-10-25 ENCOUNTER — Encounter (HOSPITAL_COMMUNITY): Payer: Self-pay | Admitting: Orthopaedic Surgery

## 2021-10-30 ENCOUNTER — Encounter (HOSPITAL_COMMUNITY): Payer: Self-pay | Admitting: Orthopaedic Surgery

## 2021-11-05 ENCOUNTER — Encounter: Payer: Medicare Other | Admitting: Student

## 2021-11-13 ENCOUNTER — Encounter: Payer: Medicare Other | Admitting: Student

## 2021-11-19 ENCOUNTER — Ambulatory Visit: Payer: Medicare Other

## 2021-11-20 ENCOUNTER — Ambulatory Visit (INDEPENDENT_AMBULATORY_CARE_PROVIDER_SITE_OTHER): Payer: Medicare Other

## 2021-11-20 ENCOUNTER — Other Ambulatory Visit: Payer: Self-pay

## 2021-11-20 ENCOUNTER — Ambulatory Visit (INDEPENDENT_AMBULATORY_CARE_PROVIDER_SITE_OTHER): Payer: Medicare Other | Admitting: Student

## 2021-11-20 VITALS — BP 138/105 | HR 89 | Temp 98.0°F | Ht 64.0 in | Wt 131.0 lb

## 2021-11-20 DIAGNOSIS — Z0000 Encounter for general adult medical examination without abnormal findings: Secondary | ICD-10-CM

## 2021-11-20 DIAGNOSIS — R600 Localized edema: Secondary | ICD-10-CM | POA: Diagnosis not present

## 2021-11-20 DIAGNOSIS — J449 Chronic obstructive pulmonary disease, unspecified: Secondary | ICD-10-CM

## 2021-11-20 DIAGNOSIS — Z87891 Personal history of nicotine dependence: Secondary | ICD-10-CM

## 2021-11-20 DIAGNOSIS — I480 Paroxysmal atrial fibrillation: Secondary | ICD-10-CM

## 2021-11-20 MED ORDER — FUROSEMIDE 20 MG PO TABS: 20.0000 mg | ORAL_TABLET | Freq: Every day | ORAL | 3 refills | Status: DC

## 2021-11-20 NOTE — Progress Notes (Signed)
CC: SOB/Leg Swelling  HPI:  [name] is a 76 y.o. female living with a history stated below and presents today for shortness of breath and new onset leg swelling. Please see problem based assessment and plan for additional details.  Past Medical History:  Diagnosis Date   Anxiety    Cancer ([place])    COPD (chronic obstructive pulmonary disease) ([place])    Dementia (HCC)    Depression    Paroxysmal Afib    Stroke [facility])     Current Outpatient Medications on File Prior to Visit  Medication Sig Dispense Refill   acetaminophen (TYLENOL) 500 MG tablet Take 1 tablet (500 mg total) by mouth every 6 (six) hours as needed for moderate pain, fever or mild pain. 30 tablet 0   apixaban (ELIQUIS) 5 MG TABS tablet Take 1 tablet (5 mg total) by mouth 2 (two) times daily. 120 tablet 2   aspirin EC 81 MG tablet Take 81 mg by mouth daily. Swallow whole.     atorvastatin (LIPITOR) 80 MG tablet TAKE 1 TABLET(80 MG) BY MOUTH DAILY 90 tablet 3   buPROPion (WELLBUTRIN) 75 MG tablet Take 1 tablet by mouth twice daily. 60 tablet 0   docusate sodium (COLACE) 100 MG capsule Take 1 capsule (100 mg total) by mouth 2 (two) times daily as needed for mild constipation or moderate constipation.  0   HYDROcodone-acetaminophen (NORCO) 5-325 MG tablet Take 1 tablet by mouth every 6 (six) hours as needed for severe pain. 24 tablet 0   losartan (COZAAR) 50 MG tablet Take 1 tablet (50 mg total) by mouth daily. 90 tablet 3   memantine (NAMENDA) 10 MG tablet Take 1 tablet by mouth in the morning. (Patient taking differently: Take 10 mg by mouth 2 (two) times daily.) 90 tablet 0   metoprolol succinate (TOPROL-XL) 50 MG 24 hr tablet Take 1 tablet (50 mg total) by mouth daily. Take with or immediately following a meal. 90 tablet 2   Multiple Vitamins-Minerals (MULTIVITAMIN WITH MINERALS) tablet Take 1 tablet by mouth daily.     potassium chloride (KLOR-CON) 10 MEQ tablet Take 1 tablet by mouth daily. 30 tablet 0    QUEtiapine (SEROQUEL) 25 MG tablet Take 1 tablet (25 mg total) by mouth at bedtime.     sertraline (ZOLOFT) 50 MG tablet Take 1 tablet by mouth daily. 90 tablet 0   No current facility-administered medications on file prior to visit.    No family history on file.  Social History   Socioeconomic History   Marital status: Unknown    Spouse name: Not on file   Number of children: Not on file   Years of education: Not on file   Highest education level: Not on file  Occupational History   Not on file  Tobacco Use   Smoking status: Former    Packs/day: 0.50    Types: Cigarettes   Smokeless tobacco: Not on file  Vaping Use   Vaping Use: Never used  Substance and Sexual Activity   Alcohol use: Yes    Alcohol/week: 6.0 standard drinks of alcohol    Types: 6 Cans of beer per week   Drug use: Never   Sexual activity: Yes  Other Topics Concern   Not on file  Social History Narrative   Not on file   Social Determinants of Health   Financial Resource Strain: Low Risk  (11/20/2021)   Overall Financial Resource Strain (CARDIA)    Difficulty of Paying Living Expenses:  Not hard at all  Food Insecurity: No Food Insecurity (11/20/2021)   Hunger Vital Sign    Worried About Running Out of Food in the Last Year: Never true    Ran Out of Food in the Last Year: Never true  Transportation Needs: No Transportation Needs (11/20/2021)   PRAPARE - Hydrologist (Medical): No    Lack of Transportation (Non-Medical): No  Physical Activity: Inactive (11/20/2021)   Exercise Vital Sign    Days of Exercise per Week: 0 days    Minutes of Exercise per Session: 0 min  Stress: No Stress Concern Present (11/20/2021)   [place]    Feeling of Stress : Not at all  Social Connections: Socially Isolated (11/20/2021)   Social Connection and Isolation Panel [NHANES]    Frequency of Communication with Friends and  Family: More than three times a week    Frequency of Social Gatherings with Friends and Family: Twice a week    Attends Religious Services: Never    Marine scientist or Organizations: No    Attends Archivist Meetings: Never    Marital Status: Divorced  Human resources officer Violence: Not At Risk (11/20/2021)   Humiliation, Afraid, Rape, and Kick questionnaire    Fear of Current or Ex-Partner: No    Emotionally Abused: No    Physically Abused: No    Sexually Abused: No    Review of Systems: ROS negative except for what is noted on the assessment and plan.  Vitals:   11/20/21 0952 11/20/21 1016  BP: (!) 131/101 (!) 138/105  Pulse: 84 89  Temp: 98 F (36.7 C)   TempSrc: Oral   SpO2: 98%   Weight: 131 lb (59.4 kg)   Height: '5\' 4"'$  (1.626 m)     Physical Exam: Constitutional: well-appearing woman, zoning out from time to time, and in no acute distress HENT: normocephalic atraumatic, mucous membranes moist Cardiovascular: regular rate and rhythm, no m/r/g Pulmonary/Chest: normal work of breathing on room air, lungs clear to auscultation bilaterally Neurological: alert & oriented x 3, 5/5 strength in bilateral upper and lower extremities, normal gait Skin: warm and dry, pitting edema in both lower extremities bilaterally  Assessment & Plan:   Bilateral leg edema Patient is accompanied with her daughter, who states that over this past weekend the patient's ankles were starting to swell.  She is able to ambulate normally.  She was seeing a cardiologist in [place], who stopped her Lasix. Unclear at the moment why she was on Lasix in the first place, as her blood pressure is under control.  She is currently on metoprolol, and losartan.  She has said that she has tried keeping her legs in the air, but the swelling persists.  On physical exam there is pitting edema bilaterally, and some red marks on both of her legs.  Plan:  - Due to patient's history of COPD, episodes of  shortness of breath, new onset of leg swelling, we will refer patient to cardiology as she doesn't have a cardiologist in [place].  - Also ordered an Echocardiogram for her to evaluate her EF - Reordered her Lasix, and will attempt to reach out to her doctor in [place]  COPD (chronic obstructive pulmonary disease) ([place]) [name] has stated that for the past month or so the patient has had episodes of hyperventilation and shortness of breath. She states these episodes last about 4-5 minutes, and are  remedied with empty albuterol inhaler (patient's daughter says she uses it as a placebo effect). She denies any chest pain, back pain, recent fever, nausea, or vomiting.   Plan:  Anxiety vs COPD Exacerbation - Refilled her albuterol inhaler  - Due to recent changes in living situation (has been in two facilities in the past two months and lives at home now with her daughter), these episodes could be linked to anxiety.  - Advised to follow up in a week for chest X-Ray if SOB does not resolve.   Patient seen with Dr. Kelle Darting Reily Ilic, M.D. [facility], PGY-1 Phone: [phone] Date 11/20/2021 Time 7:08 PM

## 2021-11-20 NOTE — Progress Notes (Cosign Needed Addendum)
Subjective:   Marisa Nunez is a 76 y.o. female who presents for an Initial Medicare Annual Wellness Visit. I connected with  Lara Mulch on 01/10/22 by a  IN PERSON [facility]     Patient Location: Other:  IN PERSON [facility]   Provider Location: Office/Clinic  I discussed the limitations of evaluation and management by telemedicine. The patient expressed understanding and agreed to proceed.   Review of Systems    DEFERRED TO PCP  Cardiac Risk Factors include: advanced age (>26mn, >>43women);hypertension     Objective:    Today's Vitals   11/20/21 1050  PainSc: 0-No pain   There is no height or weight on file to calculate BMI.     11/26/2021   12:00 PM 11/20/2021   10:55 AM 11/20/2021   10:05 AM 10/20/2021    1:20 AM 10/19/2021    3:03 PM 10/01/2021    8:48 AM 06/13/2021    5:00 PM  Advanced Directives  Does Patient Have a Medical Advance Directive? No Yes Yes Yes Yes Yes Yes  Type of ASocial research officer, governmentLiving will;Out of facility DNR (pink MOST or yellow form) [place]Living will [place]Living will HM[place] VernonLiving will [place] BeachLiving will [place] Does patient want to make changes to medical advance directive?   No - Patient declined No - Patient declined   No - Patient declined  Copy of HGareyin Chart?   Yes - validated most recent copy scanned in chart (See row information) Yes - validated most recent copy scanned in chart (See row information)  Yes - validated most recent copy scanned in chart (See row information)   Would patient like information on creating a medical advance directive? No - Patient declined          Current Medications (verified) Outpatient Encounter Medications as of 11/20/2021  Medication Sig   acetaminophen (TYLENOL) 500 MG tablet Take 1 tablet (500 mg total) by mouth every 6 (six) hours as needed for  moderate pain, fever or mild pain.   apixaban (ELIQUIS) 5 MG TABS tablet Take 1 tablet (5 mg total) by mouth 2 (two) times daily.   aspirin EC 81 MG tablet Take 81 mg by mouth daily. Swallow whole.   atorvastatin (LIPITOR) 80 MG tablet TAKE 1 TABLET(80 MG) BY MOUTH DAILY   buPROPion (WELLBUTRIN) 75 MG tablet Take 1 tablet by mouth twice daily.   memantine (NAMENDA) 10 MG tablet Take 1 tablet by mouth in the morning. (Patient taking differently: Take 20 mg by mouth daily.)   Multiple Vitamins-Minerals (MULTIVITAMIN WITH MINERALS) tablet Take 1 tablet by mouth daily.   potassium chloride (KLOR-CON) 10 MEQ tablet Take 1 tablet by mouth daily.   sertraline (ZOLOFT) 50 MG tablet Take 1 tablet by mouth daily.   [DISCONTINUED] docusate sodium (COLACE) 100 MG capsule Take 1 capsule (100 mg total) by mouth 2 (two) times daily as needed for mild constipation or moderate constipation. (Patient not taking: Reported on 11/26/2021)   [DISCONTINUED] HYDROcodone-acetaminophen (NORCO) 5-325 MG tablet Take 1 tablet by mouth every 6 (six) hours as needed for severe pain. (Patient not taking: Reported on 11/26/2021)   [DISCONTINUED] losartan (COZAAR) 50 MG tablet Take 1 tablet (50 mg total) by mouth daily.   [DISCONTINUED] metoprolol succinate (TOPROL-XL) 50 MG 24 hr tablet Take 1 tablet (50 mg total) by mouth daily. Take with or immediately following a  meal.   [DISCONTINUED] QUEtiapine (SEROQUEL) 25 MG tablet Take 1 tablet (25 mg total) by mouth at bedtime. (Patient not taking: Reported on 11/26/2021)   No facility-administered encounter medications on file as of 11/20/2021.    Allergies (verified) Influenza virus vaccine, Myrbetriq [mirabegron er], Pneumococcal vaccine, and Ativan [lorazepam]   History: Past Medical History:  Diagnosis Date   Anxiety    Breast cancer ([place])    Chronic combined systolic and diastolic CHF (congestive heart failure) ([place])    a. Echo 11/2021: LVEF of 40-45% with global hypokinesis,  mildly reduced RV systolic function, and moderate MR   COPD (chronic obstructive pulmonary disease) (HCC)    Dementia (HCC)    Depression    HTN (hypertension)    Paroxysmal atrial fibrillation ([place]) 2017   Stroke [facility])    Past Surgical History:  Procedure Laterality Date   ABDOMINAL HYSTERECTOMY     REVERSE SHOULDER ARTHROPLASTY Right 10/21/2021   Procedure: RIGHT REVERSE TOTAL SHOULDER ARTHROPLASTY;  Surgeon: Hiram Gash, MD;  Location: [place];  Service: Orthopedics;  Laterality: Right;  allen bed, spider, tornier (rep aware), open shoulder tray.   Family History  Problem Relation Age of Onset   Arrhythmia Father    Social History   Socioeconomic History   Marital status: Unknown    Spouse name: Not on file   Number of children: Not on file   Years of education: Not on file   Highest education level: Not on file  Occupational History   Not on file  Tobacco Use   Smoking status: Former    Packs/day: 0.50    Types: Cigarettes   Smokeless tobacco: Not on file  Vaping Use   Vaping Use: Never used  Substance and Sexual Activity   Alcohol use: Yes    Alcohol/week: 6.0 standard drinks of alcohol    Types: 6 Cans of beer per week   Drug use: Never   Sexual activity: Yes  Other Topics Concern   Not on file  Social History Narrative   Not on file   Social Determinants of Health   Financial Resource Strain: Low Risk  (11/20/2021)   Overall Financial Resource Strain (CARDIA)    Difficulty of Paying Living Expenses: Not hard at all  Food Insecurity: No Food Insecurity (11/20/2021)   Hunger Vital Sign    Worried About Running Out of Food in the Last Year: Never true    Ran Out of Food in the Last Year: Never true  Transportation Needs: No Transportation Needs (11/20/2021)   PRAPARE - Hydrologist (Medical): No    Lack of Transportation (Non-Medical): No  Physical Activity: Inactive (11/20/2021)   Exercise Vital Sign    Days of Exercise per  Week: 0 days    Minutes of Exercise per Session: 0 min  Stress: No Stress Concern Present (11/20/2021)   [place]    Feeling of Stress : Not at all  Social Connections: Socially Isolated (11/20/2021)   Social Connection and Isolation Panel [NHANES]    Frequency of Communication with Friends and Family: More than three times a week    Frequency of Social Gatherings with Friends and Family: Twice a week    Attends Religious Services: Never    Marine scientist or Organizations: No    Attends Archivist Meetings: Never    Marital Status: Divorced    Tobacco Counseling Counseling given: Not Answered  Clinical Intake:  Pre-visit preparation completed: Yes  Pain : No/denies pain Pain Score: 0-No pain     BMI - recorded: 22 Nutritional Status: BMI of 19-24  Normal Diabetes: No  How often do you need to have someone help you when you read instructions, pamphlets, or other written materials from your doctor or pharmacy?: 1 - Never What is the last grade level you completed in school?: college  Diabetic?NO  Interpreter Needed?: No  Information entered by :: Wilmon Conover   Activities of Daily Living    11/28/2021    2:03 PM 11/20/2021   10:55 AM  In your present state of health, do you have any difficulty performing the following activities:  Hearing?  0  Vision?  1  Difficulty concentrating or making decisions?  1  Walking or climbing stairs?  0  Dressing or bathing?  0  Doing errands, shopping? 1 1  Preparing Food and eating ?  N  Using the Toilet?  N  In the past six months, have you accidently leaked urine?  N  Do you have problems with loss of bowel control?  N  Managing your Medications?  Y  Managing your Finances?  Y  Housekeeping or managing your Housekeeping?  Y    Patient Care Team: [name], MD as PCP - General (Internal Medicine) Early Osmond, MD as PCP -  Cardiology (Cardiology) Darreld Mclean, PA-C as Physician Assistant (Cardiology)  Indicate any recent Medical Services you may have received from other than [facility] providers in the past year (date may be approximate).     Assessment:   This is a routine wellness examination for Marisa Nunez.  Hearing/Vision screen No results found.  Dietary issues and exercise activities discussed: Exercise limited by: None identified   Goals Addressed   None   Depression Screen    11/20/2021    5:04 PM 11/20/2021   10:53 AM 10/01/2021    8:47 AM 05/22/2021    9:27 AM 05/22/2021    9:26 AM  PHQ 2/9 Scores  PHQ - 2 Score 0 0 0 0 0  PHQ- 9 Score    4     Fall Risk    11/20/2021   10:55 AM 11/20/2021    9:57 AM 10/01/2021    8:47 AM 05/22/2021    9:26 AM 11/08/2020   11:21 AM  Fall Risk   Falls in the past year? 1 0 0 0 0  Number falls in past yr: 1 0 0 0   Injury with Fall? 1 1 0 0   Risk for fall due to :   No Fall Risks No Fall Risks   Follow up Falls evaluation completed;Falls prevention discussed Falls evaluation completed Falls evaluation completed;Falls prevention discussed Falls evaluation completed;Falls prevention discussed     FALL RISK PREVENTION PERTAINING TO THE HOME:  Any stairs in or around the home? No  If so, are there any without handrails? No  Home free of loose throw rugs in walkways, pet beds, electrical cords, etc? No  Adequate lighting in your home to reduce risk of falls? Yes   ASSISTIVE DEVICES UTILIZED TO PREVENT FALLS:  Life alert? No  Use of a cane, walker or w/c? No  Grab bars in the bathroom? No  Shower chair or bench in shower? No  Elevated toilet seat or a handicapped toilet? No   TIMED UP AND GO:  Was the test performed? No .  Length of time to ambulate 10 feet:  N/A sec.     Cognitive Function:        11/20/2021   10:56 AM  6CIT Screen  What Year? 0 points  What month? 3 points  What time? 0 points  Count back from 20 0 points  Months in  reverse 0 points  Repeat phrase 0 points  Total Score 3 points    Immunizations  There is no immunization history on file for this patient.  TDAP status: Due, Education has been provided regarding the importance of this vaccine. Advised may receive this vaccine at local pharmacy or Health Dept. Aware to provide a copy of the vaccination record if obtained from local pharmacy or Health Dept. Verbalized acceptance and understanding.  Flu Vaccine status: Up to date  Pneumococcal vaccine status: Due, Education has been provided regarding the importance of this vaccine. Advised may receive this vaccine at local pharmacy or Health Dept. Aware to provide a copy of the vaccination record if obtained from local pharmacy or Health Dept. Verbalized acceptance and understanding.  Covid-19 vaccine status: Information provided on how to obtain vaccines.   Qualifies for Shingles Vaccine? Yes   Zostavax completed No   Shingrix Completed?: No.    Education has been provided regarding the importance of this vaccine. Patient has been advised to call insurance company to determine out of pocket expense if they have not yet received this vaccine. Advised may also receive vaccine at local pharmacy or Health Dept. Verbalized acceptance and understanding.  Screening Tests Health Maintenance  Topic Date Due   COVID-19 Vaccine (1) Never done   Hepatitis C Screening  Never done   TETANUS/TDAP  Never done   Zoster Vaccines- Shingrix (1 of 2) Never done   COLONOSCOPY (Pts 45-8yr Insurance coverage will need to be confirmed)  Never done   MAMMOGRAM  Never done   Pneumonia Vaccine 74 Years old (1 - PCV) Never done   DEXA SCAN  Never done   INFLUENZA VACCINE  Never done   HPV VACCINES  Aged Out    Health Maintenance  Health Maintenance Due  Topic Date Due   COVID-19 Vaccine (1) Never done   Hepatitis C Screening  Never done   TETANUS/TDAP  Never done   Zoster Vaccines- Shingrix (1 of 2) Never done    COLONOSCOPY (Pts 45-432yrInsurance coverage will need to be confirmed)  Never done   MAMMOGRAM  Never done   Pneumonia Vaccine 6580Years old (1 - PCV) Never done   DEXA SCAN  Never done   INFLUENZA VACCINE  Never done    Colorectal cancer screening: No longer required.   Mammogram status: Completed 10/01/2021. Repeat every 2 year  Bone Density status: Ordered DEFERRED TO PCP . Pt provided with contact info and advised to call to schedule appt.  Lung Cancer Screening: (Low Dose CT Chest recommended if Age 681-80ears, 30 pack-year currently smoking OR have quit w/in 15years.) does qualify.   Lung Cancer Screening Referral: DEFERRED TO PCP   Additional Screening:  Hepatitis C Screening: does not qualify;   Vision Screening: Recommended annual ophthalmology exams for early detection of glaucoma and other disorders of the eye. Is the patient up to date with their annual eye exam?  Yes  Who is the provider or what is the name of the office in which the patient attends [facility]  If pt is not established with a provider, would they like to be referred to a provider to  establish care? No .   Dental Screening: Recommended annual dental exams for proper oral hygiene  Community Resource Referral / Chronic Care Management: CRR required this visit?  No   CCM required this visit?  No      Plan:     I have personally reviewed and noted the following in the patient's chart:   Medical and social history Use of alcohol, tobacco or illicit drugs  Current medications and supplements including opioid prescriptions. Patient is not currently taking opioid prescriptions. Functional ability and status Nutritional status Physical activity Advanced directives List of other physicians Hospitalizations, surgeries, and ER visits in previous 12 months Vitals Screenings to include cognitive, depression, and falls Referrals and appointments  In addition, I have reviewed and  discussed with patient certain preventive protocols, quality metrics, and best practice recommendations. A written personalized care plan for preventive services as well as general preventive health recommendations were provided to patient.     Judyann Munson, CMA   01/10/2022   Nurse Notes: IN PERSON [facility]    Ms. Lindstrom , Thank you for taking time to come for your Medicare Wellness Visit. I appreciate your ongoing commitment to your health goals. Please review the following plan we discussed and let me know if I can assist you in the future.   These are the goals we discussed:  Goals   None     This is a list of the screening recommended for you and due dates:  Health Maintenance  Topic Date Due   COVID-19 Vaccine (1) Never done   Hepatitis C Screening: USPSTF Recommendation to screen - Ages 16-79 yo.  Never done   Tetanus Vaccine  Never done   Zoster (Shingles) Vaccine (1 of 2) Never done   Colon Cancer Screening  Never done   Mammogram  Never done   Pneumonia Vaccine (1 - PCV) Never done   DEXA scan (bone density measurement)  Never done   Flu Shot  Never done   HPV Vaccine  Aged Out

## 2021-11-20 NOTE — Patient Instructions (Signed)
Thank you so much for coming to the clinic today, was a pleasure meeting a fellow [place]!  We talked about a few things today, hears just a quick summary.  1.  We talked about the swelling that is going on in her legs, and we decided just to be on the safe side, to do an ultrasound of the heart, and also send you to a heart doctor here in [place]. 2.  We are also going to give you some Lasix as well, to help deal with the swelling. 3.  Remember to stay away from salty foods! 4.  Would like to see you back in a week just to get some labs and touch base on how you are doing.

## 2021-11-20 NOTE — Patient Instructions (Signed)

## 2021-11-20 NOTE — Assessment & Plan Note (Signed)
[name] has stated that for the past month or so the patient has had episodes of hyperventilation and shortness of breath. She states these episodes last about 4-5 minutes, and are remedied with empty albuterol inhaler (patient's daughter says she uses it as a placebo effect). She denies any chest pain, back pain, recent fever, nausea, or vomiting.   Plan:  Anxiety vs COPD Exacerbation - Refilled her albuterol inhaler  - Due to recent changes in living situation (has been in two facilities in the past two months and lives at home now with her daughter), these episodes could be linked to anxiety.  - Advised to follow up in a week for chest X-Ray if SOB does not resolve.

## 2021-11-20 NOTE — Assessment & Plan Note (Signed)
Patient is accompanied with her daughter, who states that over this past weekend the patient's ankles were starting to swell.  She is able to ambulate normally.  She was seeing a cardiologist in [place], who stopped her Lasix. Unclear at the moment why she was on Lasix in the first place, as her blood pressure is under control.  She is currently on metoprolol, and losartan.  She has said that she has tried keeping her legs in the air, but the swelling persists.  On physical exam there is pitting edema bilaterally, and some red marks on both of her legs.  Plan:  - Due to patient's history of COPD, episodes of shortness of breath, new onset of leg swelling, we will refer patient to cardiology as she doesn't have a cardiologist in [place].  - Also ordered an Echocardiogram for her to evaluate her EF - Reordered her Lasix, and will attempt to reach out to her doctor in [place]

## 2021-11-21 NOTE — Progress Notes (Signed)
Internal Medicine Clinic Attending  I saw and evaluated the patient.  I personally confirmed the key portions of the history and exam documented by Dr. Nooruddin and I reviewed pertinent patient test results.  The assessment, diagnosis, and plan were formulated together and I agree with the documentation in the resident's note.  

## 2021-11-26 ENCOUNTER — Encounter (HOSPITAL_COMMUNITY): Payer: Self-pay | Admitting: Internal Medicine

## 2021-11-26 ENCOUNTER — Other Ambulatory Visit: Payer: Self-pay

## 2021-11-26 ENCOUNTER — Emergency Department (HOSPITAL_COMMUNITY): Payer: Medicare Other

## 2021-11-26 ENCOUNTER — Inpatient Hospital Stay (HOSPITAL_COMMUNITY)
Admission: EM | Admit: 2021-11-26 | Discharge: 2021-11-28 | DRG: 291 | Disposition: A | Discharge status: Home / Self Care | Payer: Medicare Other | Attending: Internal Medicine | Admitting: Internal Medicine

## 2021-11-26 DIAGNOSIS — Z8673 Personal history of transient ischemic attack (TIA), and cerebral infarction without residual deficits: Secondary | ICD-10-CM

## 2021-11-26 DIAGNOSIS — I110 Hypertensive heart disease with heart failure: Principal | ICD-10-CM | POA: Diagnosis present

## 2021-11-26 DIAGNOSIS — Z66 Do not resuscitate: Secondary | ICD-10-CM | POA: Diagnosis present

## 2021-11-26 DIAGNOSIS — Z7901 Long term (current) use of anticoagulants: Secondary | ICD-10-CM

## 2021-11-26 DIAGNOSIS — Z87440 Personal history of urinary (tract) infections: Secondary | ICD-10-CM

## 2021-11-26 DIAGNOSIS — E785 Hyperlipidemia, unspecified: Secondary | ICD-10-CM | POA: Diagnosis present

## 2021-11-26 DIAGNOSIS — F32A Depression, unspecified: Secondary | ICD-10-CM | POA: Diagnosis present

## 2021-11-26 DIAGNOSIS — Z887 Allergy status to serum and vaccine status: Secondary | ICD-10-CM

## 2021-11-26 DIAGNOSIS — I5033 Acute on chronic diastolic (congestive) heart failure: Secondary | ICD-10-CM | POA: Diagnosis present

## 2021-11-26 DIAGNOSIS — I509 Heart failure, unspecified: Secondary | ICD-10-CM | POA: Diagnosis not present

## 2021-11-26 DIAGNOSIS — Z7982 Long term (current) use of aspirin: Secondary | ICD-10-CM

## 2021-11-26 DIAGNOSIS — F419 Anxiety disorder, unspecified: Secondary | ICD-10-CM | POA: Diagnosis present

## 2021-11-26 DIAGNOSIS — F17210 Nicotine dependence, cigarettes, uncomplicated: Secondary | ICD-10-CM | POA: Diagnosis present

## 2021-11-26 DIAGNOSIS — Z9221 Personal history of antineoplastic chemotherapy: Secondary | ICD-10-CM

## 2021-11-26 DIAGNOSIS — J449 Chronic obstructive pulmonary disease, unspecified: Secondary | ICD-10-CM | POA: Diagnosis present

## 2021-11-26 DIAGNOSIS — Z79899 Other long term (current) drug therapy: Secondary | ICD-10-CM

## 2021-11-26 DIAGNOSIS — Z853 Personal history of malignant neoplasm of breast: Secondary | ICD-10-CM

## 2021-11-26 DIAGNOSIS — R451 Restlessness and agitation: Secondary | ICD-10-CM | POA: Diagnosis not present

## 2021-11-26 DIAGNOSIS — I5031 Acute diastolic (congestive) heart failure: Secondary | ICD-10-CM | POA: Diagnosis not present

## 2021-11-26 DIAGNOSIS — I4891 Unspecified atrial fibrillation: Secondary | ICD-10-CM

## 2021-11-26 DIAGNOSIS — Z96611 Presence of right artificial shoulder joint: Secondary | ICD-10-CM | POA: Diagnosis present

## 2021-11-26 DIAGNOSIS — Z90710 Acquired absence of both cervix and uterus: Secondary | ICD-10-CM

## 2021-11-26 DIAGNOSIS — Z888 Allergy status to other drugs, medicaments and biological substances status: Secondary | ICD-10-CM

## 2021-11-26 DIAGNOSIS — I480 Paroxysmal atrial fibrillation: Secondary | ICD-10-CM | POA: Diagnosis present

## 2021-11-26 DIAGNOSIS — Z20822 Contact with and (suspected) exposure to covid-19: Secondary | ICD-10-CM | POA: Diagnosis present

## 2021-11-26 DIAGNOSIS — F0390 Unspecified dementia without behavioral disturbance: Secondary | ICD-10-CM | POA: Diagnosis present

## 2021-11-26 DIAGNOSIS — E876 Hypokalemia: Secondary | ICD-10-CM

## 2021-11-26 HISTORY — DX: Essential (primary) hypertension: I10

## 2021-11-26 LAB — CBC WITH DIFFERENTIAL/PLATELET
Abs Immature Granulocytes: 0.03 10*3/uL (ref 0.00–0.07)
Basophils Absolute: 0 10*3/uL (ref 0.0–0.1)
Basophils Relative: 0 %
Eosinophils Absolute: 0.1 10*3/uL (ref 0.0–0.5)
Eosinophils Relative: 1 %
HCT: 35.9 % — ABNORMAL LOW (ref 36.0–46.0)
Hemoglobin: 11.2 g/dL — ABNORMAL LOW (ref 12.0–15.0)
Immature Granulocytes: 0 %
Lymphocytes Relative: 11 %
Lymphs Abs: 0.9 10*3/uL (ref 0.7–4.0)
MCH: 27.2 pg (ref 26.0–34.0)
MCHC: 31.2 g/dL (ref 30.0–36.0)
MCV: 87.1 fL (ref 80.0–100.0)
Monocytes Absolute: 0.7 10*3/uL (ref 0.1–1.0)
Monocytes Relative: 9 %
Neutro Abs: 6.6 10*3/uL (ref 1.7–7.7)
Neutrophils Relative %: 79 %
Platelets: 344 10*3/uL (ref 150–400)
RBC: 4.12 MIL/uL (ref 3.87–5.11)
RDW: 15.5 % (ref 11.5–15.5)
WBC: 8.4 10*3/uL (ref 4.0–10.5)
nRBC: 0 % (ref 0.0–0.2)

## 2021-11-26 LAB — BASIC METABOLIC PANEL
Anion gap: 10 (ref 5–15)
BUN: 7 mg/dL — ABNORMAL LOW (ref 8–23)
CO2: 29 mmol/L (ref 22–32)
Calcium: 8.7 mg/dL — ABNORMAL LOW (ref 8.9–10.3)
Chloride: 97 mmol/L — ABNORMAL LOW (ref 98–111)
Creatinine, Ser: 0.66 mg/dL (ref 0.44–1.00)
GFR, Estimated: >60 mL/min (ref >60)
Glucose, Bld: 99 mg/dL (ref 70–99)
Potassium: 2.6 mmol/L — CL (ref 3.5–5.1)
Sodium: 136 mmol/L (ref 135–145)

## 2021-11-26 LAB — BRAIN NATRIURETIC PEPTIDE: B Natriuretic Peptide: 1190.1 pg/mL — ABNORMAL HIGH (ref 0.0–100.0)

## 2021-11-26 LAB — TROPONIN I (HIGH SENSITIVITY)
Troponin I (High Sensitivity): 13 ng/L (ref <18)
Troponin I (High Sensitivity): 13 ng/L (ref <18)

## 2021-11-26 LAB — MAGNESIUM: Magnesium: 1.3 mg/dL — ABNORMAL LOW (ref 1.7–2.4)

## 2021-11-26 LAB — SARS CORONAVIRUS 2 BY RT PCR: SARS Coronavirus 2 by RT PCR: NEGATIVE

## 2021-11-26 MED ORDER — SERTRALINE HCL 50 MG PO TABS
50.0000 mg | ORAL_TABLET | Freq: Every day | ORAL | Status: DC
Start: 2021-11-27 — End: 2021-11-28
  Administered 2021-11-27 – 2021-11-28 (×2): 50 mg via ORAL
  Filled 2021-11-26 (×2): qty 1

## 2021-11-26 MED ORDER — SODIUM CHLORIDE 0.9% FLUSH: 3.0000 mL | INTRAVENOUS | Status: DC | PRN

## 2021-11-26 MED ORDER — ACETAMINOPHEN 325 MG PO TABS: 650.0000 mg | ORAL_TABLET | ORAL | Status: DC | PRN

## 2021-11-26 MED ORDER — FUROSEMIDE 10 MG/ML IJ SOLN
40.0000 mg | Freq: Once | INTRAMUSCULAR | Status: AC
  Administered 2021-11-26: 40 mg via INTRAVENOUS
  Filled 2021-11-26: qty 4

## 2021-11-26 MED ORDER — ORAL CARE MOUTH RINSE: 15.0000 mL | OROMUCOSAL | Status: DC | PRN

## 2021-11-26 MED ORDER — QUETIAPINE FUMARATE 25 MG PO TABS
25.0000 mg | ORAL_TABLET | Freq: Every day | ORAL | Status: DC
  Administered 2021-11-26 – 2021-11-27 (×2): 25 mg via ORAL
  Filled 2021-11-26 (×2): qty 1

## 2021-11-26 MED ORDER — METOPROLOL SUCCINATE ER 50 MG PO TB24
50.0000 mg | ORAL_TABLET | Freq: Every day | ORAL | Status: DC
  Administered 2021-11-27: 50 mg via ORAL
  Filled 2021-11-26: qty 1

## 2021-11-26 MED ORDER — MAGNESIUM SULFATE 2 GM/50ML IV SOLN
2.0000 g | Freq: Once | INTRAVENOUS | Status: AC
  Administered 2021-11-26: 2 g via INTRAVENOUS
  Filled 2021-11-26: qty 50

## 2021-11-26 MED ORDER — ASPIRIN 81 MG PO CHEW
81.0000 mg | CHEWABLE_TABLET | Freq: Every day | ORAL | Status: DC
  Administered 2021-11-27 – 2021-11-28 (×2): 81 mg via ORAL
  Filled 2021-11-26 (×2): qty 1

## 2021-11-26 MED ORDER — BUPROPION HCL 75 MG PO TABS
75.0000 mg | ORAL_TABLET | Freq: Two times a day (BID) | ORAL | Status: DC
  Administered 2021-11-26 – 2021-11-28 (×4): 75 mg via ORAL
  Filled 2021-11-26 (×6): qty 1

## 2021-11-26 MED ORDER — POTASSIUM CHLORIDE 10 MEQ/100ML IV SOLN
10.0000 meq | INTRAVENOUS | Status: AC
  Administered 2021-11-26 (×2): 10 meq via INTRAVENOUS
  Filled 2021-11-26 (×2): qty 100

## 2021-11-26 MED ORDER — ATORVASTATIN CALCIUM 80 MG PO TABS
80.0000 mg | ORAL_TABLET | Freq: Every day | ORAL | Status: DC
  Administered 2021-11-27 – 2021-11-28 (×2): 80 mg via ORAL
  Filled 2021-11-26 (×2): qty 1

## 2021-11-26 MED ORDER — POTASSIUM CHLORIDE CRYS ER 20 MEQ PO TBCR
40.0000 meq | EXTENDED_RELEASE_TABLET | Freq: Once | ORAL | Status: AC
  Administered 2021-11-26: 40 meq via ORAL
  Filled 2021-11-26: qty 2

## 2021-11-26 MED ORDER — SODIUM CHLORIDE 0.9 % IV SOLN: 250.0000 mL | INTRAVENOUS | Status: DC | PRN

## 2021-11-26 MED ORDER — APIXABAN 5 MG PO TABS
5.0000 mg | ORAL_TABLET | Freq: Two times a day (BID) | ORAL | Status: DC
Start: 2021-11-26 — End: 2021-11-28
  Administered 2021-11-26 – 2021-11-28 (×4): 5 mg via ORAL
  Filled 2021-11-26 (×4): qty 1

## 2021-11-26 MED ORDER — SODIUM CHLORIDE 0.9% FLUSH
3.0000 mL | Freq: Two times a day (BID) | INTRAVENOUS | Status: DC
  Administered 2021-11-26 – 2021-11-28 (×4): 3 mL via INTRAVENOUS

## 2021-11-26 MED ORDER — FUROSEMIDE 10 MG/ML IJ SOLN
20.0000 mg | Freq: Two times a day (BID) | INTRAMUSCULAR | Status: DC
  Administered 2021-11-26 – 2021-11-27 (×2): 20 mg via INTRAVENOUS
  Filled 2021-11-26 (×2): qty 2

## 2021-11-26 MED ORDER — ONDANSETRON HCL 4 MG/2ML IJ SOLN: 4.0000 mg | Freq: Four times a day (QID) | INTRAMUSCULAR | Status: DC | PRN

## 2021-11-26 NOTE — Hospital Course (Signed)
Patient is feeling well. Denies SOB or chest pain. Patient states that she is urinating normally. Discussed obtaining labs this afternoon. Discussed findings of echo. Patient is comfortable having [place] update her daughter on her condition.   Discussed starting lasix ('40mg'$  PO daily) and Losartan ('25mg'$  daily). Discussed consulting cardiology.   ----------------------------  [name] is a 76 y.o. female with PMH of dementia, CVA, acute HF, HTN, HLD, paroxysmal A-fib on Eliquis, tobacco use, and right breast cancer with chemo admitted for acute on chronic heart failure exacerbation 2/2 new EF reduction and a-fib.   Acute on Chronic Heart Failure HFrEF Pt presenting for shortness of breath for the past week. Seen at PCP on 7/26 for SOB and leg swelling and prescribed Lasix. Pt previously followed by cards in [place] and was on Lasix, pt unsure why or when she came off. Previous echo on 10/30/20 showing EF of 55-60% with mild MR and Grade I diastolic dysfunction. Pt's swelling is improved with Lasix in which tenting is noted bilaterally. Chest x-ray showed prominent central pulmonary vessels with increased interstitial markings seen in the parahilar regions and lower lung fields suggesting mild interstitial edema as well as small bilateral pleural effusions. Trop flat at 13. BNP 1,190. Euvolemic on exam currently. Transitioned to PO Lasix '40mg'$  given improvement in potassium. Repeat echo showed EF of 40 to 45%. LV mildly decreased function, global hypokinesis unable to determine diastolic parameters. RV systolic function is mildly reduced. LA moderate dilated. Moderate MVR, suspect function from LA dilation.Pt is not a candidate for SGLT2 given dementia and history of recurrent UTIs. Will start Losartan 25 mg given new reduction in EF. Metoprolol succinate increased to 75 mg. Pt would likely benefit from spirolactone if BP permits. Cards consult and will see patient on 12/09/21 for outpatient ischemia  evaluation.    Hypokalemia Pt with a history of hypokalemia. K+ 3.4 at d/c, with lowest at 2.6. Mg also low to 1.3. K+ and Mg repleted. Likely related to recent Lasix use as K+ was 3.7 on 6/27. Adding Losartan to treatment regimen and will likely help with hypokalemia.    Paroxysmal A-fib Pt brought in by EMS and found to be in A-fib with RVR at a rate of 130. Pt received 10 mg of IV diltiazem and had improvement in rate. Inpatient she remained rate controlled at a rate in the 80s. Metoprolol succinate increased to 75 mg for improved rate control. Pt to remain on Eliquis.   Chest pain Pt had some mid-sternal chest tightness that lasted for 2 hours that is reproducible on exam. New diffuse T-wave inversions from prior on 10/21/21. Troponins flat, at 13. EKG showing A-fib at a rate of 84. No ST elevations or depressions. Cards consulted and will see patient for ischemia eval.   COPD Pt with a history of COPD presenting with SOB and dry cough. 100% on RA, pt does not use oxygen at home. Continued Albuterol PRN.   HTN Pts BP was 180/102 with EMS and has improved to 188C systolic. Will continue to monitor. Metoprolol succinate increased to 75 mg for improved BP control and rate control. Will start Losartan 25 mg.   HLD Continue home Lipitor '80mg'$    Dementia Pt is oriented to person, place, and time. Unable to recall events at baseline per daughter. Pt with history of sundowning and became more confused and agitated during the nights. Pt did well with having a sitter and was kept on delirium precautions.   Right Shoulder Replacement Pt  reporting pain to R shoulder that she reports to have intermittently at baseline due to her shoulder replacement following a fall. Offered topical Voltaren, she did not use while in patient.    Follow up and Disposition Please follow up with your primary doctor on 12/02/21 for repeat BMP and medication management.   Please follow up with Cardiology on 12/09/21  given your recent hospitalization for heart failure.

## 2021-11-26 NOTE — ED Notes (Addendum)
Pt is becoming increasingly confused.  Floor nurse has been notified and sitter has been ordered.  Admitting has been notified as well.

## 2021-11-26 NOTE — ED Provider Notes (Signed)
.  Critical Care  Performed by: Wyvonnia Dusky, MD Authorized by: Wyvonnia Dusky, MD   Critical care provider statement:    Critical care time (minutes):  35   Critical care time was exclusive of:  Separately billable procedures and treating other patients   Critical care was necessary to treat or prevent imminent or life-threatening deterioration of the following conditions:  Metabolic crisis   Critical care was time spent personally by me on the following activities:  Ordering and performing treatments and interventions, ordering and review of laboratory studies, ordering and review of radiographic studies, pulse oximetry, review of old charts, examination of patient and evaluation of patient's response to treatment   Care discussed with: admitting provider   Comments:     IV electrolyte repletion for A-fib with RVR IV diuresis for CHF with tachypnea     Wyvonnia Dusky, MD 11/26/21 [phone]

## 2021-11-26 NOTE — ED Notes (Signed)
Pt successful transferred from bed to bedside commode and back again. Purewick was attempted but she could not use it.

## 2021-11-26 NOTE — Consult Note (Signed)
Cardiology Consultation:   Patient ID: Marisa Nunez MRN: [id number]; DOB: 12-15-1945  Admit date: 11/26/2021 Date of Consult: 11/26/2021  PCP:  [name], MD   [facility] Providers Cardiologist:  None      Saw Dr Leotis Pain 04/30/2018 in [place], [place]  Patient Profile:   Marisa Nunez is a 76 y.o. female with a hx of PAF, breast CA, HTN, CVA, dementia, COPD, who is being seen 11/26/2021 for the evaluation of anything about her acute diastolic heart failure at the request of Dr. Collene Gobble.  History of Present Illness:   Marisa Nunez is a 76 year old female with a history of diastolic heart failure previously followed in [place], atrial fibrillation on anticoagulation, dementia, and COPD who was admitted with acute on chronic diastolic heart failure.  Most of this information was garnered from the patient's daughter.  The patient was doing well up until about a week and a half ago.  At that point in time she was noted to have increased lower extremity edema and paroxysmal nocturnal dyspnea.  He was started on Lasix 20 mg daily.  Apparently things improved however this morning she looked gray and cyanotic per her daughter.  For this reason she was brought to the emergency department.  Here in the emergency department her vital signs were significant for elevated blood pressure.  Her proBNP was elevated at close to 1200.  Her troponin was negative.  Her other laboratories were remarkable for potassium of 2.6.  Her ED chest x-ray demonstrated small pleural effusions and mild pulmonary edema.  We are consulted for recommendations regarding acute on chronic diastolic heart failure.   Past Medical History:  Diagnosis Date   Anxiety    Cancer ([place])    COPD (chronic obstructive pulmonary disease) (HCC)    Dementia (HCC)    Depression    HTN (hypertension)    Paroxysmal Afib 2017   Stroke [facility])     Past Surgical History:  Procedure Laterality Date   ABDOMINAL HYSTERECTOMY     REVERSE  SHOULDER ARTHROPLASTY Right 10/21/2021   Procedure: RIGHT REVERSE TOTAL SHOULDER ARTHROPLASTY;  Surgeon: Hiram Gash, MD;  Location: [facility];  Service: Orthopedics;  Laterality: Right;  allen bed, spider, tornier (rep aware), open shoulder tray.     Home Medications:  Prior to Admission medications   Medication Sig Start Date End Date Taking? Authorizing Provider  acetaminophen (TYLENOL) 500 MG tablet Take 1 tablet (500 mg total) by mouth every 6 (six) hours as needed for moderate pain, fever or mild pain. 10/24/21  Yes Pokhrel, Laxman, MD  albuterol (VENTOLIN HFA) 108 (90 Base) MCG/ACT inhaler Inhale 2 puffs into the lungs every 4 (four) hours as needed for wheezing or shortness of breath.   Yes [provider]  apixaban (ELIQUIS) 5 MG TABS tablet Take 1 tablet (5 mg total) by mouth 2 (two) times daily. 06/25/21  Yes Timothy Lasso, MD  aspirin EC 81 MG tablet Take 81 mg by mouth daily. Swallow whole.   Yes [provider]  atorvastatin (LIPITOR) 80 MG tablet TAKE 1 TABLET(80 MG) BY MOUTH DAILY 06/25/21  Yes Timothy Lasso, MD  buPROPion (WELLBUTRIN) 75 MG tablet Take 1 tablet by mouth twice daily. 10/15/21  Yes Timothy Lasso, MD  furosemide (LASIX) 20 MG tablet Take 1 tablet (20 mg total) by mouth daily. 11/20/21 11/20/22 Yes Katsadouros, Vasilios, MD  losartan (COZAAR) 50 MG tablet Take 1 tablet (50 mg total) by mouth daily. 06/25/21  Yes Timothy Lasso, MD  memantine (NAMENDA) 10 MG tablet Take 1 tablet by mouth in the morning. Patient taking differently: Take 20 mg by mouth daily. 09/06/21  Yes Timothy Lasso, MD  metoprolol succinate (TOPROL-XL) 50 MG 24 hr tablet Take 1 tablet (50 mg total) by mouth daily. Take with or immediately following a meal. 06/25/21  Yes Timothy Lasso, MD  Multiple Vitamins-Minerals (MULTIVITAMIN WITH MINERALS) tablet Take 1 tablet by mouth daily.   Yes [provider]  potassium chloride (KLOR-CON) 10 MEQ tablet Take 1 tablet by mouth  daily. 10/17/21  Yes Timothy Lasso, MD  sertraline (ZOLOFT) 50 MG tablet Take 1 tablet by mouth daily. 10/17/21  Yes Timothy Lasso, MD  docusate sodium (COLACE) 100 MG capsule Take 1 capsule (100 mg total) by mouth 2 (two) times daily as needed for mild constipation or moderate constipation. Patient not taking: Reported on 11/26/2021 10/24/21   Flora Lipps, MD  HYDROcodone-acetaminophen (NORCO) 5-325 MG tablet Take 1 tablet by mouth every 6 (six) hours as needed for severe pain. Patient not taking: Reported on 11/26/2021 10/24/21   Ethelda Chick, PA-C  QUEtiapine (SEROQUEL) 25 MG tablet Take 1 tablet (25 mg total) by mouth at bedtime. Patient not taking: Reported on 11/26/2021 10/24/21   Flora Lipps, MD    Inpatient Medications: Scheduled Meds:  apixaban  5 mg Oral BID   [START ON 11/27/2021] aspirin  81 mg Oral Daily   [START ON 11/27/2021] atorvastatin  80 mg Oral Daily   buPROPion  75 mg Oral BID   [START ON 11/27/2021] metoprolol succinate  50 mg Oral Daily   QUEtiapine  25 mg Oral QHS   [START ON 11/27/2021] sertraline  50 mg Oral Daily   sodium chloride flush  3 mL Intravenous Q12H   Continuous Infusions:  sodium chloride     PRN Meds: sodium chloride, acetaminophen, ondansetron (ZOFRAN) IV, sodium chloride flush  Allergies:    Allergies  Allergen Reactions   Influenza Virus Vaccine Hives   Myrbetriq [Mirabegron Er]     Confusion, agitation, hallucination    Pneumococcal Vaccine Hives   Ativan [Lorazepam]     Makes pt agitated per daughter     Social History:   Social History   Socioeconomic History   Marital status: Unknown    Spouse name: Not on file   Number of children: Not on file   Years of education: Not on file   Highest education level: Not on file  Occupational History   Not on file  Tobacco Use   Smoking status: Former    Packs/day: 0.50    Types: Cigarettes   Smokeless tobacco: Not on file  Vaping Use   Vaping Use: Never used  Substance and  Sexual Activity   Alcohol use: Yes    Alcohol/week: 6.0 standard drinks of alcohol    Types: 6 Cans of beer per week   Drug use: Never   Sexual activity: Yes  Other Topics Concern   Not on file  Social History Narrative   Not on file   Social Determinants of Health   Financial Resource Strain: Low Risk  (11/20/2021)   Overall Financial Resource Strain (CARDIA)    Difficulty of Paying Living Expenses: Not hard at all  Food Insecurity: No Food Insecurity (11/20/2021)   Hunger Vital Sign    Worried About Running Out of Food in the Last Year: Never true    Ran Out of Food in the Last Year: Never true  Transportation Needs: No Transportation Needs (  11/20/2021)   PRAPARE - Hydrologist (Medical): No    Lack of Transportation (Non-Medical): No  Physical Activity: Inactive (11/20/2021)   Exercise Vital Sign    Days of Exercise per Week: 0 days    Minutes of Exercise per Session: 0 min  Stress: No Stress Concern Present (11/20/2021)   [place]    Feeling of Stress : Not at all  Social Connections: Socially Isolated (11/20/2021)   Social Connection and Isolation Panel [NHANES]    Frequency of Communication with Friends and Family: More than three times a week    Frequency of Social Gatherings with Friends and Family: Twice a week    Attends Religious Services: Never    Marine scientist or Organizations: No    Attends Archivist Meetings: Never    Marital Status: Divorced  Human resources officer Violence: Not At Risk (11/20/2021)   Humiliation, Afraid, Rape, and Kick questionnaire    Fear of Current or Ex-Partner: No    Emotionally Abused: No    Physically Abused: No    Sexually Abused: No    Family History:    Family History  Problem Relation Age of Onset   Arrhythmia Father      ROS:  Please see the history of present illness.   All other ROS reviewed and negative.      Physical Exam/Data:   Vitals:   11/26/21 1400 11/26/21 1631 11/26/21 1700 11/26/21 1836  BP: (!) 155/103  (!) 145/84   Pulse: 85     Resp: (!) 21  (!) 21   Temp:  98.2 F (36.8 C)    TempSrc:  Oral    SpO2: 96%     Weight:    54.1 kg  Height:    '5\' 4"'$  (1.626 m)    Intake/Output Summary (Last 24 hours) at 11/26/2021 1837 Last data filed at 11/26/2021 1715 Gross per 24 hour  Intake --  Output 1025 ml  Net -1025 ml      11/26/2021    6:36 PM 11/20/2021    9:52 AM 10/21/2021   11:32 AM  Last 3 Weights  Weight (lbs) 119 lb 4.8 oz 131 lb 120 lb  Weight (kg) 54.114 kg 59.421 kg 54.432 kg     Body mass index is 20.48 kg/m.  General:  Well nourished, well developed, in no acute distress HEENT: normal Neck: Elevated JVD Vascular: No carotid bruits; Distal pulses 2+ bilaterally Cardiac:  normal S1, S2; RRR; no murmur  Lungs: Decreased breath sounds at both bases Abd: soft, nontender, no hepatomegaly  Ext: no edema Musculoskeletal:  No deformities, BUE and BLE strength normal and equal Skin: warm and dry  Neuro:  CNs 2-12 intact, no focal abnormalities noted Psych:  Normal affect   EKG:  The EKG was personally reviewed and demonstrates: Rate controlled atrial fibrillation with nonspecific ST and T wave changes Telemetry:  Telemetry was personally reviewed and demonstrates:  atrial fib, rate controlled.  Relevant CV Studies:  ECHO: 10/30/2020  1. Left ventricular ejection fraction, by estimation, is 55 to 60%. The  left ventricle has normal function. The left ventricle has no regional  wall motion abnormalities. Left ventricular diastolic parameters are  consistent with Grade I diastolic dysfunction (impaired relaxation).   2. Right ventricular systolic function is normal. The right ventricular  size is normal.   3. The mitral valve is normal in structure. Mild mitral valve  regurgitation. No evidence of mitral stenosis.   4. The aortic valve is tricuspid. There is mild  calcification of the  aortic valve. There is mild thickening of the aortic valve. Aortic valve  regurgitation is not visualized. No aortic stenosis is present.   5. The inferior vena cava is normal in size with greater than 50%  respiratory variability, suggesting right atrial pressure of 3 mmHg.   ECHO: 04/24/2019   Left ventricle cavity appears normal.   Left ventricular wall thickness is normal.   The left ventricular EF by visual approximation is 55-60%. Normal left  ventricular systolic function.   There is grade I left ventricular diastolic dysfunction.   There is mild aortic stenosis.   There is mild mitral valve regurgitation.   There is mild tricuspid valve regurgitation.   There is no pulmonary hypertension.   There is no pericardial effusion.  Left Ventricle Left ventricle cavity appears normal. Left ventricular wall thickness is normal. The left ventricular EF by visual approximation is 55-60%. Normal left ventricular systolic function. Left ventricular wall motion is within normal limits. There is grade I left ventricular diastolic dysfunction.  Right Ventricle Right ventricle cavity appears normal. Right ventricular systolic function is normal.  Left Atrium Left atrium appears normal.  Right Atrium The right atrium appears normal.  Mitral Valve Mitral valve structure is normal. There is mild mitral valve regurgitation. There is no evidence of mitral valve stenosis.  Tricuspid Valve Tricuspid valve structure is normal. There is mild tricuspid valve regurgitation. There is no evidence of tricuspid valve stenosis. There is no pulmonary hypertension.  Aortic Valve The aortic valve is tricuspid. There is no aortic valve regurgitation. There is mild aortic stenosis.  Pulmonic Valve The pulmonic valve was not well visualized. There is no pulmonic valve regurgitation or stenosis.  Ascending Aorta The aortic root appears normal in size.  Pericardium The pericardium  appears normal. There is no pericardial effusion. LV fractional shortening 29 25 - 43 %          LVOT stroke volume 68.00 70 - 100 cm3          LV Diastolic Volume 63 67 - 993 mL          LV Systolic Volume 71.6 22 - 58 mL          IVS 0.7 0.6 - 1.0 cm          PW 0.9 0.6 - 1.0 cm          LVIDd 4.5 4.2 - 5.9 cm          LVIDs 3.2 2.1 - 4.0 cm          LA Volume Index 17.5 mL/m2          MV DT 182 <=200 msec          MV Peak E Vel 1.28 0.6 - 1.3 m/s          MV Peak A Vel 0.91 <=0.7 m/s          TDI Lateral E' 9.9 >=10 cm/s          TDI Septum E' 7.94 >=10 cm/s          TDI Lat E/e' 12.90 <=8          TDI Septum E/e' 16.10 <=8          PVein Peak S Vel 0.50 0.40 - 0.80 m/s          PVein  Peak D Vel 0.65 0.30 - 0.60 m/s          PVein A 34.90 m/s          PVein A duration 129 msec          LA size 3 3.0 - 4.0 cm          Aortic root 2.6 cm          LA volume 29.20 mL          RV-dias basal d 2.6 <=4.2 cm          Tapse 1.67 cm          Ao peak vel 1.97 <=2.5 m/s          LVOT peak vel 1.13 0.7 - 1.1 m/s          Ao VTI 45.1 cm          LVOT peak VTI 26.8 18 - 22 cm          AV mean gradient 7 <=5 mmHg          LVOT mn grad 3.0 mmHg          AV area by cont VTI 1.51 3 - 5 cm2          AV area peak vel 1.46 3 - 5 cm2          LVOT diameter 1.8 cm          LVOT area 2.54 cm2          TR peak vel 2.5 m/s          PV Vmax 0.94 0.6 - 0.9 m/s          PV AT 103.000 msec          RVID d 1.6 cm          A4C EF 68 %          A2C EF 46 %          Aortic valve mean velocity 1.23 m/s          Left atrial length anterior-posterior 4.42 cm          Left ventricular stroke volume 37.8 cc          Left ventricular length endocardial in systole 2.27 cm          Left ventricular length endocardial in diastole 3.485 cm         Laboratory Data:  High Sensitivity Troponin:   Recent Labs  Lab 11/26/21 1230  TROPONINIHS 13     Chemistry Recent Labs  Lab 11/26/21 1230  NA 136  K  2.6*  CL 97*  CO2 29  GLUCOSE 99  BUN 7*  CREATININE 0.66  CALCIUM 8.7*  MG 1.3*  GFRNONAA >60  ANIONGAP 10    No results for input(s): "PROT", "ALBUMIN", "AST", "ALT", "ALKPHOS", "BILITOT" in the last 168 hours. Lipids No results for input(s): "CHOL", "TRIG", "HDL", "LABVLDL", "LDLCALC", "CHOLHDL" in the last 168 hours.  Hematology Recent Labs  Lab 11/26/21 1230  WBC 8.4  RBC 4.12  HGB 11.2*  HCT 35.9*  MCV 87.1  MCH 27.2  MCHC 31.2  RDW 15.5  PLT 344   Thyroid No results for input(s): "TSH", "FREET4" in the last 168 hours.  BNP Recent Labs  Lab 11/26/21 1246  BNP 1,190.1*    DDimer No results for input(s): "DDIMER" in the last 168 hours.   Radiology/Studies:  DG Chest 2 View  Result Date: 11/26/2021 CLINICAL DATA:  Shortness of breath EXAM: CHEST - 2 VIEW COMPARISON:  10/15/2021 FINDINGS: Transverse diameter of heart is slightly increased. Increase in AP diameter of chest suggests COPD. Thoracic aorta is tortuous. Central pulmonary vessels are more prominent. Increased interstitial markings are seen in the parahilar regions and lower lung fields. There is blunting of both lateral CP angles. There is no pneumothorax. There is previous right shoulder arthroplasty. IMPRESSION: Central pulmonary vessels are more prominent. Increased interstitial markings are seen in the parahilar regions and lower lung fields suggesting mild interstitial edema. Small bilateral pleural effusions, more so on the right side. Electronically Signed   By: Elmer Picker M.D.   On: 11/26/2021 13:59     Assessment and Plan:   Atrial fibrillation: The patient is well rate controlled currently.  Continue metoprolol and Eliquis.  CV2 score of 3. 2.  Acute on chronic diastolic heart failure: The patient has evidence of volume overload.  We will start Lasix 20 mg IV twice daily with potassium supplementation.  She is not a candidate for SGLT2 inhibitor due to frequent UTIs in the context of  dementia.  Her troponins and EKG are reassuring.  She has no ischemic symptoms.  Keep potassium and magnesium stable 3.  Elevated blood pressure: This may be contributing to the patient's symptomatology.  We will start losartan 12.5 mg every afternoon with goal blood pressure of less than 130/80 mmHg. 4.  Dementia: Unfortunately she has issues with recurrent UTIs because of a failure to maintain cleanliness and therefore an SGLT2 inhibitor is probably not a good idea.   Risk Assessment/Risk Scores:        New York Heart Association (NYHA) Functional Class NYHA Class III  CHA2DS2-VASc Score = 3   This indicates a 3.2% annual risk of stroke. The patient's score is based upon: CHF History: 1 HTN History: 0 Diabetes History: 0 Stroke History: 0 Vascular Disease History: 0 Age Score: 1 Gender Score: 1         For questions or updates, please contact [place] Please consult [email or web address] for contact info under    Signed, Early Osmond, MD  11/26/2021 6:37 PM

## 2021-11-26 NOTE — ED Provider Notes (Signed)
[facility] EMERGENCY DEPARTMENT Provider Note   CSN: [id number] Arrival date & time: 11/26/21  1155     History  No chief complaint on file.   Marisa Nunez is a 76 y.o. female with history of A-fib and dementia who presents to the ED from home due to nausea, weakness, dyspnea on exertion, heart palpitations.  [name] is present to supplement history.  Symptoms have been ongoing for approximately 1 week.  Daughter describes general malaise.  Patient has also been experiencing increased dyspnea on exertion which prevents her from moving around the house.  She is usually able to go outside and help with gardening etc. she also had an episode of heart palpitations.  Daughter also says that she has had severe leg swelling recently for which Lasix was prescribed at her last outpatient clinic visit.  She also complains of nausea without vomiting.  This morning she was very nauseous and appeared "gray" to her daughter prompting the call to EMS.  When EMS arrived patient was in A-fib with RVR, tachycardic to the 130s.  10 mg of diltiazem were administered while in route to the hospital.  Upon arrival patient remains in A-fib with a rate in the 70s to 80s.  She denies chest pain, shortness of breath, abdominal pain.  She denies recent illness/sick contacts.   Home Medications Prior to Admission medications   Medication Sig Start Date End Date Taking? Authorizing Provider  acetaminophen (TYLENOL) 500 MG tablet Take 1 tablet (500 mg total) by mouth every 6 (six) hours as needed for moderate pain, fever or mild pain. 10/24/21   Pokhrel, Corrie Mckusick, MD  apixaban (ELIQUIS) 5 MG TABS tablet Take 1 tablet (5 mg total) by mouth 2 (two) times daily. 06/25/21   Timothy Lasso, MD  aspirin EC 81 MG tablet Take 81 mg by mouth daily. Swallow whole.    [provider]  atorvastatin (LIPITOR) 80 MG tablet TAKE 1 TABLET(80 MG) BY MOUTH DAILY 06/25/21   Timothy Lasso, MD  buPROPion  [facility]) 75 MG tablet Take 1 tablet by mouth twice daily. 10/15/21   Timothy Lasso, MD  docusate sodium (COLACE) 100 MG capsule Take 1 capsule (100 mg total) by mouth 2 (two) times daily as needed for mild constipation or moderate constipation. 10/24/21   Pokhrel, Corrie Mckusick, MD  furosemide (LASIX) 20 MG tablet Take 1 tablet (20 mg total) by mouth daily. 11/20/21 11/20/22  Riesa Pope, MD  HYDROcodone-acetaminophen (NORCO) 5-325 MG tablet Take 1 tablet by mouth every 6 (six) hours as needed for severe pain. 10/24/21   McBane, Maylene Roes, PA-C  losartan (COZAAR) 50 MG tablet Take 1 tablet (50 mg total) by mouth daily. 06/25/21   Timothy Lasso, MD  memantine (NAMENDA) 10 MG tablet Take 1 tablet by mouth in the morning. Patient taking differently: Take 10 mg by mouth 2 (two) times daily. 09/06/21   Timothy Lasso, MD  metoprolol succinate (TOPROL-XL) 50 MG 24 hr tablet Take 1 tablet (50 mg total) by mouth daily. Take with or immediately following a meal. 06/25/21   Timothy Lasso, MD  Multiple Vitamins-Minerals (MULTIVITAMIN WITH MINERALS) tablet Take 1 tablet by mouth daily.    [provider]  potassium chloride (KLOR-CON) 10 MEQ tablet Take 1 tablet by mouth daily. 10/17/21   Timothy Lasso, MD  QUEtiapine (SEROQUEL) 25 MG tablet Take 1 tablet (25 mg total) by mouth at bedtime. 10/24/21   Pokhrel, Corrie Mckusick, MD  sertraline (ZOLOFT) 50 MG tablet Take 1 tablet by  mouth daily. 10/17/21   Timothy Lasso, MD      Allergies    Influenza virus vaccine, Myrbetriq [mirabegron er], Pneumococcal vaccine, and Ativan [lorazepam]    Review of Systems   Review of Systems  All other systems reviewed and are negative.   Physical Exam Updated Vital Signs BP (!) 155/103   Pulse 85   Temp 98 F (36.7 C) (Oral)   Resp (!) 21   LMP  (LMP Unknown)   SpO2 96%  Physical Exam Vitals and nursing note reviewed.  Constitutional:      General: She is not in acute distress.    Appearance: She  is well-developed.  HENT:     Head: Normocephalic and atraumatic.  Eyes:     Conjunctiva/sclera: Conjunctivae normal.  Cardiovascular:     Rate and Rhythm: Normal rate. Rhythm irregular.     Heart sounds: No murmur heard.    Comments: No apparent JVD. Pulmonary:     Effort: Pulmonary effort is normal. No respiratory distress.     Breath sounds: Normal breath sounds. No wheezing or rales.  Abdominal:     Palpations: Abdomen is soft.     Tenderness: There is no abdominal tenderness.  Musculoskeletal:        General: No swelling.     Cervical back: Neck supple.     Comments: No lower extremity edema.  Skin:    General: Skin is warm and dry.     Capillary Refill: Capillary refill takes less than 2 seconds.  Neurological:     Mental Status: She is alert. Mental status is at baseline.  Psychiatric:        Mood and Affect: Mood normal.     ED Results / Procedures / Treatments   Labs (all labs ordered are listed, but only abnormal results are displayed) Labs Reviewed  BASIC METABOLIC PANEL - Abnormal; Notable for the following components:      Result Value   Potassium 2.6 (*)    Chloride 97 (*)    BUN 7 (*)    Calcium 8.7 (*)    All other components within normal limits  CBC WITH DIFFERENTIAL/PLATELET - Abnormal; Notable for the following components:   Hemoglobin 11.2 (*)    HCT 35.9 (*)    All other components within normal limits  MAGNESIUM - Abnormal; Notable for the following components:   Magnesium 1.3 (*)    All other components within normal limits  BRAIN NATRIURETIC PEPTIDE - Abnormal; Notable for the following components:   B Natriuretic Peptide 1,190.1 (*)    All other components within normal limits  SARS CORONAVIRUS 2 BY RT PCR  TROPONIN I (HIGH SENSITIVITY)  TROPONIN I (HIGH SENSITIVITY)    EKG EKG Interpretation  Date/Time:  Tuesday November 26 2021 12:28:16 EDT Ventricular Rate:  84 PR Interval:    QRS Duration: 87 QT Interval:  415 QTC  Calculation: 491 R Axis:   71 Text Interpretation: Atrial fibrillation Borderline repolarization abnormality Borderline prolonged QT interval Confirmed by Octaviano Glow [phone]) on 11/26/2021 12:46:59 PM  Radiology DG Chest 2 View  Result Date: 11/26/2021 CLINICAL DATA:  Shortness of breath EXAM: CHEST - 2 VIEW COMPARISON:  10/15/2021 FINDINGS: Transverse diameter of heart is slightly increased. Increase in AP diameter of chest suggests COPD. Thoracic aorta is tortuous. Central pulmonary vessels are more prominent. Increased interstitial markings are seen in the parahilar regions and lower lung fields. There is blunting of both lateral CP angles. There is no pneumothorax.  There is previous right shoulder arthroplasty. IMPRESSION: Central pulmonary vessels are more prominent. Increased interstitial markings are seen in the parahilar regions and lower lung fields suggesting mild interstitial edema. Small bilateral pleural effusions, more so on the right side. Electronically Signed   By: Elmer Picker M.D.   On: 11/26/2021 13:59    Medications Ordered in ED Medications  magnesium sulfate IVPB 2 g 50 mL (2 g Intravenous New Bag/Given 11/26/21 1435)  potassium chloride 10 mEq in 100 mL IVPB (10 mEq Intravenous New Bag/Given 11/26/21 1438)  furosemide (LASIX) injection 40 mg (40 mg Intravenous Given 11/26/21 1442)  potassium chloride SA (KLOR-CON M) CR tablet 40 mEq (40 mEq Oral Given 11/26/21 1444)    ED Course/ Medical Decision Making/ A&P Clinical Course as of 11/26/21 1520  Tue Nov 26, 2021  1245 76 yo female w/ a fib on eliquis, metoprolol, a fib is paroxysmal, here with dyspnea and nausea x 1 week, no energy.  Pt in A Fib RVR 130's, received diltiazem per EMS with HR improved. On exam HR controlled, no sig swelling of extremities.  The patient is asymptomatic in the ED.  She is not hypoxic.  Her EKG shows rate controlled A-fib, but there are diffuse T wave inversions that appear new from her prior  tracings.  It is not clear if she has never had a coronary work-up as they moved from [place] 2 years ago.  She has not had a coronary work-up here.  We are awaiting blood test, including electrolyte levels and troponin levels.  She may be experiencing symptomatic A-fib with RVR paroxysmal flareup versus atypical ACS versus other.  Currently she is asymptomatic and not requiring aspirin or nitroglycerin [MT]  3382 Basic metabolic panel(!!) [MM]  5053 Magnesium(!) [MM]  1346 Brain natriuretic peptide(!) Elevated to 1190.1 Per Care Everywhere, in 2018 BNP was in the 400s [MM]  1459 Case was discussed with IMTS, who agreed to evaluate the patient for admission for CHF exacerbation and hypokalemia. [MM]  9767 Work-up notable for hypokalemia, hypomagnesemia, BPPV also elevated.  There is some vascular congestion and pulmonary edema noted on x-ray.  The patient would benefit from IV electrolyte repletion, IV diuresis for suspected CHF in setting of uncontrolled A-fib, and also medical admission at this point for monitoring and echocardiogram. [MT]    Clinical Course User Index [MM] Nani Gasser, MD [MT] Wyvonnia Dusky, MD                           Medical Decision Making This patient presents to the ED for A-fib with RVR, this involves an extensive number of treatment options, and is a complaint that carries with it a high risk of complications and morbidity.  The differential diagnosis includes ACS, CHF exacerbation, electrolyte abnormalities  Co morbidities that complicate the patient evaluation      Dementia  Additional history obtained from outside hospital records External records from outside source obtained and reviewed including prior B-type natriuretic peptide  Lab Tests: I Ordered, and personally interpreted labs.  The pertinent results include:   BMP notable for potassium of 2.6 Magnesium: 1.3 Brain natruretic peptide: 1190.1 High-sensitivity troponins within normal  limits  Imaging Studies ordered: I ordered imaging studies including CXR I independently visualized and interpreted imaging which showed mild interstitial edema, small bilateral pleural effusions I agree with the radiologist interpretation  Cardiac Monitoring:      The patient was maintained on a cardiac  monitor.  I personally viewed and interpreted the cardiac monitored which showed an underlying rhythm of: Atrial fibrillation  Medicines ordered and prescription drug management: I ordered medication including furosemide for CHF exacerbation and volume overload Potassium chloride for hypokalemia Mag sulfate for hypomagnesemia  Internal medicine teaching service was paged to evaluate the patient for admission for CHF exacerbation and severe hypokalemia  Test Considered:      Echocardiogram, not ordered in the ED Cardiology consult, not performed in ED  Problem List / ED Course:      A-fib with RVR, resolved CHF exacerbation Severe hypokalemia  Reevaluation: After the interventions noted above, I reevaluated the patient and found that they have :stayed the same  Social Determinants of Health:      Lives at home with daughter.  Former smoker.  Dispostion:  After consideration of the diagnostic results and the patients response to treatment, I feel that the patent would benefit from admission, echocardiogram, cardiology evaluation and follow-up.              Final Clinical Impression(s) / ED Diagnoses Final diagnoses:  Congestive heart failure, unspecified HF chronicity, unspecified heart failure type (HCC)  Hypokalemia  Hypomagnesemia  Atrial fibrillation with RVR [facility])    Rx / DC Orders ED Discharge Orders     None      Nani Gasser, MD 11/26/2021, 3:20 PM     Nani Gasser, MD 11/26/21 [place]    Wyvonnia Dusky, MD 11/26/21 [phone]

## 2021-11-26 NOTE — ED Notes (Signed)
Dinner has been delivered and pt is eating

## 2021-11-26 NOTE — ED Notes (Signed)
Pt transported to xray 

## 2021-11-26 NOTE — ED Notes (Signed)
ED TO INPATIENT HANDOFF REPORT  ED Nurse Name and Phone #: Josh  S Name/Age/Gender Marisa Nunez 76 y.o. female Room/Bed: 039C/039C  Code Status   Code Status: DNR  Home/SNF/Other Home Patient oriented to: self and place Is this baseline? Yes   Triage Complete: Triage complete  Chief Complaint Acute heart failure ([place]) [I50.9]  Triage Note Pt arriving from home via [facility], pt was experiencing exertional SOB. Pt has been experiencing this for about a week. Upon EMS  Arrival pt was in AFIB RVR at a rate 130bpm.    Pt went to PCP and got dx with Paroxysmal AFIB . Pt started taking eliquis, and metoprolol 1 week ago  Pt was hypertensive, pt was not dizzy.  BP initially was 180/102, and the last BP was 140/92. Pt received '10mg'$  IV of diltiazem. Then pt pulse rate stabilized. Pt reported symptoms improved. No chest pain, n/v, diarrhea. Was placed on 2L for comfort.    Last Vital Sign  HR 76 - Afib  RR 20  SpO2 99   140/92 AO2 - baseline    Allergies Allergies  Allergen Reactions   Influenza Virus Vaccine Hives   Myrbetriq [Mirabegron Er]     Confusion, agitation, hallucination    Pneumococcal Vaccine Hives   Ativan [Lorazepam]     Makes pt agitated per daughter     Level of Care/Admitting Diagnosis ED Disposition     ED Disposition  Admit   Condition  --   Comment  Hospital Area: [facility] [[id number]]  Level of Care: Telemetry Cardiac [103]  May place patient in observation at [facility] or [facility] if equivalent level of care is available:: No  Covid Evaluation: Asymptomatic - no recent exposure (last 10 days) testing not required  Diagnosis: Acute heart failure [facility]) [[id number]]  Admitting Physician: [name] [[id number]]  Attending Physician: [name] [[id number]]          B Medical/Surgery History Past Medical History:  Diagnosis Date   Anxiety    Cancer ([place])    COPD (chronic obstructive pulmonary disease) ([place])     Dementia ([place])    Depression    Paroxysmal Afib    Stroke ([place])    Past Surgical History:  Procedure Laterality Date   ABDOMINAL HYSTERECTOMY     REVERSE SHOULDER ARTHROPLASTY Right 10/21/2021   Procedure: RIGHT REVERSE TOTAL SHOULDER ARTHROPLASTY;  Surgeon: Hiram Gash, MD;  Location: [facility];  Service: Orthopedics;  Laterality: Right;  allen bed, spider, tornier (rep aware), open shoulder tray.     A IV Location/Drains/Wounds Patient Lines/Drains/Airways Status     Active Line/Drains/Airways     Name Placement date Placement time Site Days   Peripheral IV 11/26/21 20 G Left Antecubital 11/26/21  1203  Antecubital  less than 1   Peripheral IV 11/26/21 20 G Right Antecubital 11/26/21  1426  Antecubital  less than 1   Incision (Closed) 10/21/21 Shoulder Right 10/21/21  1627  -- 36            Intake/Output Last 24 hours  Intake/Output Summary (Last 24 hours) at 11/26/2021 1720 Last data filed at 11/26/2021 1715 Gross per 24 hour  Intake --  Output 1025 ml  Net -1025 ml    Labs/Imaging Results for orders placed or performed during the hospital encounter of 11/26/21 (from the past 48 hour(s))  Basic metabolic panel     Status: Abnormal   Collection Time: 11/26/21 12:30 PM  Result Value Ref Range  Sodium 136 135 - 145 mmol/L   Potassium 2.6 (LL) 3.5 - 5.1 mmol/L    Comment: CRITICAL RESULT CALLED TO, READ BACK BY AND VERIFIED WITH FRANCES,M RN @ 1354 11/26/21 LEONARD,A   Chloride 97 (L) 98 - 111 mmol/L   CO2 29 22 - 32 mmol/L   Glucose, Bld 99 70 - 99 mg/dL    Comment: Glucose reference range applies only to samples taken after fasting for at least 8 hours.   BUN 7 (L) 8 - 23 mg/dL   Creatinine, Ser 0.66 0.44 - 1.00 mg/dL   Calcium 8.7 (L) 8.9 - 10.3 mg/dL   GFR, Estimated >60 >60 mL/min    Comment: (NOTE) Calculated using the CKD-EPI Creatinine Equation (2021)    Anion gap 10 5 - 15    Comment: Performed at [facility] [address] [place], [place]  [address]  CBC with Differential     Status: Abnormal   Collection Time: 11/26/21 12:30 PM  Result Value Ref Range   WBC 8.4 4.0 - 10.5 K/uL   RBC 4.12 3.87 - 5.11 MIL/uL   Hemoglobin 11.2 (L) 12.0 - 15.0 g/dL   HCT 35.9 (L) 36.0 - 46.0 %   MCV 87.1 80.0 - 100.0 fL   MCH 27.2 26.0 - 34.0 pg   MCHC 31.2 30.0 - 36.0 g/dL   RDW 15.5 11.5 - 15.5 %   Platelets 344 150 - 400 K/uL   nRBC 0.0 0.0 - 0.2 %   Neutrophils Relative % 79 %   Neutro Abs 6.6 1.7 - 7.7 K/uL   Lymphocytes Relative 11 %   Lymphs Abs 0.9 0.7 - 4.0 K/uL   Monocytes Relative 9 %   Monocytes Absolute 0.7 0.1 - 1.0 K/uL   Eosinophils Relative 1 %   Eosinophils Absolute 0.1 0.0 - 0.5 K/uL   Basophils Relative 0 %   Basophils Absolute 0.0 0.0 - 0.1 K/uL   Immature Granulocytes 0 %   Abs Immature Granulocytes 0.03 0.00 - 0.07 K/uL    Comment: Performed at [facility], [address] [place], [place] [address]  Magnesium     Status: Abnormal   Collection Time: 11/26/21 12:30 PM  Result Value Ref Range   Magnesium 1.3 (L) 1.7 - 2.4 mg/dL    Comment: Performed at [facility] [address], [place], [place] [address]  Troponin I (High Sensitivity)     Status: None   Collection Time: 11/26/21 12:30 PM  Result Value Ref Range   Troponin I (High Sensitivity) 13 <18 ng/L    Comment: (NOTE) Elevated high sensitivity troponin I (hsTnI) values and significant  changes across serial measurements may suggest ACS but many other  chronic and acute conditions are known to elevate hsTnI results.  Refer to the "Links" section for chest pain algorithms and additional  guidance. Performed at [facility], [place] [address], [place], [place] [address]   Brain natriuretic peptide     Status: Abnormal   Collection Time: 11/26/21 12:46 PM  Result Value Ref Range   B Natriuretic Peptide 1,190.1 (H) 0.0 - 100.0 pg/mL    Comment: Performed at [facility] [address], [place], [place] [address]   DG Chest 2  View  Result Date: 11/26/2021 CLINICAL DATA:  Shortness of breath EXAM: CHEST - 2 VIEW COMPARISON:  10/15/2021 FINDINGS: Transverse diameter of heart is slightly increased. Increase in AP diameter of chest suggests COPD. Thoracic  aorta is tortuous. Central pulmonary vessels are more prominent. Increased interstitial markings are seen in the parahilar regions and lower lung fields. There is blunting of both lateral CP angles. There is no pneumothorax. There is previous right shoulder arthroplasty. IMPRESSION: Central pulmonary vessels are more prominent. Increased interstitial markings are seen in the parahilar regions and lower lung fields suggesting mild interstitial edema. Small bilateral pleural effusions, more so on the right side. Electronically Signed   By: Elmer Picker M.D.   On: 11/26/2021 13:59    Pending Labs Unresulted Labs (From admission, onward)     Start     Ordered   11/27/21 3903  Basic metabolic panel  Daily at 5am,   R     Comments: As Scheduled for 5 days    11/26/21 1539   11/26/21 1246  SARS Coronavirus 2 by RT PCR (hospital order, performed in [facility] hospital lab) *cepheid single result test* Anterior Nasal Swab  (Tier 2 - Symptomatic/Asymptomatic)  Once,   URGENT        11/26/21 1245            Vitals/Pain Today's Vitals   11/26/21 1315 11/26/21 1330 11/26/21 1400 11/26/21 1631  BP: (!) 169/90 (!) 151/140 (!) 155/103   Pulse: 82 (!) 40 85   Resp: 17 19 (!) 21   Temp:    98.2 F (36.8 C)  TempSrc:    Oral  SpO2: 97% 93% 96%   PainSc:        Isolation Precautions No active isolations  Medications Medications  potassium chloride 10 mEq in 100 mL IVPB (10 mEq Intravenous New Bag/Given 11/26/21 1634)  sodium chloride flush (NS) 0.9 % injection 3 mL (3 mLs Intravenous Given 11/26/21 1716)  sodium chloride flush (NS) 0.9 % injection 3 mL (has no administration in time range)  0.9 %  sodium chloride infusion (has no administration in time range)   acetaminophen (TYLENOL) tablet 650 mg (has no administration in time range)  ondansetron (ZOFRAN) injection 4 mg (has no administration in time range)  apixaban (ELIQUIS) tablet 5 mg (has no administration in time range)  aspirin chewable tablet 81 mg (has no administration in time range)  atorvastatin (LIPITOR) tablet 80 mg (has no administration in time range)  buPROPion (WELLBUTRIN) tablet 75 mg (has no administration in time range)  metoprolol succinate (TOPROL-XL) 24 hr tablet 50 mg (has no administration in time range)  QUEtiapine (SEROQUEL) tablet 25 mg (has no administration in time range)  sertraline (ZOLOFT) tablet 50 mg (has no administration in time range)  magnesium sulfate IVPB 2 g 50 mL (0 g Intravenous Stopped 11/26/21 1634)  furosemide (LASIX) injection 40 mg (40 mg Intravenous Given 11/26/21 1442)  potassium chloride SA (KLOR-CON M) CR tablet 40 mEq (40 mEq Oral Given 11/26/21 1444)    Mobility walks Low fall risk   Focused Assessments Cardiac Assessment Handoff:  Cardiac Rhythm: Atrial fibrillation No results found for: "CKTOTAL", "CKMB", "CKMBINDEX", "TROPONINI" No results found for: "DDIMER" Does the Patient currently have chest pain? No    R Recommendations: See Admitting Provider Note  Report given to:   Additional Notes: Pt does have dementia and does sundown and can get agitated..  Family states she needs someone sitting with her. She has been calm and cooperative for me. Has been given Lasix.  Has put out approx 1100 so far. She uses bedside commode b/c could not use purewick.

## 2021-11-26 NOTE — H&P (Cosign Needed Addendum)
Date: 11/26/2021               Patient Name:  Marisa Nunez MRN: [id number]  DOB: 09-Feb-1946 Age / Sex: 76 y.o., female   PCP: [name], MD              Medical Service: Internal Medicine Teaching Service              Attending Physician: Dr. [name], MD    First Contact: Shirlyn Goltz, MS  Pager: [phone]  Second Contact: Dr. Claudia Desanctis Mapp Pager: [phone]  Third Contact Dr. Hadassah Pais Pager: [phone]       After Hours (After 5p/  First Contact Pager: [phone]  weekends / holidays): Second Contact Pager: [phone]   Chief Complaint: Shortness of breath  History of Present Illness: Marisa Nunez is a 76 y.o. female with PMH of dementia, HF, HTN, HLD, paroxysmal A-fib, tobacco use, and right breast cancer with chemo presenting to the ED for shortness of breath for the past week. Pt was seen by her PCP on 7/26 for SOB and new onset leg swelling and was started on Lasix with referral to [facility] for an echo. She has been unable to follow up with cardiology. She reports seeing a cardiologist in the past when she lived in [place] and was on Lasix at that time. She has been taking the Lasix since 7/26 as prescribed and endorses frequent urination with same. She reports shortness of breath on exertion with walking 10 steps. 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 She reports improvement in her leg swelling since her PCP appointment. She also reports general malaise for the past month. She reports a dry cough for the past week as well as an episode of chest tightness that is reproducible. Today, her daughter noticed the patient to be grey in color and pt reporting some nausea which promoted her to call EMS to be evaluated in the ER. She denies any orthopnea, vomiting, back pain, fever, or chills. Last BM yesterday.  Of note, much of history was provided by daughter at bedside.  Meds:  No current  facility-administered medications on file prior to encounter.   Current Outpatient Medications on File Prior to Encounter  Medication Sig Dispense Refill   acetaminophen (TYLENOL) 500 MG tablet Take 1 tablet (500 mg total) by mouth every 6 (six) hours as needed for moderate pain, fever or mild pain. 30 tablet 0   apixaban (ELIQUIS) 5 MG TABS tablet Take 1 tablet (5 mg total) by mouth 2 (two) times daily. 120 tablet 2   aspirin EC 81 MG tablet Take 81 mg by mouth daily. Swallow whole.     atorvastatin (LIPITOR) 80 MG tablet TAKE 1 TABLET(80 MG) BY MOUTH DAILY 90 tablet 3   buPROPion (WELLBUTRIN) 75 MG tablet Take 1 tablet by mouth twice daily. 60 tablet 0   docusate sodium (COLACE) 100 MG capsule Take 1 capsule (100 mg total) by mouth 2 (two) times daily as needed for mild constipation or moderate constipation.  0   furosemide (LASIX) 20 MG tablet Take 1 tablet (20 mg total) by mouth daily. 90 tablet 3   HYDROcodone-acetaminophen (NORCO) 5-325 MG tablet Take 1 tablet by mouth every 6 (six) hours as needed for severe pain. 24 tablet 0   losartan (COZAAR) 50 MG tablet Take 1 tablet (50 mg total) by mouth daily. 90 tablet 3   memantine (NAMENDA) 10  MG tablet Take 1 tablet by mouth in the morning. (Patient taking differently: Take 10 mg by mouth 2 (two) times daily.) 90 tablet 0   metoprolol succinate (TOPROL-XL) 50 MG 24 hr tablet Take 1 tablet (50 mg total) by mouth daily. Take with or immediately following a meal. 90 tablet 2   Multiple Vitamins-Minerals (MULTIVITAMIN WITH MINERALS) tablet Take 1 tablet by mouth daily.     potassium chloride (KLOR-CON) 10 MEQ tablet Take 1 tablet by mouth daily. 30 tablet 0   QUEtiapine (SEROQUEL) 25 MG tablet Take 1 tablet (25 mg total) by mouth at bedtime.     sertraline (ZOLOFT) 50 MG tablet Take 1 tablet by mouth daily. 90 tablet 0   Allergies: Reviewed with patient Allergies as of 11/26/2021 - Review Complete 11/26/2021  Allergen Reaction Noted   Influenza  virus vaccine Hives 02/03/2017   Myrbetriq [mirabegron er]  06/13/2021   Pneumococcal vaccine Hives 02/03/2017   Ativan [lorazepam]  10/17/2021   Past Medical History:  Diagnosis Date   Anxiety    Cancer ([place])    COPD (chronic obstructive pulmonary disease) ([place])    Dementia (HCC)    Depression    Paroxysmal Afib    Stroke [facility])    Surgery: Hysterectomy  Family History:  Mother deceased, PMH of alcohol abuse Father deceased, PMH of dementia Two sibling in good health, one brother and one sister Two daughters, in good health  Social History: Pt lives at home with her daughter and grandson who helps with ADLs and IADLs. 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 She has smoked a 1/2 ppd since middle school and has cut back in the last few years to only having a few cigarettes a day. She denies any alcohol or drug use.   Review of Systems: A complete ROS was negative except as per HPI.   Physical Exam: Blood pressure (!) 155/103, pulse 85, temperature 98.2 F (36.8 C), temperature source Oral, resp. rate (!) 21, SpO2 96 %.  General: Well appearing, in no acute distress HEENT: Normocephalic, atraumatic, conjunctiva normal CV: Regular rate and rhythm, no murmurs rubs or gallops. JVP to mid neck Pulm: Bibasilar crackles bilaterally with an expiratory wheeze, normal respiratory effort Abdomen: Soft, non tender. Normal BS. MSK: Moving all extremities equally. No edema w/ tenting of skin bilaterally. Distal pulses 2+ Skin: Warm and dry. No rash Neuro: Alert and oriented to person, place, and time. Unable to recall event at baseline per daughter. Psych: Mood and Affect are normal  EKG: personally reviewed my interpretation is Atrial Fibrillation at a rate of 84 with new T-wave inversions in II, III, AVF. No ST elevations or depressions.  CXR: personally reviewed my interpretation is central pulmonary vessels are more prominent. Increased interstitial markings in the  parahilar regions. Lower lung fields with mild interstitial edema. Small bilateral pleural effusions R>L. Normal heart size.  Assessment & Plan by Problem: Principal Problem:   Acute heart failure (HCC)  Marisa Nunez is a 76 y.o. female with PMH of dementia, CVA, acute HF, HTN, HLD, paroxysmal A-fib on Eliquis, tobacco use, and right breast cancer with chemo admitted for acute heart failure management.  Acute Heart Failure HFpEF Pt presenting for shortness of breath for the past week. Seen at PCP on 7/26 for SOB and leg swelling and prescribed Lasix. Pt previously followed by cards in [place] and was on Lasix, pt unsure why or when she came off. Previous echo on 10/30/20 showing  EF of 55-60% with mild MR and Grade I diastolic dysfunction. Pt's swelling is improved with Lasix in which tenting is noted bilaterally. JVP to mid-neck. CXR consistent with fluid overload will continue to diuresis. EKG showing new diffuse T wave inversions from prior on 10/21/21. Initial Trop 13, will continue to trend. BNP 1,190. Likely in acute heart failure exacerbation. - Cards consult - Strict ins/outs - Daily wt. checks - Telemetry  - awaiting Echo  - Trend Trops - Lasix 40 mg - Metoprolol succinate  Chest pain Pt had some mid-sternal chest tightness that lasted for 2 hours that is reproducible on exam. Given new diffuse T-wave inversions from prior on 10/21/21 concern for possible ischemic event. Will trend troponins, first at 13. EKG showing A-fib at a rate of 84. No ST elevations or depressions. - Cards consult - ASA - Trend trops  Paroxysmal A-fib Pt brought in by EMS and found to be in A-fib with RVR at a rate of 130. Pt received 10 mg of IV diltiazem and had improvement in rate. Currently with a rate in the 70s. - Telemetry  - Metoprolol succinate - Eliquis  Hypokalemia Pt with a history of hypokalemia. Found to be 2.6 on presentation, will replete and monitor. Mg also low at 1.3. Likely related  to recent Lasix use as K+ was 3.7 on 6/27. - PO potassium  - Mg 2g - BMP  COPD Pt with a history of COPD presenting with SOB and dry cough. Expiratory wheezes on exam. 100% on RA, pt does not use oxygen at home. Will continue to monitor - Albuterol PRN  HTN Pts BP was 180/102 with EMS and has improved to 045W systolic. Will continue to monitor.  - Continue home Metoprolol succinate '50mg'$    HLD - Continue home Lipitor '80mg'$   Dementia Pt is oriented to person, place, and time. Unable to recall events at baseline per daughter. -Delirium Precautions  Tobacco use Pt will occasionally smoke a cigarette. Will provide nicotine patch if needed.  Best Practice: VTE: Eliquis Diet: HH Code: DNR Bowel: N/a  Dispo: Admit patient to Inpatient with expected length of stay greater than 2 midnights.  Signed: Shirlyn Goltz, Medical Student 11/26/2021, 4:47 PM  Pager: [phone]   Attestation for Student Documentation:  I personally was present and performed or re-performed the history, physical exam and medical decision-making activities of this service and have verified that the service and findings are accurately documented in the student's note.  Marisa Nunez is 76yo with paroxysmal atrial fibrillation, tobacco use disorder, breast cancer s/p chemotherapy, dementia who has presented to [facility] with dyspnea on exertion, increased leg swelling for the last week concerning for acute heart failure. No previous diagnosis of such. Previous Echo a year ago w/ normal EF, grade I diastolic dysfunction. She has been given a dose of lasix in the ED, we will re-assess her volume status in the morning.   On review of patient's admission ECG, it appears she has new T-wave inversions in the inferior leads compared to previous studies. She did report some chest tightness over the last week, first troponin today 13, pending second. Given new ECG changes, recent chest pain, and new diagnosis of heart failure, we  have consulted cardiology for further recommendations.  She was also significantly hypokalemic on arrival, appears she has history of same. Besides low-dose lasix, she is not on any other causative medication. We will replete and continue to check labs.   Sanjuan Dame, MD Internal Medicine Resident PGY-3  Pager: [phone] 11/26/2021, 5:18 PM

## 2021-11-26 NOTE — ED Triage Notes (Signed)
Pt arriving from home via [facility], pt was experiencing exertional SOB. Pt has been experiencing this for about a week. Upon EMS  Arrival pt was in AFIB RVR at a rate 130bpm.    Pt went to PCP and got dx with Paroxysmal AFIB . Pt started taking eliquis, and metoprolol 1 week ago  Pt was hypertensive, pt was not dizzy.  BP initially was 180/102, and the last BP was 140/92. Pt received '10mg'$  IV of diltiazem. Then pt pulse rate stabilized. Pt reported symptoms improved. No chest pain, n/v, diarrhea. Was placed on 2L for comfort.    Last Vital Sign  HR 76 - Afib  RR 20  SpO2 99   140/92 AO2 - baseline

## 2021-11-27 ENCOUNTER — Observation Stay (HOSPITAL_COMMUNITY): Payer: Medicare Other

## 2021-11-27 DIAGNOSIS — I5033 Acute on chronic diastolic (congestive) heart failure: Secondary | ICD-10-CM | POA: Diagnosis present

## 2021-11-27 DIAGNOSIS — F17210 Nicotine dependence, cigarettes, uncomplicated: Secondary | ICD-10-CM | POA: Diagnosis present

## 2021-11-27 DIAGNOSIS — R451 Restlessness and agitation: Secondary | ICD-10-CM | POA: Diagnosis not present

## 2021-11-27 DIAGNOSIS — E876 Hypokalemia: Secondary | ICD-10-CM | POA: Diagnosis present

## 2021-11-27 DIAGNOSIS — M25511 Pain in right shoulder: Secondary | ICD-10-CM

## 2021-11-27 DIAGNOSIS — Z853 Personal history of malignant neoplasm of breast: Secondary | ICD-10-CM | POA: Diagnosis not present

## 2021-11-27 DIAGNOSIS — F419 Anxiety disorder, unspecified: Secondary | ICD-10-CM | POA: Diagnosis present

## 2021-11-27 DIAGNOSIS — I110 Hypertensive heart disease with heart failure: Principal | ICD-10-CM

## 2021-11-27 DIAGNOSIS — Z20822 Contact with and (suspected) exposure to covid-19: Secondary | ICD-10-CM | POA: Diagnosis present

## 2021-11-27 DIAGNOSIS — I480 Paroxysmal atrial fibrillation: Secondary | ICD-10-CM | POA: Diagnosis present

## 2021-11-27 DIAGNOSIS — Z7982 Long term (current) use of aspirin: Secondary | ICD-10-CM | POA: Diagnosis not present

## 2021-11-27 DIAGNOSIS — Z720 Tobacco use: Secondary | ICD-10-CM

## 2021-11-27 DIAGNOSIS — Z9221 Personal history of antineoplastic chemotherapy: Secondary | ICD-10-CM | POA: Diagnosis not present

## 2021-11-27 DIAGNOSIS — R0789 Other chest pain: Secondary | ICD-10-CM

## 2021-11-27 DIAGNOSIS — Z96611 Presence of right artificial shoulder joint: Secondary | ICD-10-CM

## 2021-11-27 DIAGNOSIS — F0390 Unspecified dementia without behavioral disturbance: Secondary | ICD-10-CM

## 2021-11-27 DIAGNOSIS — Z8673 Personal history of transient ischemic attack (TIA), and cerebral infarction without residual deficits: Secondary | ICD-10-CM | POA: Diagnosis not present

## 2021-11-27 DIAGNOSIS — I5031 Acute diastolic (congestive) heart failure: Secondary | ICD-10-CM | POA: Diagnosis not present

## 2021-11-27 DIAGNOSIS — Z79899 Other long term (current) drug therapy: Secondary | ICD-10-CM | POA: Diagnosis not present

## 2021-11-27 DIAGNOSIS — J449 Chronic obstructive pulmonary disease, unspecified: Secondary | ICD-10-CM | POA: Diagnosis present

## 2021-11-27 DIAGNOSIS — I509 Heart failure, unspecified: Secondary | ICD-10-CM | POA: Diagnosis present

## 2021-11-27 DIAGNOSIS — E785 Hyperlipidemia, unspecified: Secondary | ICD-10-CM | POA: Diagnosis present

## 2021-11-27 DIAGNOSIS — I5021 Acute systolic (congestive) heart failure: Secondary | ICD-10-CM | POA: Diagnosis not present

## 2021-11-27 DIAGNOSIS — Z888 Allergy status to other drugs, medicaments and biological substances status: Secondary | ICD-10-CM | POA: Diagnosis not present

## 2021-11-27 DIAGNOSIS — F32A Depression, unspecified: Secondary | ICD-10-CM | POA: Diagnosis present

## 2021-11-27 DIAGNOSIS — I4891 Unspecified atrial fibrillation: Secondary | ICD-10-CM

## 2021-11-27 DIAGNOSIS — Z66 Do not resuscitate: Secondary | ICD-10-CM | POA: Diagnosis present

## 2021-11-27 DIAGNOSIS — Z887 Allergy status to serum and vaccine status: Secondary | ICD-10-CM | POA: Diagnosis not present

## 2021-11-27 DIAGNOSIS — Z87440 Personal history of urinary (tract) infections: Secondary | ICD-10-CM | POA: Diagnosis not present

## 2021-11-27 DIAGNOSIS — Z7901 Long term (current) use of anticoagulants: Secondary | ICD-10-CM | POA: Diagnosis not present

## 2021-11-27 LAB — ECHOCARDIOGRAM COMPLETE
Height: 64 in
S' Lateral: 4.2 cm
Weight: 1881.6 oz

## 2021-11-27 LAB — BASIC METABOLIC PANEL
Anion gap: 9 (ref 5–15)
BUN: 10 mg/dL (ref 8–23)
CO2: 31 mmol/L (ref 22–32)
Calcium: 8.9 mg/dL (ref 8.9–10.3)
Chloride: 96 mmol/L — ABNORMAL LOW (ref 98–111)
Creatinine, Ser: 0.86 mg/dL (ref 0.44–1.00)
GFR, Estimated: >60 mL/min (ref >60)
Glucose, Bld: 110 mg/dL — ABNORMAL HIGH (ref 70–99)
Potassium: 2.9 mmol/L — ABNORMAL LOW (ref 3.5–5.1)
Sodium: 136 mmol/L (ref 135–145)

## 2021-11-27 LAB — POTASSIUM: Potassium: 3.9 mmol/L (ref 3.5–5.1)

## 2021-11-27 LAB — MAGNESIUM: Magnesium: 1.6 mg/dL — ABNORMAL LOW (ref 1.7–2.4)

## 2021-11-27 MED ORDER — HYDROXYZINE HCL 10 MG PO TABS
10.0000 mg | ORAL_TABLET | Freq: Once | ORAL | Status: AC
Start: 2021-11-27 — End: 2021-11-27
  Administered 2021-11-27: 10 mg via ORAL
  Filled 2021-11-27: qty 1

## 2021-11-27 MED ORDER — METOPROLOL SUCCINATE ER 50 MG PO TB24
75.0000 mg | ORAL_TABLET | Freq: Every day | ORAL | Status: DC
  Administered 2021-11-28: 75 mg via ORAL
  Filled 2021-11-27 (×2): qty 1

## 2021-11-27 MED ORDER — POTASSIUM CHLORIDE CRYS ER 20 MEQ PO TBCR
40.0000 meq | EXTENDED_RELEASE_TABLET | ORAL | Status: DC
Start: 2021-11-27 — End: 2021-11-27

## 2021-11-27 MED ORDER — DICLOFENAC SODIUM 1 % EX GEL
2.0000 g | Freq: Once | CUTANEOUS | Status: DC
  Filled 2021-11-27: qty 100

## 2021-11-27 MED ORDER — POTASSIUM CHLORIDE CRYS ER 20 MEQ PO TBCR
40.0000 meq | EXTENDED_RELEASE_TABLET | Freq: Two times a day (BID) | ORAL | Status: DC
Start: 2021-11-27 — End: 2021-11-27

## 2021-11-27 MED ORDER — POTASSIUM CHLORIDE CRYS ER 20 MEQ PO TBCR
40.0000 meq | EXTENDED_RELEASE_TABLET | Freq: Two times a day (BID) | ORAL | Status: DC
  Administered 2021-11-27: 40 meq via ORAL
  Filled 2021-11-27: qty 2

## 2021-11-27 MED ORDER — MAGNESIUM SULFATE 2 GM/50ML IV SOLN
2.0000 g | Freq: Once | INTRAVENOUS | Status: AC
  Administered 2021-11-27: 2 g via INTRAVENOUS
  Filled 2021-11-27: qty 50

## 2021-11-27 MED ORDER — POTASSIUM CHLORIDE CRYS ER 20 MEQ PO TBCR
40.0000 meq | EXTENDED_RELEASE_TABLET | Freq: Once | ORAL | Status: AC
  Administered 2021-11-27: 40 meq via ORAL
  Filled 2021-11-27: qty 2

## 2021-11-27 NOTE — Progress Notes (Addendum)
Subjective:  Patient is experiencing right shoulder pain that is chronic. Patient states that her chest pain and shortness of breath has resolved. She has been able to walk to the bathroom and down the hallway without feeling short of breath. Denies having a bowel movement today. Patient states that she is urinating regularly.   Nursing stated that she was denying having labs drawn today but patient agrees to having labs drawn at this time.   Objective:  Vital signs in last 24 hours: Vitals:   11/26/21 1836 11/26/21 2003 11/27/21 0141 11/27/21 0809  BP: 111/65 124/81  (!) 158/88  Pulse: 98 75    Resp: 20 20    Temp: 98 F (36.7 C) 98.6 F (37 C)  97.7 F (36.5 C)  TempSrc: Oral Oral  Oral  SpO2: 96% 95%    Weight: 54.1 kg 53.7 kg 53.3 kg   Height: '5\' 4"'$  (1.626 m)      Weight change:   Intake/Output Summary (Last 24 hours) at 11/27/2021 1333 Last data filed at 11/27/2021 0142 Gross per 24 hour  Intake 240 ml  Output 1025 ml  Net -785 ml   General: Well appearing, in no acute distress HEENT: Normocephalic, atraumatic, conjunctiva normal CV: Regular rate and rhythm, no murmurs rubs or gallops. JVP to clavicle. Pulm: CTAB, normal respiratory effort Abdomen: Soft, non tender. Normal BS. MSK: Moving all extremities equally. No edema. Distal pulses 2+ Skin: Warm and dry. No rash Neuro: Alert and oriented to person, place, and time. Unable to recall event at baseline per daughter. Psych: Mood and Affect are normal  Assessment/Plan:  Principal Problem:   Acute heart failure (HCC) Active Problems:   Acute exacerbation of CHF (congestive heart failure) (HCC)   Atrial fibrillation with RVR ([place])   Hypomagnesemia   Ms.Dezaree Murtaugh is a 76 y.o. female with PMH of dementia, CVA, acute HF, HTN, HLD, paroxysmal A-fib on Eliquis, tobacco use, and right breast cancer with chemo admitted for acute heart failure management.   Acute on Chronic Heart Failure HFpEF Pt presenting for  shortness of breath for the past week. Seen at PCP on 7/26 for SOB and leg swelling and prescribed Lasix. Pt previously followed by cards in [place] and was on Lasix, pt unsure why or when she came off. Previous echo on 10/30/20 showing EF of 55-60% with mild MR and Grade I diastolic dysfunction. Pt's swelling is improved with Lasix in which tenting is noted bilaterally. JVP to mid-neck. Chest x-ray showed prominent central pulmonary vessels with increased interstitial markings seen in the parahilar regions and lower lung fields suggesting mild interstitial edema as well as small bilateral pleural effusions. Trop flat at 13. BNP 1,190. Euvolemic on exam today. Holding on nighttime Lasix due to hypokalemia. Transitioned to PO Lasix tomorrow given improvement in potassium. Pt is not a candidate for SGLT2 given dementia and history of recurrent UTIs. - Appreciate Cards recommendations - Strict ins/outs - Daily wt. checks - Telemetry  - awaiting Echo  - Lasix 40 mg BID - Metoprolol succinate increased to 75 mg    Hypokalemia Pt with a history of hypokalemia. K+ 2.9 today, will replete and monitor. Mg also low at 1.6 today. Likely related to recent Lasix use as K+ was 3.7 on 6/27. Will hold lasix when K+ below 3.0.  - PO potassium 40 BID - Mg 2g - BMP  Paroxysmal A-fib Pt brought in by EMS and found to be in A-fib with RVR at a rate of  130. Pt received 10 mg of IV diltiazem and had improvement in rate. Currently with a rate in the 80s. - Telemetry  - Metoprolol succinate increased to 75 mg  - Eliquis  Chest pain Pt had some mid-sternal chest tightness that lasted for 2 hours that is reproducible on exam. New diffuse T-wave inversions from prior on 10/21/21. Troponins flat, at 13. EKG showing A-fib at a rate of 84. No ST elevations or depressions. - Appreciate Cards recommendations   COPD Pt with a history of COPD presenting with SOB and dry cough. 100% on RA, pt does not use oxygen at home. Will  continue to monitor - Albuterol PRN   HTN Pts BP was 180/102 with EMS and has improved to 161W systolic. Will continue to monitor.  - Metoprolol succinate increased to 75 mg    HLD - Continue home Lipitor '80mg'$    Dementia Pt is oriented to person, place, and time. Unable to recall events at baseline per daughter. Pt with history of sundowning and became more confused and agitated last night as well as this morning. Pt not tolerating telemetry will try one dose of hydroxyzine. - Safety observation - Delirium Precautions - Hydroxyzine '10mg'$  once  Right Shoulder Replacement Pt reporting pain to R shoulder this morning that she reports to have intermittently at baseline due to her shoulder replacement following a fall.  - Topical Voltaren  Tobacco use Pt will occasionally smoke a cigarette. Will provide nicotine patch if needed.   Best Practice: VTE: Eliquis Diet: HH Code: DNR Bowel: N/a   Dispo: Admit patient to Inpatient with expected length of stay greater than 2 midnights.   LOS: 0 days   Shirlyn Goltz, Medical Student 11/27/2021, 1:33 PM

## 2021-11-27 NOTE — Progress Notes (Addendum)
Progress Note  Patient Name: Marisa Nunez Date of Encounter: 11/27/2021  [facility] Cardiologist: New (Dr. Margaretann Loveless) - Moved from [place] about 1.5 years ago  Subjective   No acute overnight events. Patient has pretty significant dementia per daughter but has no specific complaints this morning. She denies any shortness of breath and states her swelling has resolved with the IV Lasix. Daughter states she looks much better. She has ambulated the halls this morning without any problems. She is in rate controlled atrial fibrillation but is tolerating this well. No palpitations or lightheadedness/dizziness.  Inpatient Medications    Scheduled Meds:  apixaban  5 mg Oral BID   aspirin  81 mg Oral Daily   atorvastatin  80 mg Oral Daily   buPROPion  75 mg Oral BID   diclofenac Sodium  2 g Topical Once   metoprolol succinate  50 mg Oral Daily   QUEtiapine  25 mg Oral QHS   sertraline  50 mg Oral Daily   sodium chloride flush  3 mL Intravenous Q12H   Continuous Infusions:  sodium chloride     PRN Meds: sodium chloride, acetaminophen, ondansetron (ZOFRAN) IV, mouth rinse, sodium chloride flush   Vital Signs    Vitals:   11/26/21 1836 11/26/21 2003 11/27/21 0141 11/27/21 0809  BP: 111/65 124/81  (!) 158/88  Pulse: 98 75    Resp: 20 20    Temp: 98 F (36.7 C) 98.6 F (37 C)  97.7 F (36.5 C)  TempSrc: Oral Oral  Oral  SpO2: 96% 95%    Weight: 54.1 kg 53.7 kg 53.3 kg   Height: '5\' 4"'$  (1.626 m)       Intake/Output Summary (Last 24 hours) at 11/27/2021 1048 Last data filed at 11/27/2021 0142 Gross per 24 hour  Intake 240 ml  Output 1025 ml  Net -785 ml      11/27/2021    1:41 AM 11/26/2021    8:03 PM 11/26/2021    6:36 PM  Last 3 Weights  Weight (lbs) 117 lb 9.6 oz 118 lb 6.4 oz 119 lb 4.8 oz  Weight (kg) 53.343 kg 53.706 kg 54.114 kg      Telemetry    Atrial fibrillation in the 80s to 90s. - Personally Reviewed  ECG    No new EKG tracing today. - Personally  Reviewed  Physical Exam   GEN: No acute distress.   Neck: No JVD. Cardiac: Irregularly irregular rhythm with normal rate. No murmurs, rubs, or gallops.  Respiratory: No increased work of breathing. Very faint crackles noted in left base but otherwise lungs clear to ausculation. GI: Soft, non-distended, and non-tender.  MS: No lower extremity edema. No deformity. Skin: Warm and dry. Neuro:  No focal deficits. Underlying dementia. Psych: Normal affect.   Labs    High Sensitivity Troponin:   Recent Labs  Lab 11/26/21 1230 11/26/21 1715  TROPONINIHS 13 13     Chemistry Recent Labs  Lab 11/26/21 1230  NA 136  K 2.6*  CL 97*  CO2 29  GLUCOSE 99  BUN 7*  CREATININE 0.66  CALCIUM 8.7*  MG 1.3*  GFRNONAA >60  ANIONGAP 10    Lipids No results for input(s): "CHOL", "TRIG", "HDL", "LABVLDL", "LDLCALC", "CHOLHDL" in the last 168 hours.  Hematology Recent Labs  Lab 11/26/21 1230  WBC 8.4  RBC 4.12  HGB 11.2*  HCT 35.9*  MCV 87.1  MCH 27.2  MCHC 31.2  RDW 15.5  PLT 344   Thyroid  No results for input(s): "TSH", "FREET4" in the last 168 hours.  BNP Recent Labs  Lab 11/26/21 1246  BNP 1,190.1*    DDimer No results for input(s): "DDIMER" in the last 168 hours.   Radiology    DG Chest 2 View  Result Date: 11/26/2021 CLINICAL DATA:  Shortness of breath EXAM: CHEST - 2 VIEW COMPARISON:  10/15/2021 FINDINGS: Transverse diameter of heart is slightly increased. Increase in AP diameter of chest suggests COPD. Thoracic aorta is tortuous. Central pulmonary vessels are more prominent. Increased interstitial markings are seen in the parahilar regions and lower lung fields. There is blunting of both lateral CP angles. There is no pneumothorax. There is previous right shoulder arthroplasty. IMPRESSION: Central pulmonary vessels are more prominent. Increased interstitial markings are seen in the parahilar regions and lower lung fields suggesting mild interstitial edema. Small  bilateral pleural effusions, more so on the right side. Electronically Signed   By: Elmer Picker M.D.   On: 11/26/2021 13:59    Cardiac Studies   Echo pending.  Patient Profile     76 y.o. female with a history of chronic diastolic CHF, paroxysmal atrial fibrillation on Eliquis, hypertension, prior CVA, COPD,  breast cancer, and dementia who was admitted on 11/26/2021 with acute on chronic diastolic CHF after presenting with shortness of breath and lower extremity edema.  Assessment & Plan    Acute on Chronic Diastolic CHF BNP elevated at 1,190. Chest x-ray showed prominent central pulmonary vessels with increased interstitial markings seen in the parahilar regions and lower lung fields suggesting mild interstitial edema as well as small bilateral pleural effusions. Last Echo in 10/2020 showed LVEF of 55-60% with grade 1 diastolic dysfunction. Received dose of IV Lasix '40mg'$  in the ED and then started on IV Lasix '20mg'$  twice daily. Only 785 cc of urinary output so far but I am not sure if I/O's are accurate (daughter states she "peed like a race horse" yesterday). Weight down 2 lbs from yesterday. No updated labs this morning.  - Euvolemic on exam. - Repeat Echo pending. - Would hold on additional Lasix until we can see what potassium is on repeat BMET. Suspect we can go ahead and transition back to PO Lasix.  Paroxysmal Atrial Fibrillation Currently in rate controlled atrial fibrillation. Rates in the 80s to 90s. Was in normal sinus rhythm on EKG in 09/2021. - Will increase Toprol-XL to '75mg'$  daily for additional BP and heart rate control. - Continue chronic anticoagulation with Eliquis '5mg'$  twice daily. - Possibly due to volume overload. Patient seems asymptomatic with this. I would favor continuing with rate control for now given her dementia. If she is still in atrial fibrillation at follow-up visit and not tolerating well, could consider outpatient DCCV.  Hypertension BP elevated at  times. - Will increase Toprol-XL to '75mg'$  daily for additonal BP and heart rate control.  Hypokalemia Hypomagnesemia Potassium 2.6 and Magnesium 1.3 on admission. Both were repleted. - Will recheck labs today.  Otherwise, per primary team: - COPD - Dementia - History of CVA  For questions or updates, please contact [facility] Please consult [email or web address] for contact info under        Signed, Darreld Mclean, PA-C  11/27/2021, 10:48 AM    Patient seen and examined with Sande Rives PA-C.  Agree as above, with the following exceptions and changes as noted below.  Patient diuresed yesterday, I's and O's likely inaccurate given volume described by her daughter.  She now has hypokalemia and  hypomagnesemia, both of which need to be promptly corrected. Gen: NAD, CV: RRR, no murmurs, Lungs: clear, Abd: soft, Extrem: Warm, well perfused, no edema, Neuro/Psych: normal mood and affect. All available labs, radiology testing, previous records reviewed.  Can likely resume oral Lasix now that she is euvolemic appearing.  Please replete both potassium and magnesium today would recommend at least potassium 40 mEq twice daily today and magnesium 2 g IV today.  Reassess tomorrow with laboratory studies and redose daily as needed.  Elouise Munroe, MD 11/27/21 1:37 PM

## 2021-11-27 NOTE — Progress Notes (Signed)
Mobility Specialist Progress Note:   11/27/21 1135  Mobility  Activity Ambulated with assistance in hallway  Level of Assistance Contact guard assist, steadying assist  Assistive Device None  Distance Ambulated (ft) 550 ft  Activity Response Tolerated well  $Mobility charge 1 Mobility   Pt received in bed willing to participate in mobility. No Complaints of pain. Left in bed with call bell in reach and all needs met.   [facility] Kipling Graser Mobility Specialist

## 2021-11-27 NOTE — Progress Notes (Signed)
Patient refusing to wear cardiac monitor. Education provided to patient. MD aware.

## 2021-11-27 NOTE — Progress Notes (Signed)
  Echocardiogram 2D Echocardiogram has been performed.  Marisa Nunez 11/27/2021, 4:02 PM

## 2021-11-28 DIAGNOSIS — J449 Chronic obstructive pulmonary disease, unspecified: Secondary | ICD-10-CM | POA: Diagnosis not present

## 2021-11-28 DIAGNOSIS — I5021 Acute systolic (congestive) heart failure: Secondary | ICD-10-CM | POA: Diagnosis not present

## 2021-11-28 DIAGNOSIS — I480 Paroxysmal atrial fibrillation: Secondary | ICD-10-CM | POA: Diagnosis not present

## 2021-11-28 DIAGNOSIS — E785 Hyperlipidemia, unspecified: Secondary | ICD-10-CM | POA: Diagnosis not present

## 2021-11-28 DIAGNOSIS — Z87891 Personal history of nicotine dependence: Secondary | ICD-10-CM

## 2021-11-28 DIAGNOSIS — I509 Heart failure, unspecified: Secondary | ICD-10-CM

## 2021-11-28 LAB — BASIC METABOLIC PANEL
Anion gap: 9 (ref 5–15)
BUN: 13 mg/dL (ref 8–23)
CO2: 28 mmol/L (ref 22–32)
Calcium: 9.1 mg/dL (ref 8.9–10.3)
Chloride: 99 mmol/L (ref 98–111)
Creatinine, Ser: 0.69 mg/dL (ref 0.44–1.00)
GFR, Estimated: >60 mL/min (ref >60)
Glucose, Bld: 154 mg/dL — ABNORMAL HIGH (ref 70–99)
Potassium: 3.4 mmol/L — ABNORMAL LOW (ref 3.5–5.1)
Sodium: 136 mmol/L (ref 135–145)

## 2021-11-28 MED ORDER — METOPROLOL SUCCINATE ER 25 MG PO TB24: 75.0000 mg | ORAL_TABLET | Freq: Every day | ORAL | 0 refills | Status: DC

## 2021-11-28 MED ORDER — LOSARTAN POTASSIUM 50 MG PO TABS: 25.0000 mg | ORAL_TABLET | Freq: Every day | ORAL | 3 refills | Status: DC

## 2021-11-28 MED ORDER — FUROSEMIDE 20 MG PO TABS: 40.0000 mg | ORAL_TABLET | Freq: Every day | ORAL | 3 refills | Status: DC

## 2021-11-28 NOTE — Progress Notes (Signed)
Mobility Specialist Progress Note:   11/28/21 0945  Mobility  Activity Ambulated with assistance in room  Level of Assistance Standby assist, set-up cues, supervision of patient - no hands on  Assistive Device None  Distance Ambulated (ft) 30 ft  Activity Response Tolerated well  $Mobility charge 1 Mobility   Pt received in hallway asking for sugar packets. Assisted pt back to room. Left EOB with call bell in reach and all needs met.   [facility] Hymen Arnett Mobility Specialist

## 2021-11-28 NOTE — Progress Notes (Signed)
Patient discharging home with daughter. Alert and oriented to self, location and time. Vital signs are within normal limits.

## 2021-11-28 NOTE — Discharge Summary (Signed)
Name: Marisa Nunez MRN: [id number] DOB: [birth year] 76 y.o. PCP: [name], MD  Date of Admission: 11/26/2021 11:55 AM Date of Discharge: No discharge date for patient encounter. Attending Physician: [name], MD  Discharge Diagnosis: 1. Principal Problem:   Acute heart failure (HCC) Active Problems:   Congestive heart failure (HCC)   Atrial fibrillation with RVR (HCC)   Hypomagnesemia Hypokalemia  Discharge Medications: Allergies as of 11/28/2021       Reactions   Influenza Virus Vaccine Hives   Myrbetriq [mirabegron Er]    Confusion, agitation, hallucination    Pneumococcal Vaccine Hives   Ativan [lorazepam]    Makes pt agitated per daughter         Medication List     STOP taking these medications    docusate sodium 100 MG capsule Commonly known as: COLACE   HYDROcodone-acetaminophen 5-325 MG tablet Commonly known as: Norco   QUEtiapine 25 MG tablet Commonly known as: SEROQUEL       TAKE these medications    acetaminophen 500 MG tablet Commonly known as: TYLENOL Take 1 tablet (500 mg total) by mouth every 6 (six) hours as needed for moderate pain, fever or mild pain.   albuterol 108 (90 Base) MCG/ACT inhaler Commonly known as: VENTOLIN HFA Inhale 2 puffs into the lungs every 4 (four) hours as needed for wheezing or shortness of breath.   apixaban 5 MG Tabs tablet Commonly known as: ELIQUIS Take 1 tablet (5 mg total) by mouth 2 (two) times daily.   aspirin EC 81 MG tablet Take 81 mg by mouth daily. Swallow whole.   atorvastatin 80 MG tablet Commonly known as: LIPITOR TAKE 1 TABLET(80 MG) BY MOUTH DAILY   buPROPion 75 MG tablet Commonly known as: WELLBUTRIN Take 1 tablet by mouth twice daily.   furosemide 20 MG tablet Commonly known as: Lasix Take 2 tablets (40 mg total) by mouth daily. What changed: how much to take   losartan 50 MG tablet Commonly known as: COZAAR Take 0.5 tablets (25 mg total) by mouth daily. What changed:  how much to take   memantine 10 MG tablet Commonly known as: NAMENDA Take 1 tablet by mouth in the morning. What changed:  how much to take when to take this   metoprolol succinate 50 MG 24 hr tablet Commonly known as: TOPROL-XL Take 1 tablet (50 mg total) by mouth daily. Take with or immediately following a meal.   multivitamin with minerals tablet Take 1 tablet by mouth daily.   potassium chloride 10 MEQ tablet Commonly known as: KLOR-CON Take 1 tablet by mouth daily.   sertraline 50 MG tablet Commonly known as: ZOLOFT Take 1 tablet by mouth daily.        Disposition and follow-up:   [name] was discharged from [facility] in Good condition.  At the hospital follow up visit please address:  1. Acute Heart Failure - Discuss new echo findings - Discuss medication changes (lasix, losartan)  2.  Labs / imaging needed at time of follow-up: BMP  3.  Pending labs/ test needing follow-up: none  Follow-up Appointments:  Follow-up Information     [name], MD. Go on 12/05/2021.   Specialty: Internal Medicine Why: '@1'$ :45pm Contact information: [address] [place] [address] [phone]         Darreld Mclean, PA-C Follow up.   Specialties: Physician Assistant, Cardiology Why: Hospital follow-up with Cardiology scheduled for 12/05/2021 at 8:50am. Please  arrive 15 minutes early for check-in. If this date/time does not work for you, please call our office to reschedule. Contact information: [address] [place] [address] [phone]                 Hospital Course by problem list: [name] is a 76 y.o. female with PMH of dementia, CVA, acute HF, HTN, HLD, paroxysmal A-fib on Eliquis, tobacco use, and right breast cancer with chemo admitted for acute on chronic heart failure exacerbation 2/2 new EF reduction and a-fib.   Acute on Chronic Heart Failure HFrEF Pt presenting for  shortness of breath for the past week. Seen at PCP on 7/26 for SOB and leg swelling and prescribed Lasix. Pt previously followed by cards in [place] and was on Lasix, pt unsure why or when she came off. Previous echo on 10/30/20 showing EF of 55-60% with mild MR and Grade I diastolic dysfunction. Pt's swelling is improved with Lasix in which tenting is noted bilaterally. Chest x-ray showed prominent central pulmonary vessels with increased interstitial markings seen in the parahilar regions and lower lung fields suggesting mild interstitial edema as well as small bilateral pleural effusions. Trop flat at 13. BNP 1,190. Euvolemic on exam currently. Transitioned to PO Lasix '40mg'$  given improvement in potassium. Repeat echo showed EF of 40 to 45%. LV mildly decreased function, global hypokinesis unable to determine diastolic parameters. RV systolic function is mildly reduced. LA moderate dilated. Moderate MVR, suspect function from LA dilation.Pt is not a candidate for SGLT2 given dementia and history of recurrent UTIs. Will start Losartan 25 mg given new reduction in EF. Metoprolol succinate increased to 75 mg. Pt would likely benefit from spirolactone if BP permits. Cards consult and will see patient on 12/09/21 for outpatient ischemia evaluation.    2. Hypokalemia Pt with a history of hypokalemia. K+ 3.4 at d/c, with lowest at 2.6. Mg also low to 1.3. K+ and Mg repleted. Likely related to recent Lasix use as K+ was 3.7 on 6/27. Adding Losartan to treatment regimen and will likely help with hypokalemia.    3. Paroxysmal A-fib Pt brought in by EMS and found to be in A-fib with RVR at a rate of 130. Pt received 10 mg of IV diltiazem and had improvement in rate. Inpatient she remained rate controlled at a rate in the 80s. Metoprolol succinate increased to 75 mg for improved rate control. Pt to remain on Eliquis.   4. Chest pain Pt had some mid-sternal chest tightness that lasted for 2 hours that is reproducible on  exam. New diffuse T-wave inversions from prior on 10/21/21. Troponins flat, at 13. EKG showing A-fib at a rate of 84. No ST elevations or depressions. Cards was consulted. To be reviewed by outpatient cardiology.   5. COPD Pt with a history of COPD presenting with SOB and dry cough. 100% on RA, pt does not use oxygen at home. Continued Albuterol PRN.   6. HTN Pts BP was 180/102 with EMS and has improved to 559R systolic. Will continue to monitor. Metoprolol succinate increased to 75 mg for improved BP control and rate control. Will start Losartan 25 mg.   7. HLD Continued home Lipitor '80mg'$ .   8. Dementia Pt is oriented to person, place, and time. Unable to recall events at baseline per daughter. Pt with history of sundowning and became more confused and agitated during the nights. Pt did well with having a sitter and was kept on delirium precautions.  9. Right Shoulder Replacement Pt reporting pain to R shoulder that she reports to have intermittently at baseline due to her shoulder replacement following a fall. Offered topical Voltaren but she did not use while inpatient.   Discharge Exam:   BP 120/74 (BP Location: Right Arm)   Pulse (!) 50   Temp 98.4 F (36.9 C) (Oral)   Resp 20   Ht '5\' 4"'$  (1.626 m)   Wt 53.9 kg   LMP  (LMP Unknown)   SpO2 99%   BMI 20.41 kg/m   General: Well appearing, in no acute distress CV: Regular rate and rhythm, no murmurs rubs or gallops. No JVD.  Pulm: CTAB, normal respiratory effort Abdomen: Soft, non tender. Normal BS. MSK: Moving all extremities equally. No edema. Distal pulses 2+ Skin: Warm and dry. No rash Neuro: Alert and oriented to person, place, and time. Unable to recall some events at baseline per daughter. Psych: Mood and Affect are normal  Pertinent Labs, Studies, and Procedures:     Latest Ref Rng & Units 11/26/2021   12:30 PM 10/24/2021    4:06 AM 10/21/2021    7:50 PM  CBC  WBC 4.0 - 10.5 K/uL 8.4  11.9  17.2   Hemoglobin 12.0  - 15.0 g/dL 11.2  11.3  13.3   Hematocrit 36.0 - 46.0 % 35.9  33.3  39.1   Platelets 150 - 400 K/uL 344  298  363        Latest Ref Rng & Units 11/28/2021   10:15 AM 11/27/2021    5:56 PM 11/27/2021   12:20 PM  BMP  Glucose 70 - 99 mg/dL 154   110   BUN 8 - 23 mg/dL 13   10   Creatinine 0.44 - 1.00 mg/dL 0.69   0.86   Sodium 135 - 145 mmol/L 136   136   Potassium 3.5 - 5.1 mmol/L 3.4  3.9  2.9   Chloride 98 - 111 mmol/L 99   96   CO2 22 - 32 mmol/L 28   31   Calcium 8.9 - 10.3 mg/dL 9.1   8.9      Discharge Instructions: Discharge Instructions     Call MD for:  difficulty breathing, headache or visual disturbances   Complete by: As directed    Call MD for:  persistant dizziness or light-headedness   Complete by: As directed    Call MD for:  persistant nausea and vomiting   Complete by: As directed    Call MD for:  severe uncontrolled pain   Complete by: As directed    Diet - low sodium heart healthy   Complete by: As directed    Increase activity slowly   Complete by: As directed      You were hospitalized for heart failure.   Hospital course: We treated your heart failure with Lasix to remove fluid from your body.  You developed low potassium while in the hospital.  We replaced your potassium with potassium supplements by mouth.   Medications:   Please continue taking: -Acetaminophen (Tylenol) 500 mg tablet (1 tablet by mouth every 6 hours as needed) for pain. -Albuterol (Ventolin HFA) 108 (90 base) MCG/ACT inhaler (inhale 2 puffs into the lungs every 4 hours as needed) for shortness of breath. -Apixaban (Eliquis) 5 mg tablet (by mouth twice daily) for your atrial fibrillation. -Aspirin EC 81 mg tablet (once by mouth daily) for your cardiovascular disease. -Atorvastatin (Lipitor) 80 mg tablet (by mouth once daily)  for your cardiovascular disease. -Bupropion (Wellbutrin) 75 mg tablet (by mouth twice daily) for your dementia. -Memantine (Namenda) 10 mg tablet (by mouth  once daily) for your dementia. -Multivitamins as needed -Potassium chloride (KLOR-CON) 10 mEq tablet for low potassium. -Sertraline (Zoloft) 50 mg tablet (by mouth once daily) for your dementia.   Please change that you are taking: -Furosemide (Lasix) 20 mg tablet (take 2 tablets (40 mg total) by mouth daily) for your heart failure. -Losartan (Cozaar) 50 mg tablet (0.5 tablets (25 mg total) by mouth daily) for high blood pressure.   Please start taking: -Metoprolol succinate (Toprol-XL) 25 MG 24 hour tablet (3 tablets by mouth daily (75 mg total)) for your high blood pressure.   Please stop taking: -Metoprolol succinate (Toprol-XL) 50 mg 24-hour tablet (1 tablet by mouth daily with a meal) for high blood pressure. -Docusate sodium (Colace) 100 mg capsule (by mouth twice daily) for constipation. -Hydrocodone-acetaminophen (Norco) 5-325 mg tablet (once by mouth every 6 hours) for pain. -Quetiapine (Seroquel) 25 mg tablet (once by mouth at bedtime) for sleep.   Follow-up: -Please follow-up with your primary care provider Dr. [name] on 12/02/21 for repeat BMP and medication management.  -Please follow up with your cardiologist on 12/09/21 to discuss your recent hospitalization for heart failure.  Signed: Starlyn Skeans, MD 11/28/2021, 1:56 PM   Pager: [phone]

## 2021-11-28 NOTE — Progress Notes (Signed)
Heart Failure Nurse Navigator Progress Note  HF TOC appt scheduled for 8/14 @ 3pm  Information provided to patient on location and clinic phone number.  Kerby Nora, PharmD, BCPS Heart Failure Stewardship Pharmacist Phone [phone]

## 2021-11-28 NOTE — TOC Initial Note (Signed)
Transition of Care [facility]) - Initial/Assessment Note    Patient Details  Name: Marisa Nunez MRN: [id number] Date of Birth: 03-09-1946  [facility] of [facility]) CM/SW Contact:    Zenon Mayo, RN Phone Number: 11/28/2021, 12:15 PM  Clinical Narrative:                 From home, indep, presents with HF, has hx of dementia, for dc today.  Daughter will transport her home today.  Meds  were sent to local pharmacy by MD.   Expected Discharge Plan: Home/Self Care Barriers to Discharge: No Barriers Identified   Patient Goals and CMS Choice Patient states their goals for this hospitalization and ongoing recovery are:: return home   Choice offered to / list presented to : NA  Expected Discharge Plan and Services Expected Discharge Plan: Home/Self Care In-house Referral: NA Discharge Planning Services: CM Consult Post Acute Care Choice: NA Living arrangements for the past 2 months: Single Family Home Expected Discharge Date: 11/28/21                 DME Agency: NA       HH Arranged: NA          Prior Living Arrangements/Services Living arrangements for the past 2 months: Single Family Home Lives with:: Adult Children Patient language and need for interpreter reviewed:: Yes Do you feel safe going back to the place where you live?: Yes      Need for Family Participation in Patient Care: Yes (Comment) Care giver support system in place?: Yes (comment)   Criminal Activity/Legal Involvement Pertinent to Current Situation/Hospitalization: No - Comment as needed  Activities of Daily Living      Permission Sought/Granted                  Emotional Assessment Appearance:: Appears stated age Attitude/Demeanor/Rapport: Engaged Affect (typically observed): Appropriate Orientation: : Oriented to Self, Oriented to Place, Oriented to  Time, Oriented to Situation Alcohol / Substance Use: Not Applicable Psych Involvement: No (comment)  Admission diagnosis:  Acute  heart failure ([place]) [I50.9] Hypokalemia [E87.6] Hypomagnesemia [E83.42] Atrial fibrillation with RVR ([place]) [I48.91] Congestive heart failure, unspecified HF chronicity, unspecified heart failure type ([place]) [I50.9] Acute exacerbation of CHF (congestive heart failure) ([place]) [I50.9] Patient Active Problem List   Diagnosis Date Noted   Congestive heart failure ([place]) 11/27/2021   Atrial fibrillation with RVR ([place])    Hypomagnesemia    Acute heart failure ([place]) 11/26/2021   Bilateral leg edema 11/20/2021   Fracture of phalanx of right foot, closed 10/21/2021   Shoulder dislocation 10/19/2021   COPD (chronic obstructive pulmonary disease) ([place]) 10/19/2021   Dementia ([place]) 10/16/2021   Bursitis of right shoulder 10/01/2021   Drug reaction    Psychosis ([place])    Acute encephalopathy 06/12/2021   Overactive bladder 05/24/2021   Vaginal lesion 05/24/2021   Hyponatremia 05/24/2021   Hypokalemia 05/24/2021   COVID-19 10/30/2020   TIA (transient ischemic attack) 10/30/2020   Cerebral infarction ([place]) 10/29/2020   History of right breast cancer 02/25/2018   History of tobacco use 05/06/2017   Mixed hyperlipidemia 05/06/2017   Osteopenia of multiple sites 05/06/2017   Essential hypertension 10/25/2015   PAF (paroxysmal atrial fibrillation) ([place]) 10/25/2015   PCP:  [name], MD Pharmacy:   [facility] [place] [place] [address] Phone: [phone] Fax: [phone]  [facility] [place] [place] [address] Phone: [phone] Fax: [phone]  [facility] [address] [place] [address] Phone: [phone] Fax: [phone]     Social Determinants of Health (SDOH) Interventions    Readmission Risk Interventions    11/28/2021   12:13 PM  Readmission Risk  Prevention Plan  Transportation Screening Complete  PCP or Specialist Appt within 3-5 Days Complete  HRI or [place] Complete  Social Work Consult for [place] Planning/Counseling Complete  Palliative Care Screening Not Applicable  Medication Review Press photographer) Complete

## 2021-11-28 NOTE — TOC Transition Note (Signed)
Transition of Care [facility]) - CM/SW Discharge Note   Patient Details  Name: Brittnae Aschenbrenner MRN: [id number] Date of Birth: [birth year]  [facility]) CM/SW Contact:  Zenon Mayo, RN Phone Number: 11/28/2021, 12:16 PM   Clinical Narrative:     From home, indep, presents with HF, has hx of dementia, for dc today.  Daughter will transport her home today.  Meds  were sent to local pharmacy by MD.    Final next level of care: Home/Self Care Barriers to Discharge: No Barriers Identified   Patient Goals and CMS Choice Patient states their goals for this hospitalization and ongoing recovery are:: return home   Choice offered to / list presented to : NA  Discharge Placement                       Discharge Plan and Services In-house Referral: NA Discharge Planning Services: CM Consult Post Acute Care Choice: NA            DME Agency: NA       HH Arranged: NA          Social Determinants of Health (SDOH) Interventions     Readmission Risk Interventions    11/28/2021   12:13 PM  Readmission Risk Prevention Plan  Transportation Screening Complete  PCP or Specialist Appt within 3-5 Days Complete  HRI or Home Care Consult Complete  Social Work Consult for [place] Planning/Counseling Complete  Palliative Care Screening Not Applicable  Medication Review Press photographer) Complete

## 2021-11-28 NOTE — Plan of Care (Signed)

## 2021-11-28 NOTE — Progress Notes (Signed)
Mobility Specialist Progress Note:   11/28/21 1123  Mobility  Activity Ambulated with assistance in hallway  Level of Assistance Standby assist, set-up cues, supervision of patient - no hands on  Assistive Device None  Distance Ambulated (ft) 550 ft  Activity Response Tolerated well  $Mobility charge 1 Mobility   Pt received sitting at sink willing to participate in mobility. No complaints of pain. Left in bed with call bell in reach and all needs met.   [facility] Tarence Searcy Mobility Specialist

## 2021-11-28 NOTE — Discharge Instructions (Addendum)
You were hospitalized for heart failure.  Hospital course: We treated your heart failure with Lasix to remove fluid from your body.  You developed low potassium while in the hospital.  We replaced your potassium with potassium supplements by mouth.  Medications:  Please continue taking: -Acetaminophen (Tylenol) 500 mg tablet (1 tablet by mouth every 6 hours as needed) for pain. -Albuterol (Ventolin HFA) 108 (90 base) MCG/ACT inhaler (inhale 2 puffs into the lungs every 4 hours as needed) for shortness of breath. -Apixaban (Eliquis) 5 mg tablet (by mouth twice daily) for your atrial fibrillation. -Aspirin EC 81 mg tablet (once by mouth daily) for your cardiovascular disease. -Atorvastatin (Lipitor) 80 mg tablet (by mouth once daily) for your cardiovascular disease. -Bupropion (Wellbutrin) 75 mg tablet (by mouth twice daily) for your dementia. -Memantine (Namenda) 10 mg tablet (by mouth once daily) for your dementia. -Multivitamins as needed -Potassium chloride (KLOR-CON) 10 mEq tablet for low potassium. -Sertraline (Zoloft) 50 mg tablet (by mouth once daily) for your dementia.  Please change that you are taking: -Furosemide (Lasix) 20 mg tablet (take 2 tablets (40 mg total) by mouth daily) for your heart failure. -Losartan (Cozaar) 50 mg tablet (0.5 tablets (25 mg total) by mouth daily) for high blood pressure.  Please start taking: -Metoprolol succinate (Toprol-XL) 25 MG 24 hour tablet (3 tablets by mouth daily (75 mg total)) for your high blood pressure.  Please stop taking: -Metoprolol succinate (Toprol-XL) 50 mg 24-hour tablet (1 tablet by mouth daily with a meal) for high blood pressure. -Docusate sodium (Colace) 100 mg capsule (by mouth twice daily) for constipation. -Hydrocodone-acetaminophen (Norco) 5-325 mg tablet (once by mouth every 6 hours) for pain. -Quetiapine (Seroquel) 25 mg tablet (once by mouth at bedtime) for sleep.  Follow-up: -Please follow-up with your primary  care provider Dr. [name] on 12/02/21 for repeat BMP and medication management.  -Please follow up with your cardiologist on 12/09/21 to discuss your recent hospitalization for heart failure.

## 2021-11-30 NOTE — Progress Notes (Signed)
Cardiology Office Note:    Date:  12/05/2021   ID:  Marisa Nunez, DOB 12/15/1945, MRN [id number]  PCP:  [name], MD  Cardiologist:  None  Electrophysiologist:  None   Referring MD: [name], MD   Chief Complaint: hospital follow-up of CHF and atrial fibrillation  History of Present Illness:    Marisa Nunez is a 76 y.o. 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 Ali Lowe and presents today for hospital follow-up of CHF.  Patient was previously followed by Dr. Roselie Awkward in [place], [place] primarily for paroxysmal atrial fibrillation. She moved to [facility] in 2022. Prior Echos in 06/2018 and 10/2020 showed LVEF of 55-60% and grade 1 diastolic dysfunction. Patient was recently admitted from 11/26/2021 to 11/28/2021 for acute CHF and atrial fibrillation with RVR after presenting with shortness of breath and lower extremity edema. Echo showed LVEF of 40-45% with global hypokinesis, mildly reduced RV systolic function, and moderate MR. She wad diuresed with IV Lasix with good urinary response and then transitioned to PO Lasix '40mg'$  daily. Home Toprol-XL was increased for better heart rate control and home and she was continued on home Losartan. She was not started on SGLT2 inhibitor due to frequent UTIs.  Patient presents today for follow-up. Here with daughter. Patient is doing well since discharge. Patient has dementia so daughter provides much of the history. Daughter reports she had some fluttering in her chest yesterday but it did not last long. Daughter states her breathing and lower extremity edema are "excellent." Weight is 121 lbs today, up from 218 lbs on discharge . However, daughter states she think she has been losing weight at home. No chest pain, shortness of breath, orthopnea, PND, lower  extremity edema, palpitations, lightheadedness, dizziness, or syncope. She is tolerating medications well. No abnormal bleeding on Eliquis and Aspirin.    Past Medical History:  Diagnosis Date   Anxiety    Breast cancer ([place])    Chronic combined systolic and diastolic CHF (congestive heart failure) ([place])    a. Echo 11/2021: LVEF of 40-45% with global hypokinesis, mildly reduced RV systolic function, and moderate MR   COPD (chronic obstructive pulmonary disease) (HCC)    Dementia (HCC)    Depression    HTN (hypertension)    Paroxysmal atrial fibrillation ([place]) 2017   Stroke [facility])     Past Surgical History:  Procedure Laterality Date   ABDOMINAL HYSTERECTOMY     REVERSE SHOULDER ARTHROPLASTY Right 10/21/2021   Procedure: RIGHT REVERSE TOTAL SHOULDER ARTHROPLASTY;  Surgeon: Hiram Gash, MD;  Location: [place];  Service: Orthopedics;  Laterality: Right;  allen bed, spider, tornier (rep aware), open shoulder tray.    Current Medications: Current Meds  Medication Sig   acetaminophen (TYLENOL) 500 MG tablet Take 1 tablet (500 mg total) by mouth every 6 (six) hours as needed for moderate pain, fever or mild pain.   albuterol (VENTOLIN HFA) 108 (90 Base) MCG/ACT inhaler Inhale 2 puffs into the lungs every 4 (four) hours as needed for wheezing or shortness of breath.   amiodarone (PACERONE) 200 MG tablet Take 400 mg daily for 2 weeks.  Starting Week 3 take 200 mg daily   apixaban (ELIQUIS) 5 MG TABS tablet Take 1 tablet (5 mg total) by mouth 2 (two) times daily.   aspirin EC 81 MG tablet Take  81 mg by mouth daily. Swallow whole.   atorvastatin (LIPITOR) 80 MG tablet TAKE 1 TABLET(80 MG) BY MOUTH DAILY   buPROPion (WELLBUTRIN) 75 MG tablet Take 1 tablet by mouth twice daily.   furosemide (LASIX) 20 MG tablet Take 2 tablets (40 mg total) by mouth daily.   losartan (COZAAR) 50 MG tablet Take 0.5 tablets (25 mg total) by mouth daily.   memantine (NAMENDA) 10 MG tablet Take 1 tablet by mouth in  the morning. (Patient taking differently: Take 20 mg by mouth daily.)   metoprolol succinate (TOPROL-XL) 25 MG 24 hr tablet Take 3 tablets (75 mg total) by mouth daily.   Multiple Vitamins-Minerals (MULTIVITAMIN WITH MINERALS) tablet Take 1 tablet by mouth daily.   potassium chloride (KLOR-CON) 10 MEQ tablet Take 1 tablet by mouth daily.   sertraline (ZOLOFT) 50 MG tablet Take 1 tablet by mouth daily.     Allergies:   Influenza virus vaccine, Myrbetriq [mirabegron er], Pneumococcal vaccine, and Ativan [lorazepam]   Social History   Socioeconomic History   Marital status: Unknown    Spouse name: Not on file   Number of children: Not on file   Years of education: Not on file   Highest education level: Not on file  Occupational History   Not on file  Tobacco Use   Smoking status: Former    Packs/day: 0.50    Types: Cigarettes   Smokeless tobacco: Not on file  Vaping Use   Vaping Use: Never used  Substance and Sexual Activity   Alcohol use: Yes    Alcohol/week: 6.0 standard drinks of alcohol    Types: 6 Cans of beer per week   Drug use: Never   Sexual activity: Yes  Other Topics Concern   Not on file  Social History Narrative   Not on file   Social Determinants of Health   Financial Resource Strain: Low Risk  (11/20/2021)   Overall Financial Resource Strain (CARDIA)    Difficulty of Paying Living Expenses: Not hard at all  Food Insecurity: No Food Insecurity (11/20/2021)   Hunger Vital Sign    Worried About Running Out of Food in the Last Year: Never true    Ran Out of Food in the Last Year: Never true  Transportation Needs: No Transportation Needs (11/20/2021)   PRAPARE - Hydrologist (Medical): No    Lack of Transportation (Non-Medical): No  Physical Activity: Inactive (11/20/2021)   Exercise Vital Sign    Days of Exercise per Week: 0 days    Minutes of Exercise per Session: 0 min  Stress: No Stress Concern Present (11/20/2021)   [place]    Feeling of Stress : Not at all  Social Connections: Socially Isolated (11/20/2021)   Social Connection and Isolation Panel [NHANES]    Frequency of Communication with Friends and Family: More than three times a week    Frequency of Social Gatherings with Friends and Family: Twice a week    Attends Religious Services: Never    Marine scientist or Organizations: No    Attends Music therapist: Never    Marital Status: Divorced     Family History: The patient's family history includes Arrhythmia in her father.  ROS:   Please see the history of present illness.     EKGs/Labs/Other Studies Reviewed:    The following studies were reviewed today:  Echocardiogram 11/27/2021: Impressions:  1. Left  ventricular ejection fraction, by estimation, is 40 to 45%. The  left ventricle has mildly decreased function. The left ventricle  demonstrates global hypokinesis. Left ventricular diastolic parameters are  indeterminate.   2. Right ventricular systolic function is mildly reduced. The right  ventricular size is normal. There is normal pulmonary artery systolic  pressure. The estimated right ventricular systolic pressure is 62.9 mmHg.   3. Left atrial size was moderately dilated.   4. The mitral valve is degenerative. Moderate mitral valve regurgitation,  suspect atrial functional MR. No evidence of mitral stenosis. Moderate  mitral annular calcification.   5. The aortic valve is tricuspid. There is moderate calcification of the  aortic valve. Aortic valve regurgitation is not visualized. Aortic valve  sclerosis/calcification is present, without any evidence of aortic  stenosis.   6. The inferior vena cava is normal in size with greater than 50%  respiratory variability, suggesting right atrial pressure of 3 mmHg.   7. The patient is in atrial fibrillation.   EKG:  EKG ordered today. EKG personally  reviewed and demonstrates atrial fibrillation, rate 97 bpm, with non-specific ST/T changes. Normal axis. QTc 467 ms.  Recent Labs: 10/15/2021: TSH 1.048 10/17/2021: ALT 13 11/26/2021: B Natriuretic Peptide 1,190.1; Hemoglobin 11.2; Platelets 344 11/27/2021: Magnesium 1.6 11/28/2021: BUN 13; Creatinine, Ser 0.69; Potassium 3.4; Sodium 136  Recent Lipid Panel    Component Value Date/Time   CHOL 202 (H) 10/30/2020 0015   TRIG 70 10/30/2020 0015   HDL 88 10/30/2020 0015   CHOLHDL 2.3 10/30/2020 0015   VLDL 14 10/30/2020 0015   LDLCALC 100 (H) 10/30/2020 0015    Physical Exam:    Vital Signs: BP 138/80 (BP Location: Right Arm, Patient Position: Sitting)   Pulse 97   Ht '5\' 4"'$  (1.626 m)   Wt 121 lb 3.2 oz (55 kg)   LMP  (LMP Unknown)   SpO2 99%   BMI 20.80 kg/m     Wt Readings from Last 3 Encounters:  12/05/21 121 lb 3.2 oz (55 kg)  11/28/21 118 lb 14.4 oz (53.9 kg)  11/20/21 131 lb (59.4 kg)     General: 76 y.o. female in no acute distress. HEENT: Normocephalic and atraumatic. Sclera clear.  Neck: Supple. No JVD. Heart: Irregularly irregular rhythm with normal rate. Distinct S1 and S2. No murmurs, gallops, or rubs. Radial  pulses 2+ and equal bilaterally. Lungs: No increased work of breathing. Clear to ausculation bilaterally. No wheezes, rhonchi, or rales.  Abdomen: Soft, non-distended, and non-tender to palpation.  Extremities: No lower extremity edema.    Skin: Warm and dry. Neuro: No focal deficits. Psych: Normal affect. Responds appropriately.  Assessment:    1. Chronic combined systolic and diastolic CHF (congestive heart failure) (HCC)   2. Persistent atrial fibrillation ([place])   3. Primary hypertension   4. Hypokalemia   5. Hypomagnesemia     Plan:    Chronic Combined CHF with Mildly Reduced EF Recently admitted with acute CHF and atrial fibrillation with RVR. Echo showed LVEF of 40-45% with global hypokinesis, mildly reduced RV systolic function, and moderate  MR. - Weight up 3lbs from discharge but appears euvolemic on exam. - Continue Lasix '40mg'$  daily. Takes KCl 83mq with this. - Continue Losartan '25mg'$  daliy. - Continue Toprol-XL '75mg'$  daily. - No SGLT2 inhibitors due to frequent UTIs. - Will repeat BMET today and if renal function stable will likely add Spironolactone 12.'5mg'$  daily. - Discussed the importance of daily weights and sodium/fluid restrictions.  - Drop  in EF may be due to atrial fibrillation but cannot rule out ischemic either. She does have multiple CV risk factors including history of stroke. Coronary CTA not a good option given her current atrial fibrillation. Will order cardiac PET for further evaluation.   Shared Decision Making/Informed Consent{ The risks [chest pain, shortness of breath, cardiac arrhythmias, dizziness, blood pressure fluctuations, myocardial infarction, stroke/transient ischemic attack, nausea, vomiting, allergic reaction, radiation exposure, metallic taste sensation and life-threatening complications (estimated to be 1 in 10,000)], benefits (risk stratification, diagnosing coronary artery disease, treatment guidance) and alternatives of a cardiac PET stress test were discussed in detail with Ms. Stetzel and she agrees to proceed.  Persistent  Atrial Fibrillation Patient was noted to be in atrial fibrillation during recent admission. Unclear how long she had been in this but she was in sinus rhythm on EKG in 09/2021.  - Remains in atrial fibrillation today with heart rates in the 90s. Asymptomatic with this.  - Continue Toprol-XL '75mg'$  daily. - Continue chronic anticoagulation with Eliquis '5mg'$  twice daily. She also takes Aspirin '81mg'$  daily - recommended stopping this given she is on Eliquis. - Discussed multiple treatment options including DCCV, antiarrhythmic therapy, and just focusing on rate control. Patient/ daughter would prefer to hold off on DCCV and try antiarrhythmic therapy. Therefore, will start Amiodarone  '400mg'$  daily x2 weeks and then decrease to '200mg'$  daily.   Moderate Mitral Regurgitation  Noted on Echo during recent admission. - Will continue routine surveillance.  Hypertension BP initially elevated at 160/90 but improved to 138/80 on my recheck at the end of visit. - Continue medications for CHF as above. If renal function stable, will likely add Spironolactone as above.  Hyperlipidemia Last lipid panel in 10/2020: Total Cholesterol 202, Triglycerides 70, HDL 88, LDL 100. - Continue Lipitor '80mg'$  daily. - Needs repeat labs. Did not have time to discuss this today. Can discuss at follow-up visit.    Hypokalemia Hypomagnesemia Potassium as low as 2.6 and magnesium as low at 1.3 during admission. Both were repleted. - Will repeat BMET and Magnesium today.   Disposition: Follow up with me in 1 month.   Medication Adjustments/Labs and Tests Ordered: Current medicines are reviewed at length with the patient today.  Concerns regarding medicines are outlined above.  Orders Placed This Encounter  Procedures   NM PET CT CARDIAC PERFUSION MULTI W/ABSOLUTE BLOODFLOW   Magnesium   Basic metabolic panel   EKG [id number]   Meds ordered this encounter  Medications   amiodarone (PACERONE) 200 MG tablet    Sig: Take 400 mg daily for 2 weeks.  Starting Week 3 take 200 mg daily    Dispense:  45 tablet    Refill:  0    Patient Instructions  Medication Instructions:  START Amiodarone  400 mg (2 tablets) daily for 2 weeks, then take 200 mg (1 tablet) daily going forward.  *If you need a refill on your cardiac medications before your next appointment, please call your pharmacy*  Lab Work: Your physician recommends that you return for lab work TODAY:  BMP Magnesium  If you have labs (blood work) drawn today and your tests are completely normal, you will receive your results only by: [place] (if you have MyChart) OR A paper copy in the mail If you have any lab test that is abnormal  or we need to change your treatment, we will call you to review the results.  Testing/Procedures: CARDIAC PET- Your physician has requested that you have  a Cardiac Pet Stress Test. This testing is completed at [facility] ([place], [place] [address]). The schedulers will call you to get this scheduled. Please follow instructions below and call the office with any questions/concerns [phone]).   Follow-Up: At [facility], you and your health needs are our priority.  As part of our continuing mission to provide you with exceptional heart care, we have created designated Provider Care Teams.  These Care Teams include your primary Cardiologist (physician) and Advanced Practice Providers (APPs -  Physician Assistants and Nurse Practitioners) who all work together to provide you with the care you need, when you need it.   Your next appointment:   1  month(s) 01/08/22 at 9:00 AM   The format for your next appointment:   In Person  Provider:   Sande Rives, PA-C        Other Instructions Heart Failure Education: Weigh yourself EVERY morning after you go to the bathroom but before you eat or drink anything. Write this number down in a weight log/diary. If you gain 3 pounds overnight or 5 pounds in a week, call the office. Take your medicines as prescribed. If you have concerns about your medications, please call us before you stop taking them.  Eat low salt foods--Limit salt (sodium) to 2000 mg per day. This will help prevent your body from holding onto fluid. Read food labels as many processed foods have a lot of sodium, especially canned goods and prepackaged meats. If you would like some assistance choosing low sodium foods, we would be happy to set you up with a nutritionist. Stay as active as you can everyday. Staying active will give you more energy and make your muscles stronger. Start with 5 minutes at a time and work your way up to 30 minutes a  day. Break up your activities--do some in the morning and some in the afternoon. Start with 3 days per week and work your way up to 5 days as you can.  If you have chest pain, feel short of breath, dizzy, or lightheaded, STOP. If you don't feel better after a short rest, call 911. If you do feel better, call the office to let us know you have symptoms with exercise. Limit all fluids for the day to less than 2 liters. Fluid includes all drinks, coffee, juice, ice chips, soup, jello, and all other liquids.  How to Prepare for Your Cardiac PET/CT Stress Test:  1. Please do not take these medications before your test:   Medications that may interfere with the cardiac pharmacological stress agent (ex. nitrates - including erectile dysfunction medications or beta-blockers) the day of the exam. (Erectile dysfunction medication should be held for at least 72 hrs prior to test) Theophylline containing medications for 12 hours. Dipyridamole 48 hours prior to the test. Your remaining medications may be taken with water.  2. Nothing to eat or drink, except water, 3 hours prior to arrival time.   NO caffeine/decaffeinated products, or chocolate 12 hours prior to arrival.  3. NO perfume, cologne or lotion  4. Total time is 1 to 2 hours; you may want to bring reading material for the waiting time.  5. Please report to Admitting at the [facility] Main Entrance 60 minutes early for your test.  [place], [place] [address]  Diabetic Preparation:  Hold oral medications. You may take NPH and Lantus insulin. Do not take Humalog or Humulin R (Regular  Insulin) the day of your test. Check blood sugars prior to leaving the house. If able to eat breakfast prior to 3 hour fasting, you may take all medications, including your insulin, Do not worry if you miss your breakfast dose of insulin - start at your next meal.  IF YOU THINK YOU MAY BE PREGNANT, OR ARE NURSING PLEASE INFORM THE  TECHNOLOGIST.  In preparation for your appointment, medication and supplies will be purchased.  Appointment availability is limited, so if you need to cancel or reschedule, please call the Radiology Department at [phone]  24 hours in advance to avoid a cancellation fee of $100.00  What to Expect After you Arrive:  Once you arrive and check in for your appointment, you will be taken to a preparation room within the Radiology Department.  A technologist or Nurse will obtain your medical history, verify that you are correctly prepped for the exam, and explain the procedure.  Afterwards,  an IV will be started in your arm and electrodes will be placed on your skin for EKG monitoring during the stress portion of the exam. Then you will be escorted to the PET/CT scanner.  There, staff will get you positioned on the scanner and obtain a blood pressure and EKG.  During the exam, you will continue to be connected to the EKG and blood pressure machines.  A small, safe amount of a radioactive tracer will be injected in your IV to obtain a series of pictures of your heart along with an injection of a stress agent.    After your Exam:  It is recommended that you eat a meal and drink a caffeinated beverage to counter act any effects of the stress agent.  Drink plenty of fluids for the remainder of the day and urinate frequently for the first couple of hours after the exam.  Your doctor will inform you of your test results within 7-10 business days.  For questions about your test or how to prepare for your test, please call: Marchia Bond, Cardiac Imaging Nurse Navigator  Gordy Clement, Cardiac Imaging Nurse Navigator Office: [phone]   Important Information About Sugar         Signed, Eppie Gibson  12/05/2021 9:34 AM    [place]

## 2021-12-02 ENCOUNTER — Encounter: Payer: Medicare Other | Admitting: Internal Medicine

## 2021-12-05 ENCOUNTER — Encounter: Payer: Self-pay | Admitting: Student

## 2021-12-05 ENCOUNTER — Encounter: Payer: Medicare Other | Admitting: Internal Medicine

## 2021-12-05 ENCOUNTER — Ambulatory Visit (INDEPENDENT_AMBULATORY_CARE_PROVIDER_SITE_OTHER): Payer: Medicare Other | Admitting: Student

## 2021-12-05 VITALS — BP 138/80 | HR 97 | Ht 64.0 in | Wt 121.2 lb

## 2021-12-05 DIAGNOSIS — I5042 Chronic combined systolic (congestive) and diastolic (congestive) heart failure: Secondary | ICD-10-CM | POA: Diagnosis not present

## 2021-12-05 DIAGNOSIS — I10 Essential (primary) hypertension: Secondary | ICD-10-CM | POA: Diagnosis not present

## 2021-12-05 DIAGNOSIS — I4819 Other persistent atrial fibrillation: Secondary | ICD-10-CM | POA: Diagnosis not present

## 2021-12-05 DIAGNOSIS — E876 Hypokalemia: Secondary | ICD-10-CM | POA: Diagnosis not present

## 2021-12-05 LAB — MAGNESIUM: Magnesium: 1.6 mg/dL (ref 1.6–2.3)

## 2021-12-05 LAB — BASIC METABOLIC PANEL
BUN/Creatinine Ratio: 13 (ref 12–28)
BUN: 9 mg/dL (ref 8–27)
CO2: 30 mmol/L — ABNORMAL HIGH (ref 20–29)
Calcium: 9.7 mg/dL (ref 8.7–10.3)
Chloride: 95 mmol/L — ABNORMAL LOW (ref 96–106)
Creatinine, Ser: 0.69 mg/dL (ref 0.57–1.00)
Glucose: 86 mg/dL (ref 70–99)
Potassium: 3.9 mmol/L (ref 3.5–5.2)
Sodium: 139 mmol/L (ref 134–144)
eGFR: 92 mL/min/{1.73_m2} (ref >59)

## 2021-12-05 MED ORDER — AMIODARONE HCL 200 MG PO TABS
ORAL_TABLET | ORAL | 0 refills | Status: DC
Start: 2021-12-05 — End: 2022-01-02

## 2021-12-05 NOTE — Patient Instructions (Signed)
Medication Instructions:  START Amiodarone  400 mg (2 tablets) daily for 2 weeks, then take 200 mg (1 tablet) daily going forward.  *If you need a refill on your cardiac medications before your next appointment, please call your pharmacy*  Lab Work: Your physician recommends that you return for lab work TODAY:  BMP Magnesium  If you have labs (blood work) drawn today and your tests are completely normal, you will receive your results only by: [place] (if you have MyChart) OR A paper copy in the mail If you have any lab test that is abnormal or we need to change your treatment, we will call you to review the results.  Testing/Procedures: CARDIAC PET- Your physician has requested that you have a Cardiac Pet Stress Test. This testing is completed at [facility] ([place], [place] [address]). The schedulers will call you to get this scheduled. Please follow instructions below and call the office with any questions/concerns [phone]).   Follow-Up: At [facility], you and your health needs are our priority.  As part of our continuing mission to provide you with exceptional heart care, we have created designated Provider Care Teams.  These Care Teams include your primary Cardiologist (physician) and Advanced Practice Providers (APPs -  Physician Assistants and Nurse Practitioners) who all work together to provide you with the care you need, when you need it.   Your next appointment:   1  month(s) 01/08/22 at 9:00 AM   The format for your next appointment:   In Person  Provider:   Sande Rives, PA-C        Other Instructions Heart Failure Education: Weigh yourself EVERY morning after you go to the bathroom but before you eat or drink anything. Write this number down in a weight log/diary. If you gain 3 pounds overnight or 5 pounds in a week, call the office. Take your medicines as prescribed. If you have concerns about your  medications, please call us before you stop taking them.  Eat low salt foods--Limit salt (sodium) to 2000 mg per day. This will help prevent your body from holding onto fluid. Read food labels as many processed foods have a lot of sodium, especially canned goods and prepackaged meats. If you would like some assistance choosing low sodium foods, we would be happy to set you up with a nutritionist. Stay as active as you can everyday. Staying active will give you more energy and make your muscles stronger. Start with 5 minutes at a time and work your way up to 30 minutes a day. Break up your activities--do some in the morning and some in the afternoon. Start with 3 days per week and work your way up to 5 days as you can.  If you have chest pain, feel short of breath, dizzy, or lightheaded, STOP. If you don't feel better after a short rest, call 911. If you do feel better, call the office to let us know you have symptoms with exercise. Limit all fluids for the day to less than 2 liters. Fluid includes all drinks, coffee, juice, ice chips, soup, jello, and all other liquids.  How to Prepare for Your Cardiac PET/CT Stress Test:  1. Please do not take these medications before your test:   Medications that may interfere with the cardiac pharmacological stress agent (ex. nitrates - including erectile dysfunction medications or beta-blockers) the day of the exam. (Erectile dysfunction medication should be held for at least 72 hrs prior  to test) Theophylline containing medications for 12 hours. Dipyridamole 48 hours prior to the test. Your remaining medications may be taken with water.  2. Nothing to eat or drink, except water, 3 hours prior to arrival time.   NO caffeine/decaffeinated products, or chocolate 12 hours prior to arrival.  3. NO perfume, cologne or lotion  4. Total time is 1 to 2 hours; you may want to bring reading material for the waiting time.  5. Please report to Admitting at the [facility] Main Entrance 60 minutes early for your test.  [place], [place] [address]  Diabetic Preparation:  Hold oral medications. You may take NPH and Lantus insulin. Do not take Humalog or Humulin R (Regular Insulin) the day of your test. Check blood sugars prior to leaving the house. If able to eat breakfast prior to 3 hour fasting, you may take all medications, including your insulin, Do not worry if you miss your breakfast dose of insulin - start at your next meal.  IF YOU THINK YOU MAY BE PREGNANT, OR ARE NURSING PLEASE INFORM THE TECHNOLOGIST.  In preparation for your appointment, medication and supplies will be purchased.  Appointment availability is limited, so if you need to cancel or reschedule, please call the Radiology Department at [phone]  24 hours in advance to avoid a cancellation fee of $100.00  What to Expect After you Arrive:  Once you arrive and check in for your appointment, you will be taken to a preparation room within the Radiology Department.  A technologist or Nurse will obtain your medical history, verify that you are correctly prepped for the exam, and explain the procedure.  Afterwards,  an IV will be started in your arm and electrodes will be placed on your skin for EKG monitoring during the stress portion of the exam. Then you will be escorted to the PET/CT scanner.  There, staff will get you positioned on the scanner and obtain a blood pressure and EKG.  During the exam, you will continue to be connected to the EKG and blood pressure machines.  A small, safe amount of a radioactive tracer will be injected in your IV to obtain a series of pictures of your heart along with an injection of a stress agent.    After your Exam:  It is recommended that you eat a meal and drink a caffeinated beverage to counter act any effects of the stress agent.  Drink plenty of fluids for the remainder of the day and urinate frequently for the first couple  of hours after the exam.  Your doctor will inform you of your test results within 7-10 business days.  For questions about your test or how to prepare for your test, please call: Marchia Bond, Cardiac Imaging Nurse Navigator  Gordy Clement, Cardiac Imaging Nurse Navigator Office: [phone]   Important Information About Sugar

## 2021-12-05 NOTE — Addendum Note (Signed)
Addended by: Sande Rives on: 12/05/2021 12:29 PM   Modules accepted: Orders

## 2021-12-09 ENCOUNTER — Encounter (HOSPITAL_COMMUNITY): Payer: Medicare Other

## 2021-12-09 ENCOUNTER — Telehealth (HOSPITAL_COMMUNITY): Payer: Self-pay | Admitting: *Deleted

## 2021-12-09 NOTE — Telephone Encounter (Signed)
Call attempted to confirm HV TOC appt 3 pm on 12/09/21. HIPPA appropriate VM left with callback number.     Earnestine Leys, BSN, Clinical cytogeneticist Only

## 2021-12-10 ENCOUNTER — Other Ambulatory Visit: Payer: Self-pay

## 2021-12-10 DIAGNOSIS — I10 Essential (primary) hypertension: Secondary | ICD-10-CM

## 2021-12-10 DIAGNOSIS — E876 Hypokalemia: Secondary | ICD-10-CM

## 2021-12-10 MED ORDER — SPIRONOLACTONE 25 MG PO TABS: 12.5000 mg | ORAL_TABLET | Freq: Every day | ORAL | 3 refills | Status: DC

## 2021-12-27 NOTE — Progress Notes (Deleted)
Cardiology Office Note:    Date:  12/27/2021   ID:  Marisa Nunez, DOB 1945-07-17, MRN [id number]  PCP:  [name], MD  Cardiologist:  Early Osmond, MD  Electrophysiologist:  None   Referring MD: [name], MD   Chief Complaint: follow-up of CHF and atrial fibrillaiton  History of Present Illness:    Marisa Nunez is a 76 y.o. 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 Ali Lowe and presents today for follow-up of CHF and atrial fibrillation.  Patient was previously followed by Dr. Roselie Awkward in [place], [place] primarily for paroxysmal atrial fibrillation. She moved to [facility] in 2022. Prior Echos in 06/2018 and 10/2020 showed LVEF of 55-60% and grade 1 diastolic dysfunction. Patient was recently admitted from 11/26/2021 to 11/28/2021 for acute CHF and atrial fibrillation with RVR after presenting with shortness of breath and lower extremity edema. Echo showed LVEF of 40-45% with global hypokinesis, mildly reduced RV systolic function, and moderate MR. She wad diuresed with IV Lasix with good urinary response and then transitioned to PO Lasix '40mg'$  daily. Home Toprol-XL was increased for better heart rate control and home and she was continued on home Losartan. She was not started on SGLT2 inhibitor due to frequent UTIs.  Patient was last seen by me on 12/05/2021 at which time patient was in rate controlled atrial fibrillation but was essentially asymptomatic with this with only a brief episode of fluttering the day before. Her shortness of breath and lower extremity edema had resolved. She was started on Spironolactone 12.'5mg'$  daily and Aspirin was stopped given she was on Eliquis. We had a long discussion about the multiple treatment options for her persistent atrial fibrillation including DCCV vs  antiarrhythmic therapy vs rate control. Patient/ daughter wanted to try antiarrhythmic therapy so she was started on Amiodarone. Cardiac PET CT was also ordered for further evaluation of CAD as cause of cardiomyopathy.  Patient presents today for follow-up. ***  Chronic Combined CHF with Mildly Reduced EF Recently admitted with acute CHF and atrial fibrillation with RVR. Echo showed LVEF of 40-45% with global hypokinesis, mildly reduced RV systolic function, and moderate MR. - Weight up 3lbs from discharge but appears euvolemic on exam. - Continue Lasix '40mg'$  daily. Takes KCl 37mq with this. - Continue Losartan '25mg'$  daliy. - Continue Toprol-XL '75mg'$  daily. - No SGLT2 inhibitors due to frequent UTIs. - Will repeat BMET today and if renal function stable will likely add Spironolactone 12.'5mg'$  daily. - Discussed the importance of daily weights and sodium/fluid restrictions.  - Drop in EF may be due to atrial fibrillation but cannot rule out ischemic either. She does have multiple CV risk factors including history of stroke. Coronary CTA not a good option given her current atrial fibrillation. Cardiac PET was ordered at last visit but due to wait list this has not been done yet.    Persistent  Atrial Fibrillation Patient was noted to be in atrial fibrillation during recent admission. Unclear how long she had been in this but she was in sinus rhythm on EKG in 09/2021.  - *** - Continue Toprol-XL '75mg'$  daily. - Continue Amiodarone '200mg'$  daily. - Continue chronic anticoagulation with Eliquis '5mg'$  twice daily.   - Will check CMET and TSH today to monitor for Amiodarone toxicity. Also discussed the needed for annual eye exams. ***  Moderate Mitral Regurgitation  Noted on Echo in 11/2021. - Will continue routine surveillance.   Hypertension BP *** - Continue medications for CHF as above.    Hyperlipidemia Last lipid panel in 10/2020: Total Cholesterol 202, Triglycerides 70, HDL 88, LDL 100. -  Continue Lipitor '80mg'$  daily. - Needs repeat labs. Did not have time to discuss this today. Can discuss at follow-up visit. ***  Past Medical History:  Diagnosis Date   Anxiety    Breast cancer ([place])    Chronic combined systolic and diastolic CHF (congestive heart failure) ([place])    a. Echo 11/2021: LVEF of 40-45% with global hypokinesis, mildly reduced RV systolic function, and moderate MR   COPD (chronic obstructive pulmonary disease) ([facility])    Dementia ([facility])    Depression    HTN (hypertension)    Paroxysmal atrial fibrillation ([place]) 2017   Stroke [facility])     Past Surgical History:  Procedure Laterality Date   ABDOMINAL HYSTERECTOMY     REVERSE SHOULDER ARTHROPLASTY Right 10/21/2021   Procedure: RIGHT REVERSE TOTAL SHOULDER ARTHROPLASTY;  Surgeon: Hiram Gash, MD;  Location: [place];  Service: Orthopedics;  Laterality: Right;  allen bed, spider, tornier (rep aware), open shoulder tray.    Current Medications: No outpatient medications have been marked as taking for the 01/08/22 encounter (Appointment) with Darreld Mclean, PA-C.     Allergies:   Influenza virus vaccine, Myrbetriq [mirabegron er], Pneumococcal vaccine, and Ativan [lorazepam]   Social History   Socioeconomic History   Marital status: Unknown    Spouse name: Not on file   Number of children: Not on file   Years of education: Not on file   Highest education level: Not on file  Occupational History   Not on file  Tobacco Use   Smoking status: Former    Packs/day: 0.50    Types: Cigarettes   Smokeless tobacco: Not on file  Vaping Use   Vaping Use: Never used  Substance and Sexual Activity   Alcohol use: Yes    Alcohol/week: 6.0 standard drinks of alcohol    Types: 6 Cans of beer per week   Drug use: Never   Sexual activity: Yes  Other Topics Concern   Not on file  Social History Narrative   Not on file   Social Determinants of Health   Financial Resource Strain: Low Risk  (11/20/2021)   Overall  Financial Resource Strain (CARDIA)    Difficulty of Paying Living Expenses: Not hard at all  Food Insecurity: No Food Insecurity (11/20/2021)   Hunger Vital Sign    Worried About Running Out of Food in the Last Year: Never true    Ran Out of Food in the Last Year: Never true  Transportation Needs: No Transportation Needs (11/20/2021)   PRAPARE - Hydrologist (Medical): No    Lack of Transportation (Non-Medical): No  Physical Activity: Inactive (11/20/2021)   Exercise Vital Sign    Days of Exercise per Week: 0 days    Minutes of Exercise per Session: 0 min  Stress: No Stress Concern Present (11/20/2021)   [place]    Feeling of Stress : Not at all  Social Connections: Socially Isolated (11/20/2021)   Social Connection and Isolation Panel [NHANES]    Frequency of Communication with Friends and Family: More than three times a week    Frequency of Social Gatherings with Friends and Family: Twice a week  Attends Religious Services: Never    Active Member of Clubs or Organizations: No    Attends Archivist Meetings: Never    Marital Status: Divorced     Family History: The patient's family history includes Arrhythmia in her father.  ROS:   Please see the history of present illness.     EKGs/Labs/Other Studies Reviewed:    The following studies were reviewed:  Echocardiogram 11/27/2021: Impressions:  1. Left ventricular ejection fraction, by estimation, is 40 to 45%. The  left ventricle has mildly decreased function. The left ventricle  demonstrates global hypokinesis. Left ventricular diastolic parameters are  indeterminate.   2. Right ventricular systolic function is mildly reduced. The right  ventricular size is normal. There is normal pulmonary artery systolic  pressure. The estimated right ventricular systolic pressure is 37.1 mmHg.   3. Left atrial size was moderately  dilated.   4. The mitral valve is degenerative. Moderate mitral valve regurgitation,  suspect atrial functional MR. No evidence of mitral stenosis. Moderate  mitral annular calcification.   5. The aortic valve is tricuspid. There is moderate calcification of the  aortic valve. Aortic valve regurgitation is not visualized. Aortic valve  sclerosis/calcification is present, without any evidence of aortic  stenosis.   6. The inferior vena cava is normal in size with greater than 50%  respiratory variability, suggesting right atrial pressure of 3 mmHg.   7. The patient is in atrial fibrillation.   EKG:  EKG  ordered today. EKG personally reviewed and demonstrates ***.  Recent Labs: 10/15/2021: TSH 1.048 10/17/2021: ALT 13 11/26/2021: B Natriuretic Peptide 1,190.1; Hemoglobin 11.2; Platelets 344 12/05/2021: BUN 9; Creatinine, Ser 0.69; Magnesium 1.6; Potassium 3.9; Sodium 139  Recent Lipid Panel    Component Value Date/Time   CHOL 202 (H) 10/30/2020 0015   TRIG 70 10/30/2020 0015   HDL 88 10/30/2020 0015   CHOLHDL 2.3 10/30/2020 0015   VLDL 14 10/30/2020 0015   LDLCALC 100 (H) 10/30/2020 0015    Physical Exam:    Vital Signs: LMP  (LMP Unknown)     Wt Readings from Last 3 Encounters:  12/05/21 121 lb 3.2 oz (55 kg)  11/28/21 118 lb 14.4 oz (53.9 kg)  11/20/21 131 lb (59.4 kg)     General: 76 y.o. female in no acute distress. HEENT: Normocephalic and atraumatic. Sclera clear. EOMs intact. Neck: Supple. No carotid bruits. No JVD. Heart: *** RRR. Distinct S1 and S2. No murmurs, gallops, or rubs. Radial and distal pedal pulses 2+ and equal bilaterally. Lungs: No increased work of breathing. Clear to ausculation bilaterally. No wheezes, rhonchi, or rales.  Abdomen: Soft, non-distended, and non-tender to palpation. Bowel sounds present in all 4 quadrants.  MSK: Normal strength and tone for age. *** Extremities: No lower extremity edema.    Skin: Warm and dry. Neuro: Alert and  oriented x3. No focal deficits. Psych: Normal affect. Responds appropriately.   Assessment:    No diagnosis found.  Plan:     Disposition: Follow up in ***   Medication Adjustments/Labs and Tests Ordered: Current medicines are reviewed at length with the patient today.  Concerns regarding medicines are outlined above.  No orders of the defined types were placed in this encounter.  No orders of the defined types were placed in this encounter.   There are no Patient Instructions on file for this visit.   Signed, Darreld Mclean, PA-C  12/27/2021 11:12 AM    [place]

## 2022-01-01 ENCOUNTER — Other Ambulatory Visit: Payer: Self-pay

## 2022-01-02 ENCOUNTER — Other Ambulatory Visit: Payer: Self-pay | Admitting: Student

## 2022-01-02 MED ORDER — METOPROLOL SUCCINATE ER 25 MG PO TB24
75.0000 mg | ORAL_TABLET | Freq: Every day | ORAL | 0 refills | Status: DC
Start: 2022-01-02 — End: 2022-02-27

## 2022-01-08 ENCOUNTER — Ambulatory Visit: Payer: Medicare Other | Attending: Student | Admitting: Student

## 2022-01-16 ENCOUNTER — Emergency Department (HOSPITAL_COMMUNITY): Payer: Medicare Other

## 2022-01-16 ENCOUNTER — Encounter (HOSPITAL_COMMUNITY): Payer: Self-pay

## 2022-01-16 ENCOUNTER — Emergency Department (HOSPITAL_COMMUNITY)
Admission: EM | Admit: 2022-01-16 | Discharge: 2022-01-16 | Discharge status: Against Medical Advice | Payer: Medicare Other | Attending: Emergency Medicine | Admitting: Emergency Medicine

## 2022-01-16 ENCOUNTER — Other Ambulatory Visit: Payer: Self-pay

## 2022-01-16 DIAGNOSIS — R0602 Shortness of breath: Secondary | ICD-10-CM | POA: Insufficient documentation

## 2022-01-16 DIAGNOSIS — F0390 Unspecified dementia without behavioral disturbance: Secondary | ICD-10-CM | POA: Diagnosis not present

## 2022-01-16 DIAGNOSIS — Z5321 Procedure and treatment not carried out due to patient leaving prior to being seen by health care provider: Secondary | ICD-10-CM | POA: Diagnosis not present

## 2022-01-16 NOTE — ED Provider Triage Note (Signed)
Emergency Medicine Provider Triage Evaluation Note  Marisa Nunez , a 76 y.o. female  was evaluated in triage.  Pt complains of shortness of breath.  Patient has a history of dementia.  Had an episode of shortness of breath this morning lasting a few minutes.  Currently asymptomatic.  Daughter is present.  Review of Systems  Positive: As above Negative: All systems negative  Physical Exam  BP (!) 146/102 (BP Location: Left Arm) Comment: pt stated she hasnt had medication thia am  Pulse 81   Temp 98.5 F (36.9 C) (Oral)   Resp 18   Ht '5\' 4"'$  (1.626 m)   Wt 53.1 kg   LMP  (LMP Unknown)   SpO2 98%   BMI 20.08 kg/m  Gen:   Awake, no distress   Resp:  Normal effort  MSK:   Moves extremities without difficulty  Other:    Medical Decision Making  Medically screening exam initiated at 11:30 AM.  Appropriate orders placed.  Thersa Mohiuddin was informed that the remainder of the evaluation will be completed by another provider, this initial triage assessment does not replace that evaluation, and the importance of remaining in the ED until their evaluation is complete.  Chest x-ray, EKG, basic labs   Roylene Reason, Hershal Coria 01/16/22 1131

## 2022-01-16 NOTE — ED Triage Notes (Signed)
Patient c/o SOB and a non productive cough since yesterday,  Patient has a history of CHF and atrial fib.

## 2022-01-31 ENCOUNTER — Emergency Department (HOSPITAL_COMMUNITY)
Admission: EM | Admit: 2022-01-31 | Discharge: 2022-01-31 | Disposition: A | Discharge status: Home / Self Care | Payer: Medicare Other | Attending: Emergency Medicine | Admitting: Emergency Medicine

## 2022-01-31 ENCOUNTER — Emergency Department (HOSPITAL_COMMUNITY): Payer: Medicare Other

## 2022-01-31 DIAGNOSIS — R42 Dizziness and giddiness: Secondary | ICD-10-CM

## 2022-01-31 DIAGNOSIS — Z7982 Long term (current) use of aspirin: Secondary | ICD-10-CM | POA: Diagnosis not present

## 2022-01-31 DIAGNOSIS — R0602 Shortness of breath: Secondary | ICD-10-CM | POA: Diagnosis present

## 2022-01-31 DIAGNOSIS — I509 Heart failure, unspecified: Secondary | ICD-10-CM | POA: Insufficient documentation

## 2022-01-31 DIAGNOSIS — I4891 Unspecified atrial fibrillation: Secondary | ICD-10-CM | POA: Insufficient documentation

## 2022-01-31 DIAGNOSIS — Z7901 Long term (current) use of anticoagulants: Secondary | ICD-10-CM | POA: Diagnosis not present

## 2022-01-31 LAB — CBC WITH DIFFERENTIAL/PLATELET
Abs Immature Granulocytes: 0.12 10*3/uL — ABNORMAL HIGH (ref 0.00–0.07)
Basophils Absolute: 0.1 10*3/uL (ref 0.0–0.1)
Basophils Relative: 1 %
Eosinophils Absolute: 0.1 10*3/uL (ref 0.0–0.5)
Eosinophils Relative: 1 %
HCT: 38.2 % (ref 36.0–46.0)
Hemoglobin: 12 g/dL (ref 12.0–15.0)
Immature Granulocytes: 2 %
Lymphocytes Relative: 13 %
Lymphs Abs: 0.9 10*3/uL (ref 0.7–4.0)
MCH: 26.5 pg (ref 26.0–34.0)
MCHC: 31.4 g/dL (ref 30.0–36.0)
MCV: 84.5 fL (ref 80.0–100.0)
Monocytes Absolute: 0.5 10*3/uL (ref 0.1–1.0)
Monocytes Relative: 7 %
Neutro Abs: 5.4 10*3/uL (ref 1.7–7.7)
Neutrophils Relative %: 76 %
Platelets: 293 10*3/uL (ref 150–400)
RBC: 4.52 MIL/uL (ref 3.87–5.11)
RDW: 14.9 % (ref 11.5–15.5)
WBC: 7 10*3/uL (ref 4.0–10.5)
nRBC: 0 % (ref 0.0–0.2)

## 2022-01-31 LAB — BASIC METABOLIC PANEL
Anion gap: 7 (ref 5–15)
BUN: 11 mg/dL (ref 8–23)
CO2: 29 mmol/L (ref 22–32)
Calcium: 8.9 mg/dL (ref 8.9–10.3)
Chloride: 100 mmol/L (ref 98–111)
Creatinine, Ser: 0.66 mg/dL (ref 0.44–1.00)
GFR, Estimated: >60 mL/min (ref >60)
Glucose, Bld: 104 mg/dL — ABNORMAL HIGH (ref 70–99)
Potassium: 3.5 mmol/L (ref 3.5–5.1)
Sodium: 136 mmol/L (ref 135–145)

## 2022-01-31 LAB — MAGNESIUM: Magnesium: 2 mg/dL (ref 1.7–2.4)

## 2022-01-31 LAB — URINALYSIS, ROUTINE W REFLEX MICROSCOPIC
Bilirubin Urine: NEGATIVE
Glucose, UA: NEGATIVE mg/dL
Hgb urine dipstick: NEGATIVE
Ketones, ur: NEGATIVE mg/dL
Leukocytes,Ua: NEGATIVE
Nitrite: NEGATIVE
Protein, ur: NEGATIVE mg/dL
Specific Gravity, Urine: 1.005 (ref 1.005–1.030)
pH: 7 (ref 5.0–8.0)

## 2022-01-31 LAB — BRAIN NATRIURETIC PEPTIDE: B Natriuretic Peptide: 589.2 pg/mL — ABNORMAL HIGH (ref 0.0–100.0)

## 2022-01-31 NOTE — ED Provider Notes (Signed)
[facility] DEPT Provider Note   CSN: [id number] Arrival date & time: 01/31/22  1013     History  Chief Complaint  Patient presents with   Shortness of Breath    Marisa Nunez is a 76 y.o. female.  HPI     76 year old female with history of CHF with preserved EF, A-fib comes in with chief complaint of shortness of breath, dizziness.  Patient states that over the last 3 days she has had some shortness of breath.  Shortness of breath has no specific evoking, aggravating relieving factor.  She denies any orthopnea, PND.  Shortness of breath typically with walking.  Patient also has had some dizziness.  Dizziness is not described as vertigo, unsteadiness or even near syncope.  She just feels " dizzy".  She is not requiring any assistance with walking.  Patient has been taking her medications as prescribed.  Home Medications Prior to Admission medications   Medication Sig Start Date End Date Taking? Authorizing Provider  spironolactone (ALDACTONE) 25 MG tablet Take 0.5 tablets (12.5 mg total) by mouth daily. 12/10/21 11/30/23  Darreld Mclean, PA-C  acetaminophen (TYLENOL) 500 MG tablet Take 1 tablet (500 mg total) by mouth every 6 (six) hours as needed for moderate pain, fever or mild pain. 10/24/21   Pokhrel, Corrie Mckusick, MD  albuterol (VENTOLIN HFA) 108 (90 Base) MCG/ACT inhaler Inhale 2 puffs into the lungs every 4 (four) hours as needed for wheezing or shortness of breath.    [provider]  amiodarone (PACERONE) 200 MG tablet TAKE 2 TABLETS BY MOUTH DAILY FOR 2 WEEKS, THEN START TAKING 1 TABLET DAILY 01/02/22   Sande Rives E, PA-C  apixaban (ELIQUIS) 5 MG TABS tablet Take 1 tablet (5 mg total) by mouth 2 (two) times daily. 06/25/21   Timothy Lasso, MD  aspirin EC 81 MG tablet Take 81 mg by mouth daily. Swallow whole.    [provider]  atorvastatin (LIPITOR) 80 MG tablet TAKE 1 TABLET(80 MG) BY MOUTH DAILY 06/25/21   Timothy Lasso, MD  buPROPion [facility]) 75 MG tablet Take 1 tablet by mouth twice daily. 10/15/21   Timothy Lasso, MD  furosemide (LASIX) 20 MG tablet Take 2 tablets (40 mg total) by mouth daily. 11/28/21 11/28/22  Mapp, Claudia Desanctis, MD  losartan (COZAAR) 50 MG tablet Take 0.5 tablets (25 mg total) by mouth daily. 11/28/21   Mapp, Claudia Desanctis, MD  memantine (NAMENDA) 10 MG tablet Take 1 tablet by mouth in the morning. Patient taking differently: Take 20 mg by mouth daily. 09/06/21   Timothy Lasso, MD  metoprolol succinate (TOPROL-XL) 25 MG 24 hr tablet Take 3 tablets (75 mg total) by mouth daily. 01/02/22   [name], MD  Multiple Vitamins-Minerals (MULTIVITAMIN WITH MINERALS) tablet Take 1 tablet by mouth daily.    [provider]  potassium chloride (KLOR-CON) 10 MEQ tablet Take 1 tablet by mouth daily. 10/17/21   Timothy Lasso, MD  sertraline (ZOLOFT) 50 MG tablet Take 1 tablet by mouth daily. 10/17/21   Timothy Lasso, MD      Allergies    Influenza virus vaccine, Myrbetriq [mirabegron er], Pneumococcal vaccine, and Ativan [lorazepam]    Review of Systems   Review of Systems  All other systems reviewed and are negative.   Physical Exam Updated Vital Signs BP 137/77   Pulse 94   Temp (!) 97.5 F (36.4 C) (Oral)   Resp 19   Ht '5\' 4"'$  (1.626 m)   Wt 53.1 kg  LMP  (LMP Unknown)   SpO2 100%   BMI 20.08 kg/m  Physical Exam Vitals and nursing note reviewed.  Constitutional:      Appearance: She is well-developed.  HENT:     Head: Atraumatic.  Eyes:     Extraocular Movements: Extraocular movements intact.     Pupils: Pupils are equal, round, and reactive to light.     Comments: No visual field deficits  Cardiovascular:     Rate and Rhythm: Normal rate.  Pulmonary:     Effort: Pulmonary effort is normal.     Breath sounds: No decreased breath sounds, wheezing, rhonchi or rales.  Musculoskeletal:     Cervical back: Normal range of motion and neck supple.  Skin:    General:  Skin is warm and dry.  Neurological:     Mental Status: She is alert and oriented to person, place, and time.     Cranial Nerves: No cranial nerve deficit.     Motor: No weakness.     Comments: No dysmetria     ED Results / Procedures / Treatments   Labs (all labs ordered are listed, but only abnormal results are displayed) Labs Reviewed  BASIC METABOLIC PANEL - Abnormal; Notable for the following components:      Result Value   Glucose, Bld 104 (*)    All other components within normal limits  CBC WITH DIFFERENTIAL/PLATELET - Abnormal; Notable for the following components:   Abs Immature Granulocytes 0.12 (*)    All other components within normal limits  BRAIN NATRIURETIC PEPTIDE - Abnormal; Notable for the following components:   B Natriuretic Peptide 589.2 (*)    All other components within normal limits  URINALYSIS, ROUTINE W REFLEX MICROSCOPIC - Abnormal; Notable for the following components:   Color, Urine STRAW (*)    All other components within normal limits  MAGNESIUM    EKG EKG Interpretation  Date/Time:  Friday January 31 2022 12:17:59 EDT Ventricular Rate:  102 PR Interval:    QRS Duration: 88 QT Interval:  404 QTC Calculation: 527 R Axis:   7 Text Interpretation: Atrial fibrillation Prolonged QT interval No acute changes No significant change since last tracing Confirmed by Varney Biles [phone]) on 01/31/2022 1:27:21 PM  Radiology No results found.  Procedures Procedures    Medications Ordered in ED Medications - No data to display  ED Course/ Medical Decision Making/ A&P                           Medical Decision Making Amount and/or Complexity of Data Reviewed Labs: ordered. Radiology: ordered.   This patient presents to the ED with chief complaint(s) of dizziness, shortness of breath with pertinent past medical history of A-fib, CHF with preserved EF.The complaint involves an extensive differential diagnosis and also carries with it a high  risk of complications and morbidity.    The differential diagnosis includes symptomatic A-fib, CHF, symptomatic anemia, severe electrolyte abnormality, orthostatic hypotension, arrhythmia. Low suspicion for PE.  Patient has no PE risk factors, denies any leg pain or swelling.  Lower extremity exam did not increase suspicion for DVT.  The initial plan is to get basic labs, have patient on cardiac monitoring.   Additional history obtained: Records reviewed  patient's previous admission and echocardiogram, medications were reviewed  Independent labs interpretation:  The following labs were independently interpreted: BNP is elevated at about 500.  CBC is reassuring.  Independent visualization and interpretation of  imaging: - I independently visualized the following imaging with scope of interpretation limited to determining acute life threatening conditions related to emergency care: X-ray of the chest, which revealed no evidence of pulmonary edema  Treatment and Reassessment: Results of the ER work-up discussed with the patient.  On the telemetry, she has not had any tacky dysrhythmias.  Patient has had A-fib, with heart rate fluctuating between 70- 110.  Suspect that could be causing her to feel dizzy and have shortness of breath. No decompensation seen in the ER requiring admission. Patient comfortable going home.  Advised PCP follow-up.  A-fib clinic follow-up information also provided.  Strict ER return precautions have been discussed, and patient is agreeing with the plan and is comfortable with the workup done and the recommendations from the ER.   Final Clinical Impression(s) / ED Diagnoses Final diagnoses:  Atrial fibrillation, unspecified type ([place])  Dizziness    Rx / DC Orders ED Discharge Orders     None         Varney Biles, MD 01/31/22 1439

## 2022-01-31 NOTE — Discharge Instructions (Addendum)
We saw you in the ER for dizziness and shortness of breath. All the results in the ER are normal, labs and imaging.  No evidence of severe heart failure.  We are not sure what is causing your symptoms.  We suspect that it could be because you are in A-fib.  Recommendation will be for you to follow-up with your primary care doctor in 5 to 7 days as the workup in the ER is not complete, and is limited to screening for life threatening and emergent conditions only.  We have also given you phone number for A-fib clinic in [place] if you so desire to follow-up with them.  Please return to the ER if you start having constant dizziness, slurred speech, double vision, difficulty swallowing, one-sided weakness or numbness.

## 2022-01-31 NOTE — ED Notes (Signed)
Daughter called, states she will be here in 15 minutes

## 2022-01-31 NOTE — ED Triage Notes (Signed)
Ems brings pt in from home for shortness of breath. States she has felt more short of breath over the past 3 days.

## 2022-01-31 NOTE — ED Notes (Signed)
Pt ambulated to restroom without assistance.

## 2022-01-31 NOTE — ED Notes (Signed)
This nurse called daughter to inform of pt being discharged. No answer. Left voicemail.

## 2022-02-10 ENCOUNTER — Encounter (HOSPITAL_COMMUNITY): Payer: Self-pay | Admitting: Nurse Practitioner

## 2022-02-10 ENCOUNTER — Ambulatory Visit (HOSPITAL_COMMUNITY)
Admission: RE | Admit: 2022-02-10 | Discharge: 2022-02-10 | Disposition: A | Discharge status: Home / Self Care | Payer: Medicare Other | Source: Ambulatory Visit | Attending: Nurse Practitioner | Admitting: Nurse Practitioner

## 2022-02-10 VITALS — BP 120/66 | HR 71 | Ht 64.0 in | Wt 119.2 lb

## 2022-02-10 DIAGNOSIS — I4892 Unspecified atrial flutter: Secondary | ICD-10-CM | POA: Diagnosis not present

## 2022-02-10 DIAGNOSIS — D6869 Other thrombophilia: Secondary | ICD-10-CM

## 2022-02-10 DIAGNOSIS — I4819 Other persistent atrial fibrillation: Secondary | ICD-10-CM | POA: Insufficient documentation

## 2022-02-10 DIAGNOSIS — I480 Paroxysmal atrial fibrillation: Secondary | ICD-10-CM | POA: Insufficient documentation

## 2022-02-10 LAB — BASIC METABOLIC PANEL
Anion gap: 6 (ref 5–15)
BUN: 11 mg/dL (ref 8–23)
CO2: 32 mmol/L (ref 22–32)
Calcium: 8.9 mg/dL (ref 8.9–10.3)
Chloride: 100 mmol/L (ref 98–111)
Creatinine, Ser: 0.76 mg/dL (ref 0.44–1.00)
GFR, Estimated: >60 mL/min (ref >60)
Glucose, Bld: 72 mg/dL (ref 70–99)
Potassium: 3.6 mmol/L (ref 3.5–5.1)
Sodium: 138 mmol/L (ref 135–145)

## 2022-02-10 LAB — CBC
HCT: 38.7 % (ref 36.0–46.0)
Hemoglobin: 12.2 g/dL (ref 12.0–15.0)
MCH: 26.3 pg (ref 26.0–34.0)
MCHC: 31.5 g/dL (ref 30.0–36.0)
MCV: 83.4 fL (ref 80.0–100.0)
Platelets: 299 10*3/uL (ref 150–400)
RBC: 4.64 MIL/uL (ref 3.87–5.11)
RDW: 15.2 % (ref 11.5–15.5)
WBC: 8 10*3/uL (ref 4.0–10.5)
nRBC: 0 % (ref 0.0–0.2)

## 2022-02-10 MED ORDER — AMIODARONE HCL 200 MG PO TABS: ORAL_TABLET | ORAL | Status: DC

## 2022-02-10 NOTE — Patient Instructions (Signed)
Cardioversion scheduled for Wednesday, October 25th  - Arrive at the [facility] and go to admitting at [facility] [place] not eat or drink anything after midnight the night prior to your procedure.  - Take all your morning medication (except diabetic medications) with a sip of water prior to arrival.  - You will not be able to drive home after your procedure.  - Do NOT miss any doses of your blood thinner - if you should miss a dose please notify our office immediately.  - If you feel as if you go back into normal rhythm prior to scheduled cardioversion, please notify our office immediately. If your procedure is canceled in the cardioversion suite you will be charged a cancellation fee.

## 2022-02-10 NOTE — Addendum Note (Signed)
Encounter addended by: Enid Derry, CMA on: 02/10/2022 4:18 PM  Actions taken: Order list changed

## 2022-02-10 NOTE — H&P (View-Only) (Signed)
Primary Care Physician: [name], MD Referring Physician: [facility] f/u Cardiologist: Lenna Sciara, MD/ Sande Rives, PA   Geri Marisa Nunez is a 76 y.o. female with a h/o dementia, CVA, acute HF, HTN, HLD, paroxysmal A-fib on Eliquis, tobacco use, and right breast cancer with chemo admitted in August for acute on chronic heart failure exacerbation 2/2 new EF reduction and a-fib.  Pt had shortness of breath for the last week leading up to ED visit. She was seen by PCP in July and had increased leg swelling and started on diuretic. Was previously followed by cardiology in [place]. She had previously been on lasix, unsure why it was stopped. Echo showed EF of 40 to 45%.RV systolic function is mildly reduced. LA moderate dilated. Moderate MVR, suspect function from LA dilation.Losartan 25 mg started with  new reduction in EF.  Metoprolol succinate increased to 75 mg. She was discharged in rate controlled afib.   She was seen in the ED 10/6 with dizziness. She was noted to be in afib with v rate around 100 bpm. She was seen in cardiology and started on a amiodarone load in August but has not had a f/u visit. She had a visit scheduled in September but had to miss it for a visit back to [place] for a death. The pt is here with her daughter whom she lives with. Her daughter pours her meds and supervises her taking the meds. She states no missed doses of eliquis for the last 3 weeks. The history  comes mostly from the daughter today but the patient answers appropriately when asked a question.   Today, she denies symptoms of palpitations, chest pain, shortness of breath, orthopnea, PND, lower extremity edema, dizziness, presyncope, syncope, or neurologic sequela. The patient is tolerating medications without difficulties and is otherwise without complaint today.   Past Medical History:  Diagnosis Date   Anxiety    Breast cancer ([facility])    Chronic combined systolic and diastolic CHF (congestive heart  failure) ([facility])    a. Echo 11/2021: LVEF of 40-45% with global hypokinesis, mildly reduced RV systolic function, and moderate MR   COPD (chronic obstructive pulmonary disease) ([facility])    Dementia ([facility])    Depression    HTN (hypertension)    Paroxysmal atrial fibrillation ([place]) 2017   Stroke [facility])    Past Surgical History:  Procedure Laterality Date   ABDOMINAL HYSTERECTOMY     REVERSE SHOULDER ARTHROPLASTY Right 10/21/2021   Procedure: RIGHT REVERSE TOTAL SHOULDER ARTHROPLASTY;  Surgeon: Hiram Gash, MD;  Location: [facility];  Service: Orthopedics;  Laterality: Right;  allen bed, spider, tornier (rep aware), open shoulder tray.    Current Outpatient Medications  Medication Sig Dispense Refill   spironolactone (ALDACTONE) 25 MG tablet Take 0.5 tablets (12.5 mg total) by mouth daily. 90 tablet 3   acetaminophen (TYLENOL) 500 MG tablet Take 1 tablet (500 mg total) by mouth every 6 (six) hours as needed for moderate pain, fever or mild pain. 30 tablet 0   albuterol (VENTOLIN HFA) 108 (90 Base) MCG/ACT inhaler Inhale 2 puffs into the lungs every 4 (four) hours as needed for wheezing or shortness of breath.     amiodarone (PACERONE) 200 MG tablet TAKE 2 TABLETS BY MOUTH DAILY FOR 2 WEEKS, THEN START TAKING 1 TABLET DAILY 45 tablet 10   apixaban (ELIQUIS) 5 MG TABS tablet Take 1 tablet (5 mg total) by mouth 2 (two) times daily. 120 tablet 2   aspirin EC 81 MG tablet  Take 81 mg by mouth daily. Swallow whole.     atorvastatin (LIPITOR) 80 MG tablet TAKE 1 TABLET(80 MG) BY MOUTH DAILY 90 tablet 3   buPROPion (WELLBUTRIN) 75 MG tablet Take 1 tablet by mouth twice daily. 60 tablet 0   furosemide (LASIX) 20 MG tablet Take 2 tablets (40 mg total) by mouth daily. 180 tablet 3   losartan (COZAAR) 50 MG tablet Take 0.5 tablets (25 mg total) by mouth daily. 90 tablet 3   memantine (NAMENDA) 10 MG tablet Take 1 tablet by mouth in the morning. (Patient taking differently: Take 20 mg by mouth daily.) 90 tablet 0    metoprolol succinate (TOPROL-XL) 25 MG 24 hr tablet Take 3 tablets (75 mg total) by mouth daily. 90 tablet 0   Multiple Vitamins-Minerals (MULTIVITAMIN WITH MINERALS) tablet Take 1 tablet by mouth daily.     potassium chloride (KLOR-CON) 10 MEQ tablet Take 1 tablet by mouth daily. 30 tablet 0   sertraline (ZOLOFT) 50 MG tablet Take 1 tablet by mouth daily. 90 tablet 0   No current facility-administered medications for this encounter.    Allergies  Allergen Reactions   Influenza Virus Vaccine Hives   Myrbetriq [Mirabegron Er]     Confusion, agitation, hallucination    Pneumococcal Vaccine Hives   Ativan [Lorazepam]     Makes pt agitated per daughter     Social History   Socioeconomic History   Marital status: Single    Spouse name: Not on file   Number of children: Not on file   Years of education: Not on file   Highest education level: Not on file  Occupational History   Not on file  Tobacco Use   Smoking status: Former    Packs/day: 0.50    Types: Cigarettes   Smokeless tobacco: Not on file  Vaping Use   Vaping Use: Never used  Substance and Sexual Activity   Alcohol use: Yes    Alcohol/week: 6.0 standard drinks of alcohol    Types: 6 Cans of beer per week   Drug use: Not Currently   Sexual activity: Yes  Other Topics Concern   Not on file  Social History Narrative   Not on file   Social Determinants of Health   Financial Resource Strain: Low Risk  (11/20/2021)   Overall Financial Resource Strain (CARDIA)    Difficulty of Paying Living Expenses: Not hard at all  Food Insecurity: No Food Insecurity (11/20/2021)   Hunger Vital Sign    Worried About Running Out of Food in the Last Year: Never true    Ran Out of Food in the Last Year: Never true  Transportation Needs: No Transportation Needs (11/20/2021)   PRAPARE - Hydrologist (Medical): No    Lack of Transportation (Non-Medical): No  Physical Activity: Inactive (11/20/2021)    Exercise Vital Sign    Days of Exercise per Week: 0 days    Minutes of Exercise per Session: 0 min  Stress: No Stress Concern Present (11/20/2021)   [place]    Feeling of Stress : Not at all  Social Connections: Socially Isolated (11/20/2021)   Social Connection and Isolation Panel [NHANES]    Frequency of Communication with Friends and Family: More than three times a week    Frequency of Social Gatherings with Friends and Family: Twice a week    Attends Religious Services: Never    Retail buyer of [employer]  or Organizations: No    Attends Archivist Meetings: Never    Marital Status: Divorced  Human resources officer Violence: Not At Risk (11/20/2021)   Humiliation, Afraid, Rape, and Kick questionnaire    Fear of Current or Ex-Partner: No    Emotionally Abused: No    Physically Abused: No    Sexually Abused: No    Family History  Problem Relation Age of Onset   Arrhythmia Father     ROS- All systems are reviewed and negative except as per the HPI above  Physical Exam: Vitals:   02/10/22 0829  Height: '5\' 4"'$  (1.626 m)   Wt Readings from Last 3 Encounters:  01/31/22 53.1 kg  01/16/22 53.1 kg  12/05/21 55 kg    Labs: Lab Results  Component Value Date   NA 136 01/31/2022   K 3.5 01/31/2022   CL 100 01/31/2022   CO2 29 01/31/2022   GLUCOSE 104 (H) 01/31/2022   BUN 11 01/31/2022   CREATININE 0.66 01/31/2022   CALCIUM 8.9 01/31/2022   MG 2.0 01/31/2022   Lab Results  Component Value Date   INR 1.2 10/15/2021   Lab Results  Component Value Date   CHOL 202 (H) 10/30/2020   HDL 88 10/30/2020   LDLCALC 100 (H) 10/30/2020   TRIG 70 10/30/2020     GEN- The patient is well appearing, alert and oriented x 3 today.   Head- normocephalic, atraumatic Eyes-  Sclera clear, conjunctiva pink Ears- hearing intact Oropharynx- clear Neck- supple, no JVP Lymph- no cervical lymphadenopathy Lungs- Clear to  ausculation bilaterally, normal work of breathing Heart- Regular rate and rhythm, no murmurs, rubs or gallops, PMI not laterally displaced GI- soft, NT, ND, + BS Extremities- no clubbing, cyanosis, or edema MS- no significant deformity or atrophy Skin- no rash or lesion Psych- euthymic mood, full affect Neuro- strength and sensation are intact  EKG-Vent. rate 71 BPM PR interval * ms QRS duration 82 ms QT/QTcB 410/445 ms P-R-T axes * 21 -23 Atrial fibrillation Nonspecific ST abnormality Abnormal ECG When compared with ECG of 31-Jan-2022 12:17, PREVIOUS ECG IS PRESENT  Echo- 1. Left ventricular ejection fraction, by estimation, is 40 to 45%. The  left ventricle has mildly decreased function. The left ventricle  demonstrates global hypokinesis. Left ventricular diastolic parameters are  indeterminate.   2. Right ventricular systolic function is mildly reduced. The right  ventricular size is normal. There is normal pulmonary artery systolic  pressure. The estimated right ventricular systolic pressure is 42.7 mmHg.   3. Left atrial size was moderately dilated.   4. The mitral valve is degenerative. Moderate mitral valve regurgitation,  suspect atrial functional MR. No evidence of mitral stenosis. Moderate  mitral annular calcification.   5. The aortic valve is tricuspid. There is moderate calcification of the  aortic valve. Aortic valve regurgitation is not visualized. Aortic valve  sclerosis/calcification is present, without any evidence of aortic  stenosis.   6. The inferior vena cava is normal in size with greater than 50%  respiratory variability, suggesting right atrial pressure of 3 mmHg.   7. The patient is in atrial fibrillation.   Assessment and Plan: 1. Afib  Has been paroxysmal for some time but has become persistent over the last several months.  She has had more shortness of breath with exertion/edema and several ED visits for shortness of breath/dizziness I think  it will improve her LV function and CHF symptoms to restore her to SR She has been on  amiodarone now for around 2 months, missed f/u in cardiology in September Continue amiodarone 200 mg daily  I discussed pursing cardioversion with the daughter and pt and they would like to pursue I discussed risk vrs benefit and importance not to miss any anticoagulation to minimize chance of stroke   Bmet/cbc today   2. HTN BP stable   3. Chronic combined CHF with mildly reduced EF Appears normovolemic  Continue losartan, metoprolol,spironolactone,furosemide   4. CHA2DS2VASc  score of at least 6 Continue eliquis 5 mg bid  Daughter who prepares  and supervises her mother taking meds, states no missed eliquis for at least 3 weeks  I will see one week f/u after cardioversion then return to general cardiology for further monitoring    Butch Penny C. Larya Charpentier, [facility] [address] [place], [place] [address] [phone]

## 2022-02-10 NOTE — Progress Notes (Signed)
Primary Care Physician: [name], MD Referring Physician: [facility] f/u Cardiologist: Lenna Sciara, MD/ Sande Rives, PA   Marisa Nunez is a 76 y.o. female with a h/o dementia, CVA, acute HF, HTN, HLD, paroxysmal A-fib on Eliquis, tobacco use, and right breast cancer with chemo admitted in August for acute on chronic heart failure exacerbation 2/2 new EF reduction and a-fib.  Pt had shortness of breath for the last week leading up to ED visit. She was seen by PCP in July and had increased leg swelling and started on diuretic. Was previously followed by cardiology in [place]. She had previously been on lasix, unsure why it was stopped. Echo showed EF of 40 to 45%.RV systolic function is mildly reduced. LA moderate dilated. Moderate MVR, suspect function from LA dilation.Losartan 25 mg started with  new reduction in EF.  Metoprolol succinate increased to 75 mg. She was discharged in rate controlled afib.   She was seen in the ED 10/6 with dizziness. She was noted to be in afib with v rate around 100 bpm. She was seen in cardiology and started on a amiodarone load in August but has not had a f/u visit. She had a visit scheduled in September but had to miss it for a visit back to [place] for a death. The pt is here with her daughter whom she lives with. Her daughter pours her meds and supervises her taking the meds. She states no missed doses of eliquis for the last 3 weeks. The history  comes mostly from the daughter today but the patient answers appropriately when asked a question.   Today, she denies symptoms of palpitations, chest pain, shortness of breath, orthopnea, PND, lower extremity edema, dizziness, presyncope, syncope, or neurologic sequela. The patient is tolerating medications without difficulties and is otherwise without complaint today.   Past Medical History:  Diagnosis Date   Anxiety    Breast cancer ([place])    Chronic combined systolic and diastolic CHF (congestive heart  failure) ([place])    a. Echo 11/2021: LVEF of 40-45% with global hypokinesis, mildly reduced RV systolic function, and moderate MR   COPD (chronic obstructive pulmonary disease) ([facility])    Dementia ([facility])    Depression    HTN (hypertension)    Paroxysmal atrial fibrillation ([place]) 2017   Stroke [facility])    Past Surgical History:  Procedure Laterality Date   ABDOMINAL HYSTERECTOMY     REVERSE SHOULDER ARTHROPLASTY Right 10/21/2021   Procedure: RIGHT REVERSE TOTAL SHOULDER ARTHROPLASTY;  Surgeon: Hiram Gash, MD;  Location: [facility];  Service: Orthopedics;  Laterality: Right;  allen bed, spider, tornier (rep aware), open shoulder tray.    Current Outpatient Medications  Medication Sig Dispense Refill   spironolactone (ALDACTONE) 25 MG tablet Take 0.5 tablets (12.5 mg total) by mouth daily. 90 tablet 3   acetaminophen (TYLENOL) 500 MG tablet Take 1 tablet (500 mg total) by mouth every 6 (six) hours as needed for moderate pain, fever or mild pain. 30 tablet 0   albuterol (VENTOLIN HFA) 108 (90 Base) MCG/ACT inhaler Inhale 2 puffs into the lungs every 4 (four) hours as needed for wheezing or shortness of breath.     amiodarone (PACERONE) 200 MG tablet TAKE 2 TABLETS BY MOUTH DAILY FOR 2 WEEKS, THEN START TAKING 1 TABLET DAILY 45 tablet 10   apixaban (ELIQUIS) 5 MG TABS tablet Take 1 tablet (5 mg total) by mouth 2 (two) times daily. 120 tablet 2   aspirin EC 81 MG tablet  Take 81 mg by mouth daily. Swallow whole.     atorvastatin (LIPITOR) 80 MG tablet TAKE 1 TABLET(80 MG) BY MOUTH DAILY 90 tablet 3   buPROPion (WELLBUTRIN) 75 MG tablet Take 1 tablet by mouth twice daily. 60 tablet 0   furosemide (LASIX) 20 MG tablet Take 2 tablets (40 mg total) by mouth daily. 180 tablet 3   losartan (COZAAR) 50 MG tablet Take 0.5 tablets (25 mg total) by mouth daily. 90 tablet 3   memantine (NAMENDA) 10 MG tablet Take 1 tablet by mouth in the morning. (Patient taking differently: Take 20 mg by mouth daily.) 90 tablet 0    metoprolol succinate (TOPROL-XL) 25 MG 24 hr tablet Take 3 tablets (75 mg total) by mouth daily. 90 tablet 0   Multiple Vitamins-Minerals (MULTIVITAMIN WITH MINERALS) tablet Take 1 tablet by mouth daily.     potassium chloride (KLOR-CON) 10 MEQ tablet Take 1 tablet by mouth daily. 30 tablet 0   sertraline (ZOLOFT) 50 MG tablet Take 1 tablet by mouth daily. 90 tablet 0   No current facility-administered medications for this encounter.    Allergies  Allergen Reactions   Influenza Virus Vaccine Hives   Myrbetriq [Mirabegron Er]     Confusion, agitation, hallucination    Pneumococcal Vaccine Hives   Ativan [Lorazepam]     Makes pt agitated per daughter     Social History   Socioeconomic History   Marital status: Single    Spouse name: Not on file   Number of children: Not on file   Years of education: Not on file   Highest education level: Not on file  Occupational History   Not on file  Tobacco Use   Smoking status: Former    Packs/day: 0.50    Types: Cigarettes   Smokeless tobacco: Not on file  Vaping Use   Vaping Use: Never used  Substance and Sexual Activity   Alcohol use: Yes    Alcohol/week: 6.0 standard drinks of alcohol    Types: 6 Cans of beer per week   Drug use: Not Currently   Sexual activity: Yes  Other Topics Concern   Not on file  Social History Narrative   Not on file   Social Determinants of Health   Financial Resource Strain: Low Risk  (11/20/2021)   Overall Financial Resource Strain (CARDIA)    Difficulty of Paying Living Expenses: Not hard at all  Food Insecurity: No Food Insecurity (11/20/2021)   Hunger Vital Sign    Worried About Running Out of Food in the Last Year: Never true    Ran Out of Food in the Last Year: Never true  Transportation Needs: No Transportation Needs (11/20/2021)   PRAPARE - Hydrologist (Medical): No    Lack of Transportation (Non-Medical): No  Physical Activity: Inactive (11/20/2021)    Exercise Vital Sign    Days of Exercise per Week: 0 days    Minutes of Exercise per Session: 0 min  Stress: No Stress Concern Present (11/20/2021)   [place]    Feeling of Stress : Not at all  Social Connections: Socially Isolated (11/20/2021)   Social Connection and Isolation Panel [NHANES]    Frequency of Communication with Friends and Family: More than three times a week    Frequency of Social Gatherings with Friends and Family: Twice a week    Attends Religious Services: Never    Retail buyer of [employer]  or Organizations: No    Attends Archivist Meetings: Never    Marital Status: Divorced  Human resources officer Violence: Not At Risk (11/20/2021)   Humiliation, Afraid, Rape, and Kick questionnaire    Fear of Current or Ex-Partner: No    Emotionally Abused: No    Physically Abused: No    Sexually Abused: No    Family History  Problem Relation Age of Onset   Arrhythmia Father     ROS- All systems are reviewed and negative except as per the HPI above  Physical Exam: Vitals:   02/10/22 0829  Height: '5\' 4"'$  (1.626 m)   Wt Readings from Last 3 Encounters:  01/31/22 53.1 kg  01/16/22 53.1 kg  12/05/21 55 kg    Labs: Lab Results  Component Value Date   NA 136 01/31/2022   K 3.5 01/31/2022   CL 100 01/31/2022   CO2 29 01/31/2022   GLUCOSE 104 (H) 01/31/2022   BUN 11 01/31/2022   CREATININE 0.66 01/31/2022   CALCIUM 8.9 01/31/2022   MG 2.0 01/31/2022   Lab Results  Component Value Date   INR 1.2 10/15/2021   Lab Results  Component Value Date   CHOL 202 (H) 10/30/2020   HDL 88 10/30/2020   LDLCALC 100 (H) 10/30/2020   TRIG 70 10/30/2020     GEN- The patient is well appearing, alert and oriented x 3 today.   Head- normocephalic, atraumatic Eyes-  Sclera clear, conjunctiva pink Ears- hearing intact Oropharynx- clear Neck- supple, no JVP Lymph- no cervical lymphadenopathy Lungs- Clear to  ausculation bilaterally, normal work of breathing Heart- Regular rate and rhythm, no murmurs, rubs or gallops, PMI not laterally displaced GI- soft, NT, ND, + BS Extremities- no clubbing, cyanosis, or edema MS- no significant deformity or atrophy Skin- no rash or lesion Psych- euthymic mood, full affect Neuro- strength and sensation are intact  EKG-Vent. rate 71 BPM PR interval * ms QRS duration 82 ms QT/QTcB 410/445 ms P-R-T axes * 21 -23 Atrial fibrillation Nonspecific ST abnormality Abnormal ECG When compared with ECG of 31-Jan-2022 12:17, PREVIOUS ECG IS PRESENT  Echo- 1. Left ventricular ejection fraction, by estimation, is 40 to 45%. The  left ventricle has mildly decreased function. The left ventricle  demonstrates global hypokinesis. Left ventricular diastolic parameters are  indeterminate.   2. Right ventricular systolic function is mildly reduced. The right  ventricular size is normal. There is normal pulmonary artery systolic  pressure. The estimated right ventricular systolic pressure is 19.5 mmHg.   3. Left atrial size was moderately dilated.   4. The mitral valve is degenerative. Moderate mitral valve regurgitation,  suspect atrial functional MR. No evidence of mitral stenosis. Moderate  mitral annular calcification.   5. The aortic valve is tricuspid. There is moderate calcification of the  aortic valve. Aortic valve regurgitation is not visualized. Aortic valve  sclerosis/calcification is present, without any evidence of aortic  stenosis.   6. The inferior vena cava is normal in size with greater than 50%  respiratory variability, suggesting right atrial pressure of 3 mmHg.   7. The patient is in atrial fibrillation.   Assessment and Plan: 1. Afib  Has been paroxysmal for some time but has become persistent over the last several months.  She has had more shortness of breath with exertion/edema and several ED visits for shortness of breath/dizziness I think  it will improve her LV function and CHF symptoms to restore her to SR She has been on  amiodarone now for around 2 months, missed f/u in cardiology in September Continue amiodarone 200 mg daily  I discussed pursing cardioversion with the daughter and pt and they would like to pursue I discussed risk vrs benefit and importance not to miss any anticoagulation to minimize chance of stroke   Bmet/cbc today   2. HTN BP stable   3. Chronic combined CHF with mildly reduced EF Appears normovolemic  Continue losartan, metoprolol,spironolactone,furosemide   4. CHA2DS2VASc  score of at least 6 Continue eliquis 5 mg bid  Daughter who prepares  and supervises her mother taking meds, states no missed eliquis for at least 3 weeks  I will see one week f/u after cardioversion then return to general cardiology for further monitoring    Butch Penny C. Hung Rhinesmith, [facility] [address] [place], [place] [address] [phone]

## 2022-02-11 ENCOUNTER — Other Ambulatory Visit: Payer: Self-pay | Admitting: Student

## 2022-02-18 ENCOUNTER — Telehealth (HOSPITAL_COMMUNITY): Payer: Self-pay

## 2022-02-18 NOTE — Telephone Encounter (Signed)
Patient daughter called stating that she wanted her to be admitted for DCCV for 10/25 because patient isn't understanding that she needs a Cardioversion. Daughter stated that she is being aggressive. Informed daughter that we don't admit for DCCV that she could bring her to the ED and they may do the procedure then. Daughter disconnected line.

## 2022-02-19 ENCOUNTER — Ambulatory Visit (HOSPITAL_BASED_OUTPATIENT_CLINIC_OR_DEPARTMENT_OTHER): Payer: Medicare Other | Admitting: Certified Registered Nurse Anesthetist

## 2022-02-19 ENCOUNTER — Ambulatory Visit (HOSPITAL_COMMUNITY): Payer: Medicare Other | Admitting: Certified Registered Nurse Anesthetist

## 2022-02-19 ENCOUNTER — Ambulatory Visit (HOSPITAL_COMMUNITY)
Admission: RE | Admit: 2022-02-19 | Discharge: 2022-02-19 | Disposition: A | Discharge status: Home / Self Care | Payer: Medicare Other | Source: Ambulatory Visit | Attending: Cardiology | Admitting: Cardiology

## 2022-02-19 ENCOUNTER — Encounter (HOSPITAL_COMMUNITY): Payer: Self-pay | Admitting: Cardiology

## 2022-02-19 ENCOUNTER — Other Ambulatory Visit: Payer: Self-pay

## 2022-02-19 ENCOUNTER — Encounter (HOSPITAL_COMMUNITY)
Admission: RE | Disposition: A | Discharge status: Home / Self Care | Payer: Self-pay | Source: Ambulatory Visit | Attending: Cardiology

## 2022-02-19 DIAGNOSIS — J449 Chronic obstructive pulmonary disease, unspecified: Secondary | ICD-10-CM | POA: Insufficient documentation

## 2022-02-19 DIAGNOSIS — I509 Heart failure, unspecified: Secondary | ICD-10-CM | POA: Diagnosis not present

## 2022-02-19 DIAGNOSIS — I358 Other nonrheumatic aortic valve disorders: Secondary | ICD-10-CM | POA: Insufficient documentation

## 2022-02-19 DIAGNOSIS — I480 Paroxysmal atrial fibrillation: Secondary | ICD-10-CM | POA: Insufficient documentation

## 2022-02-19 DIAGNOSIS — Z87891 Personal history of nicotine dependence: Secondary | ICD-10-CM | POA: Insufficient documentation

## 2022-02-19 DIAGNOSIS — I4819 Other persistent atrial fibrillation: Secondary | ICD-10-CM | POA: Diagnosis not present

## 2022-02-19 DIAGNOSIS — I5042 Chronic combined systolic (congestive) and diastolic (congestive) heart failure: Secondary | ICD-10-CM | POA: Insufficient documentation

## 2022-02-19 DIAGNOSIS — I4891 Unspecified atrial fibrillation: Secondary | ICD-10-CM

## 2022-02-19 DIAGNOSIS — F32A Depression, unspecified: Secondary | ICD-10-CM | POA: Diagnosis not present

## 2022-02-19 DIAGNOSIS — F419 Anxiety disorder, unspecified: Secondary | ICD-10-CM | POA: Insufficient documentation

## 2022-02-19 DIAGNOSIS — I110 Hypertensive heart disease with heart failure: Secondary | ICD-10-CM | POA: Insufficient documentation

## 2022-02-19 DIAGNOSIS — Z7901 Long term (current) use of anticoagulants: Secondary | ICD-10-CM | POA: Diagnosis not present

## 2022-02-19 DIAGNOSIS — F17210 Nicotine dependence, cigarettes, uncomplicated: Secondary | ICD-10-CM

## 2022-02-19 HISTORY — PX: CARDIOVERSION: SHX1299

## 2022-02-19 SURGERY — CARDIOVERSION
Anesthesia: Monitor Anesthesia Care

## 2022-02-19 MED ORDER — PROPOFOL 10 MG/ML IV BOLUS
INTRAVENOUS | Status: DC | PRN
  Administered 2022-02-19: 50 mg via INTRAVENOUS

## 2022-02-19 MED ORDER — LIDOCAINE 2% (20 MG/ML) 5 ML SYRINGE
INTRAMUSCULAR | Status: DC | PRN
  Administered 2022-02-19: 20 mg via INTRAVENOUS

## 2022-02-19 MED ORDER — SODIUM CHLORIDE 0.9 % IV SOLN: INTRAVENOUS | Status: DC

## 2022-02-19 NOTE — CV Procedure (Signed)
Procedure: Electrical Cardioversion Indications:  Atrial Fibrillation  Procedure Details:  Consent: Risks of procedure as well as the alternatives and risks of each were explained to the (patient/caregiver).  Consent for procedure obtained.  Time Out: Verified patient identification, verified procedure, site/side was marked, verified correct patient position, special equipment/implants available, medications/allergies/relevent history reviewed, required imaging and test results available. PERFORMED.  Patient placed on cardiac monitor, pulse oximetry, supplemental oxygen as necessary.  Sedation given:  Propofol '50mg'$ ; lidocaine '20mg'$  Pacer pads placed anterior and posterior chest.  Cardioverted 1 time(s).  Cardioversion with synchronized biphasic 150J shock.  Evaluation: Findings: Post procedure EKG shows:  Sinus bradycardia with PACs Complications: None Patient did tolerate procedure well.  Time Spent Directly with the Patient:  37mnutes   HFreada Bergeron10/25/2023, 8:23 AM

## 2022-02-19 NOTE — Interval H&P Note (Signed)
History and Physical Interval Note:  02/19/2022 8:13 AM  Marisa Nunez  has presented today for surgery, with the diagnosis of AFIB.  The various methods of treatment have been discussed with the patient and family. After consideration of risks, benefits and other options for treatment, the patient has consented to  Procedure(s): CARDIOVERSION (N/A) as a surgical intervention.  The patient's history has been reviewed, patient examined, no change in status, stable for surgery.  I have reviewed the patient's chart and labs.  Questions were answered to the patient's satisfaction.     Freada Bergeron

## 2022-02-19 NOTE — Anesthesia Preprocedure Evaluation (Signed)
Anesthesia Evaluation  Patient identified by MRN, date of birth, ID band Patient awake    Reviewed: Allergy & Precautions, Patient's Chart, lab work & pertinent test results  Airway Mallampati: II  TM Distance: >3 FB Neck ROM: Full    Dental no notable dental hx.    Pulmonary COPD, Current Smoker, former smoker,    Pulmonary exam normal breath sounds clear to auscultation       Cardiovascular hypertension, Pt. on medications +CHF (grade 1 diastolic dysfunction)  Normal cardiovascular exam+ dysrhythmias (eliquis) Atrial Fibrillation + Valvular Problems/Murmurs (mild MR) MR  Rhythm:Regular Rate:Normal  Echo 2022 1. Left ventricular ejection fraction, by estimation, is 55 to 60%. The left ventricle has normal function. The left ventricle has no regional wall motion abnormalities. Left ventricular diastolic parameters are consistent with Grade I diastolic dysfunction (impaired relaxation).  2. Right ventricular systolic function is normal. The right ventricular size is normal.  3. The mitral valve is normal in structure. Mild mitral valve  regurgitation. No evidence of mitral stenosis.  4. The aortic valve is tricuspid. There is mild calcification of the aortic valve. There is mild thickening of the aortic valve. Aortic valve regurgitation is not visualized. No aortic stenosis is present.  5. The inferior vena cava is normal in size with greater than 50% respiratory variability, suggesting right atrial pressure of 3 mmHg.    Neuro/Psych PSYCHIATRIC DISORDERS Anxiety Depression Dementia CVA, No Residual Symptoms    GI/Hepatic negative GI ROS, Neg liver ROS,   Endo/Other  negative endocrine ROS  Renal/GU negative Renal ROS  negative genitourinary   Musculoskeletal R shoulder chronic dislocation    Abdominal   Peds  Hematology negative hematology ROS (+) Hb 12.8   Anesthesia Other Findings    Reproductive/Obstetrics negative OB ROS                             Anesthesia Physical  Anesthesia Plan  ASA: 3  Anesthesia Plan: General   Post-op Pain Management:    Induction: Intravenous  PONV Risk Score and Plan: 2 and Treatment may vary due to age or medical condition  Airway Management Planned: Natural Airway and Mask  Additional Equipment: None  Intra-op Plan:   Post-operative Plan:   Informed Consent: I have reviewed the patients History and Physical, chart, labs and discussed the procedure including the risks, benefits and alternatives for the proposed anesthesia with the patient or authorized representative who has indicated his/her understanding and acceptance.     Dental advisory given  Plan Discussed with: CRNA  Anesthesia Plan Comments:         Anesthesia Quick Evaluation

## 2022-02-19 NOTE — Anesthesia Postprocedure Evaluation (Signed)
Anesthesia Post Note  Patient: Marisa Nunez  Procedure(s) Performed: CARDIOVERSION     Patient location during evaluation: PACU Anesthesia Type: General Level of consciousness: awake and alert Pain management: pain level controlled Vital Signs Assessment: post-procedure vital signs reviewed and stable Respiratory status: spontaneous breathing, nonlabored ventilation, respiratory function stable and patient connected to nasal cannula oxygen Cardiovascular status: blood pressure returned to baseline and stable Postop Assessment: no apparent nausea or vomiting Anesthetic complications: no   No notable events documented.  Last Vitals:  Vitals:   02/19/22 0835 02/19/22 0845  BP: 100/61 107/71  Pulse: (!) 52 (!) 51  Resp: 14 16  Temp:    SpO2: 98% 93%    Last Pain:  Vitals:   02/19/22 0845  TempSrc:   PainSc: 0-No pain                 Tiajuana Amass

## 2022-02-19 NOTE — Discharge Instructions (Signed)

## 2022-02-19 NOTE — Transfer of Care (Signed)
Immediate Anesthesia Transfer of Care Note  Patient: Marisa Nunez  Procedure(s) Performed: CARDIOVERSION  Patient Location: Endoscopy Unit  Anesthesia Type:General  Level of Consciousness: drowsy and patient cooperative  Airway & Oxygen Therapy: Patient Spontanous Breathing  Post-op Assessment: Report given to RN and Post -op Vital signs reviewed and stable  Post vital signs: Reviewed and stable  Last Vitals:  Vitals Value Taken Time  BP 128/64   Temp    Pulse 54   Resp 13   SpO2 100%     Last Pain:  Vitals:   02/19/22 0740  TempSrc: Temporal  PainSc: 0-No pain         Complications: No notable events documented.

## 2022-02-21 ENCOUNTER — Encounter (HOSPITAL_COMMUNITY): Payer: Self-pay | Admitting: Cardiology

## 2022-02-27 ENCOUNTER — Ambulatory Visit (HOSPITAL_COMMUNITY)
Admission: RE | Admit: 2022-02-27 | Discharge: 2022-02-27 | Disposition: A | Discharge status: Home / Self Care | Payer: Medicare Other | Source: Ambulatory Visit | Attending: Nurse Practitioner | Admitting: Nurse Practitioner

## 2022-02-27 ENCOUNTER — Encounter (HOSPITAL_COMMUNITY): Payer: Self-pay | Admitting: Nurse Practitioner

## 2022-02-27 ENCOUNTER — Other Ambulatory Visit (HOSPITAL_COMMUNITY): Payer: Self-pay | Admitting: *Deleted

## 2022-02-27 VITALS — BP 104/56 | HR 108 | Ht 64.0 in | Wt 115.2 lb

## 2022-02-27 DIAGNOSIS — F0390 Unspecified dementia without behavioral disturbance: Secondary | ICD-10-CM | POA: Insufficient documentation

## 2022-02-27 DIAGNOSIS — Z9221 Personal history of antineoplastic chemotherapy: Secondary | ICD-10-CM | POA: Diagnosis not present

## 2022-02-27 DIAGNOSIS — I480 Paroxysmal atrial fibrillation: Secondary | ICD-10-CM | POA: Insufficient documentation

## 2022-02-27 DIAGNOSIS — I4819 Other persistent atrial fibrillation: Secondary | ICD-10-CM | POA: Diagnosis not present

## 2022-02-27 DIAGNOSIS — E785 Hyperlipidemia, unspecified: Secondary | ICD-10-CM | POA: Diagnosis not present

## 2022-02-27 DIAGNOSIS — I110 Hypertensive heart disease with heart failure: Secondary | ICD-10-CM | POA: Insufficient documentation

## 2022-02-27 DIAGNOSIS — Z8673 Personal history of transient ischemic attack (TIA), and cerebral infarction without residual deficits: Secondary | ICD-10-CM | POA: Diagnosis not present

## 2022-02-27 DIAGNOSIS — I4891 Unspecified atrial fibrillation: Secondary | ICD-10-CM | POA: Diagnosis present

## 2022-02-27 DIAGNOSIS — Z7901 Long term (current) use of anticoagulants: Secondary | ICD-10-CM | POA: Insufficient documentation

## 2022-02-27 DIAGNOSIS — I5042 Chronic combined systolic (congestive) and diastolic (congestive) heart failure: Secondary | ICD-10-CM | POA: Diagnosis not present

## 2022-02-27 DIAGNOSIS — Z87891 Personal history of nicotine dependence: Secondary | ICD-10-CM | POA: Insufficient documentation

## 2022-02-27 DIAGNOSIS — Z853 Personal history of malignant neoplasm of breast: Secondary | ICD-10-CM | POA: Insufficient documentation

## 2022-02-27 DIAGNOSIS — D6869 Other thrombophilia: Secondary | ICD-10-CM

## 2022-02-27 MED ORDER — METOPROLOL SUCCINATE ER 50 MG PO TB24: 50.0000 mg | ORAL_TABLET | Freq: Two times a day (BID) | ORAL | 3 refills | Status: DC

## 2022-02-27 NOTE — Progress Notes (Signed)
Primary Care Physician: [name], MD Referring Physician: [name] f/u Cardiologist: Lenna Sciara, MD/ Sande Rives, PA   Marisa Nunez is a 76 y.o. female with a h/o dementia, CVA, acute HF, HTN, HLD, paroxysmal A-fib on Eliquis, tobacco use, and right breast cancer with chemo admitted in August for acute on chronic heart failure exacerbation 2/2 new EF reduction and a-fib.  Pt had shortness of breath for the last week leading up to ED visit. She was seen by PCP in July and had increased leg swelling and started on diuretic. Was previously followed by cardiology in [place]. She had previously been on lasix, unsure why it was stopped. Echo showed EF of 40 to 45%.RV systolic function is mildly reduced. LA moderate dilated. Moderate MVR, suspect function from LA dilation.Losartan 25 mg started with  new reduction in EF.  Metoprolol succinate increased to 75 mg. She was discharged in rate controlled afib.   She was seen in the ED 10/6 with dizziness. She was noted to be in afib with v rate around 100 bpm. She was seen in cardiology and started on a amiodarone load in August but has not had a f/u visit. She had a visit scheduled in September but had to miss it for a visit back to [place] for a death. The pt is here with her daughter whom she lives with. Her daughter pours her meds and supervises her taking the meds. She states no missed doses of eliquis for the last 3 weeks. The history  comes mostly from the daughter today but the patient answers appropriately when asked a question.   F/u in afib clinic, 11/2. She had a successful cardioversion but unfortunately has returned to afib. She is not symptomatic per daughter. She did see her neurologist and he wants to start her on Seroquel for her dementia which means that amiodarone would have to be stopped. Since it is not holding her in rhythm, I see no reason to continue her on this drug. Tikosyn would not be compatible with Seroquel as well and I  worry re use of Tikosyn with her dementia. I asked Dr. Curt Bears re ablation and he is not in favor with dementia dx as well. He felt rate control going forward would probably be her best treatment going forward.   Today, she denies symptoms of palpitations, chest pain, shortness of breath, orthopnea, PND, lower extremity edema, dizziness, presyncope, syncope, or neurologic sequela. The patient is tolerating medications without difficulties and is otherwise without complaint today.   Past Medical History:  Diagnosis Date   Anxiety    Breast cancer ([place])    Chronic combined systolic and diastolic CHF (congestive heart failure) ([place])    a. Echo 11/2021: LVEF of 40-45% with global hypokinesis, mildly reduced RV systolic function, and moderate MR   COPD (chronic obstructive pulmonary disease) ([facility])    Dementia ([facility])    Depression    HTN (hypertension)    Paroxysmal atrial fibrillation ([place]) 2017   Stroke [facility])    Past Surgical History:  Procedure Laterality Date   ABDOMINAL HYSTERECTOMY     CARDIOVERSION N/A 02/19/2022   Procedure: CARDIOVERSION;  Surgeon: Freada Bergeron, MD;  Location: [place];  Service: Cardiovascular;  Laterality: N/A;   REVERSE SHOULDER ARTHROPLASTY Right 10/21/2021   Procedure: RIGHT REVERSE TOTAL SHOULDER ARTHROPLASTY;  Surgeon: Hiram Gash, MD;  Location: [place];  Service: Orthopedics;  Laterality: Right;  allen bed, spider, tornier (rep aware), open shoulder tray.  Current Outpatient Medications  Medication Sig Dispense Refill   acetaminophen (TYLENOL) 500 MG tablet Take 1 tablet (500 mg total) by mouth every 6 (six) hours as needed for moderate pain, fever or mild pain. 30 tablet 0   albuterol (VENTOLIN HFA) 108 (90 Base) MCG/ACT inhaler Inhale 2 puffs into the lungs every 4 (four) hours as needed for wheezing or shortness of breath.     amiodarone (PACERONE) 200 MG tablet Take 1 tablet by mouth daily     atorvastatin (LIPITOR) 80 MG tablet TAKE 1  TABLET(80 MG) BY MOUTH DAILY 90 tablet 3   buPROPion (WELLBUTRIN) 75 MG tablet Take 1 tablet by mouth twice daily. 60 tablet 0   ELIQUIS 5 MG TABS tablet TAKE 1 TABLET(5 MG) BY MOUTH TWICE DAILY 120 tablet 2   furosemide (LASIX) 20 MG tablet Take 2 tablets (40 mg total) by mouth daily. 90 tablet 3   losartan (COZAAR) 50 MG tablet Take 0.5 tablets (25 mg total) by mouth daily. 90 tablet 3   memantine (NAMENDA) 10 MG tablet Take 10 mg by mouth 2 (two) times daily.     metoprolol succinate (TOPROL-XL) 25 MG 24 hr tablet Take 3 tablets (75 mg total) by mouth daily. 90 tablet 0   Multiple Vitamins-Minerals (MULTIVITAMIN WITH MINERALS) tablet Take 1 tablet by mouth daily.     potassium chloride (KLOR-CON M) 10 MEQ tablet TAKE 1 TABLET(10 MEQ) BY MOUTH TWICE DAILY 90 tablet 2   sertraline (ZOLOFT) 50 MG tablet Take 1 tablet by mouth daily. 90 tablet 0   spironolactone (ALDACTONE) 25 MG tablet Take 0.5 tablets (12.5 mg total) by mouth daily. 90 tablet 3   No current facility-administered medications for this encounter.    Allergies  Allergen Reactions   Influenza Virus Vaccine Hives   Myrbetriq [Mirabegron Er]     Confusion, agitation, hallucination    Pneumococcal Vaccine Hives   Ativan [Lorazepam]     Makes pt agitated per daughter     Social History   Socioeconomic History   Marital status: Single    Spouse name: Not on file   Number of children: Not on file   Years of education: Not on file   Highest education level: Not on file  Occupational History   Not on file  Tobacco Use   Smoking status: Former    Packs/day: 0.50    Types: Cigarettes   Smokeless tobacco: Not on file  Vaping Use   Vaping Use: Never used  Substance and Sexual Activity   Alcohol use: Yes    Alcohol/week: 6.0 standard drinks of alcohol    Types: 6 Cans of beer per week   Drug use: Not Currently   Sexual activity: Yes  Other Topics Concern   Not on file  Social History Narrative   Not on file    Social Determinants of Health   Financial Resource Strain: Low Risk  (11/20/2021)   Overall Financial Resource Strain (CARDIA)    Difficulty of Paying Living Expenses: Not hard at all  Food Insecurity: No Food Insecurity (11/20/2021)   Hunger Vital Sign    Worried About Running Out of Food in the Last Year: Never true    Ran Out of Food in the Last Year: Never true  Transportation Needs: No Transportation Needs (11/20/2021)   PRAPARE - Hydrologist (Medical): No    Lack of Transportation (Non-Medical): No  Physical Activity: Inactive (11/20/2021)   Exercise Vital Sign  Days of Exercise per Week: 0 days    Minutes of Exercise per Session: 0 min  Stress: No Stress Concern Present (11/20/2021)   [place]    Feeling of Stress : Not at all  Social Connections: Socially Isolated (11/20/2021)   Social Connection and Isolation Panel [NHANES]    Frequency of Communication with Friends and Family: More than three times a week    Frequency of Social Gatherings with Friends and Family: Twice a week    Attends Religious Services: Never    Marine scientist or Organizations: No    Attends Archivist Meetings: Never    Marital Status: Divorced  Human resources officer Violence: Not At Risk (11/20/2021)   Humiliation, Afraid, Rape, and Kick questionnaire    Fear of Current or Ex-Partner: No    Emotionally Abused: No    Physically Abused: No    Sexually Abused: No    Family History  Problem Relation Age of Onset   Arrhythmia Father     ROS- All systems are reviewed and negative except as per the HPI above  Physical Exam: Vitals:   02/27/22 1022  BP: (!) 104/56  Pulse: (!) 108  Weight: 52.3 kg  Height: '5\' 4"'$  (1.626 m)   Wt Readings from Last 3 Encounters:  02/27/22 52.3 kg  02/19/22 54.1 kg  02/10/22 54.1 kg    Labs: Lab Results  Component Value Date   NA 138 02/10/2022    K 3.6 02/10/2022   CL 100 02/10/2022   CO2 32 02/10/2022   GLUCOSE 72 02/10/2022   BUN 11 02/10/2022   CREATININE 0.76 02/10/2022   CALCIUM 8.9 02/10/2022   MG 2.0 01/31/2022   Lab Results  Component Value Date   INR 1.2 10/15/2021   Lab Results  Component Value Date   CHOL 202 (H) 10/30/2020   HDL 88 10/30/2020   LDLCALC 100 (H) 10/30/2020   TRIG 70 10/30/2020     GEN- The patient is well appearing, alert and oriented x 3 today.   Head- normocephalic, atraumatic Eyes-  Sclera clear, conjunctiva pink Ears- hearing intact Oropharynx- clear Neck- supple, no JVP Lymph- no cervical lymphadenopathy Lungs- Clear to ausculation bilaterally, normal work of breathing Heart- Regular rate and rhythm, no murmurs, rubs or gallops, PMI not laterally displaced GI- soft, NT, ND, + BS Extremities- no clubbing, cyanosis, or edema MS- no significant deformity or atrophy Skin- no rash or lesion Psych- euthymic mood, full affect Neuro- strength and sensation are intact  EKG-afib at 108 bpm, qrs int 94 ms, qtc 482 ms  Echo- 1. Left ventricular ejection fraction, by estimation, is 40 to 45%. The  left ventricle has mildly decreased function. The left ventricle  demonstrates global hypokinesis. Left ventricular diastolic parameters are  indeterminate.   2. Right ventricular systolic function is mildly reduced. The right  ventricular size is normal. There is normal pulmonary artery systolic  pressure. The estimated right ventricular systolic pressure is 45.8 mmHg.   3. Left atrial size was moderately dilated.   4. The mitral valve is degenerative. Moderate mitral valve regurgitation,  suspect atrial functional MR. No evidence of mitral stenosis. Moderate  mitral annular calcification.   5. The aortic valve is tricuspid. There is moderate calcification of the  aortic valve. Aortic valve regurgitation is not visualized. Aortic valve  sclerosis/calcification is present, without any evidence  of aortic  stenosis.   6. The inferior vena cava is  normal in size with greater than 50%  respiratory variability, suggesting right atrial pressure of 3 mmHg.   7. The patient is in atrial fibrillation.   Assessment and Plan: 1. Afib  Has been paroxysmal for some time but has become persistent over the last several months.  She has had more shortness of breath with exertion/edema and several ED visits for shortness of breath/dizziness She was loaded on amiodarone before DCCV but unfortunately with ERAF Currently in afib she seems fairly asymptomatic The daughter states that she has good energy at home Fluid weight is stable   I discussed with Dr. Chauncey Cruel and Dr. Curt Bears She is not felt to be an ablation candidate for her dx of dementia Her neurologist wants to use Seroquel with her h/o dementia so amiodarone would have to d/c and since it has been ineffective to keep her in SR, this will be stopped I don't favor Tikosyn as well with dementia as well as it is also contraindicated with Seroquel  She is not an ablation candidate with dx of dementia  Stop  amiodarone and increase metoprolol to 50 mg bid     2. HTN BP stable   3. Chronic combined CHF with mildly reduced EF Appears normovolemic  Continue losartan, metoprolol,spironolactone,furosemide   4. CHA2DS2VASc  score of at least 6 Continue eliquis 5 mg bid    The daughter states that she has a letter to arrange f/u with Dr. Woodroe Mode, encouraged her to call for appointment  I will see back in one week for nurse visit with  BP/EKG with change in [place]. Aron Inge, [facility] [address] [place], [place] [address] [phone]

## 2022-02-28 ENCOUNTER — Other Ambulatory Visit (HOSPITAL_COMMUNITY): Payer: Self-pay

## 2022-02-28 MED ORDER — APIXABAN 5 MG PO TABS: ORAL_TABLET | ORAL | 0 refills | Status: DC

## 2022-03-07 ENCOUNTER — Encounter (HOSPITAL_COMMUNITY): Payer: Self-pay

## 2022-03-07 ENCOUNTER — Encounter (HOSPITAL_COMMUNITY): Payer: Medicare Other | Admitting: Physician Assistant

## 2022-04-06 ENCOUNTER — Emergency Department (HOSPITAL_COMMUNITY): Payer: Medicare Other

## 2022-04-06 ENCOUNTER — Inpatient Hospital Stay (HOSPITAL_COMMUNITY)
Admission: EM | Admit: 2022-04-06 | Discharge: 2022-04-08 | DRG: 177 | Disposition: A | Discharge status: Home / Self Care | Payer: Medicare Other | Attending: Internal Medicine | Admitting: Internal Medicine

## 2022-04-06 ENCOUNTER — Other Ambulatory Visit: Payer: Self-pay

## 2022-04-06 ENCOUNTER — Encounter (HOSPITAL_COMMUNITY): Payer: Self-pay

## 2022-04-06 DIAGNOSIS — J440 Chronic obstructive pulmonary disease with acute lower respiratory infection: Secondary | ICD-10-CM | POA: Diagnosis present

## 2022-04-06 DIAGNOSIS — E785 Hyperlipidemia, unspecified: Secondary | ICD-10-CM | POA: Diagnosis present

## 2022-04-06 DIAGNOSIS — Z7901 Long term (current) use of anticoagulants: Secondary | ICD-10-CM

## 2022-04-06 DIAGNOSIS — F0393 Unspecified dementia, unspecified severity, with mood disturbance: Secondary | ICD-10-CM | POA: Diagnosis present

## 2022-04-06 DIAGNOSIS — I110 Hypertensive heart disease with heart failure: Secondary | ICD-10-CM | POA: Diagnosis present

## 2022-04-06 DIAGNOSIS — J180 Bronchopneumonia, unspecified organism: Secondary | ICD-10-CM | POA: Diagnosis present

## 2022-04-06 DIAGNOSIS — J441 Chronic obstructive pulmonary disease with (acute) exacerbation: Secondary | ICD-10-CM | POA: Diagnosis present

## 2022-04-06 DIAGNOSIS — J101 Influenza due to other identified influenza virus with other respiratory manifestations: Secondary | ICD-10-CM

## 2022-04-06 DIAGNOSIS — J1008 Influenza due to other identified influenza virus with other specified pneumonia: Secondary | ICD-10-CM | POA: Diagnosis present

## 2022-04-06 DIAGNOSIS — A419 Sepsis, unspecified organism: Secondary | ICD-10-CM

## 2022-04-06 DIAGNOSIS — R0602 Shortness of breath: Secondary | ICD-10-CM

## 2022-04-06 DIAGNOSIS — Z66 Do not resuscitate: Secondary | ICD-10-CM | POA: Diagnosis present

## 2022-04-06 DIAGNOSIS — Z888 Allergy status to other drugs, medicaments and biological substances status: Secondary | ICD-10-CM

## 2022-04-06 DIAGNOSIS — I4891 Unspecified atrial fibrillation: Secondary | ICD-10-CM | POA: Diagnosis present

## 2022-04-06 DIAGNOSIS — I4819 Other persistent atrial fibrillation: Secondary | ICD-10-CM | POA: Diagnosis present

## 2022-04-06 DIAGNOSIS — U071 COVID-19: Secondary | ICD-10-CM | POA: Diagnosis present

## 2022-04-06 DIAGNOSIS — Z79899 Other long term (current) drug therapy: Secondary | ICD-10-CM

## 2022-04-06 DIAGNOSIS — Z853 Personal history of malignant neoplasm of breast: Secondary | ICD-10-CM

## 2022-04-06 DIAGNOSIS — I5042 Chronic combined systolic (congestive) and diastolic (congestive) heart failure: Secondary | ICD-10-CM | POA: Diagnosis present

## 2022-04-06 DIAGNOSIS — Z9221 Personal history of antineoplastic chemotherapy: Secondary | ICD-10-CM

## 2022-04-06 DIAGNOSIS — J1100 Influenza due to unidentified influenza virus with unspecified type of pneumonia: Secondary | ICD-10-CM | POA: Diagnosis present

## 2022-04-06 LAB — URINALYSIS, ROUTINE W REFLEX MICROSCOPIC
Bacteria, UA: NONE SEEN
Bilirubin Urine: NEGATIVE
Glucose, UA: NEGATIVE mg/dL
Ketones, ur: 5 mg/dL — AB
Leukocytes,Ua: NEGATIVE
Nitrite: NEGATIVE
Protein, ur: NEGATIVE mg/dL
Specific Gravity, Urine: 1.01 (ref 1.005–1.030)
pH: 5 (ref 5.0–8.0)

## 2022-04-06 LAB — COMPREHENSIVE METABOLIC PANEL
ALT: 22 U/L (ref 0–44)
AST: 30 U/L (ref 15–41)
Albumin: 3.5 g/dL (ref 3.5–5.0)
Alkaline Phosphatase: 113 U/L (ref 38–126)
Anion gap: 12 (ref 5–15)
BUN: 10 mg/dL (ref 8–23)
CO2: 22 mmol/L (ref 22–32)
Calcium: 8.9 mg/dL (ref 8.9–10.3)
Chloride: 97 mmol/L — ABNORMAL LOW (ref 98–111)
Creatinine, Ser: 0.73 mg/dL (ref 0.44–1.00)
GFR, Estimated: >60 mL/min (ref >60)
Glucose, Bld: 117 mg/dL — ABNORMAL HIGH (ref 70–99)
Potassium: 3.5 mmol/L (ref 3.5–5.1)
Sodium: 131 mmol/L — ABNORMAL LOW (ref 135–145)
Total Bilirubin: 0.7 mg/dL (ref 0.3–1.2)
Total Protein: 6.7 g/dL (ref 6.5–8.1)

## 2022-04-06 LAB — CBC WITH DIFFERENTIAL/PLATELET
Abs Immature Granulocytes: 0.07 10*3/uL (ref 0.00–0.07)
Basophils Absolute: 0 10*3/uL (ref 0.0–0.1)
Basophils Relative: 0 %
Eosinophils Absolute: 0 10*3/uL (ref 0.0–0.5)
Eosinophils Relative: 0 %
HCT: 34.5 % — ABNORMAL LOW (ref 36.0–46.0)
Hemoglobin: 11.4 g/dL — ABNORMAL LOW (ref 12.0–15.0)
Immature Granulocytes: 1 %
Lymphocytes Relative: 1 %
Lymphs Abs: 0.2 10*3/uL — ABNORMAL LOW (ref 0.7–4.0)
MCH: 27.3 pg (ref 26.0–34.0)
MCHC: 33 g/dL (ref 30.0–36.0)
MCV: 82.5 fL (ref 80.0–100.0)
Monocytes Absolute: 0.5 10*3/uL (ref 0.1–1.0)
Monocytes Relative: 4 %
Neutro Abs: 11 10*3/uL — ABNORMAL HIGH (ref 1.7–7.7)
Neutrophils Relative %: 94 %
Platelets: 274 10*3/uL (ref 150–400)
RBC: 4.18 MIL/uL (ref 3.87–5.11)
RDW: 16.8 % — ABNORMAL HIGH (ref 11.5–15.5)
WBC: 11.8 10*3/uL — ABNORMAL HIGH (ref 4.0–10.5)
nRBC: 0 % (ref 0.0–0.2)

## 2022-04-06 LAB — LACTIC ACID, PLASMA
Lactic Acid, Venous: 1.4 mmol/L (ref 0.5–1.9)
Lactic Acid, Venous: 1.4 mmol/L (ref 0.5–1.9)

## 2022-04-06 LAB — I-STAT VENOUS BLOOD GAS, ED
Acid-Base Excess: 0 mmol/L (ref 0.0–2.0)
Bicarbonate: 24 mmol/L (ref 20.0–28.0)
Calcium, Ion: 1.09 mmol/L — ABNORMAL LOW (ref 1.15–1.40)
HCT: 37 % (ref 36.0–46.0)
Hemoglobin: 12.6 g/dL (ref 12.0–15.0)
O2 Saturation: 78 %
Potassium: 3.5 mmol/L (ref 3.5–5.1)
Sodium: 130 mmol/L — ABNORMAL LOW (ref 135–145)
TCO2: 25 mmol/L (ref 22–32)
pCO2, Ven: 34.8 mmHg — ABNORMAL LOW (ref 44–60)
pH, Ven: 7.446 — ABNORMAL HIGH (ref 7.25–7.43)
pO2, Ven: 40 mmHg (ref 32–45)

## 2022-04-06 LAB — RESP PANEL BY RT-PCR (RSV, FLU A&B, COVID)  RVPGX2
Influenza A by PCR: POSITIVE — AB
Influenza B by PCR: NEGATIVE
Resp Syncytial Virus by PCR: NEGATIVE
SARS Coronavirus 2 by RT PCR: POSITIVE — AB

## 2022-04-06 LAB — TROPONIN I (HIGH SENSITIVITY)
Troponin I (High Sensitivity): 7 ng/L (ref <18)
Troponin I (High Sensitivity): 9 ng/L (ref <18)

## 2022-04-06 LAB — APTT: aPTT: 22 seconds — ABNORMAL LOW (ref 24–36)

## 2022-04-06 LAB — MAGNESIUM: Magnesium: 1.5 mg/dL — ABNORMAL LOW (ref 1.7–2.4)

## 2022-04-06 LAB — PROTIME-INR
INR: 1.1 (ref 0.8–1.2)
Prothrombin Time: 14.3 seconds (ref 11.4–15.2)

## 2022-04-06 LAB — BRAIN NATRIURETIC PEPTIDE: B Natriuretic Peptide: 1150.2 pg/mL — ABNORMAL HIGH (ref 0.0–100.0)

## 2022-04-06 MED ORDER — IPRATROPIUM-ALBUTEROL 0.5-2.5 (3) MG/3ML IN SOLN
3.0000 mL | Freq: Once | RESPIRATORY_TRACT | Status: AC
  Administered 2022-04-06: 3 mL via RESPIRATORY_TRACT
  Filled 2022-04-06: qty 3

## 2022-04-06 MED ORDER — ACETAMINOPHEN 650 MG RE SUPP: 650.0000 mg | Freq: Four times a day (QID) | RECTAL | Status: DC | PRN

## 2022-04-06 MED ORDER — SERTRALINE HCL 50 MG PO TABS
50.0000 mg | ORAL_TABLET | Freq: Every day | ORAL | Status: DC
  Administered 2022-04-07 – 2022-04-08 (×3): 50 mg via ORAL
  Filled 2022-04-06 (×3): qty 1

## 2022-04-06 MED ORDER — QUETIAPINE FUMARATE 25 MG PO TABS
25.0000 mg | ORAL_TABLET | Freq: Once | ORAL | Status: AC
  Administered 2022-04-06: 25 mg via ORAL
  Filled 2022-04-06: qty 1

## 2022-04-06 MED ORDER — ACETAMINOPHEN 500 MG PO TABS
1000.0000 mg | ORAL_TABLET | Freq: Once | ORAL | Status: AC
  Administered 2022-04-06: 1000 mg via ORAL
  Filled 2022-04-06: qty 2

## 2022-04-06 MED ORDER — FUROSEMIDE 40 MG PO TABS
40.0000 mg | ORAL_TABLET | Freq: Every day | ORAL | Status: DC
  Administered 2022-04-07 – 2022-04-08 (×2): 40 mg via ORAL
  Filled 2022-04-06: qty 2
  Filled 2022-04-06: qty 1

## 2022-04-06 MED ORDER — ATORVASTATIN CALCIUM 80 MG PO TABS
80.0000 mg | ORAL_TABLET | Freq: Every day | ORAL | Status: DC
  Administered 2022-04-06 – 2022-04-08 (×3): 80 mg via ORAL
  Filled 2022-04-06: qty 2
  Filled 2022-04-06: qty 1
  Filled 2022-04-06: qty 2

## 2022-04-06 MED ORDER — APIXABAN 5 MG PO TABS
5.0000 mg | ORAL_TABLET | Freq: Two times a day (BID) | ORAL | Status: DC
  Administered 2022-04-06 – 2022-04-08 (×4): 5 mg via ORAL
  Filled 2022-04-06 (×4): qty 1

## 2022-04-06 MED ORDER — ONDANSETRON HCL 4 MG/2ML IJ SOLN: 4.0000 mg | Freq: Four times a day (QID) | INTRAMUSCULAR | Status: DC | PRN

## 2022-04-06 MED ORDER — OSELTAMIVIR PHOSPHATE 75 MG PO CAPS
75.0000 mg | ORAL_CAPSULE | Freq: Once | ORAL | Status: AC
  Administered 2022-04-06: 75 mg via ORAL
  Filled 2022-04-06: qty 1

## 2022-04-06 MED ORDER — ALBUTEROL SULFATE (2.5 MG/3ML) 0.083% IN NEBU
2.5000 mg | INHALATION_SOLUTION | RESPIRATORY_TRACT | Status: DC | PRN
  Administered 2022-04-08: 2.5 mg via RESPIRATORY_TRACT
  Filled 2022-04-06: qty 3

## 2022-04-06 MED ORDER — IOHEXOL 350 MG/ML SOLN
75.0000 mL | Freq: Once | INTRAVENOUS | Status: AC | PRN
  Administered 2022-04-06: 75 mL via INTRAVENOUS

## 2022-04-06 MED ORDER — DILTIAZEM LOAD VIA INFUSION
15.0000 mg | Freq: Once | INTRAVENOUS | Status: AC
  Administered 2022-04-06: 15 mg via INTRAVENOUS
  Filled 2022-04-06: qty 15

## 2022-04-06 MED ORDER — LOSARTAN POTASSIUM 25 MG PO TABS
25.0000 mg | ORAL_TABLET | Freq: Every day | ORAL | Status: DC
  Administered 2022-04-07 – 2022-04-08 (×3): 25 mg via ORAL
  Filled 2022-04-06 (×3): qty 1

## 2022-04-06 MED ORDER — BUPROPION HCL 75 MG PO TABS
75.0000 mg | ORAL_TABLET | Freq: Two times a day (BID) | ORAL | Status: DC
  Administered 2022-04-07 – 2022-04-08 (×4): 75 mg via ORAL
  Filled 2022-04-06 (×5): qty 1

## 2022-04-06 MED ORDER — SODIUM CHLORIDE 0.9 % IV SOLN
2.0000 g | Freq: Once | INTRAVENOUS | Status: AC
  Administered 2022-04-06: 2 g via INTRAVENOUS
  Filled 2022-04-06: qty 12.5

## 2022-04-06 MED ORDER — VANCOMYCIN HCL IN DEXTROSE 1-5 GM/200ML-% IV SOLN
1000.0000 mg | Freq: Once | INTRAVENOUS | Status: AC
  Administered 2022-04-06: 1000 mg via INTRAVENOUS
  Filled 2022-04-06: qty 200

## 2022-04-06 MED ORDER — OLANZAPINE 5 MG PO TBDP
5.0000 mg | ORAL_TABLET | Freq: Once | ORAL | Status: AC
  Administered 2022-04-06: 5 mg via ORAL
  Filled 2022-04-06: qty 1

## 2022-04-06 MED ORDER — DILTIAZEM HCL-DEXTROSE 125-5 MG/125ML-% IV SOLN (PREMIX)
5.0000 mg/h | INTRAVENOUS | Status: DC
  Administered 2022-04-06 (×2): 5 mg/h via INTRAVENOUS
  Administered 2022-04-07: 7.5 mg/h via INTRAVENOUS
  Filled 2022-04-06 (×2): qty 125

## 2022-04-06 MED ORDER — FUROSEMIDE 10 MG/ML IJ SOLN
20.0000 mg | Freq: Once | INTRAMUSCULAR | Status: AC
  Administered 2022-04-06: 20 mg via INTRAVENOUS
  Filled 2022-04-06: qty 2

## 2022-04-06 MED ORDER — DROPERIDOL 2.5 MG/ML IJ SOLN
1.2500 mg | Freq: Once | INTRAMUSCULAR | Status: AC
  Administered 2022-04-06: 1.25 mg via INTRAVENOUS
  Filled 2022-04-06: qty 2

## 2022-04-06 MED ORDER — ALBUTEROL SULFATE HFA 108 (90 BASE) MCG/ACT IN AERS: 1.0000 | INHALATION_SPRAY | RESPIRATORY_TRACT | Status: DC | PRN

## 2022-04-06 MED ORDER — MEMANTINE HCL 10 MG PO TABS
10.0000 mg | ORAL_TABLET | Freq: Two times a day (BID) | ORAL | Status: DC
  Administered 2022-04-07 – 2022-04-08 (×4): 10 mg via ORAL
  Filled 2022-04-06 (×5): qty 1

## 2022-04-06 MED ORDER — ACETAMINOPHEN 325 MG PO TABS
650.0000 mg | ORAL_TABLET | Freq: Four times a day (QID) | ORAL | Status: DC | PRN
  Administered 2022-04-07 (×2): 650 mg via ORAL
  Filled 2022-04-06 (×3): qty 2

## 2022-04-06 MED ORDER — SODIUM CHLORIDE 0.9 % IV BOLUS (SEPSIS)
500.0000 mL | Freq: Once | INTRAVENOUS | Status: AC
  Administered 2022-04-06: 500 mL via INTRAVENOUS

## 2022-04-06 MED ORDER — ONDANSETRON HCL 4 MG PO TABS: 4.0000 mg | ORAL_TABLET | Freq: Four times a day (QID) | ORAL | Status: DC | PRN

## 2022-04-06 NOTE — ED Notes (Signed)
Daughter [name] [phone] would like an update asap

## 2022-04-06 NOTE — Hospital Course (Signed)
Yesterday morning. Good appetite. Dry coughing a lot during the night. Dyspnea this AM. Nausea.  Dementia  Afib  Ran out of albuterol

## 2022-04-06 NOTE — ED Triage Notes (Signed)
Family called EMS states the patient has been short of breath and coughing, states everyone has been sick at home.  In route EMS states patient was in Afib and complained of nausea.

## 2022-04-06 NOTE — ED Notes (Signed)
Pt removed all Ivs, including cardizem drip. Pt found wandering in the halls. IV medication restarted, O2 reapplied, and pt placed back on the monitor. Pt alert to self and doesn't understand why she is here.

## 2022-04-06 NOTE — H&P (Signed)
Date: 04/06/2022               Patient Name:  Marisa Nunez MRN: [id number]  DOB: 18-Apr-1946 Age / Sex: 76 y.o., female   PCP: [name], MD         Medical Service: Internal Medicine Teaching Service         Attending Physician: Dr. Charise Killian, MD    First Contact: Dr. Leigh Aurora, DO  Pager: [phone]  Second Contact: Dr. Idamae Schuller, MD  Pager: [phone]       After Hours (After 5p/  First Contact Pager: [phone]  weekends / holidays): Second Contact Pager: [phone]   Chief Complaint: cough and dyspnea  History of Present Illness:  Marisa Nunez is a 76 y/o female with a pmh of dementia, COPD, combined CHF, persistent afib on eliquis, R breast cancer s/p chemo who presented in RVR and was found to have COVID and Flu. Patient is a poor historian due to dementia and is not clear on why she is in the hospital but says because of her dementia. On ROS she denies headache, lightheadedness, vision changes, CP,SOB, cough,constipation, diarrhea, dysuria, muscle or joint pain. She does endorse a little nausea and abdominal pain. When speaking with her daughter on the phone it appears the patient started having a dry cough and SOB on 12/9 with many sick contacts. She does not have a cough at baseline.She has retained great appetite per family.  ED Course: EMS called for coughing and dyspnea. Patient found to be in Afib en route to the ED. Has been in RVR but HDS and saturating 94-95% on RA. Started on dilt ggt. COVID + ,Flu +. CT chest consistent with bronchopneumonia. Meds:  Current Outpatient Medications  Medication Instructions   acetaminophen (TYLENOL) 500 mg, Oral, Every 6 hours PRN   albuterol (VENTOLIN HFA) 108 (90 Base) MCG/ACT inhaler 2 puffs, Inhalation, Every 4 hours PRN   apixaban (ELIQUIS) 5 MG TABS tablet TAKE 1 TABLET(5 MG) BY MOUTH TWICE DAILY   atorvastatin (LIPITOR) 80 MG tablet TAKE 1 TABLET(80 MG) BY MOUTH DAILY   buPROPion (WELLBUTRIN) 75 MG tablet Take 1 tablet by  mouth twice daily.   furosemide (LASIX) 40 mg, Oral, Daily   losartan (COZAAR) 25 mg, Oral, Daily   memantine (NAMENDA) 10 mg, Oral, 2 times daily   metoprolol succinate (TOPROL-XL) 50 mg, Oral, 2 times daily   Multiple Vitamins-Minerals (MULTIVITAMIN WITH MINERALS) tablet 1 tablet, Oral, Daily   Phenylephrine-Pheniramine-DM (THERAFLU COLD & COUGH PO) Oral   potassium chloride (KLOR-CON M) 10 MEQ tablet TAKE 1 TABLET(10 MEQ) BY MOUTH TWICE DAILY   sertraline (ZOLOFT) 50 MG tablet Take 1 tablet by mouth daily.   spironolactone (ALDACTONE) 12.5 mg, Oral, Daily       Allergies: Allergies as of 04/06/2022 - Review Complete 04/06/2022  Allergen Reaction Noted   Influenza virus vaccine Hives 02/03/2017   Myrbetriq [mirabegron er]  06/13/2021   Pneumococcal vaccine Hives 02/03/2017   Ativan [lorazepam]  10/17/2021   Past Medical History:  Diagnosis Date   Anxiety    Breast cancer ([place])    Chronic combined systolic and diastolic CHF (congestive heart failure) ([place])    a. Echo 11/2021: LVEF of 40-45% with global hypokinesis, mildly reduced RV systolic function, and moderate MR   COPD (chronic obstructive pulmonary disease) ([facility])    Dementia ([facility])    Depression    HTN (hypertension)    Paroxysmal atrial fibrillation ([place]) 2017  Stroke [facility])     Family History:  Family History  Problem Relation Age of Onset   Arrhythmia Father      Social History: Lives with her daughter and son in law who help with most IADLs/ADLs. Of note able to bath and toilet by herself. Does ambulate without a walker. Reports some falls. No current substance use.  Review of Systems: A complete ROS was negative except as per HPI.   Physical Exam: Blood pressure (!) 110/94, pulse (!) 139, temperature (!) 100.8 F (38.2 C), temperature source Oral, resp. rate (!) 29, height '5\' 4"'$  (1.626 m), weight 49 kg, SpO2 94 %. Gen: Well appearing, no acute distress, laying in bed and cooperative HEENT: No conjunctival  palor, mucous membranes moist CV: irregular rhythm, regular rate, normal s1/s2, 2+ bilateral radial and DP pulses Pulm, minimally increased wob with ambulation otherwise normal work at rest, LCTAB, rare stray dry cough Abd: minimal distension, soft, non-tender Extremities: warm, dry, no LE edema Neuro: Alert and oriented to person, city, year. Poor attention. CN 2-12 intact. Decreased gait stability with ambulation.  EKG: personally reviewed my interpretation is irregularly irregular rhythm, tachycardic, no acute ST/T wave changes  CXR: personally reviewed my interpretation is enlarged bilateral lung volumes, diaphragmatic narrowing, increased interstitial opacification of the RLL  Assessment & Plan by Problem: Active Problems:   * No active hospital problems. * Marisa Nunez is a 76 y/o female with a pmh of dementia, COPD, combined CHF, persistent afib on eliquis who presented in RVR and was found to have COVID and Flu.  COVID FluCOPD Patient presented to the ED per family for nocturnal cough, dyspnea and many sick contacts that started the morning of 12/9. Has had good oral intake during this time. On arrival febrile to 100.8, tachycardic to 140 2/2 afib with RVR now rate controlled at <110, tachypnea in the mid 20s, saturating 91-95% on RA which is at goal given COPD. On exam no increased work of breathing at rest with minimal dyspnea with walking, lungs CTAB. CXR with no acute airspace disease, CT with findings consistent with broncho pneumonia. WBC minimally elevated to 11.8. Not clinically consistent with bacterial pneumonia so will not treat with antibiotics. Given COPD and age will consider paxlovid, although deferring tonight as this would be an indication to stop her eliquis for persistent Afib. Given how clinically well she appears, I am not sure the small risk of stroke with holding eliquis is worth taking for the likely minimal benefit of COVID antiviral. Per pharmacy can start  molnupiravir  with similar efficacy and no need to stop anticoagulation. She did get tamiflu in the ED as well as cefepime and vanc. -Albuterol prn -Daily cbc -F/u Blood cx -PT/OT -O2 goal 88-92%  Persistent Afib on Eliquis Persistent Afib over the last few months per cardiology, just saw them 02/2022. Recently failed cardioversion. Not an ablation candidate due to dementia. Presented in RVR, now rate controlled on diltiazem ggt 5.7.'5mg'$ /hr overnight. HDS. No CP, SOB, lightheadedness or dizziness. -Eliquis '5mg'$  daily -Dilt ggt 5-'15mg'$ /hr  Chronic combined CHF with mildly reduced EF HTN Most recent echo 11/2021 with LVEF 40-45%, global LV hypokinesis, moderate MR, mild TR. BNP today 1,150 but clinically euvolemic and no evidence of pulmonary edema. Will continue home lasix and GDMT. -losartan '25mg'$  daily -Hold metoprolol 50 BID -furosemide 40 mg daily -Patient not taking spironolactone 12.'5mg'$  daily  HLD -atorvastatin '80mg'$   Dementia -Namenda '10mg'$  BID -delirium precautions  MDD -Wellbutrin '75mg'$  BID -Zoloft '50mg'$  daily  Dispo: Admit patient to Observation with expected length of stay less than 2 midnights.  CODE STATUS: DNR  Signed: Iona Coach, MD 04/06/2022, 7:51 PM  Pager: [phone] After 5pm on weekdays and 1pm on weekends: On Call pager: [phone]

## 2022-04-06 NOTE — ED Notes (Signed)
[phone] Son; Genevieve Norlander. Wants update

## 2022-04-06 NOTE — ED Provider Notes (Signed)
[facility] EMERGENCY DEPARTMENT Provider Note   CSN: [id number] Arrival date & time: 04/06/22  1515     History  Chief Complaint  Patient presents with   Shortness of Breath    Weakness    Marisa Nunez is a 76 y.o. female.  With PMH of CHF, COPD, HTN, HLD, paroxysmal A-fib on Eliquis, dementia, breast cancer on chemo BIB EMS from home with reports of fever, hallucinations, increased work of breathing and A-fib RVR.  She was given 500 cc IV fluids and 10 of IV Cardizem from EMS as she was found to be in A-fib RVR.  According to EMS, she was taken from home where she lives with family.  She has been around family who have had recent viral URIs.  Patient started developing symptoms and for her increased work of breathing and they were concerned and called EMS.  When I speak to the patient, she does note increased cough that has been productive of clear sputum.  She is denying any associated chest pain or shortness of breath and she notes being compliant with her medications and taking her medications today.  She denying any increased pain in her legs or swelling.  She has had no vomiting, diarrhea or melena or hematochezia.  She says she is compliant with her blood thinners.  HPI     Home Medications Prior to Admission medications   Medication Sig Start Date End Date Taking? Authorizing Provider  acetaminophen (TYLENOL) 500 MG tablet Take 1 tablet (500 mg total) by mouth every 6 (six) hours as needed for moderate pain, fever or mild pain. 10/24/21   Pokhrel, Corrie Mckusick, MD  albuterol (VENTOLIN HFA) 108 (90 Base) MCG/ACT inhaler Inhale 2 puffs into the lungs every 4 (four) hours as needed for wheezing or shortness of breath.    [provider]  apixaban (ELIQUIS) 5 MG TABS tablet TAKE 1 TABLET(5 MG) BY MOUTH TWICE DAILY 02/28/22   Sherran Needs, NP  atorvastatin (LIPITOR) 80 MG tablet TAKE 1 TABLET(80 MG) BY MOUTH DAILY 06/25/21   Timothy Lasso, MD  buPROPion  (WELLBUTRIN) 75 MG tablet Take 1 tablet by mouth twice daily. 10/15/21   Timothy Lasso, MD  furosemide (LASIX) 20 MG tablet Take 2 tablets (40 mg total) by mouth daily. 02/11/22   Charise Killian, MD  losartan (COZAAR) 50 MG tablet Take 0.5 tablets (25 mg total) by mouth daily. 11/28/21   Mapp, Claudia Desanctis, MD  memantine (NAMENDA) 10 MG tablet Take 10 mg by mouth 2 (two) times daily.    [provider]  metoprolol succinate (TOPROL-XL) 50 MG 24 hr tablet Take 1 tablet (50 mg total) by mouth 2 (two) times daily. 02/27/22   Sherran Needs, NP  Multiple Vitamins-Minerals (MULTIVITAMIN WITH MINERALS) tablet Take 1 tablet by mouth daily.    [provider]  potassium chloride (KLOR-CON M) 10 MEQ tablet TAKE 1 TABLET(10 MEQ) BY MOUTH TWICE DAILY 02/11/22   Charise Killian, MD  sertraline (ZOLOFT) 50 MG tablet Take 1 tablet by mouth daily. 10/17/21   Timothy Lasso, MD  spironolactone (ALDACTONE) 25 MG tablet Take 0.5 tablets (12.5 mg total) by mouth daily. 12/10/21 11/30/23  Sande Rives E, PA-C      Allergies    Influenza virus vaccine, Myrbetriq [mirabegron er], Pneumococcal vaccine, and Ativan [lorazepam]    Review of Systems   Review of Systems  Physical Exam Updated Vital Signs BP 134/77   Pulse (!) 139   Temp 99.9 F (  37.7 C) (Oral)   Resp (!) 26   Ht '5\' 4"'$  (1.626 m)   Wt 49 kg   LMP  (LMP Unknown)   SpO2 94%   BMI 18.54 kg/m  Physical Exam Constitutional: Alert and following commands, chronically ill-appearing slightly confused Eyes: Conjunctivae are normal. ENT      Head: Normocephalic and atraumatic.      Nose: No congestion.      Mouth/Throat: Mucous membranes are dry.      Neck: No stridor. Cardiovascular: S1, S2, irregularly irregular rhythm, tachycardic, warm and well-perfused, equal palpable radial pulses Respiratory: Tachypneic, BS CTA B, O2 sat ranging 92-95 on RA, no crackles, no rhonchi, no wheezing with dry cough Gastrointestinal: Soft and nontender.   Musculoskeletal: Normal range of motion in all extremities. No pitting edema, RLE slightly larger than LLE Neurologic: Normal speech and language.  Slightly confused.  Following all commands.  Moves extremities equally.  Sensation grossly intact.  No facial droop.  No gross focal neurologic deficits are appreciated. Skin: Skin is warm, dry and intact. No rash noted. Psychiatric: Mood and affect are normal. Speech and behavior are normal.  ED Results / Procedures / Treatments   Labs (all labs ordered are listed, but only abnormal results are displayed) Labs Reviewed  RESP PANEL BY RT-PCR (RSV, FLU A&B, COVID)  RVPGX2 - Abnormal; Notable for the following components:      Result Value   SARS Coronavirus 2 by RT PCR POSITIVE (*)    Influenza A by PCR POSITIVE (*)    All other components within normal limits  COMPREHENSIVE METABOLIC PANEL - Abnormal; Notable for the following components:   Sodium 131 (*)    Chloride 97 (*)    Glucose, Bld 117 (*)    All other components within normal limits  CBC WITH DIFFERENTIAL/PLATELET - Abnormal; Notable for the following components:   WBC 11.8 (*)    Hemoglobin 11.4 (*)    HCT 34.5 (*)    RDW 16.8 (*)    Neutro Abs 11.0 (*)    Lymphs Abs 0.2 (*)    All other components within normal limits  APTT - Abnormal; Notable for the following components:   aPTT 22 (*)    All other components within normal limits  URINALYSIS, ROUTINE W REFLEX MICROSCOPIC - Abnormal; Notable for the following components:   Color, Urine STRAW (*)    Hgb urine dipstick SMALL (*)    Ketones, ur 5 (*)    All other components within normal limits  BRAIN NATRIURETIC PEPTIDE - Abnormal; Notable for the following components:   B Natriuretic Peptide 1,150.2 (*)    All other components within normal limits  I-STAT VENOUS BLOOD GAS, ED - Abnormal; Notable for the following components:   pH, Ven 7.446 (*)    pCO2, Ven 34.8 (*)    Sodium 130 (*)    Calcium, Ion 1.09 (*)    All  other components within normal limits  CULTURE, BLOOD (ROUTINE X 2)  CULTURE, BLOOD (ROUTINE X 2)  URINE CULTURE  MRSA NEXT GEN BY PCR, NASAL  LACTIC ACID, PLASMA  PROTIME-INR  LACTIC ACID, PLASMA  MAGNESIUM  TROPONIN I (HIGH SENSITIVITY)  TROPONIN I (HIGH SENSITIVITY)    EKG EKG Interpretation  Date/Time:  Sunday April 06 2022 15:31:33 EST Ventricular Rate:  133 PR Interval:    QRS Duration: 89 QT Interval:  338 QTC Calculation: 503 R Axis:   51 Text Interpretation: Atrial fibrillation Minimal ST depression, inferior  leads Prolonged QT interval Confirmed by Georgina Snell [phone]) on 04/06/2022 3:47:15 PM  Radiology CT Angio Chest PE W and/or Wo Contrast  Result Date: 04/06/2022 CLINICAL DATA:  Short of breath, cough, nausea, atrial fibrillation EXAM: CT ANGIOGRAPHY CHEST WITH CONTRAST TECHNIQUE: Multidetector CT imaging of the chest was performed using the standard protocol during bolus administration of intravenous contrast. Multiplanar CT image reconstructions and MIPs were obtained to evaluate the vascular anatomy. RADIATION DOSE REDUCTION: This exam was performed according to the departmental dose-optimization program which includes automated exposure control, adjustment of the mA and/or kV according to patient size and/or use of iterative reconstruction technique. CONTRAST:  45m OMNIPAQUE IOHEXOL 350 MG/ML SOLN COMPARISON:  04/06/2022 FINDINGS: Cardiovascular: This is a technically adequate evaluation of the pulmonary vasculature. No filling defects or pulmonary emboli. Prominent biatrial dilatation. No pericardial effusion. Atherosclerosis throughout the coronary vasculature. Normal caliber of the thoracic aorta. Diffuse aortic atherosclerosis. No evidence of dissection. Mediastinum/Nodes: No enlarged mediastinal, hilar, or axillary lymph nodes. Thyroid gland, trachea, and esophagus demonstrate no significant findings. Lungs/Pleura: There is bilateral bronchial wall  thickening, greatest at the lung bases. There is patchy bibasilar airspace disease, most pronounced within the left lower lobe. No effusion or pneumothorax. Central airways are patent. Upper Abdomen: No acute abnormality. Musculoskeletal: No acute or destructive bony lesions. Right shoulder arthroplasty. Reconstructed images demonstrate no additional findings. Review of the MIP images confirms the above findings. IMPRESSION: 1. No evidence of pulmonary embolus. 2. Bibasilar bronchial wall thickening and patchy airspace disease, greatest in the left lower lobe, consistent with bronchopneumonia. 3. Biatrial dilatation. 4. Aortic Atherosclerosis (ICD10-I70.0). Coronary artery atherosclerosis. Electronically Signed   By: MRanda NgoM.D.   On: 04/06/2022 18:14   DG Chest Port 1 View  Result Date: 04/06/2022 CLINICAL DATA:  Sepsis EXAM: PORTABLE CHEST 1 VIEW COMPARISON:  01/31/2022 FINDINGS: Single frontal view of the chest demonstrates mild enlargement the cardiac silhouette, stable. No acute airspace disease, effusion, or pneumothorax. Right shoulder arthroplasty. IMPRESSION: 1. Stable enlarged cardiac silhouette.  No acute airspace disease. Electronically Signed   By: MRanda NgoM.D.   On: 04/06/2022 16:08    Procedures .Critical Care  Performed by: BElgie Congo MD Authorized by: BElgie Congo MD   Critical care provider statement:    Critical care time (minutes):  35   Critical care was necessary to treat or prevent imminent or life-threatening deterioration of the following conditions:  Sepsis, respiratory failure and cardiac failure   Critical care was time spent personally by me on the following activities:  Development of treatment plan with patient or surrogate, discussions with consultants, evaluation of patient's response to treatment, examination of patient, ordering and review of laboratory studies, ordering and review of radiographic studies, ordering and performing  treatments and interventions, pulse oximetry, re-evaluation of patient's condition, review of old charts and obtaining history from patient or surrogate   Care discussed with: admitting provider      Medications Ordered in ED Medications  diltiazem (CARDIZEM) 1 mg/mL load via infusion 15 mg (15 mg Intravenous Bolus from Bag 04/06/22 1809)    And  diltiazem (CARDIZEM) 125 mg in dextrose 5% 125 mL (1 mg/mL) infusion (5 mg/hr Intravenous New Bag/Given 04/06/22 1808)  ceFEPIme (MAXIPIME) 2 g in sodium chloride 0.9 % 100 mL IVPB (0 g Intravenous Stopped 04/06/22 1700)  sodium chloride 0.9 % bolus 500 mL (0 mLs Intravenous Stopped 04/06/22 1734)  acetaminophen (TYLENOL) tablet 1,000 mg (1,000 mg Oral Given 04/06/22 1622)  vancomycin (VANCOCIN) IVPB 1000 mg/200 mL premix (0 mg Intravenous Stopped 04/06/22 1901)  OLANZapine zydis (ZYPREXA) disintegrating tablet 5 mg (5 mg Oral Given 04/06/22 1801)  oseltamivir (TAMIFLU) capsule 75 mg (75 mg Oral Given 04/06/22 1801)  ipratropium-albuterol (DUONEB) 0.5-2.5 (3) MG/3ML nebulizer solution 3 mL (3 mLs Nebulization Given 04/06/22 1801)  iohexol (OMNIPAQUE) 350 MG/ML injection 75 mL (75 mLs Intravenous Contrast Given 04/06/22 1757)  furosemide (LASIX) injection 20 mg (20 mg Intravenous Given 04/06/22 1952)    ED Course/ Medical Decision Making/ A&P Clinical Course as of 04/06/22 1953  Sun Apr 06, 2022  1831 Reassessed patient who is heart rate is significantly improved now in the 90s currently on Cardizem drip at 5.  Her respiratory rate is in the mid 20s.  She is satting 90 to 94% on room air.  CTA reviewed which showed evidence of left lower sided bronchopneumonia.  She has received antibiotics.  BNP significantly elevated at 1150.  Holding off from any further fluids. Lasix 20 IV ordered.  Requires echocardiogram inpatient.  Paging hospitalist for admission. [VB]    Clinical Course User Index [VB] Elgie Congo, MD                            Medical Decision Making KESSA FAIRBAIRN is a 76 y.o. female.  With PMH of CHF, COPD, HTN, HLD, paroxysmal A-fib on Eliquis, dementia, breast cancer on chemo BIB EMS from home with reports of fever, increased work of breathing and A-fib RVR.  She was given 500 cc IV fluids and 10 of IV Cardizem from EMS as she was found to be in A-fib RVR.   Patient presented febrile 38.2 C with dry cough and in A-fib RVR with rates in the 140s stable blood pressure and tachypnea in the mid 30s but clear breath sounds satting 92-97 on RA.  She had no wheezing suggestive of COPD exacerbation.  She did not look fluid overloaded and had no pitting edema of her lower extremities.  Due to concern for possible sepsis from bacterial pneumonia she was started on broad-spectrum antibiotics and gentle fluids with history of HFrEF and having received 500 cc IV fluids from prior EMS team.  Chest x-ray obtained which I personally reviewed showed enlarged cardiac silhouette which was stable from prior but no consolidation concerning for pneumonia, no pleural effusion, no pulmonary edema.  Viral swabs resulted positive for COVID and influenza.  She was started on Cardizem bolus and infusion for A-fib RVR which did not improve with gentle fluids, antibiotics and Tylenol for fever.  Lactate 1.4 and reassuring unlikely septic.  Mildly hyponatremic 131.  VBG with respiratory alkalosis consistent with increased work of breathing.  She did have mild leukocytosis 11.8 with left shift.  CTA PE study obtained to rule out PE which showed left lower bronchopneumonia. No PE.  BNP elevated from baseline 1150, ordered for IV diuresis.  Discussed case with internal medicine team will be down to evaluate the patient and put in orders for admission for continued management of bronchopneumonia, A-fib RVR, influenza and COVID.    Amount and/or Complexity of Data Reviewed Labs: ordered. Radiology: ordered.  Risk OTC drugs. Prescription drug  management. Decision regarding hospitalization.    Final Clinical Impression(s) / ED Diagnoses Final diagnoses:  Atrial fibrillation with RVR (HCC)  SOB (shortness of breath)  Sepsis, due to unspecified organism, unspecified whether acute organ dysfunction present ([place])  COVID-19  Influenza A  Bronchopneumonia    Rx / DC Orders ED Discharge Orders     None         Elgie Congo, MD 04/06/22 [phone]

## 2022-04-07 DIAGNOSIS — J441 Chronic obstructive pulmonary disease with (acute) exacerbation: Secondary | ICD-10-CM | POA: Diagnosis present

## 2022-04-07 DIAGNOSIS — J1100 Influenza due to unidentified influenza virus with unspecified type of pneumonia: Secondary | ICD-10-CM | POA: Diagnosis present

## 2022-04-07 DIAGNOSIS — Z87891 Personal history of nicotine dependence: Secondary | ICD-10-CM

## 2022-04-07 DIAGNOSIS — U071 COVID-19: Secondary | ICD-10-CM | POA: Diagnosis present

## 2022-04-07 DIAGNOSIS — F0393 Unspecified dementia, unspecified severity, with mood disturbance: Secondary | ICD-10-CM | POA: Diagnosis present

## 2022-04-07 DIAGNOSIS — I5042 Chronic combined systolic (congestive) and diastolic (congestive) heart failure: Secondary | ICD-10-CM | POA: Diagnosis present

## 2022-04-07 DIAGNOSIS — E785 Hyperlipidemia, unspecified: Secondary | ICD-10-CM | POA: Diagnosis present

## 2022-04-07 DIAGNOSIS — J180 Bronchopneumonia, unspecified organism: Secondary | ICD-10-CM | POA: Diagnosis present

## 2022-04-07 DIAGNOSIS — J440 Chronic obstructive pulmonary disease with acute lower respiratory infection: Secondary | ICD-10-CM | POA: Diagnosis present

## 2022-04-07 DIAGNOSIS — Z9221 Personal history of antineoplastic chemotherapy: Secondary | ICD-10-CM | POA: Diagnosis not present

## 2022-04-07 DIAGNOSIS — Z79899 Other long term (current) drug therapy: Secondary | ICD-10-CM | POA: Diagnosis not present

## 2022-04-07 DIAGNOSIS — Z853 Personal history of malignant neoplasm of breast: Secondary | ICD-10-CM | POA: Diagnosis not present

## 2022-04-07 DIAGNOSIS — I4891 Unspecified atrial fibrillation: Secondary | ICD-10-CM | POA: Diagnosis not present

## 2022-04-07 DIAGNOSIS — I110 Hypertensive heart disease with heart failure: Secondary | ICD-10-CM | POA: Diagnosis present

## 2022-04-07 DIAGNOSIS — J101 Influenza due to other identified influenza virus with other respiratory manifestations: Secondary | ICD-10-CM

## 2022-04-07 DIAGNOSIS — I4819 Other persistent atrial fibrillation: Secondary | ICD-10-CM | POA: Diagnosis present

## 2022-04-07 DIAGNOSIS — J1008 Influenza due to other identified influenza virus with other specified pneumonia: Secondary | ICD-10-CM | POA: Diagnosis present

## 2022-04-07 DIAGNOSIS — Z888 Allergy status to other drugs, medicaments and biological substances status: Secondary | ICD-10-CM | POA: Diagnosis not present

## 2022-04-07 DIAGNOSIS — Z7901 Long term (current) use of anticoagulants: Secondary | ICD-10-CM | POA: Diagnosis not present

## 2022-04-07 DIAGNOSIS — Z66 Do not resuscitate: Secondary | ICD-10-CM | POA: Diagnosis present

## 2022-04-07 LAB — URINE CULTURE

## 2022-04-07 LAB — CBC
HCT: 32.2 % — ABNORMAL LOW (ref 36.0–46.0)
Hemoglobin: 10.5 g/dL — ABNORMAL LOW (ref 12.0–15.0)
MCH: 26.6 pg (ref 26.0–34.0)
MCHC: 32.6 g/dL (ref 30.0–36.0)
MCV: 81.7 fL (ref 80.0–100.0)
Platelets: 249 10*3/uL (ref 150–400)
RBC: 3.94 MIL/uL (ref 3.87–5.11)
RDW: 16.7 % — ABNORMAL HIGH (ref 11.5–15.5)
WBC: 6.7 10*3/uL (ref 4.0–10.5)
nRBC: 0 % (ref 0.0–0.2)

## 2022-04-07 LAB — BASIC METABOLIC PANEL
Anion gap: 9 (ref 5–15)
BUN: 10 mg/dL (ref 8–23)
CO2: 26 mmol/L (ref 22–32)
Calcium: 8.3 mg/dL — ABNORMAL LOW (ref 8.9–10.3)
Chloride: 95 mmol/L — ABNORMAL LOW (ref 98–111)
Creatinine, Ser: 0.65 mg/dL (ref 0.44–1.00)
GFR, Estimated: >60 mL/min (ref >60)
Glucose, Bld: 93 mg/dL (ref 70–99)
Potassium: 3.2 mmol/L — ABNORMAL LOW (ref 3.5–5.1)
Sodium: 130 mmol/L — ABNORMAL LOW (ref 135–145)

## 2022-04-07 LAB — PROCALCITONIN: Procalcitonin: 0.31 ng/mL

## 2022-04-07 LAB — MAGNESIUM: Magnesium: 1.7 mg/dL (ref 1.7–2.4)

## 2022-04-07 MED ORDER — POTASSIUM CHLORIDE CRYS ER 20 MEQ PO TBCR
40.0000 meq | EXTENDED_RELEASE_TABLET | ORAL | Status: AC
  Administered 2022-04-07 (×2): 40 meq via ORAL
  Filled 2022-04-07 (×2): qty 2

## 2022-04-07 MED ORDER — OSELTAMIVIR PHOSPHATE 30 MG PO CAPS
30.0000 mg | ORAL_CAPSULE | Freq: Two times a day (BID) | ORAL | Status: DC
  Administered 2022-04-07 – 2022-04-08 (×3): 30 mg via ORAL
  Filled 2022-04-07 (×4): qty 1

## 2022-04-07 MED ORDER — METOPROLOL TARTRATE 25 MG PO TABS
25.0000 mg | ORAL_TABLET | Freq: Four times a day (QID) | ORAL | Status: DC
  Administered 2022-04-07 – 2022-04-08 (×3): 25 mg via ORAL
  Filled 2022-04-07 (×3): qty 1

## 2022-04-07 MED ORDER — MAGNESIUM SULFATE 4 GM/100ML IV SOLN
4.0000 g | Freq: Once | INTRAVENOUS | Status: AC
  Administered 2022-04-07: 4 g via INTRAVENOUS
  Filled 2022-04-07: qty 100

## 2022-04-07 MED ORDER — MOLNUPIRAVIR EUA 200MG CAPSULE
4.0000 | ORAL_CAPSULE | Freq: Two times a day (BID) | ORAL | Status: DC
  Administered 2022-04-07 – 2022-04-08 (×3): 800 mg via ORAL
  Filled 2022-04-07 (×2): qty 4

## 2022-04-07 MED ORDER — POTASSIUM CHLORIDE CRYS ER 10 MEQ PO TBCR
10.0000 meq | EXTENDED_RELEASE_TABLET | Freq: Two times a day (BID) | ORAL | Status: DC
  Administered 2022-04-08: 10 meq via ORAL
  Filled 2022-04-07: qty 1

## 2022-04-07 NOTE — ED Notes (Signed)
Found pt out of bed. Pt had pulled out her IV. Pt was bleeding everywhere. Pt had also pulled off all of her leads and monitoring.

## 2022-04-07 NOTE — ED Notes (Signed)
Provider made aware of pts current status and VS. Pt sleeping, no signs of distress noted.

## 2022-04-07 NOTE — ED Notes (Signed)
Daughter [name] [phone] would like an update asap and do NOT call from the room

## 2022-04-07 NOTE — ED Notes (Signed)
Pro

## 2022-04-07 NOTE — Progress Notes (Addendum)
HD#0 Subjective:   Summary: This is a 76 year old female with a past medical history of dementia, COPD, congestive CHF, persistent A-fib on Eliquis who presented with concerns of a cough.  Patient found to be COVID-positive and influenza A positive as well as in A-fib with RVR.  Patient admitted for further workup and evaluation.  Overnight Events: No overnight events  Patient evaluated bedside today. Patient reports that she is doing well.  Patient does not complain of any cough.  She denies any shortness of breath.  She denies any lightheadedness or dizziness.  She denies any palpitations.  Patient states she is doing well.  Objective:  Vital signs in last 24 hours: Vitals:   04/07/22 0857 04/07/22 0900 04/07/22 1100 04/07/22 1200  BP: (!) 171/84 (!) 169/82  126/74  Pulse: (!) 112 72 88 85  Resp:  (!) 29 20 (!) 22  Temp:      TempSrc:      SpO2:  95% 98% 93%  Weight:      Height:       Supplemental O2: Nasal Cannula SpO2: 93 % O2 Flow Rate (L/min): 2 L/min   Physical Exam:  Constitutional: Resting in bed in no acute distress HENT: normocephalic atraumatic Neck: No JVD  Cardiovascular: Irregularly irregular rhythm Pulmonary/Chest: Some congestion noted to bilateral lung fields, no wheezing noted Abdominal: No abdominal tenderness Neurological: alert & oriented x 2 Skin: warm and dry Extremities: Patient has trace edema to bilateral lower extremities  Filed Weights   04/06/22 1545  Weight: 49 kg     Intake/Output Summary (Last 24 hours) at 04/07/2022 1358 Last data filed at 04/06/2022 1901 Gross per 24 hour  Intake 200 ml  Output --  Net 200 ml   Net IO Since Admission: 200 mL [04/07/22 1358]  Pertinent Labs:    Latest Ref Rng & Units 04/07/2022    6:57 AM 04/06/2022    4:08 PM 04/06/2022    4:00 PM  CBC  WBC 4.0 - 10.5 K/uL 6.7   11.8   Hemoglobin 12.0 - 15.0 g/dL 10.5  12.6  11.4   Hematocrit 36.0 - 46.0 % 32.2  37.0  34.5   Platelets 150 - 400  K/uL 249   274        Latest Ref Rng & Units 04/07/2022    6:57 AM 04/06/2022    4:08 PM 04/06/2022    4:00 PM  CMP  Glucose 70 - 99 mg/dL 93   117   BUN 8 - 23 mg/dL 10   10   Creatinine 0.44 - 1.00 mg/dL 0.65   0.73   Sodium 135 - 145 mmol/L 130  130  131   Potassium 3.5 - 5.1 mmol/L 3.2  3.5  3.5   Chloride 98 - 111 mmol/L 95   97   CO2 22 - 32 mmol/L 26   22   Calcium 8.9 - 10.3 mg/dL 8.3   8.9   Total Protein 6.5 - 8.1 g/dL   6.7   Total Bilirubin 0.3 - 1.2 mg/dL   0.7   Alkaline Phos 38 - 126 U/L   113   AST 15 - 41 U/L   30   ALT 0 - 44 U/L   22     Imaging: CT Angio Chest PE W and/or Wo Contrast  Result Date: 04/06/2022 CLINICAL DATA:  Short of breath, cough, nausea, atrial fibrillation EXAM: CT ANGIOGRAPHY CHEST WITH CONTRAST TECHNIQUE: Multidetector CT imaging of the  chest was performed using the standard protocol during bolus administration of intravenous contrast. Multiplanar CT image reconstructions and MIPs were obtained to evaluate the vascular anatomy. RADIATION DOSE REDUCTION: This exam was performed according to the departmental dose-optimization program which includes automated exposure control, adjustment of the mA and/or kV according to patient size and/or use of iterative reconstruction technique. CONTRAST:  86m OMNIPAQUE IOHEXOL 350 MG/ML SOLN COMPARISON:  04/06/2022 FINDINGS: Cardiovascular: This is a technically adequate evaluation of the pulmonary vasculature. No filling defects or pulmonary emboli. Prominent biatrial dilatation. No pericardial effusion. Atherosclerosis throughout the coronary vasculature. Normal caliber of the thoracic aorta. Diffuse aortic atherosclerosis. No evidence of dissection. Mediastinum/Nodes: No enlarged mediastinal, hilar, or axillary lymph nodes. Thyroid gland, trachea, and esophagus demonstrate no significant findings. Lungs/Pleura: There is bilateral bronchial wall thickening, greatest at the lung bases. There is patchy bibasilar  airspace disease, most pronounced within the left lower lobe. No effusion or pneumothorax. Central airways are patent. Upper Abdomen: No acute abnormality. Musculoskeletal: No acute or destructive bony lesions. Right shoulder arthroplasty. Reconstructed images demonstrate no additional findings. Review of the MIP images confirms the above findings. IMPRESSION: 1. No evidence of pulmonary embolus. 2. Bibasilar bronchial wall thickening and patchy airspace disease, greatest in the left lower lobe, consistent with bronchopneumonia. 3. Biatrial dilatation. 4. Aortic Atherosclerosis (ICD10-I70.0). Coronary artery atherosclerosis. Electronically Signed   By: MRanda NgoM.D.   On: 04/06/2022 18:14   DG Chest Port 1 View  Result Date: 04/06/2022 CLINICAL DATA:  Sepsis EXAM: PORTABLE CHEST 1 VIEW COMPARISON:  01/31/2022 FINDINGS: Single frontal view of the chest demonstrates mild enlargement the cardiac silhouette, stable. No acute airspace disease, effusion, or pneumothorax. Right shoulder arthroplasty. IMPRESSION: 1. Stable enlarged cardiac silhouette.  No acute airspace disease. Electronically Signed   By: MRanda NgoM.D.   On: 04/06/2022 16:08    Assessment/Plan:   Principal Problem:   COVID-19   Patient Summary: Marisa SLAGHTis a 76y.o. female with a past medical history of dementia, COPD, congestive CHF, persistent A-fib on Eliquis who presented with concerns of a cough.  Patient found to be COVID-positive and influenza A positive as well as in A-fib with RVR.  Patient admitted for further workup and evaluation.  #Bronchopneumonia #COVID-positive #Influenza A positive Patient initially presented with a dry cough.  Patient has multiple sick contacts in her entire family.  Patient did recently spike fever with most recent at 7 AM this morning at 100.5 F.  Patient is actively coughing in the room.  On exam, there is noted congestion heard in the lungs.  Patient is not becoming hypoxic.   Patient does have a past medical history of COPD, so patient is at risk for worsening disease.  Currently patient is stable.  Patient denies any shortness of breath. Patient was given antibiotics in the ED but lower suspicion for bacterial eitology.  Given patient has multiple comorbidities, will start molnupiravir and continue with Tamiflu. -Day 2 of 5 for Tamiflu -Day 1 of Molnupiravir  -Pro cal pending -Continue to monitor Respiratory status  -Continue to monitor fever curve  #Persistent A-fib Patient was initially in RVR, and was started on dilt drip.  Patient is not in RVR anymore.  Recent rates have been in the 80s.  Patient denies any lightheadedness, palpitations, or dizziness.  Blood pressures have been stable. -Plan to transition from dilt drip to metoprolol to tartrate 25 mg every 6 hours -Continue Eliquis 5 mg twice daily  #Combined systolic and  diastolic heart failure Patient has past medical history of systolic and diastolic heart failure.  Patient's most recent echo 11/27/2021 which showed ejection fraction of 40 to 45%.  Patient also has some diastolic dysfunction.  Patient currently on Lasix as well as metoprolol succinate at home.  Patient blood pressure currently in the 120s.  Patient did have blood pressures into the 160s.  On exam, patient did have trace edema. -Continue Lasix 40 mg daily -Continue losartan 25 mg daily -Start metoprolol 25 mg every 6 hours -Patient states that he does not patient does not take spironolactone, will hold off for now -Replete potassium to keep potassium greater than 4.  #COPD -Continue to monitor respiratory status -Continue to supplement oxygen to keep oxygen levels between 88 to 92%. -Albuterol as needed  #Dementia No concerns at this moment.  Patient does seem to be alert and oriented x 2.  Patient does have episodes of disorientation but is redirectable. -Sitter -Continue memantine 10 mg twice daily  #Hypomagnesemia Patient's  magnesium decreased today. -Replete  #Hyperlipidemia -Continue atorvastatin 80 mg daily  #MDD -Continue Wellbutrin 75 mg twice daily -Continue Zoloft 50 mg daily  Diet: Normal IVF: None,None VTE: DOAC Code: DNR PT/OT recs: Pending  Dispo: Anticipated discharge to Home in 1 days pending Clinical Improvement.   [place] [facility] Resident PGY-1 [phone] Please contact the on call pager after 5 pm and on weekends at [phone].

## 2022-04-07 NOTE — ED Notes (Signed)
Pt assisted to [facility]. Pt still altered, ripping off cords and agitated. PO meds administered. Pt denies pain.

## 2022-04-08 ENCOUNTER — Other Ambulatory Visit (HOSPITAL_COMMUNITY): Payer: Self-pay

## 2022-04-08 DIAGNOSIS — U071 COVID-19: Secondary | ICD-10-CM | POA: Diagnosis not present

## 2022-04-08 LAB — MAGNESIUM: Magnesium: 2.2 mg/dL (ref 1.7–2.4)

## 2022-04-08 LAB — CBC
HCT: 34.5 % — ABNORMAL LOW (ref 36.0–46.0)
Hemoglobin: 11.3 g/dL — ABNORMAL LOW (ref 12.0–15.0)
MCH: 26.8 pg (ref 26.0–34.0)
MCHC: 32.8 g/dL (ref 30.0–36.0)
MCV: 81.9 fL (ref 80.0–100.0)
Platelets: 247 10*3/uL (ref 150–400)
RBC: 4.21 MIL/uL (ref 3.87–5.11)
RDW: 16.8 % — ABNORMAL HIGH (ref 11.5–15.5)
WBC: 6.6 10*3/uL (ref 4.0–10.5)
nRBC: 0 % (ref 0.0–0.2)

## 2022-04-08 LAB — BASIC METABOLIC PANEL
Anion gap: 13 (ref 5–15)
BUN: 15 mg/dL (ref 8–23)
CO2: 25 mmol/L (ref 22–32)
Calcium: 8.6 mg/dL — ABNORMAL LOW (ref 8.9–10.3)
Chloride: 94 mmol/L — ABNORMAL LOW (ref 98–111)
Creatinine, Ser: 0.71 mg/dL (ref 0.44–1.00)
GFR, Estimated: >60 mL/min (ref >60)
Glucose, Bld: 94 mg/dL (ref 70–99)
Potassium: 3.7 mmol/L (ref 3.5–5.1)
Sodium: 132 mmol/L — ABNORMAL LOW (ref 135–145)

## 2022-04-08 MED ORDER — POTASSIUM CHLORIDE CRYS ER 20 MEQ PO TBCR
40.0000 meq | EXTENDED_RELEASE_TABLET | Freq: Once | ORAL | Status: AC
  Administered 2022-04-08: 40 meq via ORAL
  Filled 2022-04-08: qty 2

## 2022-04-08 MED ORDER — MOLNUPIRAVIR EUA 200MG CAPSULE: 4.0000 | ORAL_CAPSULE | Freq: Two times a day (BID) | ORAL | 0 refills | Status: AC

## 2022-04-08 MED ORDER — OSELTAMIVIR PHOSPHATE 30 MG PO CAPS
30.0000 mg | ORAL_CAPSULE | Freq: Two times a day (BID) | ORAL | 0 refills | Status: DC
  Filled 2022-04-08: qty 4, 2d supply, fill #0

## 2022-04-08 MED ORDER — METOPROLOL SUCCINATE ER 50 MG PO TB24
50.0000 mg | ORAL_TABLET | Freq: Two times a day (BID) | ORAL | Status: DC
  Administered 2022-04-08: 50 mg via ORAL
  Filled 2022-04-08: qty 1

## 2022-04-08 MED ORDER — OSELTAMIVIR PHOSPHATE 30 MG PO CAPS: 30.0000 mg | ORAL_CAPSULE | Freq: Two times a day (BID) | ORAL | 0 refills | Status: AC

## 2022-04-08 MED ORDER — MOLNUPIRAVIR EUA 200MG CAPSULE
4.0000 | ORAL_CAPSULE | Freq: Two times a day (BID) | ORAL | 0 refills | Status: DC
  Filled 2022-04-08: qty 24, 3d supply, fill #0

## 2022-04-08 MED ORDER — AZITHROMYCIN 250 MG PO TABS
250.0000 mg | ORAL_TABLET | Freq: Every day | ORAL | 0 refills | Status: AC
  Filled 2022-04-08: qty 3, 3d supply, fill #0

## 2022-04-08 MED ORDER — PREDNISONE 20 MG PO TABS
40.0000 mg | ORAL_TABLET | Freq: Every day | ORAL | 0 refills | Status: AC
  Filled 2022-04-08: qty 10, 5d supply, fill #0

## 2022-04-08 MED ORDER — TRELEGY ELLIPTA 100-62.5-25 MCG/ACT IN AEPB
1.0000 | INHALATION_SPRAY | Freq: Every day | RESPIRATORY_TRACT | 3 refills | Status: DC
  Filled 2022-04-08: qty 60, 30d supply, fill #0

## 2022-04-08 MED ORDER — INCRUSE ELLIPTA 62.5 MCG/ACT IN AEPB
1.0000 | INHALATION_SPRAY | Freq: Every day | RESPIRATORY_TRACT | 0 refills | Status: DC
  Filled 2022-04-08: qty 30, 30d supply, fill #0

## 2022-04-08 NOTE — ED Notes (Signed)
Pt is resting peacefully in bed

## 2022-04-08 NOTE — TOC Benefit Eligibility Note (Signed)
Patient Teacher, English as a foreign language completed.    The patient is currently admitted and upon discharge could be taking Trelegy Ellipta 100-625.25 mcg.  The current 30 day co-pay is $47.00.   The patient is insured through [place] Patient Advocate Specialist [place] Patient Advocate Team Direct Number: [phone]  Fax: [phone]

## 2022-04-08 NOTE — Progress Notes (Addendum)
HD#1 Subjective:   Summary: This is a 76 year old female with a past medical history of dementia, COPD, congestive CHF, persistent A-fib on Eliquis who presented with concerns of a cough.  Patient found to be COVID-positive and influenza A positive as well as in A-fib with RVR.  Patient admitted for further workup and evaluation.  Overnight Events: No overnight events  Patient evaluated bedside today.  She states that she is doing well.  She does report a little cough.  She denies any sputum production.  Patient denies any lightheadedness or dizziness.  She denies any palpitations.  Objective:  Vital signs in last 24 hours: Vitals:   04/08/22 0200 04/08/22 0300 04/08/22 0302 04/08/22 0625  BP: (!) 160/112 (!) 153/97  (!) 145/100  Pulse: 99 (!) 118  87  Resp: (!) '23 19  18  '$ Temp:   99.4 F (37.4 C) 99.5 F (37.5 C)  TempSrc:   Oral   SpO2: 91% 90%  93%  Weight:      Height:       Supplemental O2: Room air  SpO2: 91%  Physical Exam:  Constitutional: Resting in bed in no acute distress HENT: normocephalic atraumatic Cardiovascular: Irregularly irregular rhythm Pulmonary/Chest: Some congestion noted to bilateral lung fields, no wheezing noted Abdominal: No abdominal tenderness Neurological: alert & oriented x 2 Skin: warm and dry Extremities: Trace edema to bilateral lower extremities  Filed Weights   04/06/22 1545  Weight: 49 kg    No intake or output data in the 24 hours ending 04/08/22 0740  Net IO Since Admission: 200 mL [04/08/22 0740]  Pertinent Labs:    Latest Ref Rng & Units 04/08/2022    4:15 AM 04/07/2022    6:57 AM 04/06/2022    4:08 PM  CBC  WBC 4.0 - 10.5 K/uL 6.6  6.7    Hemoglobin 12.0 - 15.0 g/dL 11.3  10.5  12.6   Hematocrit 36.0 - 46.0 % 34.5  32.2  37.0   Platelets 150 - 400 K/uL 247  249         Latest Ref Rng & Units 04/08/2022    4:15 AM 04/07/2022    6:57 AM 04/06/2022    4:08 PM  CMP  Glucose 70 - 99 mg/dL 94  93    BUN 8 -  23 mg/dL 15  10    Creatinine 0.44 - 1.00 mg/dL 0.71  0.65    Sodium 135 - 145 mmol/L 132  130  130   Potassium 3.5 - 5.1 mmol/L 3.7  3.2  3.5   Chloride 98 - 111 mmol/L 94  95    CO2 22 - 32 mmol/L 25  26    Calcium 8.9 - 10.3 mg/dL 8.6  8.3      Imaging: No results found.  Assessment/Plan:   Principal Problem:   COVID-19 Active Problems:   Atrial fibrillation with RVR (HCC)   Influenza with pneumonia   Influenza A   Patient Summary: Marisa Nunez is a 76 y.o. female with a past medical history of dementia, COPD, congestive CHF, persistent A-fib on Eliquis who presented with concerns of a cough.  Patient found to be COVID-positive and influenza A positive as well as in A-fib with RVR.  Patient admitted for further workup and evaluation.  #Bronchopneumonia #COVID-positive #Influenza A positive Patient still has a cough.  Patient denies any sputum, but she does report that she has some clear sputum at times.  Patient reports had multiple  sick contacts.  Patient has not become hypoxic.  She remains afebrile with most recent fever yesterday at 12:45 PM.  On exam, there is still some congestion heard, but patient is not wheezing.  Patient does not look tachypneic.  Patient is comfortable not requiring oxygen.  Continue with Tamiflu and molnupiravir.  -Day 3 of 5 for Tamiflu -Day 2 of 5 of Molnupiravir  -Continue to monitor Respiratory status  -Continue to monitor fever curve  #Suspected COPD exacerbation likely secondary to above There is some charting noted on patient having previous COPD exacerbations.  Patient does not have formal PFTs.  Patient has been treated before the.  Given increased cough, increased sputum production, this could be a COPD exacerbation. -Albuterol as needed -Start Incruse -Start azithromycin 250 mg daily for the next 3 days -Start prednisone 40 mg daily -Continue to supplement oxygen to keep oxygen levels between 88 to 92%. -Recommend outpatient PFTs  +/- pulmonary rehab +/- triple inhaler therapy  #Persistent A-fib Patient is no longer in RVR.  Patient's rates have been well-controlled recently.  Plan to transition patient to metoprolol succinate 50 mg twice daily which is her home regimen.  Continue Eliquis. -Transition patient to metoprolol succinate 50 mg twice daily -Continue Eliquis 5 mg twice daily  #Combined systolic and diastolic heart failure Patient has past medical history of systolic and diastolic heart failure.  Patient's most recent echo 11/27/2021 which showed ejection fraction of 40 to 45%.  Patient also has some diastolic dysfunction.  Patient doing well from a respiratory standpoint.  Spoke to daughter about spironolactone, and patient's daughter states that the patient has not been taking it, because they were unsure if to take it or not.  I instructed patient daughter that it is important and patient's daughter was agreeable to the spironolactone. -Continue Lasix 40 mg daily -Continue losartan 25 mg daily -Transition to metoprolol succinate 50 mg twice daily -Resume home spironolactone 12.5 mg daily -Mag goal greater than 2, potassium goal greater than 4  #Dementia No concerns at this moment.  Patient does seem to be alert and oriented x 2.  Patient does have episodes of disorientation but is redirectable. -Sitter -Continue memantine 10 mg twice daily  #Hypomagnesemia, resolved  Patient's magnesium was replaced yesterday. -Repeat mag pending   #Hyperlipidemia -Continue atorvastatin 80 mg daily  #MDD -Continue Wellbutrin 75 mg twice daily -Continue Zoloft 50 mg daily  Diet: Normal IVF: None,None VTE: DOAC Code: DNR PT/OT recs: No follow-up needed  Dispo: Anticipated discharge to Home in 1 days pending Clinical Improvement.   [facility] Resident PGY-1 [phone] Please contact the on call pager after 5 pm and on weekends at [phone].

## 2022-04-08 NOTE — Evaluation (Signed)
Physical Therapy Evaluation Patient Details Name: Marisa Nunez MRN: [id number] DOB: [birth year] Today's Date: 04/08/2022  History of Present Illness  76 yo female presents to ED on 12/10 with fever, hallucinations, increased work of breathing, afib with RVR. + covid and influenza. PMH of CHF, COPD, HTN, HLD, paroxysmal A-fib on Eliquis, dementia, breast cancer on chemo.  Clinical Impression   Pt presents with impaired activity tolerance and min impaired balance. Pt to benefit from acute PT to address deficits. Pt ambulated short hallway distance, limited by stool incontinence. SpO2 90% and greater on RA during gait when waveform is accurate, pulse oximeter was very inconsistently reading pt. Pt with no dyspnea on exertion. PT to progress mobility as tolerated, and will continue to follow acutely.         Recommendations for follow up therapy are one component of a multi-disciplinary discharge planning process, led by the attending physician.  Recommendations may be updated based on patient status, additional functional criteria and insurance authorization.  Follow Up Recommendations No PT follow up      Assistance Recommended at Discharge PRN  Patient can return home with the following       Equipment Recommendations None recommended by PT  Recommendations for Other Services       Functional Status Assessment Patient has not had a recent decline in their functional status     Precautions / Restrictions Precautions Precautions: Fall      Mobility  Bed Mobility Overal bed mobility: Modified Independent             General bed mobility comments: sitting EOB upon PT arrival to room    Transfers Overall transfer level: Modified independent Equipment used: None               General transfer comment: increased time, no physical assist    Ambulation/Gait Ambulation/Gait assistance: Supervision Gait Distance (Feet): 75 Feet Assistive device: None Gait  Pattern/deviations: Step-through pattern, Decreased stride length Gait velocity: decr     General Gait Details: for safety, pt incontinent of stool during gait requiring PT to point it out to her and get her to bathroom. SPO2 90% and greater when pleth is accurate, but sensor has a difficult time reading pt. PT changed sensor, did not help  Stairs            Wheelchair Mobility    Modified Rankin (Stroke Patients Only)       Balance Overall balance assessment: Mild deficits observed, not formally tested                                           Pertinent Vitals/Pain Pain Assessment Pain Assessment: No/denies pain    Home Living Family/patient expects to be discharged to:: Private residence Living Arrangements: Other (Comment);Children (daughter, granddaughters) Available Help at Discharge: Family;Available PRN/intermittently Type of Home: House Home Access: Stairs to enter   [employer] of Steps: 4   Home Layout: One level Home Equipment: Shower seat;Hand held shower head      Prior Function Prior Level of Function : Needs assist;Patient poor historian/Family not available                     Hand Dominance   Dominant Hand: Right    Extremity/Trunk Assessment   Upper Extremity Assessment Upper Extremity Assessment: Defer to OT evaluation    Lower  Extremity Assessment Lower Extremity Assessment: Overall WFL for tasks assessed    Cervical / Trunk Assessment Cervical / Trunk Assessment: Normal  Communication   Communication: No difficulties  Cognition Arousal/Alertness: Awake/alert Behavior During Therapy: WFL for tasks assessed/performed Overall Cognitive Status: History of cognitive impairments - at baseline                                 General Comments: states "my mind is going, so if I answer something wrong tell me"        General Comments      Exercises     Assessment/Plan    PT  Assessment Patient needs continued PT services  PT Problem List Decreased strength;Decreased mobility;Decreased activity tolerance;Decreased balance;Decreased knowledge of use of DME;Pain;Decreased safety awareness       PT Treatment Interventions DME instruction;Therapeutic activities;Gait training;Therapeutic exercise;Patient/family education;Balance training;Stair training;Functional mobility training    PT Goals (Current goals can be found in the Care Plan section)  Acute Rehab PT Goals PT Goal Formulation: With patient Time For Goal Achievement: 04/22/22 Potential to Achieve Goals: Good    Frequency Min 3X/week     Co-evaluation               AM-PAC PT "6 Clicks" Mobility  Outcome Measure Help needed turning from your back to your side while in a flat bed without using bedrails?: None Help needed moving from lying on your back to sitting on the side of a flat bed without using bedrails?: None Help needed moving to and from a bed to a chair (including a wheelchair)?: None Help needed standing up from a chair using your arms (e.g., wheelchair or bedside chair)?: None Help needed to walk in hospital room?: A Little Help needed climbing 3-5 steps with a railing? : A Little 6 Click Score: 22    End of Session   Activity Tolerance: Patient tolerated treatment well Patient left: in bed;with call bell/phone within reach;with bed alarm set (sitting EOB with bed alarm set) Nurse Communication: Mobility status PT Visit Diagnosis: Other abnormalities of gait and mobility (R26.89)    Time: 1130-1150 PT Time Calculation (min) (ACUTE ONLY): 20 min   Charges:   PT Evaluation $PT Eval Low Complexity: 1 Low         Derriana Oser S, PT DPT Acute Rehabilitation Services Pager [phone]  Office [phone]   Roxine Caddy E Ruffin Pyo 04/08/2022, 2:08 PM

## 2022-04-08 NOTE — Discharge Instructions (Signed)
Mr. Marisa Nunez,  It was a pleasure taking care of you at [place] were admitted for atrial fibrillation as well as COVID and flu.  We are discharging you home now that you are doing better. Please follow the following instructions.   1) Regarding your COVID and flu treatments. Please take your medications for this.  For flu treatment, please take Tamiflu for the next 2 days outpatient.  For COVID treatment please take molnupiravir for the next 3 days to total a 5-day course.  2) For your COPD, please start taking your new inhaler daily.  Please take azithromycin for the next 3 days.  Please take steroids (prednisone) for the next 5 days as well.  Please speak with your primary care physician for lung doctor follow-up.  3) Regarding your heart failure medications, please continue taking metoprolol, Lasix, losartan, and spironolactone.  Ensure you have refills from your primary care physician.  4) I have scheduled an appointment for you to have an appointment with your primary care physician.  Please go to your appointment at the internal medicine center with Dr. Cain Sieve on 04/16/2022 at 915am.  5) regarding your atrial fibrillation, you have gone back to regular rates.  Please continue taking metoprolol twice daily and Eliquis 5 mg twice daily.  Take care,  Dr. Leigh Aurora, DO

## 2022-04-08 NOTE — Discharge Summary (Cosign Needed)
Name: Marisa Nunez MRN: [id number] DOB: 07-21-1945 76 y.o. PCP: Marisa Mussel, MD  Date of Admission: 04/06/2022  3:15 PM Date of Discharge: 04/08/2022 Attending Physician: Dr. Charise Killian   Discharge Diagnosis: Principal Problem:   COVID-19 Active Problems:   Atrial fibrillation with RVR ([place])   Influenza with pneumonia   Influenza A    Discharge Medications: Allergies as of 04/08/2022       Reactions   Influenza Virus Vaccine Hives   Myrbetriq [mirabegron Er]    Confusion, agitation, hallucination    Pneumococcal Vaccine Hives   Ativan [lorazepam]    Makes pt agitated per daughter         Medication List     STOP taking these medications    buPROPion 75 MG tablet Commonly known as: WELLBUTRIN       TAKE these medications    acetaminophen 500 MG tablet Commonly known as: TYLENOL Take 1 tablet (500 mg total) by mouth every 6 (six) hours as needed for moderate pain, fever or mild pain.   albuterol 108 (90 Base) MCG/ACT inhaler Commonly known as: VENTOLIN HFA Inhale 2 puffs into the lungs every 4 (four) hours as needed for wheezing or shortness of breath.   apixaban 5 MG Tabs tablet Commonly known as: Eliquis TAKE 1 TABLET(5 MG) BY MOUTH TWICE DAILY What changed:  how much to take how to take this when to take this additional instructions   atorvastatin 80 MG tablet Commonly known as: LIPITOR TAKE 1 TABLET(80 MG) BY MOUTH DAILY What changed:  how much to take how to take this when to take this additional instructions   azithromycin 250 MG tablet Commonly known as: Zithromax Take 1 tablet (250 mg total) by mouth daily for 3 days.   furosemide 20 MG tablet Commonly known as: LASIX Take 2 tablets (40 mg total) by mouth daily.   Incruse Ellipta 62.5 MCG/ACT Aepb Generic drug: umeclidinium bromide Inhale 1 puff into the lungs daily.   losartan 50 MG tablet Commonly known as: COZAAR Take 0.5 tablets (25 mg total) by mouth daily.    memantine 10 MG tablet Commonly known as: NAMENDA Take 10 mg by mouth 2 (two) times daily.   metoprolol succinate 50 MG 24 hr tablet Commonly known as: TOPROL-XL Take 1 tablet (50 mg total) by mouth 2 (two) times daily.   molnupiravir EUA 200 mg Caps capsule Commonly known as: LAGEVRIO Take 4 capsules (800 mg total) by mouth 2 (two) times daily for 3 days.   multivitamin with minerals tablet Take 1 tablet by mouth daily.   oseltamivir 30 MG capsule Commonly known as: TAMIFLU Take 1 capsule (30 mg total) by mouth 2 (two) times daily for 2 days.   potassium chloride 10 MEQ tablet Commonly known as: KLOR-CON M TAKE 1 TABLET(10 MEQ) BY MOUTH TWICE DAILY What changed: See the new instructions.   predniSONE 20 MG tablet Commonly known as: DELTASONE Take 2 tablets (40 mg total) by mouth daily for 5 days.   sertraline 50 MG tablet Commonly known as: ZOLOFT Take 1 tablet by mouth daily. What changed:  how much to take how to take this when to take this   spironolactone 25 MG tablet Commonly known as: ALDACTONE Take 0.5 tablets (12.5 mg total) by mouth daily.   THERAFLU COLD & COUGH PO Take by mouth.        Disposition and follow-up:   Marisa Nunez was discharged from [facility] in Good  condition.  At the hospital follow up visit please address:  1.  Follow-up:  a.  COVID-positive/flu positive/COPD exacerbation: Patient was admitted for COVID, flu, and seem to be in COPD exacerbation.  Patient does not have formal PFTs, so we are thinking she has suspected COPD.  Patient has been treated for COPD in the past.  Ensure patient is compliant with her new inhaler.  Ensure patient has completed treatment of Tamiflu as well as molnupiravir.  Ensure patient's respiratory status back to baseline.    b.  Atrial fibrillation with RVR: Ensure patient remains rate controlled with metoprolol succinate 50 mg daily and Eliquis 5 mg twice daily.  Ensure patient  remains rate controlled.   c.  Combined systolic and diastolic heart failure: Patient discharged on GDMT with metoprolol succinate 50 mg twice daily, losartan 25 mg daily, and spironolactone 12.5 mg daily.  Patient also on Lasix 40 mg daily.  Ensure patient has adherence and remain euvolemic.  Titrate medications as needed.  2.  Labs / imaging needed at time of follow-up: Patient should get PFTs outpatient  3.  Pending labs/ test needing follow-up: N/A  4.  Medication Changes  1) Patient discharged on Incruse   2) Patient discharged on Tamiflu and molnupiravir, ensure patient finishes course  3) Patient discharged on 5-day steroid course with prednisone 40 mg for the next 5 days  4) Patient discharged on azithromycin 250 mg daily for next 3 days, ensure patient finishes course  Follow-up Appointments:  Follow-up Information     Marisa Mussel, MD. Go to.   Specialty: Internal Medicine Why: Please go to your appointment on 04/16/2022 at 9:15 AM at the internal medicine center Contact information: [address] [place] [address] [place] [facility] Course by problem list: Marisa Nunez is a 76 y.o. female with a past medical history of dementia, COPD, congestive CHF, persistent A-fib on Eliquis who presented with concerns of a cough.  Patient found to be COVID-positive and influenza A positive as well as in A-fib with RVR.  Patient admitted for further workup and evaluation.   #Bronchopneumonia #COVID-positive #Influenza A positive Patient presented with a cough.  Patient was positive for COVID and flu.  CT did not show a PE, but did show bronchopneumonia.  Patient was started on Tamiflu as well as molnupiravir.  Patient did not become hypoxic.  Patient did not get worse.  She improved throughout hospitalization.  Patient returned back to baseline.  Patient was discharged on Tamiflu and molnupiravir.  #Suspected COPD exacerbation likely  secondary to above Patient did have worsening cough and sputum production.  Patient does have suspected COPD with no formal PFTs.  We did treat this like a COPD exacerbation started patient on Incruse, albuterol, azithromycin, and prednisone on discharge.    #Persistent A-fib Patient was also admitted for atrial fibrillation with RVR.  Patient was started on diltiazem drip, and then taken off and transition to her home metoprolol dose metoprolol succinate 50 mg twice daily.  Patient remained in rate controlled during hospitalization. Patient is on Eliquis and metoprolol succinate.   #Combined systolic and diastolic heart failure Patient has past medical history of systolic and diastolic heart failure.  Patient's most recent echo 11/27/2021 which showed ejection fraction of 40 to 45%.  Patient also has some diastolic dysfunction.  Patient doing well from a respiratory standpoint.  Spoke to daughter  about spironolactone, and patient's daughter states that the patient has not been taking it, because they were unsure if to take it or not.  I instructed patient daughter that it is important and patient's daughter was agreeable to the spironolactone.  Patient discharged on Lasix 40 mg daily, losartan 25 mg daily, metoprolol succinate 50 mg twice daily, and spironolactone 12.5 mg daily.  Patient's mag goal was greater than 2 and potassium greater than 4.  Patient had supplementation as needed to keep electrolytes at goal.  #Dementia No concerns during hospitalization, patient did get agitated a couple times, but was easily redirectable.  Patient was continued on memantine 10 mg twice daily   #Hyperlipidemia Patient was continued on atorvastatin 80 mg daily   #MDD Patient was continued on home Wellbutrin 75 mg twice daily and Zoloft 50 mg daily   Discharge Subjective: Patient evaluated bedside today. She states that she is doing well. She does report a little cough. She denies any sputum production.  Patient denies any lightheadedness or dizziness. She denies any palpitations.   Discharge Exam:   BP (!) 148/94 (BP Location: Left Arm)   Pulse 83   Temp 98.6 F (37 C) (Oral)   Resp 20   Ht '5\' 4"'$  (1.626 m)   Wt 49 kg   LMP  (LMP Unknown)   SpO2 97%   BMI 18.54 kg/m  Constitutional: Resting in bed in no acute distress HENT: normocephalic atraumatic Cardiovascular: Irregularly irregular rhythm Pulmonary/Chest: Some congestion noted to bilateral lung fields, no wheezing noted Abdominal: No abdominal tenderness Neurological: alert & oriented x 2 Skin: warm and dry Extremities: Trace edema to bilateral lower extremities  Pertinent Labs, Studies, and Procedures:     Latest Ref Rng & Units 04/08/2022    4:15 AM 04/07/2022    6:57 AM 04/06/2022    4:08 PM  CBC  WBC 4.0 - 10.5 K/uL 6.6  6.7    Hemoglobin 12.0 - 15.0 g/dL 11.3  10.5  12.6   Hematocrit 36.0 - 46.0 % 34.5  32.2  37.0   Platelets 150 - 400 K/uL 247  249         Latest Ref Rng & Units 04/08/2022    4:15 AM 04/07/2022    6:57 AM 04/06/2022    4:08 PM  CMP  Glucose 70 - 99 mg/dL 94  93    BUN 8 - 23 mg/dL 15  10    Creatinine 0.44 - 1.00 mg/dL 0.71  0.65    Sodium 135 - 145 mmol/L 132  130  130   Potassium 3.5 - 5.1 mmol/L 3.7  3.2  3.5   Chloride 98 - 111 mmol/L 94  95    CO2 22 - 32 mmol/L 25  26    Calcium 8.9 - 10.3 mg/dL 8.6  8.3      CT Angio Chest PE W and/or Wo Contrast  Result Date: 04/06/2022 CLINICAL DATA:  Short of breath, cough, nausea, atrial fibrillation EXAM: CT ANGIOGRAPHY CHEST WITH CONTRAST TECHNIQUE: Multidetector CT imaging of the chest was performed using the standard protocol during bolus administration of intravenous contrast. Multiplanar CT image reconstructions and MIPs were obtained to evaluate the vascular anatomy. RADIATION DOSE REDUCTION: This exam was performed according to the departmental dose-optimization program which includes automated exposure control, adjustment of the mA  and/or kV according to patient size and/or use of iterative reconstruction technique. CONTRAST:  87m OMNIPAQUE IOHEXOL 350 MG/ML SOLN COMPARISON:  04/06/2022 FINDINGS: Cardiovascular: This is  a technically adequate evaluation of the pulmonary vasculature. No filling defects or pulmonary emboli. Prominent biatrial dilatation. No pericardial effusion. Atherosclerosis throughout the coronary vasculature. Normal caliber of the thoracic aorta. Diffuse aortic atherosclerosis. No evidence of dissection. Mediastinum/Nodes: No enlarged mediastinal, hilar, or axillary lymph nodes. Thyroid gland, trachea, and esophagus demonstrate no significant findings. Lungs/Pleura: There is bilateral bronchial wall thickening, greatest at the lung bases. There is patchy bibasilar airspace disease, most pronounced within the left lower lobe. No effusion or pneumothorax. Central airways are patent. Upper Abdomen: No acute abnormality. Musculoskeletal: No acute or destructive bony lesions. Right shoulder arthroplasty. Reconstructed images demonstrate no additional findings. Review of the MIP images confirms the above findings. IMPRESSION: 1. No evidence of pulmonary embolus. 2. Bibasilar bronchial wall thickening and patchy airspace disease, greatest in the left lower lobe, consistent with bronchopneumonia. 3. Biatrial dilatation. 4. Aortic Atherosclerosis (ICD10-I70.0). Coronary artery atherosclerosis. Electronically Signed   By: Randa Ngo M.D.   On: 04/06/2022 18:14   DG Chest Port 1 View  Result Date: 04/06/2022 CLINICAL DATA:  Sepsis EXAM: PORTABLE CHEST 1 VIEW COMPARISON:  01/31/2022 FINDINGS: Single frontal view of the chest demonstrates mild enlargement the cardiac silhouette, stable. No acute airspace disease, effusion, or pneumothorax. Right shoulder arthroplasty. IMPRESSION: 1. Stable enlarged cardiac silhouette.  No acute airspace disease. Electronically Signed   By: Randa Ngo M.D.   On: 04/06/2022 16:08      Discharge Instructions: Discharge Instructions     Call MD for:  difficulty breathing, headache or visual disturbances   Complete by: As directed    Call MD for:  extreme fatigue   Complete by: As directed    Call MD for:  hives   Complete by: As directed    Call MD for:  persistant dizziness or light-headedness   Complete by: As directed    Call MD for:  persistant nausea and vomiting   Complete by: As directed    Call MD for:  redness, tenderness, or signs of infection (pain, swelling, redness, odor or green/yellow discharge around incision site)   Complete by: As directed    Call MD for:  severe uncontrolled pain   Complete by: As directed    Call MD for:  temperature >100.4   Complete by: As directed    Diet - low sodium heart healthy   Complete by: As directed    Increase activity slowly   Complete by: As directed       Mr. Aveena Bari,  It was a pleasure taking care of you at [facility] were admitted for atrial fibrillation as well as COVID and flu.  We are discharging you home now that you are doing better. Please follow the following instructions.   1) Regarding your COVID and flu treatments. Please take your medications for this.  For flu treatment, please take Tamiflu for the next 2 days outpatient.  For COVID treatment please take molnupiravir for the next 3 days to total a 5-day course.  2) For your COPD, please start taking your new inhaler daily.  Please take azithromycin for the next 3 days.  Please take steroids (prednisone) for the next 5 days as well.  Please speak with your primary care physician for lung doctor follow-up.  3) Regarding your heart failure medications, please continue taking metoprolol, Lasix, losartan, and spironolactone.  Ensure you have refills from your primary care physician.  4) I have scheduled an appointment for you to have an appointment with your  primary care physician.  Please go to your appointment at the internal  medicine center with Dr. Cain Sieve on 04/16/2022 at 915am.  5) regarding your atrial fibrillation, you have gone back to regular rates.  Please continue taking metoprolol twice daily and Eliquis 5 mg twice daily.  Take care,  Dr. Leigh Aurora, DO   Signed: Leigh Aurora, DO 04/08/2022, 5:40 PM   Pager: [phone]

## 2022-04-09 ENCOUNTER — Other Ambulatory Visit (HOSPITAL_COMMUNITY): Payer: Self-pay

## 2022-04-09 ENCOUNTER — Telehealth: Payer: Self-pay

## 2022-04-09 NOTE — Telephone Encounter (Signed)
Transition Care Management Follow-up Telephone Call Date of discharge and from where: [facility] 04/08/2022 How have you been since you were released from the hospital? better Any questions or concerns? No  Items Reviewed: Did the pt receive and understand the discharge instructions provided? Yes  Medications obtained and verified? Yes  Other? No  Any new allergies since your discharge? No  Dietary orders reviewed? Yes Do you have support at home? Yes   Home Care and Equipment/Supplies: Were home health services ordered? no If so, what is the name of the agency? N/a  Has the agency set up a time to come to the patient's home? not applicable Were any new equipment or medical supplies ordered?  No What is the name of the medical supply agency? N/a Were ;you able to get the supplies/equipment? not applicable Do you have any questions related to the use of the equipment or supplies? No  Functional Questionnaire: (I = Independent and D = Dependent) ADLs: I  Bathing/Dressing- I  Meal Prep- D  Eating- I  Maintaining continence- I  Transferring/Ambulation- I  Managing Meds- D  Follow up appointments reviewed:  PCP Hospital f/u appt confirmed? Yes  Scheduled to see Dr Gareth Morgan on 04/16/2022 @ 9:15. [facility] f/u appt confirmed? No   Are transportation arrangements needed? No  If their condition worsens, is the pt aware to call PCP or go to the Emergency Dept.? Yes Was the patient provided with contact information for the PCP's office or ED? Yes Was to pt encouraged to call back with questions or concerns? Yes  Juanda Crumble, LPN [place] Direct Dial [phone]

## 2022-04-11 LAB — CULTURE, BLOOD (ROUTINE X 2)
Culture: NO GROWTH
Culture: NO GROWTH
Special Requests: ADEQUATE
Special Requests: ADEQUATE

## 2022-04-14 ENCOUNTER — Telehealth: Payer: Self-pay

## 2022-04-14 NOTE — Telephone Encounter (Signed)
-----   Message from Jacqulynn Cadet, [place] sent at 12/05/2021  4:09 PM EDT ----- Regarding: Cardiac PET Good afternoon   Sande Rives, PA-C ordered a Cardiac PET test for this patient.  Thanks,  Ellison Carwin

## 2022-04-15 NOTE — Progress Notes (Deleted)
[facility]: Clinic Note  Subjective:  History of Present Illness: Marisa Nunez is a 76 y.o. year old female who presents for hospital follow up after recent admission for COPD exacerbation in the setting of influenza & COVID.  # COPD '[ ]'$  PFTs - hospital added LAMA, and consider triple therapy & pulm rehab - albuterol prn  - incruse ellipta  # Afib - Apixaban  - metop XL '50mg'$  BID  # HFrEF - BB, ARB, spiro, lasix  # dementia - memantine  # depression - Stopped buproprion???  HCM # Healthcare maintenance in women  - Colorectal cancer: (45-75, colo q10 yrs or FIT q1 year) - Breast cancer: (MMGq1-2 years for 50-75, +/- 40-50) - Cervical cancer: (21-29= pap w/ cytology q3 years; 30-65= pap + HPV q5 years) - Lung cancer: (50-80yo w/ 20+ pack year and smoked in last 15 years, annual low dose helical CT) - DEXA at 54+ or <65 if prior fx, steroids, low body weight, current cigs, excess ETOH, RA - HLD screen: - DM screen:  - Tobacco screen: - HCV screen: - HIV screen:   Vaccines - Flu: - Tdap (boost q10) - Varicella (all w/o immunity) - Zoster (50+) - Pneumococcal: (if 65+ or ETOH, heart/lung/liver dz, DM, SCD, cigs, cancer, CKD - give PCV20  - Hep B (all <60 or >60 at high risk) - COVID-19 (all)    Please refer to Assessment and Plan below for full details in Problem-Based Charting.   Past Medical History:  Patient Active Problem List   Diagnosis Date Noted   Influenza with pneumonia 04/07/2022   Influenza A 04/07/2022   Persistent atrial fibrillation (HCC)    Atrial fibrillation with RVR (HCC)    Hypomagnesemia    COPD (chronic obstructive pulmonary disease) ([place]) 10/19/2021   Dementia ([place]) 10/16/2021   Overactive bladder 05/24/2021   Vaginal lesion 05/24/2021   COVID-19 10/30/2020   History of TIA 10/30/2020   History of cerebral infarction 10/29/2020   History of right breast cancer 02/25/2018   History of tobacco use 05/06/2017    Mixed hyperlipidemia 05/06/2017   Osteopenia of multiple sites 05/06/2017   Essential hypertension 10/25/2015   PAF (paroxysmal atrial fibrillation) ([place]) 10/25/2015    Social History: Reviewed in [place]. Pertinent updates today include: ***  Family History: Reviewed in Epic. Pertinent updates today include: None  Medications:  Current Outpatient Medications:    acetaminophen (TYLENOL) 500 MG tablet, Take 1 tablet (500 mg total) by mouth every 6 (six) hours as needed for moderate pain, fever or mild pain., Disp: 30 tablet, Rfl: 0   albuterol (VENTOLIN HFA) 108 (90 Base) MCG/ACT inhaler, Inhale 2 puffs into the lungs every 4 (four) hours as needed for wheezing or shortness of breath., Disp: , Rfl:    apixaban (ELIQUIS) 5 MG TABS tablet, TAKE 1 TABLET(5 MG) BY MOUTH TWICE DAILY (Patient taking differently: Take 5 mg by mouth 2 (two) times daily.), Disp: 42 tablet, Rfl: 0   atorvastatin (LIPITOR) 80 MG tablet, TAKE 1 TABLET(80 MG) BY MOUTH DAILY (Patient taking differently: Take 80 mg by mouth daily.), Disp: 90 tablet, Rfl: 3   furosemide (LASIX) 20 MG tablet, Take 2 tablets (40 mg total) by mouth daily., Disp: 90 tablet, Rfl: 3   losartan (COZAAR) 50 MG tablet, Take 0.5 tablets (25 mg total) by mouth daily., Disp: 90 tablet, Rfl: 3   memantine (NAMENDA) 10 MG tablet, Take 10 mg by mouth 2 (two) times daily., Disp: ,  Rfl:    metoprolol succinate (TOPROL-XL) 50 MG 24 hr tablet, Take 1 tablet (50 mg total) by mouth 2 (two) times daily., Disp: 60 tablet, Rfl: 3   Multiple Vitamins-Minerals (MULTIVITAMIN WITH MINERALS) tablet, Take 1 tablet by mouth daily., Disp: , Rfl:    Phenylephrine-Pheniramine-DM (THERAFLU COLD & COUGH PO), Take by mouth., Disp: , Rfl:    potassium chloride (KLOR-CON M) 10 MEQ tablet, TAKE 1 TABLET(10 MEQ) BY MOUTH TWICE DAILY (Patient taking differently: Take 10 mEq by mouth 2 (two) times daily.), Disp: 90 tablet, Rfl: 2   sertraline (ZOLOFT) 50 MG tablet, Take 1 tablet by  mouth daily. (Patient taking differently: Take 50 mg by mouth daily. Take 1 tablet by mouth daily.), Disp: 90 tablet, Rfl: 0   spironolactone (ALDACTONE) 25 MG tablet, Take 0.5 tablets (12.5 mg total) by mouth daily., Disp: 90 tablet, Rfl: 3   umeclidinium bromide (INCRUSE ELLIPTA) 62.5 MCG/ACT AEPB, Inhale 1 puff into the lungs daily., Disp: 30 each, Rfl: 0   Allergies: Allergies  Allergen Reactions   Influenza Virus Vaccine Hives   Myrbetriq [Mirabegron Er]     Confusion, agitation, hallucination    Pneumococcal Vaccine Hives   Ativan [Lorazepam]     Makes pt agitated per daughter     Review of Systems: ROS   Objective:   Vitals: There were no vitals filed for this visit.  Physical Exam: Physical Exam   Data: Labs, imaging, and micro were reviewed in Epic. Refer to Assessment and Plan below for full details in Problem-Based Charting.  Assessment & Plan:  No problem-specific Assessment & Plan notes found for this encounter.     Patient will follow up in ***  [name], MD

## 2022-04-16 ENCOUNTER — Encounter: Payer: Medicare Other | Admitting: Internal Medicine

## 2022-05-08 ENCOUNTER — Telehealth: Payer: Self-pay | Admitting: Internal Medicine

## 2022-05-13 NOTE — Telephone Encounter (Signed)
Patient sent my chart message to contact our office to schedule an appointment.

## 2022-06-05 ENCOUNTER — Encounter (HOSPITAL_COMMUNITY): Payer: Self-pay | Admitting: *Deleted

## 2022-06-13 ENCOUNTER — Other Ambulatory Visit: Payer: Self-pay | Admitting: Student

## 2022-06-13 NOTE — Telephone Encounter (Signed)
LOV was 11/20/21; No Show x 2 per chart. Called pt to schedule an appt - talked to her daughter, [name]. The daughter stated they did not know about 04/16/22 appt. Call transferred to front office - appt schedule w/Dr [facility] 3/20 @ 1015 AM.

## 2022-06-14 ENCOUNTER — Emergency Department (HOSPITAL_COMMUNITY): Payer: Medicare Other

## 2022-06-14 ENCOUNTER — Emergency Department (HOSPITAL_COMMUNITY)
Admission: EM | Admit: 2022-06-14 | Discharge: 2022-06-15 | Disposition: A | Discharge status: Home / Self Care | Payer: Medicare Other | Attending: Emergency Medicine | Admitting: Emergency Medicine

## 2022-06-14 ENCOUNTER — Other Ambulatory Visit: Payer: Self-pay

## 2022-06-14 ENCOUNTER — Encounter (HOSPITAL_COMMUNITY): Payer: Self-pay

## 2022-06-14 DIAGNOSIS — R0602 Shortness of breath: Secondary | ICD-10-CM | POA: Diagnosis not present

## 2022-06-14 DIAGNOSIS — R1110 Vomiting, unspecified: Secondary | ICD-10-CM

## 2022-06-14 DIAGNOSIS — R519 Headache, unspecified: Secondary | ICD-10-CM | POA: Diagnosis not present

## 2022-06-14 DIAGNOSIS — Z20822 Contact with and (suspected) exposure to covid-19: Secondary | ICD-10-CM | POA: Insufficient documentation

## 2022-06-14 DIAGNOSIS — R112 Nausea with vomiting, unspecified: Secondary | ICD-10-CM | POA: Diagnosis present

## 2022-06-14 DIAGNOSIS — Z7901 Long term (current) use of anticoagulants: Secondary | ICD-10-CM | POA: Diagnosis not present

## 2022-06-14 DIAGNOSIS — F03B18 Unspecified dementia, moderate, with other behavioral disturbance: Secondary | ICD-10-CM | POA: Diagnosis not present

## 2022-06-14 DIAGNOSIS — R197 Diarrhea, unspecified: Secondary | ICD-10-CM | POA: Insufficient documentation

## 2022-06-14 DIAGNOSIS — Z79899 Other long term (current) drug therapy: Secondary | ICD-10-CM | POA: Insufficient documentation

## 2022-06-14 LAB — COMPREHENSIVE METABOLIC PANEL
ALT: 16 U/L (ref 0–44)
AST: 22 U/L (ref 15–41)
Albumin: 4.1 g/dL (ref 3.5–5.0)
Alkaline Phosphatase: 105 U/L (ref 38–126)
Anion gap: 6 (ref 5–15)
BUN: 13 mg/dL (ref 8–23)
CO2: 28 mmol/L (ref 22–32)
Calcium: 9.1 mg/dL (ref 8.9–10.3)
Chloride: 100 mmol/L (ref 98–111)
Creatinine, Ser: 0.63 mg/dL (ref 0.44–1.00)
GFR, Estimated: >60 mL/min (ref >60)
Glucose, Bld: 95 mg/dL (ref 70–99)
Potassium: 3 mmol/L — ABNORMAL LOW (ref 3.5–5.1)
Sodium: 134 mmol/L — ABNORMAL LOW (ref 135–145)
Total Bilirubin: 0.7 mg/dL (ref 0.3–1.2)
Total Protein: 7.4 g/dL (ref 6.5–8.1)

## 2022-06-14 LAB — CBC WITH DIFFERENTIAL/PLATELET
Abs Immature Granulocytes: 0.04 10*3/uL (ref 0.00–0.07)
Basophils Absolute: 0.1 10*3/uL (ref 0.0–0.1)
Basophils Relative: 1 %
Eosinophils Absolute: 0.1 10*3/uL (ref 0.0–0.5)
Eosinophils Relative: 1 %
HCT: 36.3 % (ref 36.0–46.0)
Hemoglobin: 11.4 g/dL — ABNORMAL LOW (ref 12.0–15.0)
Immature Granulocytes: 0 %
Lymphocytes Relative: 10 %
Lymphs Abs: 1 10*3/uL (ref 0.7–4.0)
MCH: 26.8 pg (ref 26.0–34.0)
MCHC: 31.4 g/dL (ref 30.0–36.0)
MCV: 85.4 fL (ref 80.0–100.0)
Monocytes Absolute: 0.8 10*3/uL (ref 0.1–1.0)
Monocytes Relative: 7 %
Neutro Abs: 8.7 10*3/uL — ABNORMAL HIGH (ref 1.7–7.7)
Neutrophils Relative %: 81 %
Platelets: 350 10*3/uL (ref 150–400)
RBC: 4.25 MIL/uL (ref 3.87–5.11)
RDW: 15 % (ref 11.5–15.5)
WBC: 10.7 10*3/uL — ABNORMAL HIGH (ref 4.0–10.5)
nRBC: 0 % (ref 0.0–0.2)

## 2022-06-14 LAB — RESP PANEL BY RT-PCR (RSV, FLU A&B, COVID)  RVPGX2
Influenza A by PCR: NEGATIVE
Influenza B by PCR: NEGATIVE
Resp Syncytial Virus by PCR: NEGATIVE
SARS Coronavirus 2 by RT PCR: NEGATIVE

## 2022-06-14 LAB — I-STAT CHEM 8, ED
BUN: 10 mg/dL (ref 8–23)
Calcium, Ion: 1.18 mmol/L (ref 1.15–1.40)
Chloride: 99 mmol/L (ref 98–111)
Creatinine, Ser: 0.6 mg/dL (ref 0.44–1.00)
Glucose, Bld: 88 mg/dL (ref 70–99)
HCT: 34 % — ABNORMAL LOW (ref 36.0–46.0)
Hemoglobin: 11.6 g/dL — ABNORMAL LOW (ref 12.0–15.0)
Potassium: 3.3 mmol/L — ABNORMAL LOW (ref 3.5–5.1)
Sodium: 137 mmol/L (ref 135–145)
TCO2: 28 mmol/L (ref 22–32)

## 2022-06-14 LAB — LIPASE, BLOOD: Lipase: 43 U/L (ref 11–51)

## 2022-06-14 LAB — PHOSPHORUS: Phosphorus: 3.6 mg/dL (ref 2.5–4.6)

## 2022-06-14 LAB — URINALYSIS, ROUTINE W REFLEX MICROSCOPIC
Bilirubin Urine: NEGATIVE
Glucose, UA: NEGATIVE mg/dL
Hgb urine dipstick: NEGATIVE
Ketones, ur: NEGATIVE mg/dL
Leukocytes,Ua: NEGATIVE
Nitrite: NEGATIVE
Protein, ur: NEGATIVE mg/dL
Specific Gravity, Urine: 1.01 (ref 1.005–1.030)
pH: 7 (ref 5.0–8.0)

## 2022-06-14 LAB — LACTIC ACID, PLASMA
Lactic Acid, Venous: 0.7 mmol/L (ref 0.5–1.9)
Lactic Acid, Venous: 1.6 mmol/L (ref 0.5–1.9)

## 2022-06-14 LAB — BLOOD GAS, VENOUS
Acid-Base Excess: 7.3 mmol/L — ABNORMAL HIGH (ref 0.0–2.0)
Bicarbonate: 33.6 mmol/L — ABNORMAL HIGH (ref 20.0–28.0)
O2 Saturation: 19.4 %
Patient temperature: 37
pCO2, Ven: 53 mmHg (ref 44–60)
pH, Ven: 7.41 (ref 7.25–7.43)
pO2, Ven: <31 mmHg — CL (ref 32–45)

## 2022-06-14 LAB — BRAIN NATRIURETIC PEPTIDE: B Natriuretic Peptide: 716.5 pg/mL — ABNORMAL HIGH (ref 0.0–100.0)

## 2022-06-14 LAB — RAPID URINE DRUG SCREEN, HOSP PERFORMED
Amphetamines: NOT DETECTED
Barbiturates: NOT DETECTED
Benzodiazepines: NOT DETECTED
Cocaine: NOT DETECTED
Opiates: NOT DETECTED
Tetrahydrocannabinol: NOT DETECTED

## 2022-06-14 LAB — MAGNESIUM: Magnesium: 2.3 mg/dL (ref 1.7–2.4)

## 2022-06-14 LAB — TROPONIN I (HIGH SENSITIVITY)
Troponin I (High Sensitivity): 7 ng/L (ref <18)
Troponin I (High Sensitivity): 7 ng/L (ref <18)

## 2022-06-14 LAB — ETHANOL: Alcohol, Ethyl (B): <10 mg/dL (ref <10)

## 2022-06-14 LAB — TSH: TSH: 1.277 u[IU]/mL (ref 0.350–4.500)

## 2022-06-14 MED ORDER — SPIRONOLACTONE 12.5 MG HALF TABLET
12.5000 mg | ORAL_TABLET | Freq: Every day | ORAL | Status: DC
  Administered 2022-06-15: 12.5 mg via ORAL
  Filled 2022-06-14: qty 1

## 2022-06-14 MED ORDER — POTASSIUM CHLORIDE 20 MEQ PO PACK
20.0000 meq | PACK | Freq: Once | ORAL | Status: AC
  Administered 2022-06-14: 20 meq via ORAL
  Filled 2022-06-14: qty 1

## 2022-06-14 MED ORDER — LOSARTAN POTASSIUM 25 MG PO TABS
25.0000 mg | ORAL_TABLET | Freq: Every day | ORAL | Status: DC
  Administered 2022-06-15: 25 mg via ORAL
  Filled 2022-06-14: qty 1

## 2022-06-14 MED ORDER — SERTRALINE HCL 50 MG PO TABS
25.0000 mg | ORAL_TABLET | Freq: Every day | ORAL | Status: DC
  Administered 2022-06-15: 25 mg via ORAL
  Filled 2022-06-14: qty 1

## 2022-06-14 MED ORDER — FUROSEMIDE 40 MG PO TABS
40.0000 mg | ORAL_TABLET | Freq: Every day | ORAL | Status: DC
  Administered 2022-06-15: 40 mg via ORAL
  Filled 2022-06-14: qty 1

## 2022-06-14 MED ORDER — SODIUM CHLORIDE 0.9 % IV SOLN: Freq: Once | INTRAVENOUS | Status: AC

## 2022-06-14 MED ORDER — HALOPERIDOL LACTATE 5 MG/ML IJ SOLN
2.5000 mg | Freq: Once | INTRAMUSCULAR | Status: AC
  Administered 2022-06-14: 2.5 mg via INTRAVENOUS
  Filled 2022-06-14: qty 1

## 2022-06-14 MED ORDER — POTASSIUM CHLORIDE CRYS ER 10 MEQ PO TBCR
10.0000 meq | EXTENDED_RELEASE_TABLET | Freq: Two times a day (BID) | ORAL | Status: DC
  Administered 2022-06-15 (×2): 10 meq via ORAL
  Filled 2022-06-14 (×2): qty 1

## 2022-06-14 MED ORDER — MEMANTINE HCL 5 MG PO TABS
10.0000 mg | ORAL_TABLET | Freq: Two times a day (BID) | ORAL | Status: DC
  Administered 2022-06-15 (×2): 10 mg via ORAL
  Filled 2022-06-14 (×2): qty 2

## 2022-06-14 MED ORDER — HALOPERIDOL LACTATE 5 MG/ML IJ SOLN
5.0000 mg | Freq: Once | INTRAMUSCULAR | Status: AC
  Administered 2022-06-14: 5 mg via INTRAVENOUS
  Filled 2022-06-14: qty 1

## 2022-06-14 MED ORDER — METOPROLOL SUCCINATE ER 50 MG PO TB24: 50.0000 mg | ORAL_TABLET | Freq: Two times a day (BID) | ORAL | Status: DC

## 2022-06-14 MED ORDER — METOPROLOL SUCCINATE ER 50 MG PO TB24
50.0000 mg | ORAL_TABLET | Freq: Two times a day (BID) | ORAL | Status: DC
  Administered 2022-06-15: 50 mg via ORAL
  Filled 2022-06-14: qty 1

## 2022-06-14 MED ORDER — IOHEXOL 300 MG/ML  SOLN
100.0000 mL | Freq: Once | INTRAMUSCULAR | Status: AC | PRN
  Administered 2022-06-14: 100 mL via INTRAVENOUS

## 2022-06-14 MED ORDER — DILTIAZEM LOAD VIA INFUSION
15.0000 mg | Freq: Once | INTRAVENOUS | Status: AC
  Administered 2022-06-14: 15 mg via INTRAVENOUS
  Filled 2022-06-14: qty 15

## 2022-06-14 MED ORDER — APIXABAN 5 MG PO TABS
5.0000 mg | ORAL_TABLET | Freq: Two times a day (BID) | ORAL | Status: DC
  Administered 2022-06-15 (×2): 5 mg via ORAL
  Filled 2022-06-14 (×2): qty 1

## 2022-06-14 MED ORDER — DILTIAZEM HCL-DEXTROSE 125-5 MG/125ML-% IV SOLN (PREMIX)
5.0000 mg/h | INTRAVENOUS | Status: DC
  Administered 2022-06-14: 5 mg/h via INTRAVENOUS
  Filled 2022-06-14: qty 125

## 2022-06-14 MED ORDER — ALBUTEROL SULFATE HFA 108 (90 BASE) MCG/ACT IN AERS
1.0000 | INHALATION_SPRAY | Freq: Four times a day (QID) | RESPIRATORY_TRACT | Status: DC | PRN
  Administered 2022-06-14: 2 via RESPIRATORY_TRACT
  Administered 2022-06-15: 1 via RESPIRATORY_TRACT
  Filled 2022-06-14: qty 6.7

## 2022-06-14 MED ORDER — METOPROLOL SUCCINATE ER 50 MG PO TB24
50.0000 mg | ORAL_TABLET | Freq: Every day | ORAL | Status: DC
  Administered 2022-06-14: 50 mg via ORAL
  Filled 2022-06-14: qty 1

## 2022-06-14 MED ORDER — LACTATED RINGERS IV BOLUS
1000.0000 mL | Freq: Once | INTRAVENOUS | Status: AC
  Administered 2022-06-14: 1000 mL via INTRAVENOUS

## 2022-06-14 NOTE — ED Notes (Signed)
Pt. Assisted to bathroom in wheelchair. Pt. Redirected back to her room in wheelchair.

## 2022-06-14 NOTE — ED Provider Notes (Signed)
Patient received from prior provider at 1500.  In short she is presenting with worsening aggressive behavior at home.  She is also having some diarrhea.  Medical workup is in progress. Medical workup grossly reassuring.  Patient had extended delays in her medical workup due to her aggressive behavior.  She required frequent reorientation and a Chemical engineer but still continued to pace around the room and get lightheaded.  She consented to administration of Haldol because she understands it helps her be more comfortable.  After 2 rounds of Haldol administration, patient was resting comfortably in bed.  She unfortunately converted to A-fib with RVR with tachycardia to the 160s.  Dilt bolus with infusion gtt. was initiated and patient converted back to normal sinus rhythm.  After patient converted back to normal sinus rhythm, she maintained sinus rhythm without difficulty on her p.o. medications. I had a prolonged conversation with the patient's daughter and son-in-law. They stated that the patient's sister has her power of attorney but they are out of touch with the sister.  They stated that patient has a formal diagnosis of dementia diagnosed by neurology clinic at [facility]. Extensive evaluation but the family states that they are not able to continue providing care for their mother.  They state that she is too distracted to be at their house.  They state that she is too agitated and that she needs to be placed into a nursing facility.  I stated that this could be engaged on another time and that the emergency department is not the ideal location for completion of this task but family is declining to come pick up the patient. Given her diagnosis of dementia, I do not believe would be safe to discharge the patient without the support of family.  Social work is engaged for further recommendations for safe disposition planning and patient will be kept in reprieve care overnight.  CRITICAL  CARE Performed by: Tretha Sciara Performed for A-fib with RVR with tachycardia to the 160s requiring immediate intervention with diltiazem bolus and infusion Total critical care time: 35 minutes Critical care time was exclusive of separately billable procedures and treating other patients. Critical care was necessary to treat or prevent imminent or life-threatening deterioration.  Critical care was time spent personally by me on the following activities: development of treatment plan with patient and/or surrogate as well as nursing, discussions with consultants, evaluation of patient's response to treatment, examination of patient, obtaining history from patient or surrogate, ordering and performing treatments and interventions, ordering and review of laboratory studies, ordering and review of radiographic studies, pulse oximetry and re-evaluation of patient's condition.      Tretha Sciara, MD 06/14/22 2330

## 2022-06-14 NOTE — ED Notes (Signed)
Pt. Assisted to bathroom with assistance with wheelchair. Pt. Redirected back to her room in wheelchair.

## 2022-06-14 NOTE — ED Notes (Signed)
Patient ambulatory to and from bathroom multiple times. Patient asked about urinary frequency, burning, pain, and/or diarrhea. Patient denies any of these initially. Multiple attempts to collect urine have been made including asking patient to urinate in cup and also putting hats in the toilet to catch urine with no success. Patient reports she takes them out because she can't pee with them in the toilet. Patient states she does not want a urinary catheter to collect urine. Patient now reporting diarrhea. MD aware.

## 2022-06-14 NOTE — Discharge Instructions (Addendum)
Follow-up with her primary care doctor. [facility] will reach out to see how they can assist with finding placement.

## 2022-06-14 NOTE — ED Triage Notes (Signed)
The patient presents from home due to general pain, nausea and vomiting which started yesterday. In addition to the above symptoms, today she developed anxiety.    HX dementia   EMS  142/70 BP 112 HR 99% SPO2 on room air 141 CBG

## 2022-06-14 NOTE — ED Notes (Signed)
Pt. Assisted to bathroom with assistance. Pt. Redirected back to her room.

## 2022-06-14 NOTE — ED Notes (Signed)
Pt is anxious, experiencing palpitations and requesting to eat and drink/ MD made aware.

## 2022-06-14 NOTE — ED Provider Notes (Signed)
[place] EMERGENCY DEPARTMENT AT [facility] Provider Note   CSN: [id number] Arrival date & time: 06/14/22  1119     History  Chief Complaint  Patient presents with   Anxiety   Nausea    Marisa Nunez is a 77 y.o. female.  HPI Patient reports that she is had diarrhea for a number of days.  She reports that she is having frequent stool but not really abdominal pain.  She reports has been occasional vomiting.  She has not had a fever that she knows of.  She reports that she just feels unwell and very anxious as well.  Patient reports that she lives home with her daughter.  Patient denies smoking, she reports a distant smoking history.  She denies alcohol use, reports distant history.  No new medications or recent medication changes per the patient.  Patient daughter contacted and also reviewed the history of present illness.  Patient does live with her daughter.  She has been taking care of her for the past 2 years consistently.  She reports that just over the past few days she has developed some diarrheal illness and occasionally some vomiting.  She reports her mother is also complained now intermittently of feeling short of breath.  She reports she has noted a behavior change.  The patient does have dementia at baseline but she has also become much more agitated and volatile now sometimes saying that she wishes she had a gun because she is going to shoot herself or becoming somewhat violent.  She reports she has been endlessly walking around and difficult to redirect at home.    Home Medications Prior to Admission medications   Medication Sig Start Date End Date Taking? Authorizing Provider  acetaminophen (TYLENOL) 500 MG tablet Take 1 tablet (500 mg total) by mouth every 6 (six) hours as needed for moderate pain, fever or mild pain. 10/24/21   Pokhrel, Corrie Mckusick, MD  albuterol (VENTOLIN HFA) 108 (90 Base) MCG/ACT inhaler Inhale 2 puffs into the lungs every 4 (four) hours as  needed for wheezing or shortness of breath.    [provider]  apixaban (ELIQUIS) 5 MG TABS tablet TAKE 1 TABLET(5 MG) BY MOUTH TWICE DAILY Patient taking differently: Take 5 mg by mouth 2 (two) times daily. 02/28/22   Sherran Needs, NP  atorvastatin (LIPITOR) 80 MG tablet TAKE 1 TABLET(80 MG) BY MOUTH DAILY Patient taking differently: Take 80 mg by mouth daily. 06/25/21   Timothy Lasso, MD  furosemide (LASIX) 20 MG tablet Take 2 tablets (40 mg total) by mouth daily. 02/11/22   Charise Killian, MD  losartan (COZAAR) 50 MG tablet Take 0.5 tablets (25 mg total) by mouth daily. 11/28/21   Mapp, Claudia Desanctis, MD  memantine (NAMENDA) 10 MG tablet Take 10 mg by mouth 2 (two) times daily.    [provider]  metoprolol succinate (TOPROL-XL) 50 MG 24 hr tablet Take 1 tablet (50 mg total) by mouth 2 (two) times daily. 02/27/22   Sherran Needs, NP  Multiple Vitamins-Minerals (MULTIVITAMIN WITH MINERALS) tablet Take 1 tablet by mouth daily.    [provider]  Phenylephrine-Pheniramine-DM [facility] COLD & COUGH PO) Take by mouth.    [provider]  potassium chloride (KLOR-CON M) 10 MEQ tablet TAKE 1 TABLET(10 MEQ) BY MOUTH TWICE DAILY Patient taking differently: Take 10 mEq by mouth 2 (two) times daily. 02/11/22   Charise Killian, MD  sertraline (ZOLOFT) 50 MG tablet TAKE 1 TABLET(50 MG) BY MOUTH  DAILY 05/08/22   [name], MD  spironolactone (ALDACTONE) 25 MG tablet Take 0.5 tablets (12.5 mg total) by mouth daily. 12/10/21 11/30/23  Sande Rives E, PA-C  umeclidinium bromide (INCRUSE ELLIPTA) 62.5 MCG/ACT AEPB Inhale 1 puff into the lungs daily. 04/08/22   Leigh Aurora, DO      Allergies    Influenza virus vaccine, Myrbetriq [mirabegron er], Pneumococcal vaccine, and Ativan [lorazepam]    Review of Systems   Review of Systems  Physical Exam Updated Vital Signs BP (!) 154/93 (BP Location: Left Arm)   Pulse 90   Temp 98.2 F (36.8 C) (Oral)   Resp (!) 24   LMP  (LMP  Unknown)   SpO2 98%  Physical Exam Constitutional:      Comments: Patient is then alert.  She is only agitated moving about the room, getting up and down and rearranging things.  She is directable and interactive.  HENT:     Head: Normocephalic and atraumatic.     Mouth/Throat:     Pharynx: Oropharynx is clear.  Eyes:     Extraocular Movements: Extraocular movements intact.  Cardiovascular:     Rate and Rhythm: Normal rate and regular rhythm.  Pulmonary:     Effort: Pulmonary effort is normal.     Breath sounds: Normal breath sounds.  Abdominal:     General: There is no distension.     Palpations: Abdomen is soft.     Tenderness: There is no abdominal tenderness. There is no guarding.  Musculoskeletal:        General: No swelling. Normal range of motion.     Right lower leg: No edema.     Left lower leg: No edema.  Skin:    General: Skin is warm and dry.  Neurological:     Comments: Patient is alert and hypervigilant.  She seems disproportionately active in the room, folding things and moving around her monitor leads.  There is no motor deficit.  She can get up and stand without difficulty.  Hand and small motions are coordinated and purposeful.  Speach is clear.  Patient does not seem overtly confused however degree of hyperactivity and tangential commentary is consistent dementia.     ED Results / Procedures / Treatments   Labs (all labs ordered are listed, but only abnormal results are displayed) Labs Reviewed  CBC WITH DIFFERENTIAL/PLATELET - Abnormal; Notable for the following components:      Result Value   WBC 10.7 (*)    Hemoglobin 11.4 (*)    Neutro Abs 8.7 (*)    All other components within normal limits  BLOOD GAS, VENOUS - Abnormal; Notable for the following components:   pO2, Ven <31 (*)    Bicarbonate 33.6 (*)    Acid-Base Excess 7.3 (*)    All other components within normal limits  I-STAT CHEM 8, ED - Abnormal; Notable for the following components:    Potassium 3.3 (*)    Hemoglobin 11.6 (*)    HCT 34.0 (*)    All other components within normal limits  RESP PANEL BY RT-PCR (RSV, FLU A&B, COVID)  RVPGX2  LACTIC ACID, PLASMA  BRAIN NATRIURETIC PEPTIDE  COMPREHENSIVE METABOLIC PANEL  ETHANOL  LACTIC ACID, PLASMA  LIPASE, BLOOD  URINALYSIS, ROUTINE W REFLEX MICROSCOPIC  RAPID URINE DRUG SCREEN, HOSP PERFORMED  MAGNESIUM  PHOSPHORUS  TSH  TROPONIN I (HIGH SENSITIVITY)  TROPONIN I (HIGH SENSITIVITY)    EKG EKG Interpretation  Date/Time:  Saturday June 14 2022  11:31:35 EST Ventricular Rate:  95 PR Interval:    QRS Duration: 96 QT Interval:  346 QTC Calculation: 435 R Axis:   68 Text Interpretation: Atrial fibrillation no sig change from previous Confirmed by Charlesetta Shanks [phone]) on 06/14/2022 3:02:48 PM  Radiology CT Head Wo Contrast  Result Date: 06/14/2022 CLINICAL DATA:  Provided history: Mental status change, unknown cause. Altered mental status. EXAM: CT HEAD WITHOUT CONTRAST TECHNIQUE: Contiguous axial images were obtained from the base of the skull through the vertex without intravenous contrast. RADIATION DOSE REDUCTION: This exam was performed according to the departmental dose-optimization program which includes automated exposure control, adjustment of the mA and/or kV according to patient size and/or use of iterative reconstruction technique. COMPARISON:  Head CT 10/17/2021. FINDINGS: Brain: Mild generalized parenchymal atrophy. Moderate patchy and ill-defined hypoattenuation within cerebral white matter, nonspecific but compatible with chronic small vessel disease. There is no acute intracranial hemorrhage. No demarcated cortical infarct. No extra-axial fluid collection. No evidence of an intracranial mass. No midline shift. Skull: No hyperdense vessel.  Atherosclerotic calcifications. Sinuses/Orbits: No mass or acute finding within the imaged orbits. Mild mucosal thickening within the right maxillary sinus at the  imaged levels. IMPRESSION: 1. Mildly motion degraded exam. 2. No evidence of an acute intracranial abnormality. 3. Moderate chronic small vessel ischemic changes within cerebral white matter. 4. Mild generalized parenchymal atrophy. 5. Mild mucosal thickening within the right maxillary sinus at the imaged levels. Electronically Signed   By: Kellie Simmering D.O.   On: 06/14/2022 14:30   DG Chest Port 1 View  Result Date: 06/14/2022 CLINICAL DATA:  sob EXAM: PORTABLE CHEST - 1 VIEW COMPARISON:  04/06/2022 FINDINGS: Cardiac silhouette is unremarkable. No pneumothorax or pleural effusion. The lungs are clear. Aorta is calcified. Prosthetic right shoulder. IMPRESSION: No acute cardiopulmonary process. Electronically Signed   By: Sammie Bench M.D.   On: 06/14/2022 14:09    Procedures Procedures    Medications Ordered in ED Medications  0.9 %  sodium chloride infusion (has no administration in time range)    ED Course/ Medical Decision Making/ A&P Clinical Course as of 06/14/22 1538  Sat Jun 14, 2022  1521 Stable 73 YOF with ccx of diarrhea. Hx of dementia and having worsening agitation. Looking for reversible etiology of delerium.  Reassess.  [CC]    Clinical Course User Index [CC] Tretha Sciara, MD                             Medical Decision Making Amount and/or Complexity of Data Reviewed Labs: ordered. Radiology: ordered.  Risk Prescription drug management.   Patient has a baseline dementia but has had acceleration of agitation and now symptoms of diarrhea, vomiting and shortness of breath.  Differential diagnosis includes acute infectious etiology\metabolic etiology with worsening mental symptoms.  Describes symptoms are GI and respiratory.  Will proceed with diagnostic evaluation patient is not hypoxic.  Pressures are normotensive to mildly elevated.  This time she is not exhibiting sepsis symptoms.  Will continue to monitor and proceed with workup.  [name] was  very adamant that the patient gets more agitated and escalates with Ativan and will start hallucinating.  They had been given respite all to start at home to help with dementia and agitation.   Plan to rule out infectious etiology for patient's symptoms of vomiting, diarrhea and shortness of breath.  Sepsis.  Patient does not have lactic acidosis or fever.  Troponin  is pending, EKG does not appear ischemic.  Lower suspicion for ACS as underlying cause of patient's shortness of breath and worsening agitation with dementia.  Dr. Oswald Hillock to follow-up on the remainder of diagnostic evaluation for final disposition.          Final Clinical Impression(s) / ED Diagnoses Final diagnoses:  Moderate dementia with other behavioral disturbance, unspecified dementia type (Melbeta)  Vomiting and diarrhea  Shortness of breath    Rx / DC Orders ED Discharge Orders     None         Charlesetta Shanks, MD 06/14/22 1538

## 2022-06-14 NOTE — ED Notes (Signed)
Pt. Attempted urine specimen but unable to give urine sample.

## 2022-06-14 NOTE — ED Notes (Signed)
Pt. Assisted to bathroom in wheelchair, pt. Redirected back to her room in wheelchair.

## 2022-06-15 LAB — GASTROINTESTINAL PANEL BY PCR, STOOL (REPLACES STOOL CULTURE)

## 2022-06-15 LAB — C DIFFICILE QUICK SCREEN W PCR REFLEX
C Diff antigen: NEGATIVE
C Diff interpretation: NOT DETECTED
C Diff toxin: NEGATIVE

## 2022-06-15 MED ORDER — QUETIAPINE FUMARATE 100 MG PO TABS
100.0000 mg | ORAL_TABLET | Freq: Every day | ORAL | Status: DC
  Administered 2022-06-15: 100 mg via ORAL
  Filled 2022-06-15: qty 1

## 2022-06-15 MED ORDER — METOPROLOL TARTRATE 25 MG PO TABS
50.0000 mg | ORAL_TABLET | Freq: Once | ORAL | Status: AC
  Administered 2022-06-15: 50 mg via ORAL
  Filled 2022-06-15: qty 2

## 2022-06-15 NOTE — ED Provider Notes (Addendum)
Physical therapy evaluation consult placed for placement.   Marisa Sorrow, MD 06/15/22 1251  Daughter is now here and saying that she wants to take her home.  Patient was medically cleared for discharge home before.  But family did not want to take her home at that time.  So they were looking for placement.  Patient stable for discharge home.    Marisa Sorrow, MD 06/15/22 1455

## 2022-06-15 NOTE — Evaluation (Signed)
One time Physical Therapy Evaluation Patient Details Name: Marisa Nunez MRN: [id number] DOB: November 16, 1945 Today's Date: 06/15/2022  History of Present Illness  77 yo female presents with aggressive behavior at home and  also having some diarrhea.  PMH of CHF, COPD, HTN, HLD, paroxysmal A-fib on Eliquis, dementia, breast cancer on chemo, covid  Clinical Impression  Patient evaluated by Physical Therapy with no further acute PT needs identified. All education has been completed and the patient has no further questions.  On initial attempt to see pt she had gone missing from room. NT asked about pt whereabouts, NT and RN were able to locate pt and pt returned to room to complete eval. See below for mobility Pt needs supervision d/t her  dementia and safety concerns related to cognitive status. Not durther PT indicated   See below for any follow-up Physical Therapy or equipment needs. PT is signing off. Thank you for this referral.        Recommendations for follow up therapy are one component of a multi-disciplinary discharge planning process, led by the attending physician.  Recommendations may be updated based on patient status, additional functional criteria and insurance authorization.  Follow Up Recommendations No PT follow up      Assistance Recommended at Discharge Frequent or constant Supervision/Assistance (d/t dementia)  Patient can return home with the following  Direct supervision/assist for medications management;Direct supervision/assist for financial management;Assistance with cooking/housework    Equipment Recommendations None recommended by PT  Recommendations for Other Services       Functional Status Assessment Patient has not had a recent decline in their functional status     Precautions / Restrictions Precautions Precautions: None Restrictions Weight Bearing Restrictions: No      Mobility  Bed Mobility Overal bed mobility: Independent              General bed mobility comments: pt is able to roll, exit bottom of stretcher as well as side egress; able to enter stretcher from foot on her knees and turn from prone to supine    Transfers Overall transfer level: Independent Equipment used: None                    Ambulation/Gait Ambulation/Gait assistance: Independent Gait Distance (Feet): 30 Feet Assistive device: None Gait Pattern/deviations: WFL(Within Functional Limits)       General Gait Details: pt without LOB for amb into hallway and back to room, no LOB, denies falls  Stairs            Wheelchair Mobility    Modified Rankin (Stroke Patients Only)       Balance   Sitting-balance support: No upper extremity supported, Feet unsupported Sitting balance-Leahy Scale: Normal     Standing balance support: No upper extremity supported Standing balance-Leahy Scale: Good               High level balance activites: Side stepping, Turns, Backward walking, Head turns High Level Balance Comments: no LOB with above             Pertinent Vitals/Pain Pain Assessment Pain Assessment: No/denies pain    Home Living Family/patient expects to be discharged to:: Private residence Living Arrangements: Other (Comment);Children (grand-dtr, dtr) Available Help at Discharge: Family;Available PRN/intermittently Type of Home: House Home Access: Stairs to enter   [employer] of Steps: 4   Home Layout: One level Home Equipment: None Additional Comments: some info above taken from previous notes; pt reports she resides with  her 46 yo mother    Prior Function               Mobility Comments: pt reports independence       Hand Dominance        Extremity/Trunk Assessment   Upper Extremity Assessment Upper Extremity Assessment: Overall WFL for tasks assessed    Lower Extremity Assessment Lower Extremity Assessment: Overall WFL for tasks assessed    Cervical / Trunk  Assessment Cervical / Trunk Assessment: Normal  Communication   Communication: No difficulties  Cognition Arousal/Alertness: Awake/alert Behavior During Therapy: WFL for tasks assessed/performed Overall Cognitive Status: History of cognitive impairments - at baseline                                 General Comments: pt alert, oriented to place, self, unsure of date; follows commands without difficulty        General Comments      Exercises     Assessment/Plan    PT Assessment Patient does not need any further PT services  PT Problem List         PT Treatment Interventions      PT Goals (Current goals can be found in the Care Plan section)  Acute Rehab PT Goals PT Goal Formulation: All assessment and education complete, DC therapy    Frequency       Co-evaluation               AM-PAC PT "6 Clicks" Mobility  Outcome Measure Help needed turning from your back to your side while in a flat bed without using bedrails?: None Help needed moving from lying on your back to sitting on the side of a flat bed without using bedrails?: None Help needed moving to and from a bed to a chair (including a wheelchair)?: None Help needed standing up from a chair using your arms (e.g., wheelchair or bedside chair)?: None Help needed to walk in hospital room?: None Help needed climbing 3-5 steps with a railing? : None 6 Click Score: 24    End of Session   Activity Tolerance: Patient tolerated treatment well Patient left: in bed;with call bell/phone within reach   PT Visit Diagnosis: Other abnormalities of gait and mobility (R26.89)    Time: 1356-1406 PT Time Calculation (min) (ACUTE ONLY): 10 min   Charges:   PT Evaluation $PT Eval Low Complexity: Reynolds, PT  Acute Rehab Dept [facility]) [phone]  [facility] Weekend Pager [facility] only)  [phone]  06/15/2022   [facility] 06/15/2022, 3:48 PM

## 2022-06-15 NOTE — ED Notes (Signed)
Pt missing from room at this time. History of wandering off without giving prior knowledge. She was found sitting in an empty pt room. Bed alarm placed in room in bed.

## 2022-06-15 NOTE — ED Notes (Signed)
Pt continues to get out of bed/ MD aware of HR

## 2022-06-15 NOTE — Progress Notes (Addendum)
Attempted to contact pt's daughter - left HIPAA Compliant voicemail.   Addend @ 10:48 AM Attempted to contact pt's daughter again - phone is ringing once and going to voicemail. left another HIPAA Compliant message.   Addend @ 1:00 PM Attempted to contact daughter without success. Phone is still ringing once and going to voicemail.   Addend @ 2:28 PM Spoke to the pt's daughter who informed that they'd worked with DSS but stated pt was not eligible for Medicaid. This CSW inquired about whether they'd discussed spend down with her. Pt's daughter was uncertain. This CSW explained that the pt's presenting issue is dementia and that pt is not a candidate for rehab. This CSW explained that the pt may benefit from [place] placement and encouraged to follow back up with DSS to discuss specifics of Medicaid eligibility and what options can be taken. TOC will refer the pt to [facility] and have [facility] reach out to assist the pt and family due to the pt not having Medicaid. This CSW also attached resources to the pt's AVS. RN notified. No further TOC needs. TOC signing off.

## 2022-06-19 ENCOUNTER — Other Ambulatory Visit: Payer: Self-pay

## 2022-06-22 ENCOUNTER — Ambulatory Visit
Admission: RE | Admit: 2022-06-22 | Discharge: 2022-06-22 | Disposition: A | Discharge status: Home / Self Care | Payer: Medicare Other | Source: Ambulatory Visit | Attending: Nurse Practitioner | Admitting: Nurse Practitioner

## 2022-06-22 VITALS — BP 121/76 | HR 69 | Temp 98.0°F | Resp 18

## 2022-06-22 DIAGNOSIS — N3001 Acute cystitis with hematuria: Secondary | ICD-10-CM | POA: Diagnosis present

## 2022-06-22 LAB — POCT URINALYSIS DIP (MANUAL ENTRY)
Bilirubin, UA: NEGATIVE
Glucose, UA: NEGATIVE mg/dL
Ketones, POC UA: NEGATIVE mg/dL
Nitrite, UA: NEGATIVE
Protein Ur, POC: NEGATIVE mg/dL
Spec Grav, UA: 1.025 (ref 1.010–1.025)
Urobilinogen, UA: 0.2 E.U./dL
pH, UA: 6 (ref 5.0–8.0)

## 2022-06-22 MED ORDER — CEPHALEXIN 500 MG PO CAPS: 500.0000 mg | ORAL_CAPSULE | Freq: Two times a day (BID) | ORAL | 0 refills | Status: AC

## 2022-06-22 NOTE — ED Provider Notes (Signed)
[facility]    CSN: [id number] Arrival date & time: 06/22/22  1105      History   Chief Complaint Chief Complaint  Patient presents with   Back Pain    She has been having back pain for the past week. - Entered by patient   Nausea   leg cramping    HPI Marisa Nunez is a 77 y.o. female presents for evaluation of back pain, nausea, dysuria.  Patient is accompanied by son-in-law who helps to augment history.  She does have a history of dementia.  Son-in-law reports 3 to 4 weeks of her reporting leg cramping, nausea, and back pain.  patient does report some urinary burning and frequency.  No hematuria, fevers, vomiting, flank pain.  No history of recurrent UTIs or history of pyelonephritis.  She has been taking aspirin and Tylenol.  Patient was admitted to the hospital on 2/17 through 2/18 for increasing dementia with vomiting and diarrhea.  She did have an episode of A-fib RVR while there and has been put on Eliquis.  At that time she had a CT abdomen pelvis with no acute intra-abdominal or intrapelvic abnormality.  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  No fractures.  She did have potassium and magnesium replacement while in the hospital.  She has a follow-up appointment with her internal medicine doctor in a couple of weeks.  No other concerns at this time.   Back Pain Associated symptoms: dysuria     Past Medical History:  Diagnosis Date   Anxiety    Breast cancer ([place])    Chronic combined systolic and diastolic CHF (congestive heart failure) ([place])    a. Echo 11/2021: LVEF of 40-45% with global hypokinesis, mildly reduced RV systolic function, and moderate MR   COPD (chronic obstructive pulmonary disease) (HCC)    Dementia (HCC)    Depression    HTN (hypertension)    Paroxysmal atrial fibrillation ([place]) 2017   Stroke [facility])     Patient Active Problem List   Diagnosis Date Noted   Persistent atrial  fibrillation (HCC)    COPD (chronic obstructive pulmonary disease) ([place]) 10/19/2021   Dementia ([place]) 10/16/2021   Overactive bladder 05/24/2021   Vaginal lesion 05/24/2021   History of TIA 10/30/2020   History of cerebral infarction 10/29/2020   History of right breast cancer 02/25/2018   History of tobacco use 05/06/2017   Mixed hyperlipidemia 05/06/2017   Osteopenia of multiple sites 05/06/2017   Essential hypertension 10/25/2015   PAF (paroxysmal atrial fibrillation) ([place]) 10/25/2015    Past Surgical History:  Procedure Laterality Date   ABDOMINAL HYSTERECTOMY     CARDIOVERSION N/A 02/19/2022   Procedure: CARDIOVERSION;  Surgeon: Freada Bergeron, MD;  Location: [place];  Service: Cardiovascular;  Laterality: N/A;   REVERSE SHOULDER ARTHROPLASTY Right 10/21/2021   Procedure: RIGHT REVERSE TOTAL SHOULDER ARTHROPLASTY;  Surgeon: Hiram Gash, MD;  Location: [place];  Service: Orthopedics;  Laterality: Right;  allen bed, spider, tornier (rep aware), open shoulder tray.    OB History   No obstetric history on file.      Home Medications    Prior to Admission medications   Medication Sig Start Date End Date Taking? Authorizing Provider  cephALEXin (KEFLEX) 500 MG capsule Take 1 capsule (500 mg total) by mouth 2 (two) times daily for 7 days. 06/22/22 06/29/22 Yes Melynda Ripple, NP  acetaminophen (TYLENOL) 500 MG tablet Take 1 tablet (  500 mg total) by mouth every 6 (six) hours as needed for moderate pain, fever or mild pain. 10/24/21   Pokhrel, Corrie Mckusick, MD  albuterol (VENTOLIN HFA) 108 (90 Base) MCG/ACT inhaler Inhale 2 puffs into the lungs every 4 (four) hours as needed for wheezing or shortness of breath.    [provider]  apixaban (ELIQUIS) 5 MG TABS tablet TAKE 1 TABLET(5 MG) BY MOUTH TWICE DAILY Patient taking differently: Take 5 mg by mouth 2 (two) times daily. 02/28/22   Sherran Needs, NP  atorvastatin (LIPITOR) 80 MG tablet TAKE 1 TABLET(80 MG) BY MOUTH  DAILY Patient not taking: Reported on 06/15/2022 06/25/21   Timothy Lasso, MD  buPROPion [facility]) 75 MG tablet Take 75 mg by mouth 2 (two) times daily.    [provider]  furosemide (LASIX) 20 MG tablet Take 2 tablets (40 mg total) by mouth daily. 02/11/22   Charise Killian, MD  losartan (COZAAR) 50 MG tablet Take 0.5 tablets (25 mg total) by mouth daily. 11/28/21   Mapp, Claudia Desanctis, MD  memantine (NAMENDA) 10 MG tablet Take 10 mg by mouth 2 (two) times daily.    [provider]  metoprolol succinate (TOPROL-XL) 50 MG 24 hr tablet Take 1 tablet (50 mg total) by mouth 2 (two) times daily. Patient taking differently: Take by mouth 2 (two) times daily. 50 mg q am and 25 mg at night 02/27/22   Sherran Needs, NP  Multiple Vitamins-Minerals (MULTIVITAMIN WITH MINERALS) tablet Take 1 tablet by mouth daily.    [provider]  potassium chloride (KLOR-CON M) 10 MEQ tablet TAKE 1 TABLET(10 MEQ) BY MOUTH TWICE DAILY Patient taking differently: Take 10 mEq by mouth 2 (two) times daily. 02/11/22   Charise Killian, MD  risperiDONE (RISPERDAL) 0.5 MG tablet Take 0.5 mg by mouth daily.    [provider]  sertraline (ZOLOFT) 50 MG tablet TAKE 1 TABLET(50 MG) BY MOUTH DAILY Patient taking differently: Take 50 mg by mouth daily. TAKE 1 TABLET(50 MG) BY MOUTH DAILY 05/08/22   [name], MD  spironolactone (ALDACTONE) 25 MG tablet Take 0.5 tablets (12.5 mg total) by mouth daily. 12/10/21 11/30/23  Sande Rives E, PA-C  umeclidinium bromide (INCRUSE ELLIPTA) 62.5 MCG/ACT AEPB Inhale 1 puff into the lungs daily. 04/08/22   Leigh Aurora, DO    Family History Family History  Problem Relation Age of Onset   Arrhythmia Father     Social History Social History   Tobacco Use   Smoking status: Former    Packs/day: 0.50    Types: Cigarettes  Vaping Use   Vaping Use: Never used  Substance Use Topics   Alcohol use: Not Currently    Alcohol/week: 6.0 standard drinks of alcohol     Types: 6 Cans of beer per week   Drug use: Not Currently     Allergies   Influenza virus vaccine, Myrbetriq [mirabegron er], Pneumococcal vaccine, and Ativan [lorazepam]   Review of Systems Review of Systems  Genitourinary:  Positive for dysuria.  Musculoskeletal:  Positive for back pain.     Physical Exam Triage Vital Signs ED Triage Vitals [06/22/22 1136]  Enc Vitals Group     BP 121/76     Pulse Rate 69     Resp 18     Temp 98 F (36.7 C)     Temp Source Oral     SpO2 97 %     Weight      Height  Head Circumference      Peak Flow      Pain Score      Pain Loc      Pain Edu?      Excl. in [place]?    No data found.  Updated Vital Signs BP 121/76 (BP Location: Left Arm)   Pulse 69   Temp 98 F (36.7 C) (Oral)   Resp 18   LMP  (LMP Unknown)   SpO2 97%   Visual Acuity Right Eye Distance:   Left Eye Distance:   Bilateral Distance:    Right Eye Near:   Left Eye Near:    Bilateral Near:     Physical Exam Vitals and nursing note reviewed.  Constitutional:      Appearance: Normal appearance.  HENT:     Head: Normocephalic and atraumatic.  Eyes:     Pupils: Pupils are equal, round, and reactive to light.  Cardiovascular:     Rate and Rhythm: Normal rate.  Pulmonary:     Effort: Pulmonary effort is normal.  Abdominal:     Tenderness: There is no right CVA tenderness or left CVA tenderness.  Musculoskeletal:     Lumbar back: Tenderness present. No swelling, deformity, signs of trauma, lacerations, spasms or bony tenderness. Normal range of motion.       Back:     Comments: Strength 5 out of 5 bilateral lower extremities  Skin:    General: Skin is warm and dry.  Neurological:     General: No focal deficit present.     Mental Status: She is alert and oriented to person, place, and time.  Psychiatric:        Mood and Affect: Mood normal.        Behavior: Behavior normal.      UC Treatments / Results  Labs (all labs ordered are listed, but  only abnormal results are displayed) Labs Reviewed  POCT URINALYSIS DIP (MANUAL ENTRY) - Abnormal; Notable for the following components:      Result Value   Color, UA light yellow (*)    Clarity, UA hazy (*)    Blood, UA large (*)    Leukocytes, UA Trace (*)    All other components within normal limits  URINE CULTURE    EKG   Radiology No results found.  Procedures Procedures (including critical care time)  Medications Ordered in UC Medications - No data to display  Initial Impression / Assessment and Plan / UC Course  I have reviewed the triage vital signs and the nursing notes.  Pertinent labs & imaging results that were available during my care of the patient were reviewed by me and considered in my medical decision making (see chart for details).     Gust exam and symptoms with patient and his son-in-law. Will start Keflex for UTI and send urine culture Discussed likely arthritis of spine causing back pain/musculoskeletal cause.  Continue Tylenol and over-the-counter ibuprofen as well as heat. Rest and fluids Follow-up with the internal medicine doctor scheduled appointment in a couple of weeks ER precautions reviewed and patient and family verbalized understanding Final Clinical Impressions(s) / UC Diagnoses   Final diagnoses:  Acute cystitis with hematuria     Discharge Instructions      Start Keflex twice a for 7 days The clinical contact you with results of the urine culture if it is positive Fluids Follow-up with your PCP 2 to 3 days for recheck Please go to the emergency room for  any worsening symptoms   ED Prescriptions     Medication Sig Dispense Auth. Provider   cephALEXin (KEFLEX) 500 MG capsule Take 1 capsule (500 mg total) by mouth 2 (two) times daily for 7 days. 14 capsule Melynda Ripple, NP      PDMP not reviewed this encounter.   Melynda Ripple, NP 06/22/22 [phone]

## 2022-06-22 NOTE — ED Triage Notes (Signed)
Pt c/o nausea, lower back pain, and bilateral leg cramping for a few weeks.   Home interventions: asa, tylenol

## 2022-06-22 NOTE — Discharge Instructions (Signed)
Start Keflex twice a for 7 days The clinical contact you with results of the urine culture if it is positive Fluids Follow-up with your PCP 2 to 3 days for recheck Please go to the emergency room for any worsening symptoms

## 2022-06-23 LAB — URINE CULTURE: Culture: <10000 — AB

## 2022-06-26 ENCOUNTER — Encounter: Payer: Medicare Other | Admitting: Student

## 2022-06-26 ENCOUNTER — Encounter: Payer: Self-pay | Admitting: Student

## 2022-06-26 ENCOUNTER — Ambulatory Visit (INDEPENDENT_AMBULATORY_CARE_PROVIDER_SITE_OTHER): Payer: Medicare Other | Admitting: Student

## 2022-06-26 ENCOUNTER — Other Ambulatory Visit: Payer: Self-pay

## 2022-06-26 VITALS — BP 125/67 | HR 72 | Temp 98.1°F | Ht 64.0 in | Wt 116.2 lb

## 2022-06-26 DIAGNOSIS — R634 Abnormal weight loss: Secondary | ICD-10-CM | POA: Diagnosis not present

## 2022-06-26 DIAGNOSIS — I959 Hypotension, unspecified: Secondary | ICD-10-CM | POA: Diagnosis not present

## 2022-06-26 DIAGNOSIS — J449 Chronic obstructive pulmonary disease, unspecified: Secondary | ICD-10-CM

## 2022-06-26 NOTE — Patient Instructions (Signed)
Thank you so much for coming to the clinic today!   It was a pleasure seeing you guys again! PLEASE make sure you drink enough water throughout the day. I am also checking some labs, and we will check how well your lungs are working.   If you have any questions please feel free to the call the clinic at anytime at [phone]. It was a pleasure seeing you!  Best, Dr. Sanjuana Mae

## 2022-06-26 NOTE — Telephone Encounter (Signed)
Done

## 2022-06-27 DIAGNOSIS — I959 Hypotension, unspecified: Secondary | ICD-10-CM | POA: Insufficient documentation

## 2022-06-27 DIAGNOSIS — R634 Abnormal weight loss: Secondary | ICD-10-CM | POA: Insufficient documentation

## 2022-06-27 NOTE — Assessment & Plan Note (Signed)
Patient endorses having a 15 pound weight loss in about 3 months.  She states that her diet is well, however given patient's advanced dementia it is difficult to tell how accurate these were.  Daughter is also bedside and says that she eats a good amount, however could be eating more.  Have advised daughter to supervise patient while eating and and next time she returns to the clinic to compare that weight to the weight today of 116 pounds (52.7 kg).  She does not have any worrying symptoms such as bloody diarrhea, night sweats, or lymphadenopathy.

## 2022-06-27 NOTE — Progress Notes (Signed)
CC: Low blood pressure  HPI:  [name] is a 77 y.o. female living with a history stated below and presents today for low blood pressure. Please see problem based assessment and plan for additional details.  Past Medical History:  Diagnosis Date   Anxiety    Breast cancer ([place])    Chronic combined systolic and diastolic CHF (congestive heart failure) ([place])    a. Echo 11/2021: LVEF of 40-45% with global hypokinesis, mildly reduced RV systolic function, and moderate MR   COPD (chronic obstructive pulmonary disease) (HCC)    Dementia (HCC)    Depression    HTN (hypertension)    Paroxysmal atrial fibrillation ([place]) 2017   Stroke [facility])     Current Outpatient Medications on File Prior to Visit  Medication Sig Dispense Refill   acetaminophen (TYLENOL) 500 MG tablet Take 1 tablet (500 mg total) by mouth every 6 (six) hours as needed for moderate pain, fever or mild pain. 30 tablet 0   albuterol (VENTOLIN HFA) 108 (90 Base) MCG/ACT inhaler Inhale 2 puffs into the lungs every 4 (four) hours as needed for wheezing or shortness of breath.     apixaban (ELIQUIS) 5 MG TABS tablet TAKE 1 TABLET(5 MG) BY MOUTH TWICE DAILY (Patient taking differently: Take 5 mg by mouth 2 (two) times daily.) 42 tablet 0   atorvastatin (LIPITOR) 80 MG tablet TAKE 1 TABLET(80 MG) BY MOUTH DAILY (Patient not taking: Reported on 06/15/2022) 90 tablet 3   buPROPion (WELLBUTRIN) 75 MG tablet Take 75 mg by mouth 2 (two) times daily.     cephALEXin (KEFLEX) 500 MG capsule Take 1 capsule (500 mg total) by mouth 2 (two) times daily for 7 days. 14 capsule 0   furosemide (LASIX) 20 MG tablet Take 2 tablets (40 mg total) by mouth daily. 90 tablet 3   losartan (COZAAR) 50 MG tablet Take 0.5 tablets (25 mg total) by mouth daily. 90 tablet 3   memantine (NAMENDA) 10 MG tablet Take 10 mg by mouth 2 (two) times daily.     metoprolol succinate (TOPROL-XL) 50 MG 24 hr tablet Take 1 tablet (50 mg total) by mouth 2 (two) times  daily. (Patient taking differently: Take by mouth 2 (two) times daily. 50 mg q am and 25 mg at night) 60 tablet 3   Multiple Vitamins-Minerals (MULTIVITAMIN WITH MINERALS) tablet Take 1 tablet by mouth daily.     potassium chloride (KLOR-CON M) 10 MEQ tablet TAKE 1 TABLET(10 MEQ) BY MOUTH TWICE DAILY (Patient taking differently: Take 10 mEq by mouth 2 (two) times daily.) 90 tablet 2   risperiDONE (RISPERDAL) 0.5 MG tablet Take 0.5 mg by mouth daily.     sertraline (ZOLOFT) 50 MG tablet TAKE 1 TABLET(50 MG) BY MOUTH DAILY (Patient taking differently: Take 50 mg by mouth daily. TAKE 1 TABLET(50 MG) BY MOUTH DAILY) 90 tablet 0   spironolactone (ALDACTONE) 25 MG tablet Take 0.5 tablets (12.5 mg total) by mouth daily. 90 tablet 3   umeclidinium bromide (INCRUSE ELLIPTA) 62.5 MCG/ACT AEPB Inhale 1 puff into the lungs daily. 30 each 0   No current facility-administered medications on file prior to visit.    Family History  Problem Relation Age of Onset   Arrhythmia Father     Social History   Socioeconomic History   Marital status: Single    Spouse name: Not on file   Number of children: Not on file   Years of education: Not on file   Highest  education level: Not on file  Occupational History   Not on file  Tobacco Use   Smoking status: Former    Packs/day: 0.50    Types: Cigarettes   Smokeless tobacco: Not on file  Vaping Use   Vaping Use: Never used  Substance and Sexual Activity   Alcohol use: Not Currently    Alcohol/week: 6.0 standard drinks of alcohol    Types: 6 Cans of beer per week   Drug use: Not Currently   Sexual activity: Yes  Other Topics Concern   Not on file  Social History Narrative   Not on file   Social Determinants of Health   Financial Resource Strain: Low Risk  (06/26/2022)   Overall Financial Resource Strain (CARDIA)    Difficulty of Paying Living Expenses: Not hard at all  Food Insecurity: No Food Insecurity (06/26/2022)   Hunger Vital Sign    Worried  About Running Out of Food in the Last Year: Never true    Ran Out of Food in the Last Year: Never true  Transportation Needs: No Transportation Needs (06/26/2022)   PRAPARE - Hydrologist (Medical): No    Lack of Transportation (Non-Medical): No  Physical Activity: Sufficiently Active (06/26/2022)   Exercise Vital Sign    Days of Exercise per Week: 7 days    Minutes of Exercise per Session: 50 min  Stress: No Stress Concern Present (06/26/2022)   [place]    Feeling of Stress : Not at all  Social Connections: Socially Isolated (06/26/2022)   Social Connection and Isolation Panel [NHANES]    Frequency of Communication with Friends and Family: More than three times a week    Frequency of Social Gatherings with Friends and Family: More than three times a week    Attends Religious Services: Never    Marine scientist or Organizations: No    Attends Archivist Meetings: Never    Marital Status: Divorced  Human resources officer Violence: Not At Risk (06/26/2022)   Humiliation, Afraid, Rape, and Kick questionnaire    Fear of Current or Ex-Partner: No    Emotionally Abused: No    Physically Abused: No    Sexually Abused: No    Review of Systems: ROS negative except for what is noted on the assessment and plan.  Vitals:   06/26/22 1340 06/26/22 1351 06/26/22 1358 06/26/22 1419  BP: 95/73 (!) 90/47 93/61 125/67  Pulse: 92 71 79 72  Temp: 98.1 F (36.7 C)     TempSrc: Oral     Weight: 116 lb 3.2 oz (52.7 kg)     Height: '5\' 4"'$  (1.626 m)       Physical Exam: Constitutional: pleasant, well-appearing female, in no acute distress Cardiovascular: regular rate and rhythm, no m/r/g Pulmonary/Chest: normal work of breathing on room air, lungs clear to auscultation bilaterally Abdominal: soft, non-tender, non-distended MSK: normal bulk and tone Neurological: alert & oriented x 3, 5/5  strength in bilateral upper and lower extremities, normal gait, occasionally asks repeat questions but otherwise able to communicate effectively and answer questions well  Assessment & Plan:   Hypotension Patient was seen by her neurologist for advanced dementia, and it was noted her blood pressure was 73/50.  Daughter is also in the room and able to provide more history.  For the last month or so she has been having these dizzy spells and nauseated.  Her diet has been  doing well, however daughter is unsure how much water she actually is drinking and sometimes it is tough to get her to actually drink the water.  She has not had any syncopal episodes. Of note, patient is on Lasix 20 mg twice daily, metoprolol 50 mg twice daily, and spironolactone 12.5 mg.  Initial blood pressure while in the clinic was 93/77 however after giving her 4-5 water bottles, blood pressure increased to 125/67 which seems to indicate that this was likely hypovolemic and dehydration is playing a significant role, especially since patient is taking Lasix for her heart failure.  Plan: - Encourage patient and daughter to increase fluid intake - Will obtain a BMP today to rule out any electrolyte abnormalities that need to be replenished  COPD (chronic obstructive pulmonary disease) ([place]) Patient has never had formal pulmonary function test done, and at this point she denies any shortness of breath or symptoms of exacerbation of her COPD.  Will place referral for further evaluation as patient does have significant cigarette use history.  Weight loss Patient endorses having a 15 pound weight loss in about 3 months.  She states that her diet is well, however given patient's advanced dementia it is difficult to tell how accurate these were.  Daughter is also bedside and says that she eats a good amount, however could be eating more.  Have advised daughter to supervise patient while eating and and next time she returns to the clinic to  compare that weight to the weight today of 116 pounds (52.7 kg).  She does not have any worrying symptoms such as bloody diarrhea, night sweats, or lymphadenopathy.  Patient discussed with Dr. Thomasene Ripple, M.D. [facility], PGY-1 Phone: [phone] Date 06/27/2022 Time 9:03 AM

## 2022-06-27 NOTE — Assessment & Plan Note (Signed)
Patient was seen by her neurologist for advanced dementia, and it was noted her blood pressure was 73/50.  Daughter is also in the room and able to provide more history.  For the last month or so she has been having these dizzy spells and nauseated.  Her diet has been doing well, however daughter is unsure how much water she actually is drinking and sometimes it is tough to get her to actually drink the water.  She has not had any syncopal episodes. Of note, patient is on Lasix 20 mg twice daily, metoprolol 50 mg twice daily, and spironolactone 12.5 mg.  Initial blood pressure while in the clinic was 93/77 however after giving her 4-5 water bottles, blood pressure increased to 125/67 which seems to indicate that this was likely hypovolemic and dehydration is playing a significant role, especially since patient is taking Lasix for her heart failure.  Plan: - Encourage patient and daughter to increase fluid intake - Will obtain a BMP today to rule out any electrolyte abnormalities that need to be replenished

## 2022-06-27 NOTE — Assessment & Plan Note (Signed)
Patient has never had formal pulmonary function test done, and at this point she denies any shortness of breath or symptoms of exacerbation of her COPD.  Will place referral for further evaluation as patient does have significant cigarette use history.

## 2022-06-28 LAB — BMP8+ANION GAP
Anion Gap: 18 mmol/L (ref 10.0–18.0)
BUN/Creatinine Ratio: 18 (ref 12–28)
BUN: 13 mg/dL (ref 8–27)
CO2: 22 mmol/L (ref 20–29)
Calcium: 9 mg/dL (ref 8.7–10.3)
Chloride: 91 mmol/L — ABNORMAL LOW (ref 96–106)
Creatinine, Ser: 0.72 mg/dL (ref 0.57–1.00)
Glucose: 84 mg/dL (ref 70–99)
Potassium: 3.1 mmol/L — ABNORMAL LOW (ref 3.5–5.2)
Sodium: 131 mmol/L — ABNORMAL LOW (ref 134–144)
eGFR: 88 mL/min/{1.73_m2} (ref >59)

## 2022-06-30 ENCOUNTER — Observation Stay (HOSPITAL_COMMUNITY)
Admission: EM | Admit: 2022-06-30 | Discharge: 2022-07-01 | Discharge status: Against Medical Advice | Payer: Medicare Other | Attending: Family Medicine | Admitting: Family Medicine

## 2022-06-30 ENCOUNTER — Other Ambulatory Visit: Payer: Self-pay

## 2022-06-30 ENCOUNTER — Encounter (HOSPITAL_COMMUNITY): Payer: Self-pay | Admitting: Family Medicine

## 2022-06-30 DIAGNOSIS — Z7901 Long term (current) use of anticoagulants: Secondary | ICD-10-CM | POA: Insufficient documentation

## 2022-06-30 DIAGNOSIS — R110 Nausea: Secondary | ICD-10-CM

## 2022-06-30 DIAGNOSIS — K922 Gastrointestinal hemorrhage, unspecified: Secondary | ICD-10-CM | POA: Diagnosis not present

## 2022-06-30 DIAGNOSIS — I110 Hypertensive heart disease with heart failure: Secondary | ICD-10-CM | POA: Insufficient documentation

## 2022-06-30 DIAGNOSIS — E782 Mixed hyperlipidemia: Secondary | ICD-10-CM | POA: Diagnosis present

## 2022-06-30 DIAGNOSIS — E860 Dehydration: Secondary | ICD-10-CM | POA: Diagnosis not present

## 2022-06-30 DIAGNOSIS — F0390 Unspecified dementia without behavioral disturbance: Secondary | ICD-10-CM | POA: Diagnosis not present

## 2022-06-30 DIAGNOSIS — D649 Anemia, unspecified: Secondary | ICD-10-CM

## 2022-06-30 DIAGNOSIS — I4819 Other persistent atrial fibrillation: Secondary | ICD-10-CM | POA: Diagnosis present

## 2022-06-30 DIAGNOSIS — Z8673 Personal history of transient ischemic attack (TIA), and cerebral infarction without residual deficits: Secondary | ICD-10-CM | POA: Diagnosis not present

## 2022-06-30 DIAGNOSIS — J449 Chronic obstructive pulmonary disease, unspecified: Secondary | ICD-10-CM | POA: Insufficient documentation

## 2022-06-30 DIAGNOSIS — I480 Paroxysmal atrial fibrillation: Secondary | ICD-10-CM | POA: Diagnosis not present

## 2022-06-30 DIAGNOSIS — R195 Other fecal abnormalities: Secondary | ICD-10-CM | POA: Diagnosis present

## 2022-06-30 DIAGNOSIS — Z853 Personal history of malignant neoplasm of breast: Secondary | ICD-10-CM | POA: Diagnosis not present

## 2022-06-30 DIAGNOSIS — I5042 Chronic combined systolic (congestive) and diastolic (congestive) heart failure: Secondary | ICD-10-CM | POA: Diagnosis not present

## 2022-06-30 DIAGNOSIS — Z87891 Personal history of nicotine dependence: Secondary | ICD-10-CM

## 2022-06-30 DIAGNOSIS — I4891 Unspecified atrial fibrillation: Secondary | ICD-10-CM

## 2022-06-30 DIAGNOSIS — I10 Essential (primary) hypertension: Secondary | ICD-10-CM | POA: Diagnosis present

## 2022-06-30 DIAGNOSIS — Z96611 Presence of right artificial shoulder joint: Secondary | ICD-10-CM | POA: Insufficient documentation

## 2022-06-30 DIAGNOSIS — Z79899 Other long term (current) drug therapy: Secondary | ICD-10-CM | POA: Diagnosis not present

## 2022-06-30 LAB — POC OCCULT BLOOD, ED: Fecal Occult Bld: POSITIVE — AB

## 2022-06-30 LAB — CBC
HCT: 32.6 % — ABNORMAL LOW (ref 36.0–46.0)
Hemoglobin: 10.3 g/dL — ABNORMAL LOW (ref 12.0–15.0)
MCH: 26.9 pg (ref 26.0–34.0)
MCHC: 31.6 g/dL (ref 30.0–36.0)
MCV: 85.1 fL (ref 80.0–100.0)
Platelets: 309 10*3/uL (ref 150–400)
RBC: 3.83 MIL/uL — ABNORMAL LOW (ref 3.87–5.11)
RDW: 14.3 % (ref 11.5–15.5)
WBC: 6.4 10*3/uL (ref 4.0–10.5)
nRBC: 0 % (ref 0.0–0.2)

## 2022-06-30 LAB — COMPREHENSIVE METABOLIC PANEL
ALT: 22 U/L (ref 0–44)
AST: 35 U/L (ref 15–41)
Albumin: 3.5 g/dL (ref 3.5–5.0)
Alkaline Phosphatase: 85 U/L (ref 38–126)
Anion gap: 8 (ref 5–15)
BUN: 10 mg/dL (ref 8–23)
CO2: 24 mmol/L (ref 22–32)
Calcium: 8.5 mg/dL — ABNORMAL LOW (ref 8.9–10.3)
Chloride: 100 mmol/L (ref 98–111)
Creatinine, Ser: 0.75 mg/dL (ref 0.44–1.00)
GFR, Estimated: >60 mL/min (ref >60)
Glucose, Bld: 88 mg/dL (ref 70–99)
Potassium: 2.9 mmol/L — ABNORMAL LOW (ref 3.5–5.1)
Sodium: 132 mmol/L — ABNORMAL LOW (ref 135–145)
Total Bilirubin: 0.7 mg/dL (ref 0.3–1.2)
Total Protein: 6.1 g/dL — ABNORMAL LOW (ref 6.5–8.1)

## 2022-06-30 MED ORDER — SODIUM CHLORIDE 0.9 % IV SOLN: INTRAVENOUS | Status: AC

## 2022-06-30 MED ORDER — ACETAMINOPHEN 500 MG PO TABS: 500.0000 mg | ORAL_TABLET | Freq: Four times a day (QID) | ORAL | Status: DC | PRN

## 2022-06-30 MED ORDER — ONDANSETRON HCL 4 MG/2ML IJ SOLN
4.0000 mg | Freq: Four times a day (QID) | INTRAMUSCULAR | Status: DC | PRN
  Administered 2022-06-30 – 2022-07-01 (×2): 4 mg via INTRAVENOUS
  Filled 2022-06-30 (×2): qty 2

## 2022-06-30 MED ORDER — ATORVASTATIN CALCIUM 40 MG PO TABS
80.0000 mg | ORAL_TABLET | Freq: Every day | ORAL | Status: DC
  Administered 2022-07-01: 80 mg via ORAL
  Filled 2022-06-30: qty 2

## 2022-06-30 MED ORDER — PANTOPRAZOLE SODIUM 40 MG IV SOLR
40.0000 mg | Freq: Two times a day (BID) | INTRAVENOUS | Status: DC
  Administered 2022-06-30 – 2022-07-01 (×2): 40 mg via INTRAVENOUS
  Filled 2022-06-30 (×2): qty 10

## 2022-06-30 MED ORDER — LACTATED RINGERS IV BOLUS: 1000.0000 mL | Freq: Once | INTRAVENOUS | Status: DC

## 2022-06-30 MED ORDER — ACETAMINOPHEN 650 MG RE SUPP: 650.0000 mg | Freq: Four times a day (QID) | RECTAL | Status: DC | PRN

## 2022-06-30 MED ORDER — ALBUTEROL SULFATE HFA 108 (90 BASE) MCG/ACT IN AERS
1.0000 | INHALATION_SPRAY | Freq: Once | RESPIRATORY_TRACT | Status: AC
  Administered 2022-06-30: 1 via RESPIRATORY_TRACT
  Filled 2022-06-30: qty 6.7

## 2022-06-30 MED ORDER — LACTATED RINGERS IV BOLUS
500.0000 mL | Freq: Once | INTRAVENOUS | Status: AC
  Administered 2022-06-30: 500 mL via INTRAVENOUS

## 2022-06-30 MED ORDER — DOCUSATE SODIUM 100 MG PO CAPS
100.0000 mg | ORAL_CAPSULE | Freq: Two times a day (BID) | ORAL | Status: DC
  Administered 2022-07-01: 100 mg via ORAL
  Filled 2022-06-30 (×2): qty 1

## 2022-06-30 MED ORDER — PROCHLORPERAZINE EDISYLATE 10 MG/2ML IJ SOLN
10.0000 mg | Freq: Once | INTRAMUSCULAR | Status: AC
  Administered 2022-06-30: 10 mg via INTRAVENOUS
  Filled 2022-06-30: qty 2

## 2022-06-30 MED ORDER — ONDANSETRON HCL 4 MG/2ML IJ SOLN: 4.0000 mg | Freq: Once | INTRAMUSCULAR | Status: DC

## 2022-06-30 MED ORDER — POTASSIUM CHLORIDE CRYS ER 20 MEQ PO TBCR
40.0000 meq | EXTENDED_RELEASE_TABLET | Freq: Once | ORAL | Status: AC
  Administered 2022-06-30: 40 meq via ORAL
  Filled 2022-06-30: qty 2

## 2022-06-30 MED ORDER — LOSARTAN POTASSIUM 50 MG PO TABS
25.0000 mg | ORAL_TABLET | Freq: Every day | ORAL | Status: DC
  Administered 2022-06-30 – 2022-07-01 (×2): 25 mg via ORAL
  Filled 2022-06-30 (×2): qty 1

## 2022-06-30 MED ORDER — METOPROLOL SUCCINATE ER 50 MG PO TB24
50.0000 mg | ORAL_TABLET | Freq: Two times a day (BID) | ORAL | Status: DC
  Administered 2022-06-30 – 2022-07-01 (×2): 50 mg via ORAL
  Filled 2022-06-30 (×2): qty 1

## 2022-06-30 MED ORDER — ACETAMINOPHEN 325 MG PO TABS: 650.0000 mg | ORAL_TABLET | Freq: Four times a day (QID) | ORAL | Status: DC | PRN

## 2022-06-30 MED ORDER — SERTRALINE HCL 50 MG PO TABS
50.0000 mg | ORAL_TABLET | Freq: Every day | ORAL | Status: DC
  Administered 2022-07-01: 50 mg via ORAL
  Filled 2022-06-30: qty 1

## 2022-06-30 MED ORDER — PANTOPRAZOLE SODIUM 40 MG IV SOLR: 40.0000 mg | Freq: Two times a day (BID) | INTRAVENOUS | Status: DC

## 2022-06-30 MED ORDER — DILTIAZEM HCL 25 MG/5ML IV SOLN
10.0000 mg | Freq: Once | INTRAVENOUS | Status: AC
  Administered 2022-06-30: 10 mg via INTRAVENOUS
  Filled 2022-06-30: qty 5

## 2022-06-30 MED ORDER — POTASSIUM CHLORIDE 20 MEQ PO PACK
40.0000 meq | PACK | Freq: Once | ORAL | Status: AC
  Administered 2022-06-30: 40 meq via ORAL
  Filled 2022-06-30: qty 2

## 2022-06-30 MED ORDER — ONDANSETRON HCL 4 MG PO TABS: 4.0000 mg | ORAL_TABLET | Freq: Four times a day (QID) | ORAL | Status: DC | PRN

## 2022-06-30 MED ORDER — ONDANSETRON HCL 4 MG/2ML IJ SOLN
4.0000 mg | Freq: Once | INTRAMUSCULAR | Status: AC
  Administered 2022-06-30: 4 mg via INTRAVENOUS
  Filled 2022-06-30: qty 2

## 2022-06-30 MED ORDER — SENNOSIDES-DOCUSATE SODIUM 8.6-50 MG PO TABS: 1.0000 | ORAL_TABLET | Freq: Every evening | ORAL | Status: DC | PRN

## 2022-06-30 NOTE — ED Notes (Signed)
Meal tray provided to pt.

## 2022-06-30 NOTE — ED Provider Notes (Signed)
[place] AT [facility] Provider Note   CSN: [id number] Arrival date & time: 06/30/22  1202     History {Add pertinent medical, surgical, social history, OB history to HPI:1} Chief Complaint  Patient presents with   Migraine   Nausea    Marisa Nunez is a 77 y.o. female.   Migraine     Patient has history of dementia, COPD, stroke, breast cancer, paroxysmal atrial fibrillation, depression and hypertension and CHF.  Patient presents ED with complaints of nausea.  Patient states she has had the symptoms for the last couple days.  She is not sure what is causing it.  She denies having abdominal pain.  She denies headache.  She denies problems with her balance or coordination  Home Medications Prior to Admission medications   Medication Sig Start Date End Date Taking? Authorizing Provider  acetaminophen (TYLENOL) 500 MG tablet Take 1 tablet (500 mg total) by mouth every 6 (six) hours as needed for moderate pain, fever or mild pain. 10/24/21   Pokhrel, Corrie Mckusick, MD  albuterol (VENTOLIN HFA) 108 (90 Base) MCG/ACT inhaler Inhale 2 puffs into the lungs every 4 (four) hours as needed for wheezing or shortness of breath.    [provider]  apixaban (ELIQUIS) 5 MG TABS tablet TAKE 1 TABLET(5 MG) BY MOUTH TWICE DAILY Patient taking differently: Take 5 mg by mouth 2 (two) times daily. 02/28/22   Sherran Needs, NP  atorvastatin (LIPITOR) 80 MG tablet TAKE 1 TABLET(80 MG) BY MOUTH DAILY Patient not taking: Reported on 06/15/2022 06/25/21   Timothy Lasso, MD  buPROPion [facility]) 75 MG tablet Take 75 mg by mouth 2 (two) times daily.    [provider]  furosemide (LASIX) 20 MG tablet Take 2 tablets (40 mg total) by mouth daily. 02/11/22   Charise Killian, MD  losartan (COZAAR) 50 MG tablet Take 0.5 tablets (25 mg total) by mouth daily. 11/28/21   Mapp, Claudia Desanctis, MD  memantine (NAMENDA) 10 MG tablet Take 10 mg by mouth 2 (two) times daily.     [provider]  metoprolol succinate (TOPROL-XL) 50 MG 24 hr tablet Take 1 tablet (50 mg total) by mouth 2 (two) times daily. Patient taking differently: Take by mouth 2 (two) times daily. 50 mg q am and 25 mg at night 02/27/22   Sherran Needs, NP  Multiple Vitamins-Minerals (MULTIVITAMIN WITH MINERALS) tablet Take 1 tablet by mouth daily.    [provider]  potassium chloride (KLOR-CON M) 10 MEQ tablet TAKE 1 TABLET(10 MEQ) BY MOUTH TWICE DAILY Patient taking differently: Take 10 mEq by mouth 2 (two) times daily. 02/11/22   Charise Killian, MD  risperiDONE (RISPERDAL) 0.5 MG tablet Take 0.5 mg by mouth daily.    [provider]  sertraline (ZOLOFT) 50 MG tablet TAKE 1 TABLET(50 MG) BY MOUTH DAILY Patient taking differently: Take 50 mg by mouth daily. TAKE 1 TABLET(50 MG) BY MOUTH DAILY 05/08/22   [name], MD  spironolactone (ALDACTONE) 25 MG tablet Take 0.5 tablets (12.5 mg total) by mouth daily. 12/10/21 11/30/23  Sande Rives E, PA-C  umeclidinium bromide (INCRUSE ELLIPTA) 62.5 MCG/ACT AEPB Inhale 1 puff into the lungs daily. 04/08/22   Leigh Aurora, DO      Allergies    Influenza virus vaccine, Myrbetriq [mirabegron er], Pneumococcal vaccine, and Ativan [lorazepam]    Review of Systems   Review of Systems  Physical Exam Updated Vital Signs BP (!) 152/81   Pulse 95  Temp (!) 97.5 F (36.4 C) (Oral)   Resp 18   Ht 1.626 m ('5\' 4"'$ )   Wt 52 kg   LMP  (LMP Unknown)   SpO2 100%   BMI 19.68 kg/m  Physical Exam Vitals and nursing note reviewed.  Constitutional:      Appearance: She is well-developed. She is not diaphoretic.  HENT:     Head: Normocephalic and atraumatic.     Right Ear: External ear normal.     Left Ear: External ear normal.  Eyes:     General: No scleral icterus.       Right eye: No discharge.        Left eye: No discharge.     Conjunctiva/sclera: Conjunctivae normal.  Neck:     Trachea: No tracheal deviation.  Cardiovascular:      Rate and Rhythm: Normal rate and regular rhythm.  Pulmonary:     Effort: Pulmonary effort is normal. No respiratory distress.     Breath sounds: Normal breath sounds. No stridor. No wheezing or rales.  Abdominal:     General: Bowel sounds are normal. There is no distension.     Palpations: Abdomen is soft.     Tenderness: There is no abdominal tenderness. There is no guarding or rebound.  Musculoskeletal:        General: No tenderness or deformity.     Cervical back: Neck supple.  Skin:    General: Skin is warm and dry.     Findings: No rash.  Neurological:     General: No focal deficit present.     Mental Status: She is alert.     Cranial Nerves: No cranial nerve deficit, dysarthria or facial asymmetry.     Sensory: No sensory deficit.     Motor: No abnormal muscle tone or seizure activity.     Coordination: Coordination normal.  Psychiatric:        Mood and Affect: Mood normal.     ED Results / Procedures / Treatments   Labs (all labs ordered are listed, but only abnormal results are displayed) Labs Reviewed  CBC - Abnormal; Notable for the following components:      Result Value   RBC 3.83 (*)    Hemoglobin 10.3 (*)    HCT 32.6 (*)    All other components within normal limits  COMPREHENSIVE METABOLIC PANEL - Abnormal; Notable for the following components:   Sodium 132 (*)    Potassium 2.9 (*)    Calcium 8.5 (*)    Total Protein 6.1 (*)    All other components within normal limits  POC OCCULT BLOOD, ED - Abnormal; Notable for the following components:   Fecal Occult Bld POSITIVE (*)    All other components within normal limits    EKG EKG Interpretation  Date/Time:  Monday June 30 2022 13:02:09 EST Ventricular Rate:  86 PR Interval:    QRS Duration: 103 QT Interval:  361 QTC Calculation: 432 R Axis:   22 Text Interpretation: Atrial fibrillation No significant change since last tracing Confirmed by Dorie Rank [phone]) on 06/30/2022 1:04:50  PM  Radiology No results found.  Procedures Procedures  {Document cardiac monitor, telemetry assessment procedure when appropriate:1}  Medications Ordered in ED Medications  lactated ringers bolus 500 mL (0 mLs Intravenous Stopped 06/30/22 1427)  prochlorperazine (COMPAZINE) injection 10 mg (10 mg Intravenous Given 06/30/22 1254)  potassium chloride SA (KLOR-CON M) CR tablet 40 mEq (40 mEq Oral Given 06/30/22 1401)  ondansetron (ZOFRAN)  injection 4 mg (4 mg Intravenous Given 06/30/22 1407)  diltiazem (CARDIZEM) injection 10 mg (10 mg Intravenous Given 06/30/22 1544)    ED Course/ Medical Decision Making/ A&P Clinical Course as of 06/30/22 1549  Mon Jun 30, 2022  1352 CBC(!) CBC shows anemia, decreased compared to previous [JK]  1352 Comprehensive metabolic panel(!) Potassium decreased at 2.9 [JK]  1401 Patient still complaining of some nausea.  No vomiting.  Will try p.o. challenge and give her additional Zofran.  Rectal exam performed.  No gross blood, brown stool [JK]    Clinical Course User Index [JK] Dorie Rank, MD   {   Click here for ABCD2, HEART and other calculatorsREFRESH Note before signing :1}                          Medical Decision Making Problems Addressed: Anemia, unspecified type: chronic illness or injury with exacerbation, progression, or side effects of treatment Atrial fibrillation, unspecified type [facility]): chronic illness or injury with exacerbation, progression, or side effects of treatment Nausea: acute illness or injury Occult blood positive stool: undiagnosed new problem with uncertain prognosis  Amount and/or Complexity of Data Reviewed Labs: ordered. Decision-making details documented in ED Course.  Risk Prescription drug management. Decision regarding hospitalization.   Patient presented to the ED with complaints of nausea.  She denied headache abdominal pain or other complaints.  Patient's laboratory tests were notable for hemoglobin of 10.3. this is  down from 11.6 just 2 weeks ago.  Is on Eliquis.  Concerned about the possibility of occult bleeding causing her nausea although she does not have an elevated BUN.  Fecal occult is positive.  Patient also noted to be in her chronic A-fib but at times is having more tachycardia in the 120s 130s.  IV Cardizem administered.  I discussed the case with her daughter.  With her drop in hemoglobin and fecal occult positive stools while on Eliquis I think she would benefit from GI consultation admission for further workup.  Case discussed with Dr Marland Kitchen regarding admission.  Dr Matilde Sprang will follow up with GI  {Document critical care time when appropriate:1} {Document review of labs and clinical decision tools ie heart score, Chads2Vasc2 etc:1}  {Document your independent review of radiology images, and any outside records:1} {Document your discussion with family members, caretakers, and with consultants:1} {Document social determinants of health affecting pt's care:1} {Document your decision making why or why not admission, treatments were needed:1} Final Clinical Impression(s) / ED Diagnoses Final diagnoses:  Atrial fibrillation, unspecified type ([place])  Nausea  Anemia, unspecified type  Occult blood positive stool    Rx / DC Orders ED Discharge Orders     None

## 2022-06-30 NOTE — ED Notes (Signed)
EKG given to Dr. Knapp. 

## 2022-06-30 NOTE — H&P (Signed)
History and Physical    Marisa Nunez [id number] DOB: 13-Feb-1946 DOA: 06/30/2022  PCP: [name], MD   Patient coming from: Home  I have personally briefly reviewed patient's old medical records in [place]  Chief Complaint: Generalized Weakness,  Nausea  HPI: Marisa Nunez is a 77 y.o. female with PMH significant for dementia, COPD, history of CVA, history of breast cancer, paroxysmal A-fib on Eliquis, depression, essential hypertension, CHF presented to the ED with complaints of nausea and vomiting.  Patient reports having these symptoms for last couple of days, she denies any abdominal pain, denies any headache, dizziness, palpitations.  She denies any fever, sick contacts, recent travel.  She denies any urinary tract symptoms, blood in the urine or stools.  Daughter reports she has been having low blood pressure for last few days.  ED Course: She was tachycardic, hypertensive, other vitals were stable. HR 115, BP 152/81, SpO2 100% on room air, temp 97.5, RR 16. Labs include sodium 132, potassium 2.9, chloride 100, bicarb 24, BUN 10, creatinine 0.75, glucose 88, calcium 8.5, anion gap 8, alkaline phosphatase 85, albumin 3.5, AST 35, ALT 22, total protein 6.1, total bilirubin 0.7, WBC 6.4, hemoglobin 10.3, hematocrit 32.6, MCV 85.1, platelet 309, hold for occult blood positive. EKG atrial fibrillation with RVR  Review of Systems: Review of Systems  Constitutional: Negative.   HENT: Negative.    Eyes: Negative.   Respiratory: Negative.    Cardiovascular: Negative.   Gastrointestinal:  Positive for blood in stool, nausea and vomiting.  Genitourinary: Negative.   Musculoskeletal: Negative.   Skin: Negative.   Neurological: Negative.   Endo/Heme/Allergies: Negative.   Psychiatric/Behavioral:  Positive for depression. The patient is nervous/anxious.      Past Medical History:  Diagnosis Date   Anxiety    Breast cancer ([place])    Chronic combined systolic and  diastolic CHF (congestive heart failure) ([place])    a. Echo 11/2021: LVEF of 40-45% with global hypokinesis, mildly reduced RV systolic function, and moderate MR   COPD (chronic obstructive pulmonary disease) ([facility])    Dementia ([facility])    Depression    HTN (hypertension)    Paroxysmal atrial fibrillation ([place]) 2017   Stroke [facility])     Past Surgical History:  Procedure Laterality Date   ABDOMINAL HYSTERECTOMY     CARDIOVERSION N/A 02/19/2022   Procedure: CARDIOVERSION;  Surgeon: Freada Bergeron, MD;  Location: [place];  Service: Cardiovascular;  Laterality: N/A;   REVERSE SHOULDER ARTHROPLASTY Right 10/21/2021   Procedure: RIGHT REVERSE TOTAL SHOULDER ARTHROPLASTY;  Surgeon: Hiram Gash, MD;  Location: [place];  Service: Orthopedics;  Laterality: Right;  allen bed, spider, tornier (rep aware), open shoulder tray.     reports that she has quit smoking. Her smoking use included cigarettes. She smoked an average of .5 packs per day. She does not have any smokeless tobacco history on file. She reports that she does not currently use alcohol after a past usage of about 6.0 standard drinks of alcohol per week. She reports that she does not currently use drugs.  Allergies  Allergen Reactions   Influenza Virus Vaccine Hives   Myrbetriq [Mirabegron Er]     Confusion, agitation, hallucination    Pneumococcal Vaccine Hives   Ativan [Lorazepam]     Makes pt agitated per daughter     Family History  Problem Relation Age of Onset   Arrhythmia Father    Family history reviewed and not pertinent.  Prior to  Admission medications   Medication Sig Start Date End Date Taking? Authorizing Provider  acetaminophen (TYLENOL) 500 MG tablet Take 1 tablet (500 mg total) by mouth every 6 (six) hours as needed for moderate pain, fever or mild pain. 10/24/21   Pokhrel, Corrie Mckusick, MD  albuterol (VENTOLIN HFA) 108 (90 Base) MCG/ACT inhaler Inhale 2 puffs into the lungs every 4 (four) hours as needed for  wheezing or shortness of breath.    [provider]  apixaban (ELIQUIS) 5 MG TABS tablet TAKE 1 TABLET(5 MG) BY MOUTH TWICE DAILY Patient taking differently: Take 5 mg by mouth 2 (two) times daily. 02/28/22   Sherran Needs, NP  atorvastatin (LIPITOR) 80 MG tablet TAKE 1 TABLET(80 MG) BY MOUTH DAILY Patient not taking: Reported on 06/15/2022 06/25/21   Timothy Lasso, MD  buPROPion [facility]) 75 MG tablet Take 75 mg by mouth 2 (two) times daily.    [provider]  furosemide (LASIX) 20 MG tablet Take 2 tablets (40 mg total) by mouth daily. 02/11/22   Charise Killian, MD  losartan (COZAAR) 50 MG tablet Take 0.5 tablets (25 mg total) by mouth daily. 11/28/21   Mapp, Claudia Desanctis, MD  memantine (NAMENDA) 10 MG tablet Take 10 mg by mouth 2 (two) times daily.    [provider]  metoprolol succinate (TOPROL-XL) 50 MG 24 hr tablet Take 1 tablet (50 mg total) by mouth 2 (two) times daily. Patient taking differently: Take by mouth 2 (two) times daily. 50 mg q am and 25 mg at night 02/27/22   Sherran Needs, NP  Multiple Vitamins-Minerals (MULTIVITAMIN WITH MINERALS) tablet Take 1 tablet by mouth daily.    [provider]  potassium chloride (KLOR-CON M) 10 MEQ tablet TAKE 1 TABLET(10 MEQ) BY MOUTH TWICE DAILY Patient taking differently: Take 10 mEq by mouth 2 (two) times daily. 02/11/22   Charise Killian, MD  risperiDONE (RISPERDAL) 0.5 MG tablet Take 0.5 mg by mouth daily.    [provider]  sertraline (ZOLOFT) 50 MG tablet TAKE 1 TABLET(50 MG) BY MOUTH DAILY Patient taking differently: Take 50 mg by mouth daily. TAKE 1 TABLET(50 MG) BY MOUTH DAILY 05/08/22   [name], MD  spironolactone (ALDACTONE) 25 MG tablet Take 0.5 tablets (12.5 mg total) by mouth daily. 12/10/21 11/30/23  Sande Rives E, PA-C  umeclidinium bromide (INCRUSE ELLIPTA) 62.5 MCG/ACT AEPB Inhale 1 puff into the lungs daily. 04/08/22   Leigh Aurora, DO    Physical Exam: Vitals:   06/30/22 1218  06/30/22 1219 06/30/22 1220 06/30/22 1604  BP:      Pulse:  95    Resp:   18   Temp: (!) 97.5 F (36.4 C)   97.6 F (36.4 C)  TempSrc: Oral     SpO2:  100%    Weight: 52 kg     Height: '5\' 4"'$  (1.626 m)       Constitutional: Appears comfortable, not in any acute distress, deconditioned. Vitals:   06/30/22 1218 06/30/22 1219 06/30/22 1220 06/30/22 1604  BP:      Pulse:  95    Resp:   18   Temp: (!) 97.5 F (36.4 C)   97.6 F (36.4 C)  TempSrc: Oral     SpO2:  100%    Weight: 52 kg     Height: '5\' 4"'$  (1.626 m)      Eyes: PERRL, lids and conjunctivae normal ENMT: Mucous membranes are moist.  Posterior pharynx without exudate. Neck: normal, supple, no masses,  no thyromegaly Respiratory: CTA bilaterally, respiratory effort normal, no accessory muscle use, RR 15 Cardiovascular: S1-S2 heard, irregular rhythm, no murmur. Abdomen: Abdomen is Soft, non tender, non distended, BS+ Musculoskeletal:  Good ROM, no contractures. Normal muscle tone.  Skin: no rashes, lesions, ulcers. No induration Neurologic: CN 2-12 grossly intact. Sensation intact, DTR normal. Strength 5/5 in all 4.  Psychiatric: Normal judgment and insight. Alert and oriented x 1. Normal mood.     Labs on Admission: I have personally reviewed following labs and imaging studies  CBC: Recent Labs  Lab 06/30/22 1259  WBC 6.4  HGB 10.3*  HCT 32.6*  MCV 85.1  PLT [id number]   Basic Metabolic Panel: Recent Labs  Lab 06/26/22 1456 06/30/22 1259  NA 131* 132*  K 3.1* 2.9*  CL 91* 100  CO2 22 24  GLUCOSE 84 88  BUN 13 10  CREATININE 0.72 0.75  CALCIUM 9.0 8.5*   GFR: Estimated Creatinine Clearance: 50.6 mL/min (by C-G formula based on SCr of 0.75 mg/dL). Liver Function Tests: Recent Labs  Lab 06/30/22 1259  AST 35  ALT 22  ALKPHOS 85  BILITOT 0.7  PROT 6.1*  ALBUMIN 3.5   No results for input(s): "LIPASE", "AMYLASE" in the last 168 hours. No results for input(s): "AMMONIA" in the last 168  hours. Coagulation Profile: No results for input(s): "INR", "PROTIME" in the last 168 hours. Cardiac Enzymes: No results for input(s): "CKTOTAL", "CKMB", "CKMBINDEX", "TROPONINI" in the last 168 hours. BNP (last 3 results) No results for input(s): "PROBNP" in the last 8760 hours. HbA1C: No results for input(s): "HGBA1C" in the last 72 hours. CBG: No results for input(s): "GLUCAP" in the last 168 hours. Lipid Profile: No results for input(s): "CHOL", "HDL", "LDLCALC", "TRIG", "CHOLHDL", "LDLDIRECT" in the last 72 hours. Thyroid Function Tests: No results for input(s): "TSH", "T4TOTAL", "FREET4", "T3FREE", "THYROIDAB" in the last 72 hours. Anemia Panel: No results for input(s): "VITAMINB12", "FOLATE", "FERRITIN", "TIBC", "IRON", "RETICCTPCT" in the last 72 hours. Urine analysis:    Component Value Date/Time   COLORURINE STRAW (A) 06/14/2022 2112   APPEARANCEUR CLEAR 06/14/2022 2112   APPEARANCEUR Clear 05/22/2021 1055   LABSPEC 1.010 06/14/2022 2112   PHURINE 7.0 06/14/2022 2112   GLUCOSEU NEGATIVE 06/14/2022 2112   HGBUR NEGATIVE 06/14/2022 2112   BILIRUBINUR negative 06/22/2022 1141   BILIRUBINUR Negative 05/22/2021 1055   KETONESUR negative 06/22/2022 [place] 06/14/2022 2112   PROTEINUR negative 06/22/2022 1141   PROTEINUR NEGATIVE 06/14/2022 2112   UROBILINOGEN 0.2 06/22/2022 1141   NITRITE Negative 06/22/2022 1141   NITRITE NEGATIVE 06/14/2022 2112   LEUKOCYTESUR Trace (A) 06/22/2022 1141   LEUKOCYTESUR NEGATIVE 06/14/2022 2112    Radiological Exams on Admission: No results found.  EKG: Independently reviewed.  Atrial fibrillation  Assessment/Plan Principal Problem:   Occult GI bleeding Active Problems:   Essential hypertension   History of right breast cancer   History of tobacco use   Mixed hyperlipidemia   PAF (paroxysmal atrial fibrillation) (HCC)   Dementia (HCC)  Occult GI bleed: Patient presented with generalized weakness and  nausea. Hemoglobin 10.3 low from 11.6 two days ago in setting of eliquis use. Patient denies any visible GI bleeding. Stool for occult blood positive. Admitted overnight for observation. Monitor H&H every 12 hours. Start pantoprazole 40 mg IV every 12 hours. Continue IV fluid resuscitation. GI is consulted, awaiting recommendation. Patient is due for colonoscopy, may need during hospitalization.  Dehydration: Continue IV fluid resuscitation.  Atrial fibrillation with  RVR: Patient was tachycardic on arrival, She was given Cardizem once. Heart rate is now controlled. Continue metoprolol. Hold Eliquis.  Continue SCDs  Essential Hypertension: Continue losartan, metoprolol.  Dementia: Stable.  Denies behavioral problems Resume Namenda and other medications when medication reconciliation complete.  Tobacco use: Counseled on quit smoking.  History of breast cancer: Stable.  Follow-up outpatient oncology.  Major depression: Continue Zoloft.  HyperLipidemia: Continue Lipitor   DVT prophylaxis: SCDs Code Status: DNR Family Communication: Spoke with daughter on phone Disposition Plan:   ,Status is: Observation The patient remains OBS appropriate and will d/c before 2 midnights.   Admitted For occult GI bleed , may require GI evaluation.  Consults called: Gastroenterology Admission status: Observation.   Duard Brady MD [facility]   If 7PM-7AM, please contact night-coverage [email or web address]   06/30/2022, 5:08 PM

## 2022-06-30 NOTE — Progress Notes (Signed)
Internal Medicine Clinic Attending  Case discussed with the resident at the time of the visit.  We reviewed the resident's history and exam and pertinent patient test results.  I agree with the assessment, diagnosis, and plan of care documented in the resident's note.  

## 2022-06-30 NOTE — ED Notes (Signed)
Pt ambulated to bathroom for BM

## 2022-06-30 NOTE — ED Triage Notes (Signed)
Pt bib EMS from home for migraine with nausea x 3 days. Pt hx of dementia a&o x 4 during triage.  EMS:  IV 18g lac  Bp160/90 Cbg 128 Hr 80 Sp02 99%

## 2022-07-01 DIAGNOSIS — R195 Other fecal abnormalities: Secondary | ICD-10-CM | POA: Diagnosis not present

## 2022-07-01 LAB — COMPREHENSIVE METABOLIC PANEL
ALT: 24 U/L (ref 0–44)
AST: 32 U/L (ref 15–41)
Albumin: 3.5 g/dL (ref 3.5–5.0)
Alkaline Phosphatase: 91 U/L (ref 38–126)
Anion gap: 6 (ref 5–15)
BUN: 10 mg/dL (ref 8–23)
CO2: 23 mmol/L (ref 22–32)
Calcium: 8.9 mg/dL (ref 8.9–10.3)
Chloride: 104 mmol/L (ref 98–111)
Creatinine, Ser: 0.68 mg/dL (ref 0.44–1.00)
GFR, Estimated: >60 mL/min (ref >60)
Glucose, Bld: 111 mg/dL — ABNORMAL HIGH (ref 70–99)
Potassium: 3.8 mmol/L (ref 3.5–5.1)
Sodium: 133 mmol/L — ABNORMAL LOW (ref 135–145)
Total Bilirubin: 0.5 mg/dL (ref 0.3–1.2)
Total Protein: 6.4 g/dL — ABNORMAL LOW (ref 6.5–8.1)

## 2022-07-01 LAB — CBC
HCT: 33 % — ABNORMAL LOW (ref 36.0–46.0)
Hemoglobin: 10.5 g/dL — ABNORMAL LOW (ref 12.0–15.0)
MCH: 27.3 pg (ref 26.0–34.0)
MCHC: 31.8 g/dL (ref 30.0–36.0)
MCV: 85.7 fL (ref 80.0–100.0)
Platelets: 298 10*3/uL (ref 150–400)
RBC: 3.85 MIL/uL — ABNORMAL LOW (ref 3.87–5.11)
RDW: 14.4 % (ref 11.5–15.5)
WBC: 8.4 10*3/uL (ref 4.0–10.5)
nRBC: 0 % (ref 0.0–0.2)

## 2022-07-01 LAB — MAGNESIUM: Magnesium: 2 mg/dL (ref 1.7–2.4)

## 2022-07-01 LAB — PHOSPHORUS: Phosphorus: 2.6 mg/dL (ref 2.5–4.6)

## 2022-07-01 MED ORDER — MEMANTINE HCL 10 MG PO TABS: 10.0000 mg | ORAL_TABLET | Freq: Two times a day (BID) | ORAL | Status: DC

## 2022-07-01 MED ORDER — QUETIAPINE FUMARATE 25 MG PO TABS: 25.0000 mg | ORAL_TABLET | Freq: Every day | ORAL | Status: DC

## 2022-07-01 MED ORDER — MEMANTINE HCL 10 MG PO TABS
10.0000 mg | ORAL_TABLET | Freq: Two times a day (BID) | ORAL | Status: DC
  Administered 2022-07-01: 10 mg via ORAL
  Filled 2022-07-01: qty 1

## 2022-07-01 MED ORDER — QUETIAPINE FUMARATE 25 MG PO TABS
25.0000 mg | ORAL_TABLET | Freq: Once | ORAL | Status: AC
  Administered 2022-07-01: 25 mg via ORAL
  Filled 2022-07-01: qty 1

## 2022-07-01 MED ORDER — BUPROPION HCL 75 MG PO TABS
75.0000 mg | ORAL_TABLET | Freq: Two times a day (BID) | ORAL | Status: DC
  Filled 2022-07-01: qty 1

## 2022-07-01 NOTE — Discharge Summary (Signed)
Physician Discharge Summary  Marisa Nunez [id number] DOB: 12-02-1945 DOA: 06/30/2022  PCP: [name], MD  Admit date: 06/30/2022  Discharge date: 07/01/2022  Admitted From: Home.  Disposition:  Home.  Recommendations for Outpatient Follow-up:  Follow up with PCP in 1-2 weeks Please obtain BMP/CBC in one week Advised to follow up PCP in one week. Advised to follow up Dr. [name], [name] in 2 weeks for outpatient colonoscopy  Home Health: None Equipment/Devices:None  Discharge Condition: Stable CODE STATUS:DNR Diet recommendation: Heart Healthy   Brief [facility] Course: This 77 y.o. female with PMH significant for dementia, COPD, history of CVA, history of breast cancer, paroxysmal A-fib on Eliquis, depression, essential hypertension, CHF presented to the ED with complaints of nausea and vomiting.  Patient reports having these symptoms for last couple of days, she denies any abdominal pain, denies any headache, dizziness, palpitations.  She denies any fever, sick contacts, recent travel.  She denies any urinary tract symptoms, blood in the urine or stools.  Daughter reports she has been having low blood pressure for last few days.  ED workup showed hemoglobin 10.3,  stool for occult blood+.  EKG showed atrial fibrillation with RVR. She was admitted for occult GI bleed.  GI was consulted. H/ H remains stable. There was no obvious visible bleeding, Pros and cons of colonoscopy discussed with daughter and GI recommended outpatient colonoscopy given dementia, Patient is being discharged home.   Discharge Diagnoses:  Principal Problem:   Occult GI bleeding Active Problems:   Essential hypertension   History of right breast cancer   History of tobacco use   Mixed hyperlipidemia   PAF (paroxysmal atrial fibrillation) (HCC)   Dementia (HCC)  Occult GI bleed: Patient presented with generalized weakness and nausea. Hemoglobin 10.3 low from 11.6 two days ago in setting of  eliquis use. Patient denies any visible GI bleeding. Stool for occult blood positive. Monitor H&H every 12 hours. Continue pantoprazole 40 mg IV every 12 hours. Continue IV fluid resuscitation. GI Dr. [name]  is consulted,  recommended outpatient colonoscopy. Patient and family agreeable and wants to be discharged.   Dehydration: Continue IV fluid resuscitation.   Atrial fibrillation with RVR: Patient was tachycardic on arrival, She was given Cardizem once. Heart rate is now controlled. Continue metoprolol. Eliquis resumed.   Essential Hypertension: Continue losartan, metoprolol.   Dementia: Stable.  Denies behavioral problems Continue Namenda, Seroquel.   Tobacco use: Counseled on quit smoking.   History of breast cancer: Stable.  Follow-up outpatient oncology.   Major depression: Continue Zoloft.   HyperLipidemia: Continue Lipitor  Discharge Instructions  Discharge Instructions     Call MD for:  difficulty breathing, headache or visual disturbances   Complete by: As directed    Call MD for:  persistant dizziness or light-headedness   Complete by: As directed    Call MD for:  persistant nausea and vomiting   Complete by: As directed    Diet - low sodium heart healthy   Complete by: As directed    Diet Carb Modified   Complete by: As directed    Discharge instructions   Complete by: As directed    Advised to follow up PCP in one week. Advised to follow up Dr. [name], [name] in 2 weeks for outpatient colonoscopy   Increase activity slowly   Complete by: As directed       Allergies as of 07/01/2022       Reactions   Influenza Virus Vaccine Hives  Myrbetriq [mirabegron Er]    Confusion, agitation, hallucination    Pneumococcal Vaccine Hives   Ativan [lorazepam]    Makes pt agitated per daughter         Medication List     STOP taking these medications    buPROPion 75 MG tablet Commonly known as: WELLBUTRIN   Incruse Ellipta 62.5 MCG/ACT  Aepb Generic drug: umeclidinium bromide   risperiDONE 0.5 MG tablet Commonly known as: RISPERDAL       TAKE these medications    acetaminophen 500 MG tablet Commonly known as: TYLENOL Take 1 tablet (500 mg total) by mouth every 6 (six) hours as needed for moderate pain, fever or mild pain.   albuterol 108 (90 Base) MCG/ACT inhaler Commonly known as: VENTOLIN HFA Inhale 2 puffs into the lungs every 4 (four) hours as needed for wheezing or shortness of breath.   apixaban 5 MG Tabs tablet Commonly known as: Eliquis TAKE 1 TABLET(5 MG) BY MOUTH TWICE DAILY What changed:  how much to take how to take this when to take this additional instructions   furosemide 20 MG tablet Commonly known as: LASIX Take 2 tablets (40 mg total) by mouth daily.   losartan 50 MG tablet Commonly known as: COZAAR Take 0.5 tablets (25 mg total) by mouth daily.   memantine 10 MG tablet Commonly known as: NAMENDA Take 10 mg by mouth 2 (two) times daily.   metoprolol succinate 50 MG 24 hr tablet Commonly known as: TOPROL-XL Take 1 tablet (50 mg total) by mouth 2 (two) times daily.   multivitamin with minerals tablet Take 1 tablet by mouth daily.   ondansetron 4 MG disintegrating tablet Commonly known as: ZOFRAN-ODT Take 4 mg by mouth every 8 (eight) hours as needed for vomiting or nausea.   potassium chloride 10 MEQ tablet Commonly known as: KLOR-CON M TAKE 1 TABLET(10 MEQ) BY MOUTH TWICE DAILY What changed: See the new instructions.   QUEtiapine 25 MG tablet Commonly known as: SEROQUEL Take 25 mg by mouth at bedtime.   sertraline 50 MG tablet Commonly known as: ZOLOFT TAKE 1 TABLET(50 MG) BY MOUTH DAILY What changed:  how much to take how to take this when to take this   spironolactone 25 MG tablet Commonly known as: ALDACTONE Take 0.5 tablets (12.5 mg total) by mouth daily.        Follow-up Information     [name], MD Follow up in 1 week(s).   Specialty: Internal  Medicine Contact information: [address] [place] [address] [phone]         Carol Ada, MD Follow up in 2 week(s).   Specialty: Gastroenterology Contact information: [address] [place] [address] [phone]                Allergies  Allergen Reactions   Influenza Virus Vaccine Hives   Myrbetriq Andree [place] Er]     Confusion, agitation, hallucination    Pneumococcal Vaccine Hives   Ativan [Lorazepam]     Makes pt agitated per daughter     Consultations: Gastroenterology   Procedures/Studies: CT ABDOMEN PELVIS W CONTRAST  Result Date: 06/14/2022 CLINICAL DATA:  Abdominal pain, acute, nonlocalized. Diarrhea, unable to urinate. EXAM: CT ABDOMEN AND PELVIS WITH CONTRAST TECHNIQUE: Multidetector CT imaging of the abdomen and pelvis was performed using the standard protocol following bolus administration of intravenous contrast. RADIATION DOSE REDUCTION: This exam was performed according to the departmental dose-optimization program which includes automated exposure control,  adjustment of the mA and/or kV according to patient size and/or use of iterative reconstruction technique. CONTRAST:  138m OMNIPAQUE IOHEXOL 300 MG/ML  SOLN COMPARISON:  None Available. FINDINGS: Lower chest: Bronchial wall thickening. Bilateral trace pleural effusions, right greater than left. Coronary artery calcification. Hepatobiliary: No focal liver abnormality. No gallstones, gallbladder wall thickening, or pericholecystic fluid. No biliary dilatation. Pancreas: No focal lesion. Normal pancreatic contour. No surrounding inflammatory changes. No main pancreatic ductal dilatation. Spleen: Normal in size without focal abnormality. Adrenals/Urinary Tract: No adrenal nodule bilaterally. Bilateral kidneys enhance symmetrically. Subcentimeter hypodensities are too small to characterize-no further follow-up indicated. No hydronephrosis. No hydroureter. The urinary  bladder is unremarkable. Stomach/Bowel: Stomach is within normal limits. No evidence of bowel wall thickening or dilatation. Fluid within the large bowel lumen. The appendix is not definitely identified with no inflammatory changes in the right lower quadrant to suggest acute appendicitis. Vascular/Lymphatic: No abdominal aorta or iliac aneurysm. Severe atherosclerotic plaque of the aorta and its branches. No abdominal, pelvic, or inguinal lymphadenopathy. Reproductive: Status post hysterectomy. No adnexal masses. Other: No intraperitoneal free fluid. No intraperitoneal free gas. No organized fluid collection. Musculoskeletal: No abdominal wall hernia or abnormality. No suspicious lytic or blastic osseous lesions. No acute displaced fracture. Dextroscoliosis centered at the L3-L4 level. Multilevel severe degenerative changes of the spine. Grade 1 anterolisthesis of L3 on L4, L4 on L5. Severe degenerative changes of the hip, left greater than right. IMPRESSION: 1. Bilateral trace pleural effusions, right greater than left. 2. Diarrheal state with no findings of colitis or bowel perforation. 3. Otherwise no acute intra-abdominal or intrapelvic abnormality. 4.  Aortic Atherosclerosis (ICD10-I70.0). Electronically Signed   By: MIven FinnM.D.   On: 06/14/2022 23:01   CT Head Wo Contrast  Result Date: 06/14/2022 CLINICAL DATA:  Provided history: Mental status change, unknown cause. Altered mental status. EXAM: CT HEAD WITHOUT CONTRAST TECHNIQUE: Contiguous axial images were obtained from the base of the skull through the vertex without intravenous contrast. RADIATION DOSE REDUCTION: This exam was performed according to the departmental dose-optimization program which includes automated exposure control, adjustment of the mA and/or kV according to patient size and/or use of iterative reconstruction technique. COMPARISON:  Head CT 10/17/2021. FINDINGS: Brain: Mild generalized parenchymal atrophy. Moderate patchy  and ill-defined hypoattenuation within cerebral white matter, nonspecific but compatible with chronic small vessel disease. There is no acute intracranial hemorrhage. No demarcated cortical infarct. No extra-axial fluid collection. No evidence of an intracranial mass. No midline shift. Skull: No hyperdense vessel.  Atherosclerotic calcifications. Sinuses/Orbits: No mass or acute finding within the imaged orbits. Mild mucosal thickening within the right maxillary sinus at the imaged levels. IMPRESSION: 1. Mildly motion degraded exam. 2. No evidence of an acute intracranial abnormality. 3. Moderate chronic small vessel ischemic changes within cerebral white matter. 4. Mild generalized parenchymal atrophy. 5. Mild mucosal thickening within the right maxillary sinus at the imaged levels. Electronically Signed   By: KKellie SimmeringD.O.   On: 06/14/2022 14:30   DG Chest Port 1 View  Result Date: 06/14/2022 CLINICAL DATA:  sob EXAM: PORTABLE CHEST - 1 VIEW COMPARISON:  04/06/2022 FINDINGS: Cardiac silhouette is unremarkable. No pneumothorax or pleural effusion. The lungs are clear. Aorta is calcified. Prosthetic right shoulder. IMPRESSION: No acute cardiopulmonary process. Electronically Signed   By: JSammie BenchM.D.   On: 06/14/2022 14:09     Subjective: Patient was seen and examined at bed side, Colonoscopy is deferred and will be done as an outpatient, patient  wants to be discharged.   Discharge Exam: Vitals:   07/01/22 0909 07/01/22 1250  BP: (!) 145/70 (!) 137/94  Pulse: (!) 108 92  Resp: 18   Temp: 97.6 F (36.4 C)   SpO2: 100% 100%   Vitals:   07/01/22 0115 07/01/22 0500 07/01/22 0909 07/01/22 1250  BP: (!) 151/105 128/73 (!) 145/70 (!) 137/94  Pulse: 96 100 (!) 108 92  Resp: '17 17 18   '$ Temp: 98.3 F (36.8 C) 97.6 F (36.4 C) 97.6 F (36.4 C)   TempSrc: Oral Oral Oral   SpO2: 99% 99% 100% 100%  Weight:      Height:        General: Pt is alert, awake, not in acute  distress Cardiovascular: RRR, S1/S2 +, no rubs, no gallops Respiratory: CTA bilaterally, no wheezing, no rhonchi Abdominal: Soft, NT, ND, bowel sounds + Extremities: no edema, no cyanosis    The results of significant diagnostics from this hospitalization (including imaging, microbiology, ancillary and laboratory) are listed below for reference.     Microbiology: Recent Results (from the past 240 hour(s))  Urine Culture     Status: Abnormal   Collection Time: 06/22/22 12:13 PM   Specimen: Urine, Clean Catch  Result Value Ref Range Status   Specimen Description URINE, CLEAN CATCH  Final   Special Requests NONE  Final   Culture (A)  Final    <10,000 COLONIES/mL INSIGNIFICANT GROWTH Performed at [facility], [address] [place], [place] [address]    Report Status 06/23/2022 FINAL  Final     Labs: BNP (last 3 results) Recent Labs    01/31/22 1212 04/06/22 1600 06/14/22 1431  BNP 589.2* 1,150.2* [id number]*   Basic Metabolic Panel: Recent Labs  Lab 06/26/22 1456 06/30/22 1259 07/01/22 0546  NA 131* 132* 133*  K 3.1* 2.9* 3.8  CL 91* 100 104  CO2 '22 24 23  '$ GLUCOSE 84 88 111*  BUN '13 10 10  '$ CREATININE 0.72 0.75 0.68  CALCIUM 9.0 8.5* 8.9  MG  --   --  2.0  PHOS  --   --  2.6   Liver Function Tests: Recent Labs  Lab 06/30/22 1259 07/01/22 0546  AST 35 32  ALT 22 24  ALKPHOS 85 91  BILITOT 0.7 0.5  PROT 6.1* 6.4*  ALBUMIN 3.5 3.5   No results for input(s): "LIPASE", "AMYLASE" in the last 168 hours. No results for input(s): "AMMONIA" in the last 168 hours. CBC: Recent Labs  Lab 06/30/22 1259 07/01/22 0546  WBC 6.4 8.4  HGB 10.3* 10.5*  HCT 32.6* 33.0*  MCV 85.1 85.7  PLT 309 298   Cardiac Enzymes: No results for input(s): "CKTOTAL", "CKMB", "CKMBINDEX", "TROPONINI" in the last 168 hours. BNP: Invalid input(s): "POCBNP" CBG: No results for input(s): "GLUCAP" in the last 168 hours. D-Dimer No results for input(s): "DDIMER" in the last 72  hours. Hgb A1c No results for input(s): "HGBA1C" in the last 72 hours. Lipid Profile No results for input(s): "CHOL", "HDL", "LDLCALC", "TRIG", "CHOLHDL", "LDLDIRECT" in the last 72 hours. Thyroid function studies No results for input(s): "TSH", "T4TOTAL", "T3FREE", "THYROIDAB" in the last 72 hours.  Invalid input(s): "FREET3" Anemia work up No results for input(s): "VITAMINB12", "FOLATE", "FERRITIN", "TIBC", "IRON", "RETICCTPCT" in the last 72 hours. Urinalysis    Component Value Date/Time   COLORURINE STRAW (A) 06/14/2022 2112   APPEARANCEUR CLEAR 06/14/2022 2112   APPEARANCEUR Clear 05/22/2021 1055   LABSPEC 1.010 06/14/2022 2112  PHURINE 7.0 06/14/2022 2112   GLUCOSEU NEGATIVE 06/14/2022 2112   HGBUR NEGATIVE 06/14/2022 2112   BILIRUBINUR negative 06/22/2022 1141   BILIRUBINUR Negative 05/22/2021 1055   KETONESUR negative 06/22/2022 [place] 06/14/2022 2112   PROTEINUR negative 06/22/2022 1141   PROTEINUR NEGATIVE 06/14/2022 2112   UROBILINOGEN 0.2 06/22/2022 1141   NITRITE Negative 06/22/2022 1141   NITRITE NEGATIVE 06/14/2022 2112   LEUKOCYTESUR Trace (A) 06/22/2022 1141   LEUKOCYTESUR NEGATIVE 06/14/2022 2112   Sepsis Labs Recent Labs  Lab 06/30/22 1259 07/01/22 0546  WBC 6.4 8.4   Microbiology Recent Results (from the past 240 hour(s))  Urine Culture     Status: Abnormal   Collection Time: 06/22/22 12:13 PM   Specimen: Urine, Clean Catch  Result Value Ref Range Status   Specimen Description URINE, CLEAN CATCH  Final   Special Requests NONE  Final   Culture (A)  Final    <10,000 COLONIES/mL INSIGNIFICANT GROWTH Performed at [facility], [place] [address] [place], [place] [address]    Report Status 06/23/2022 FINAL  Final     Time coordinating discharge: Over 30 minutes  SIGNED:   Duard Brady, MD  [facility] 07/01/2022, 6:41 PM Pager   If 7PM-7AM, please contact night-coverage

## 2022-07-01 NOTE — Progress Notes (Signed)
Mobility Specialist - Progress Note   07/01/22 0857  Mobility  Activity Ambulated with assistance in hallway  Level of Assistance Modified independent, requires aide device or extra time  Assistive Device None  Distance Ambulated (ft) 400 ft  Activity Response Tolerated well  Mobility Referral Yes  $Mobility charge 1 Mobility   Pt received in hallway and agreed to mobility had some cramping in LLE. Pt returned to chair with all needs met.   Roderick Pee Mobility Specialist

## 2022-07-01 NOTE — Discharge Instructions (Signed)
Advised to follow up PCP in one week. Advised to follow up Dr. [name], [name] in 2 weeks for outpatient colonoscopy

## 2022-07-01 NOTE — Consult Note (Signed)
Reason for Consult: Anemia and heme positive stool Referring Physician: [facility]  Teena Dunk HPI: This is a 77 year old female with a P:MH of COPD, dementia, history of CVA, history of breast cancer, afib on Eliquis, HTN, and CHF admitted for complaints of nausea and vomiting.  The symptoms started acutely several days ago and there was no associated fever, diarrhea, or abdominal pain.  Imaging of her abdomen in February 2024 for complaints of abdominal pain and diarrhea did not show any severe abnormalities, ie, masses.  She has a baseline level of anemia in the 11 range, but her HGB was noted to have a mild decline down to 10.5 g/dL.  There were no reports of hematochezia, melena, or hematemesis.  Past Medical History:  Diagnosis Date   Anxiety    Breast cancer ([place])    Chronic combined systolic and diastolic CHF (congestive heart failure) ([place])    a. Echo 11/2021: LVEF of 40-45% with global hypokinesis, mildly reduced RV systolic function, and moderate MR   COPD (chronic obstructive pulmonary disease) (HCC)    Dementia (HCC)    Depression    HTN (hypertension)    Paroxysmal atrial fibrillation ([place]) 2017   Stroke [facility])     Past Surgical History:  Procedure Laterality Date   ABDOMINAL HYSTERECTOMY     CARDIOVERSION N/A 02/19/2022   Procedure: CARDIOVERSION;  Surgeon: Freada Bergeron, MD;  Location: [place];  Service: Cardiovascular;  Laterality: N/A;   REVERSE SHOULDER ARTHROPLASTY Right 10/21/2021   Procedure: RIGHT REVERSE TOTAL SHOULDER ARTHROPLASTY;  Surgeon: Hiram Gash, MD;  Location: [place];  Service: Orthopedics;  Laterality: Right;  allen bed, spider, tornier (rep aware), open shoulder tray.    Family History  Problem Relation Age of Onset   Arrhythmia Father     Social History:  reports that she has quit smoking. Her smoking use included cigarettes. She smoked an average of .5 packs per day. She does not have any smokeless tobacco history on file.  She reports that she does not currently use alcohol after a past usage of about 6.0 standard drinks of alcohol per week. She reports that she does not currently use drugs.  Allergies:  Allergies  Allergen Reactions   Influenza Virus Vaccine Hives   Myrbetriq [Mirabegron Er]     Confusion, agitation, hallucination    Pneumococcal Vaccine Hives   Ativan [Lorazepam]     Makes pt agitated per daughter     Medications: Scheduled:  atorvastatin  80 mg Oral Daily   buPROPion  75 mg Oral BID   docusate sodium  100 mg Oral BID   losartan  25 mg Oral Daily   memantine  10 mg Oral BID   metoprolol succinate  50 mg Oral BID   pantoprazole (PROTONIX) IV  40 mg Intravenous Q12H   QUEtiapine  25 mg Oral Once   sertraline  50 mg Oral Daily   Continuous:  Results for orders placed or performed during the hospital encounter of 06/30/22 (from the past 24 hour(s))  Comprehensive metabolic panel     Status: Abnormal   Collection Time: 07/01/22  5:46 AM  Result Value Ref Range   Sodium 133 (L) 135 - 145 mmol/L   Potassium 3.8 3.5 - 5.1 mmol/L   Chloride 104 98 - 111 mmol/L   CO2 23 22 - 32 mmol/L   Glucose, Bld 111 (H) 70 - 99 mg/dL   BUN 10 8 - 23 mg/dL   Creatinine,  Ser 0.68 0.44 - 1.00 mg/dL   Calcium 8.9 8.9 - 10.3 mg/dL   Total Protein 6.4 (L) 6.5 - 8.1 g/dL   Albumin 3.5 3.5 - 5.0 g/dL   AST 32 15 - 41 U/L   ALT 24 0 - 44 U/L   Alkaline Phosphatase 91 38 - 126 U/L   Total Bilirubin 0.5 0.3 - 1.2 mg/dL   GFR, Estimated >60 >60 mL/min   Anion gap 6 5 - 15  CBC     Status: Abnormal   Collection Time: 07/01/22  5:46 AM  Result Value Ref Range   WBC 8.4 4.0 - 10.5 K/uL   RBC 3.85 (L) 3.87 - 5.11 MIL/uL   Hemoglobin 10.5 (L) 12.0 - 15.0 g/dL   HCT 33.0 (L) 36.0 - 46.0 %   MCV 85.7 80.0 - 100.0 fL   MCH 27.3 26.0 - 34.0 pg   MCHC 31.8 30.0 - 36.0 g/dL   RDW 14.4 11.5 - 15.5 %   Platelets 298 150 - 400 K/uL   nRBC 0.0 0.0 - 0.2 %  Magnesium     Status: None   Collection Time:  07/01/22  5:46 AM  Result Value Ref Range   Magnesium 2.0 1.7 - 2.4 mg/dL  Phosphorus     Status: None   Collection Time: 07/01/22  5:46 AM  Result Value Ref Range   Phosphorus 2.6 2.5 - 4.6 mg/dL     No results found.  ROS:  As stated above in the HPI otherwise negative.  Blood pressure (!) 137/94, pulse 92, temperature 97.6 F (36.4 C), temperature source Oral, resp. rate 18, height '5\' 4"'$  (1.626 m), weight 52 kg, SpO2 100 %.    PE: Gen: NAD, Alert and Oriented HEENT:  [place]/AT, EOMI Neck: Supple, no LAD Lungs: CTA Bilaterally CV: RRR without M/G/R ABD: Soft, NTND, +BS Ext: No C/C/E  Assessment/Plan: 1) Anemia. 2) Heme positive stool. 3) Dementia.   I had a long discussion with her daughter.  We discussed the pros and cons of performing a colonoscopy with her mother's dementia and other significant comorbidities.  It may not be in her mother's long term best interest for invasive procedures.  There is no emergent need with performing the colonoscopy as the anemia is not new.  There is only a mild worsening and there is no overt display of bleeding.  She and her daughter will follow up in the office in 2-4 weeks and her HGB will be monitored.  Further discussion for further work up will be performed in light of serial HGBs.  Plan: 1) Follow up in the office in 2-4 weeks.  Barett Whidbee D 07/01/2022, 3:05 PM

## 2022-07-01 NOTE — Progress Notes (Signed)
PROGRESS NOTE    Marisa Nunez  [id number] DOB: 05/11/1945 DOA: 06/30/2022 PCP: [name], MD   Brief Narrative:  This 77 y.o. female with PMH significant for dementia, COPD, history of CVA, history of breast cancer, paroxysmal A-fib on Eliquis, depression, essential hypertension, CHF presented to the ED with complaints of nausea and vomiting.  Patient reports having these symptoms for last couple of days, she denies any abdominal pain, denies any headache, dizziness, palpitations.  She denies any fever, sick contacts, recent travel.  She denies any urinary tract symptoms, blood in the urine or stools.  Daughter reports she has been having low blood pressure for last few days.  ED workup showed hemoglobin 10.3 stool for occult blood+.  Shows atrial fibrillation with RVR. Admitted for occult GI bleed.  GI is consulted.  Assessment & Plan:   Principal Problem:   Occult GI bleeding Active Problems:   Essential hypertension   History of right breast cancer   History of tobacco use   Mixed hyperlipidemia   PAF (paroxysmal atrial fibrillation) (HCC)   Dementia (HCC)  Occult GI bleed: Patient presented with generalized weakness and nausea. Hemoglobin 10.3 low from 11.6 two days ago in setting of eliquis use. Patient denies any visible GI bleeding. Stool for occult blood positive. Monitor H&H every 12 hours. Continue pantoprazole 40 mg IV every 12 hours. Continue IV fluid resuscitation. GI Dr. [name]  is consulted, awaiting recommendation. Patient is due for colonoscopy, may need during hospitalization.   Dehydration: Continue IV fluid resuscitation.   Atrial fibrillation with RVR: Patient was tachycardic on arrival, She was given Cardizem once. Heart rate is now controlled. Continue metoprolol. Hold Eliquis.  Continue SCDs   Essential Hypertension: Continue losartan, metoprolol.   Dementia: Stable.  Denies behavioral problems Continue Namenda, Seroquel.   Tobacco  use: Counseled on quit smoking.   History of breast cancer: Stable.  Follow-up outpatient oncology.   Major depression: Continue Zoloft.   HyperLipidemia: Continue Lipitor   DVT prophylaxis: SCDs Code Status: DNR Family Communication: Spoke with daughter Disposition Plan:    Status is: Observation The patient remains OBS appropriate and will d/c before 2 midnights.   Admitted for occult GI bleed,  may require GI evaluation.  Consultants:  Gastroenterology  Procedures: None  Antimicrobials: None  Subjective: Patient was seen and examined at bedside.  Overnight events noted.   Patient reports doing much better.  She refused to have colonoscopy, H/ H remains stable.  Objective: Vitals:   07/01/22 0115 07/01/22 0500 07/01/22 0909 07/01/22 1250  BP: (!) 151/105 128/73 (!) 145/70 (!) 137/94  Pulse: 96 100 (!) 108 92  Resp: '17 17 18   '$ Temp: 98.3 F (36.8 C) 97.6 F (36.4 C) 97.6 F (36.4 C)   TempSrc: Oral Oral Oral   SpO2: 99% 99% 100% 100%  Weight:      Height:        Intake/Output Summary (Last 24 hours) at 07/01/2022 1510 Last data filed at 07/01/2022 0500 Gross per 24 hour  Intake 425 ml  Output --  Net 425 ml   Filed Weights   06/30/22 1218  Weight: 52 kg    Examination:  General exam: Appears comfortable, not in any acute distress, deconditioned. Respiratory system: CTA bilaterally, respiratory for normal, RR 15 Cardiovascular system: S1 & S2 heard, regular rate and rhythm, no murmur. Gastrointestinal system: Abdomen is soft, non tender, non distended, BS+ Central nervous system: Alert and oriented x 2. No focal neurological deficits.  Extremities: No edema, no cyanosis, no clubbing Skin: No rashes, lesions or ulcers Psychiatry: Mood & affect appropriate.     Data Reviewed: I have personally reviewed following labs and imaging studies  CBC: Recent Labs  Lab 06/30/22 1259 07/01/22 0546  WBC 6.4 8.4  HGB 10.3* 10.5*  HCT 32.6* 33.0*  MCV  85.1 85.7  PLT 309 [id number]   Basic Metabolic Panel: Recent Labs  Lab 06/26/22 1456 06/30/22 1259 07/01/22 0546  NA 131* 132* 133*  K 3.1* 2.9* 3.8  CL 91* 100 104  CO2 '22 24 23  '$ GLUCOSE 84 88 111*  BUN '13 10 10  '$ CREATININE 0.72 0.75 0.68  CALCIUM 9.0 8.5* 8.9  MG  --   --  2.0  PHOS  --   --  2.6   GFR: Estimated Creatinine Clearance: 50.6 mL/min (by C-G formula based on SCr of 0.68 mg/dL). Liver Function Tests: Recent Labs  Lab 06/30/22 1259 07/01/22 0546  AST 35 32  ALT 22 24  ALKPHOS 85 91  BILITOT 0.7 0.5  PROT 6.1* 6.4*  ALBUMIN 3.5 3.5   No results for input(s): "LIPASE", "AMYLASE" in the last 168 hours. No results for input(s): "AMMONIA" in the last 168 hours. Coagulation Profile: No results for input(s): "INR", "PROTIME" in the last 168 hours. Cardiac Enzymes: No results for input(s): "CKTOTAL", "CKMB", "CKMBINDEX", "TROPONINI" in the last 168 hours. BNP (last 3 results) No results for input(s): "PROBNP" in the last 8760 hours. HbA1C: No results for input(s): "HGBA1C" in the last 72 hours. CBG: No results for input(s): "GLUCAP" in the last 168 hours. Lipid Profile: No results for input(s): "CHOL", "HDL", "LDLCALC", "TRIG", "CHOLHDL", "LDLDIRECT" in the last 72 hours. Thyroid Function Tests: No results for input(s): "TSH", "T4TOTAL", "FREET4", "T3FREE", "THYROIDAB" in the last 72 hours. Anemia Panel: No results for input(s): "VITAMINB12", "FOLATE", "FERRITIN", "TIBC", "IRON", "RETICCTPCT" in the last 72 hours. Sepsis Labs: No results for input(s): "PROCALCITON", "LATICACIDVEN" in the last 168 hours.  Recent Results (from the past 240 hour(s))  Urine Culture     Status: Abnormal   Collection Time: 06/22/22 12:13 PM   Specimen: Urine, Clean Catch  Result Value Ref Range Status   Specimen Description URINE, CLEAN CATCH  Final   Special Requests NONE  Final   Culture (A)  Final    <10,000 COLONIES/mL INSIGNIFICANT GROWTH Performed at [facility], [address] [place], [place] [address]    Report Status 06/23/2022 FINAL  Final    Radiology Studies: No results found.  Scheduled Meds:  atorvastatin  80 mg Oral Daily   buPROPion  75 mg Oral BID   docusate sodium  100 mg Oral BID   losartan  25 mg Oral Daily   memantine  10 mg Oral BID   metoprolol succinate  50 mg Oral BID   pantoprazole (PROTONIX) IV  40 mg Intravenous Q12H   QUEtiapine  25 mg Oral Once   sertraline  50 mg Oral Daily   Continuous Infusions:   LOS: 0 days    Time spent: 35 mins    Duard Brady, MD [facility]   If 7PM-7AM, please contact night-coverage

## 2022-07-01 NOTE — Progress Notes (Signed)
Patient signed out via [place] by the patient's daughter/care giver, [name]. Patient was alert, stable and pleasant at time of discharge. RN informed [name] that if she or the patient had any questions they are able to reach out to the unit via phone call for more information.

## 2022-07-01 NOTE — Progress Notes (Signed)
  Transition of Care [facility]) Screening Note   Patient Details  Name: Marisa Nunez Date of Birth: 1945-06-14   Transition of Care [facility]) CM/SW Contact:    Vassie Moselle, LCSW Phone Number: 07/01/2022, 9:10 AM    Transition of Care Department [facility]) has reviewed patient and no TOC needs have been identified at this time. We will continue to monitor patient advancement through interdisciplinary progression rounds. If new patient transition needs arise, please place a TOC consult.

## 2022-07-02 ENCOUNTER — Telehealth: Payer: Self-pay

## 2022-07-02 NOTE — Transitions of Care (Post Inpatient/ED Visit) (Signed)
   07/02/2022  Name: Marisa Nunez MRN: [id number] DOB: 01-29-1946  Today's TOC FU Call Status: Today's TOC FU Call Status:: Successful TOC FU Call Competed TOC FU Call Complete Date: 07/02/22  Transition Care Management Follow-up Telephone Call Date of Discharge: 07/01/22 Discharge Facility: [facility]) Type of Discharge: Inpatient Admission Primary Inpatient Discharge Diagnosis:: afib How have you been since you were released from the hospital?: Better Any questions or concerns?: No  Items Reviewed: Did you receive and understand the discharge instructions provided?: Yes Medications obtained and verified?: Yes (Medications Reviewed) Any new allergies since your discharge?: No Dietary orders reviewed?: Yes Do you have support at home?: Yes People in Home: child(ren), adult  Home Care and Equipment/Supplies: [place] Ordered?: NA Any new equipment or medical supplies ordered?: NA  Functional Questionnaire: Do you need assistance with bathing/showering or dressing?: No Do you need assistance with meal preparation?: No Do you need assistance with eating?: No Do you have difficulty maintaining continence: No Do you need assistance with getting out of bed/getting out of a chair/moving?: No Do you have difficulty managing or taking your medications?: No  Folllow up appointments reviewed: PCP Follow-up appointment confirmed?: No (no avail appt , sent to staff to schedule) MD Provider Line [phone] Given: No [facility] Follow-up appointment confirmed?: NA Do you need transportation to your follow-up appointment?: No Do you understand care options if your condition(s) worsen?: Yes-patient verbalized understanding    [place], [place] Nurse Health Advisor Direct Dial [phone]

## 2022-07-03 ENCOUNTER — Telehealth: Payer: Self-pay

## 2022-07-03 NOTE — Telephone Encounter (Signed)
A RN from [facility] is calling with a report on PT .  Name :  Marisa Nunez  Tele : [phone]

## 2022-07-03 NOTE — Telephone Encounter (Signed)
Spoke with Velna Hatchet, NP  from [facility].  Works with [employer] company to coordinate care for patient.  Routine care done for patient at home.  Patient has had frequent ER visits for Nausea and lower back pain.  Patient has increased Anxiety and walks frequently at home pacing due to her Dementia.  Was started on Zofran for the Nausea on Sunday after an ER visit.  Requested to go to the ER again on Monday and was found  to have a GI Bleed.  Labs were repeated within a week and were stable.  Question about doing a Colonoscopy was mentioned.  Daughter would like to have done as an outpatient.  Patient is eating and drinking well. B/P was a little low at times. Was encouraged to drink more fluids.  B/P monitor to be ordered. Patient has been taking Tylenol and Aleve for back pain . Was asked to stop Aleve at this point.  Lidocaine Patches have been ordered for patient's back pain.  Patient has been placed on Seroquel to help to manage her increased Anxiety during the day which is thought to increase the back pain.  Suggestion to start patient on a PPI.Marland Kitchen  Asked when patient's next appointment is in the Clinics. Has an appointment on 07/16/2022.  Asked if perhaps patient could come in sooner for lab follow up.  Call to daughter offered earlier appointment did not feel patient needed to come in for the appointment  on 07/09/2022.  Brandi NP was called and informed of daughters decision not to take the earlier appointment.

## 2022-07-10 ENCOUNTER — Ambulatory Visit (HOSPITAL_COMMUNITY)
Admission: EM | Admit: 2022-07-10 | Discharge: 2022-07-10 | Disposition: A | Discharge status: Home / Self Care | Payer: Medicare Other

## 2022-07-10 DIAGNOSIS — F03918 Unspecified dementia, unspecified severity, with other behavioral disturbance: Secondary | ICD-10-CM

## 2022-07-10 NOTE — Discharge Instructions (Addendum)

## 2022-07-10 NOTE — BH Assessment (Signed)
After the security scan and vitals were completed, TTS attempted to complete the triage/screening, suicide risk assessment, and review waiver of medical screenings.   However, the provider roomed patient and completed the MSE process, followed by dispositioning the patient before the Triage/Screening process could be initiated.
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
## 2022-07-10 NOTE — ED Notes (Signed)
Patient discharged by provider Beatriz Stallion, NP with written and verbal instructions. Resources provided.
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
## 2022-07-16 ENCOUNTER — Encounter: Payer: Medicare Other | Admitting: Internal Medicine

## 2022-08-06 ENCOUNTER — Other Ambulatory Visit: Payer: Self-pay

## 2022-08-06 MED ORDER — SERTRALINE HCL 50 MG PO TABS: ORAL_TABLET | ORAL | 0 refills | Status: DC

## 2022-08-12 NOTE — Progress Notes (Deleted)
[facility]: Clinic Note  Subjective:  History of Present Illness: Marisa Nunez is a 77 y.o. year old female who presents for establishing care with me. She is known our clinic, saw Jason Fila on 06/26/22.   3/14[facility] for dementia with behavior disturbances  Daughter- Rollene Fare in law- Brett Canales  Need a good med rec- can probably decrease BP meds  Neurology managing behavior meds. Dr. Metta Clines, [facility]. Seroquel  BID.   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    Please refer to Assessment and Plan below for full details in Problem-Based Charting.   Past Medical History:  Patient Active Problem List   Diagnosis Date Noted   Occult GI bleeding 06/30/2022   Hypotension 06/27/2022   Weight loss 06/27/2022   Persistent atrial fibrillation    COPD (chronic obstructive pulmonary disease) 10/19/2021   Dementia 10/16/2021   Overactive bladder 05/24/2021   Vaginal lesion 05/24/2021   History of TIA 10/30/2020   History of cerebral infarction 10/29/2020   History of right breast cancer 02/25/2018   History of tobacco use 05/06/2017   Mixed hyperlipidemia 05/06/2017   Osteopenia of multiple sites 05/06/2017   Essential hypertension 10/25/2015   PAF (paroxysmal atrial fibrillation) 10/25/2015    Social History: Reviewed in [place]. Pertinent updates today include: ***  Family History: Reviewed in Epic. Pertinent updates today include: None  Medications:  Current Outpatient Medications:    acetaminophen (TYLENOL) 500 MG tablet, Take 1 tablet (500 mg total) by mouth every 6 (six) hours as needed for moderate pain, fever or mild pain., Disp: 30 tablet, Rfl: 0   albuterol (VENTOLIN HFA) 108 (90 Base) MCG/ACT inhaler, Inhale 2 puffs into the lungs every 4 (four) hours as needed for wheezing or shortness of breath., Disp: , Rfl:    apixaban (ELIQUIS) 5 MG TABS tablet, TAKE 1 TABLET(5  MG) BY MOUTH TWICE DAILY (Patient taking differently: Take 5 mg by mouth 2 (two) times daily.), Disp: 42 tablet, Rfl: 0   furosemide (LASIX) 20 MG tablet, Take 2 tablets (40 mg total) by mouth daily., Disp: 90 tablet, Rfl: 3   losartan (COZAAR) 50 MG tablet, Take 0.5 tablets (25 mg total) by mouth daily., Disp: 90 tablet, Rfl: 3   memantine (NAMENDA) 10 MG tablet, Take 10 mg by mouth 2 (two) times daily., Disp: , Rfl:    metoprolol succinate (TOPROL-XL) 50 MG 24 hr tablet, Take 1 tablet (50 mg total) by mouth 2 (two) times daily., Disp: 60 tablet, Rfl: 3   Multiple Vitamins-Minerals (MULTIVITAMIN WITH MINERALS) tablet, Take 1 tablet by mouth daily., Disp: , Rfl:    ondansetron (ZOFRAN-ODT) 4 MG disintegrating tablet, Take 4 mg by mouth every 8 (eight) hours as needed for vomiting or nausea., Disp: , Rfl:    potassium chloride (KLOR-CON M) 10 MEQ tablet, TAKE 1 TABLET(10 MEQ) BY MOUTH TWICE DAILY (Patient taking differently: Take 10 mEq by mouth 2 (two) times daily.), Disp: 90 tablet, Rfl: 2   QUEtiapine (SEROQUEL) 25 MG tablet, Take 25 mg by mouth at bedtime., Disp: , Rfl:    sertraline (ZOLOFT) 50 MG tablet, TAKE 1 TABLET(50 MG) BY MOUTH DAILY, Disp: 90 tablet, Rfl: 0   spironolactone (ALDACTONE) 25 MG tablet, Take 0.5 tablets (12.5 mg total) by mouth daily., Disp: 90 tablet, Rfl: 3   Allergies: Allergies  Allergen Reactions   Influenza Virus Vaccine Hives   Myrbetriq Theodosia Paling  Er]     Confusion, agitation, hallucination    Pneumococcal Vaccine Hives   Ativan [Lorazepam]     Makes pt agitated per daughter     Review of Systems: ROS   Objective:   Vitals: There were no vitals filed for this visit.  Physical Exam: Physical Exam   Data: Labs, imaging, and micro were reviewed in Epic. Refer to Assessment and Plan below for full details in Problem-Based Charting.  Assessment & Plan:  No problem-specific Assessment & Plan notes found for this encounter.     Patient will follow up  in ***  Mercie Eon, MD

## 2022-08-13 ENCOUNTER — Encounter: Payer: Medicare Other | Admitting: Internal Medicine

## 2022-08-26 ENCOUNTER — Other Ambulatory Visit: Payer: Self-pay

## 2022-08-27 MED ORDER — MEMANTINE HCL 10 MG PO TABS: 10.0000 mg | ORAL_TABLET | Freq: Two times a day (BID) | ORAL | 3 refills | Status: DC

## 2022-11-03 ENCOUNTER — Other Ambulatory Visit: Payer: Self-pay | Admitting: Internal Medicine

## 2022-11-03 NOTE — Telephone Encounter (Signed)
Approved short term refill of potassium - please have patient schedule PCP or resident follow up. Thanks!

## 2022-11-04 ENCOUNTER — Other Ambulatory Visit: Payer: Self-pay

## 2022-11-04 MED ORDER — SERTRALINE HCL 50 MG PO TABS: ORAL_TABLET | ORAL | 0 refills | Status: DC

## 2022-11-24 ENCOUNTER — Other Ambulatory Visit: Payer: Self-pay

## 2022-11-24 ENCOUNTER — Telehealth: Payer: Self-pay | Admitting: *Deleted

## 2022-11-24 ENCOUNTER — Emergency Department (HOSPITAL_COMMUNITY): Payer: Medicare Other

## 2022-11-24 ENCOUNTER — Encounter (HOSPITAL_COMMUNITY): Payer: Self-pay

## 2022-11-24 ENCOUNTER — Emergency Department (HOSPITAL_COMMUNITY)
Admission: EM | Admit: 2022-11-24 | Discharge: 2022-11-24 | Discharge status: Against Medical Advice | Payer: Medicare Other | Attending: Emergency Medicine | Admitting: Emergency Medicine

## 2022-11-24 DIAGNOSIS — S0083XA Contusion of other part of head, initial encounter: Secondary | ICD-10-CM | POA: Insufficient documentation

## 2022-11-24 DIAGNOSIS — Y92002 Bathroom of unspecified non-institutional (private) residence single-family (private) house as the place of occurrence of the external cause: Secondary | ICD-10-CM | POA: Insufficient documentation

## 2022-11-24 DIAGNOSIS — F0390 Unspecified dementia without behavioral disturbance: Secondary | ICD-10-CM | POA: Insufficient documentation

## 2022-11-24 DIAGNOSIS — W0110XA Fall on same level from slipping, tripping and stumbling with subsequent striking against unspecified object, initial encounter: Secondary | ICD-10-CM | POA: Diagnosis not present

## 2022-11-24 DIAGNOSIS — S0990XA Unspecified injury of head, initial encounter: Secondary | ICD-10-CM | POA: Diagnosis present

## 2022-11-24 LAB — BASIC METABOLIC PANEL
Anion gap: 11 (ref 5–15)
BUN: 13 mg/dL (ref 8–23)
CO2: 24 mmol/L (ref 22–32)
Calcium: 8.7 mg/dL — ABNORMAL LOW (ref 8.9–10.3)
Chloride: 93 mmol/L — ABNORMAL LOW (ref 98–111)
Creatinine, Ser: 0.68 mg/dL (ref 0.44–1.00)
GFR, Estimated: >60 mL/min (ref >60)
Glucose, Bld: 91 mg/dL (ref 70–99)
Potassium: 3.7 mmol/L (ref 3.5–5.1)
Sodium: 128 mmol/L — ABNORMAL LOW (ref 135–145)

## 2022-11-24 LAB — URINALYSIS, ROUTINE W REFLEX MICROSCOPIC
Bilirubin Urine: NEGATIVE
Glucose, UA: NEGATIVE mg/dL
Hgb urine dipstick: NEGATIVE
Ketones, ur: NEGATIVE mg/dL
Nitrite: NEGATIVE
Protein, ur: NEGATIVE mg/dL
Specific Gravity, Urine: 1.003 — ABNORMAL LOW (ref 1.005–1.030)
pH: 6 (ref 5.0–8.0)

## 2022-11-24 LAB — CBC WITH DIFFERENTIAL/PLATELET
Abs Immature Granulocytes: 0.04 10*3/uL (ref 0.00–0.07)
Basophils Absolute: 0.1 10*3/uL (ref 0.0–0.1)
Basophils Relative: 1 %
Eosinophils Absolute: 0.2 10*3/uL (ref 0.0–0.5)
Eosinophils Relative: 2 %
HCT: 37.5 % (ref 36.0–46.0)
Hemoglobin: 11.7 g/dL — ABNORMAL LOW (ref 12.0–15.0)
Immature Granulocytes: 0 %
Lymphocytes Relative: 12 %
Lymphs Abs: 1.1 10*3/uL (ref 0.7–4.0)
MCH: 25.9 pg — ABNORMAL LOW (ref 26.0–34.0)
MCHC: 31.2 g/dL (ref 30.0–36.0)
MCV: 83 fL (ref 80.0–100.0)
Monocytes Absolute: 0.8 10*3/uL (ref 0.1–1.0)
Monocytes Relative: 9 %
Neutro Abs: 7 10*3/uL (ref 1.7–7.7)
Neutrophils Relative %: 76 %
Platelets: 363 10*3/uL (ref 150–400)
RBC: 4.52 MIL/uL (ref 3.87–5.11)
RDW: 16.7 % — ABNORMAL HIGH (ref 11.5–15.5)
WBC: 9.2 10*3/uL (ref 4.0–10.5)
nRBC: 0 % (ref 0.0–0.2)

## 2022-11-24 NOTE — ED Triage Notes (Signed)
Pt arrives with c/o fall that happened late Saturday night. Pt unsure of when she fell. Pt has hx of dementia. Pt did hit her head and takes eliquis. Pt has a small hematoma right forehead.

## 2022-11-24 NOTE — ED Provider Triage Note (Signed)
Emergency Medicine Provider Triage Evaluation Note  Marisa Nunez , a 77 y.o. female  was evaluated in triage.  Pt complains of fall on thinners.  Patient rolled out of bed Saturday night/Sunday morning stating that she went to go to the bathroom when her foot slipped out from underneath her and she fell and hit her head.  Patient denies LOC, head pain or neck pain.  Patient does have history of dementia as per the daughter.  Patient does take Eliquis for A-fib.  Patient denies any dysuria.  Review of Systems  Positive: See HPI Negative: See HPI  Physical Exam  BP (!) 150/106   Pulse 93   LMP  (LMP Unknown)   SpO2 100%  Gen:   Awake, no distress   Resp:  Normal effort  MSK:   Moves extremities without difficulty  Other:  Pupils PERRL bilaterally, hematoma noted to right forehead not actively bleeding, no midline cervical tenderness, symmetrical smile, 5 out of 5 shoulder shrug, reassuring nose to finger test  Medical Decision Making  Medically screening exam initiated at 4:44 PM.  Appropriate orders placed.  Marisa Nunez was informed that the remainder of the evaluation will be completed by another provider, this initial triage assessment does not replace that evaluation, and the importance of remaining in the ED until their evaluation is complete.  Workup initiated, patient stable at this time   Remi Deter 11/24/22 1645

## 2022-11-24 NOTE — Telephone Encounter (Signed)
Spoke with today's Attending. [name] notified to take patient directly to ED for head imaging to r/o potential bleed. States she will take her now.

## 2022-11-24 NOTE — Telephone Encounter (Signed)
Agree with ED evaluation. Fall was unwitnessed and with dementia she may not be able to endorse symptoms. This would be better evaluated in ED setting. Thanks.

## 2022-11-24 NOTE — Telephone Encounter (Signed)
Following message sent to [employer] today:  Mom fell out of bed hit her face and her hip is sore   Spoke with daughter, [name]. States patient fell out of bed between Sat night 7/27 and Sunday morning 7/28. Patient told daughter about fall Sunday AM as she did not want to wake her. Patient did not want to go to ED. States she hit her head above right eye, which is now sore. Patient has dementia, but told daughter she did not lose consciousness. Daughter has not seen any changes in patient's behavior since this occurred. Patient is on long term Eliquis for A Fib. Patient already had first available appt on 8/8. Discussed going to ED now for head imaging. She would like to know what PCP advises.

## 2022-12-04 ENCOUNTER — Encounter: Payer: Medicare Other | Admitting: Student

## 2022-12-15 ENCOUNTER — Encounter: Payer: Medicare Other | Admitting: Student

## 2022-12-25 ENCOUNTER — Emergency Department (HOSPITAL_COMMUNITY)
Admission: EM | Admit: 2022-12-25 | Discharge: 2022-12-29 | Disposition: A | Discharge status: Home / Self Care | Payer: Medicare Other | Attending: Emergency Medicine | Admitting: Emergency Medicine

## 2022-12-25 ENCOUNTER — Encounter (HOSPITAL_COMMUNITY): Payer: Self-pay

## 2022-12-25 ENCOUNTER — Other Ambulatory Visit: Payer: Self-pay | Admitting: Internal Medicine

## 2022-12-25 ENCOUNTER — Emergency Department (HOSPITAL_COMMUNITY): Payer: Medicare Other

## 2022-12-25 ENCOUNTER — Other Ambulatory Visit: Payer: Self-pay

## 2022-12-25 DIAGNOSIS — Z8673 Personal history of transient ischemic attack (TIA), and cerebral infarction without residual deficits: Secondary | ICD-10-CM | POA: Insufficient documentation

## 2022-12-25 DIAGNOSIS — J449 Chronic obstructive pulmonary disease, unspecified: Secondary | ICD-10-CM | POA: Diagnosis not present

## 2022-12-25 DIAGNOSIS — Z87891 Personal history of nicotine dependence: Secondary | ICD-10-CM | POA: Diagnosis not present

## 2022-12-25 DIAGNOSIS — R4689 Other symptoms and signs involving appearance and behavior: Secondary | ICD-10-CM | POA: Diagnosis present

## 2022-12-25 DIAGNOSIS — I5042 Chronic combined systolic (congestive) and diastolic (congestive) heart failure: Secondary | ICD-10-CM | POA: Insufficient documentation

## 2022-12-25 DIAGNOSIS — Z7901 Long term (current) use of anticoagulants: Secondary | ICD-10-CM | POA: Insufficient documentation

## 2022-12-25 DIAGNOSIS — R9082 White matter disease, unspecified: Secondary | ICD-10-CM | POA: Insufficient documentation

## 2022-12-25 DIAGNOSIS — Z853 Personal history of malignant neoplasm of breast: Secondary | ICD-10-CM | POA: Insufficient documentation

## 2022-12-25 DIAGNOSIS — F0390 Unspecified dementia without behavioral disturbance: Secondary | ICD-10-CM | POA: Diagnosis present

## 2022-12-25 DIAGNOSIS — I110 Hypertensive heart disease with heart failure: Secondary | ICD-10-CM | POA: Diagnosis not present

## 2022-12-25 DIAGNOSIS — F03918 Unspecified dementia, unspecified severity, with other behavioral disturbance: Secondary | ICD-10-CM | POA: Diagnosis not present

## 2022-12-25 LAB — COMPREHENSIVE METABOLIC PANEL
ALT: 14 U/L (ref 0–44)
AST: 18 U/L (ref 15–41)
Albumin: 3.8 g/dL (ref 3.5–5.0)
Alkaline Phosphatase: 116 U/L (ref 38–126)
Anion gap: 10 (ref 5–15)
BUN: 16 mg/dL (ref 8–23)
CO2: 29 mmol/L (ref 22–32)
Calcium: 9.5 mg/dL (ref 8.9–10.3)
Chloride: 99 mmol/L (ref 98–111)
Creatinine, Ser: 0.7 mg/dL (ref 0.44–1.00)
GFR, Estimated: >60 mL/min (ref >60)
Glucose, Bld: 92 mg/dL (ref 70–99)
Potassium: 4.2 mmol/L (ref 3.5–5.1)
Sodium: 138 mmol/L (ref 135–145)
Total Bilirubin: 0.3 mg/dL (ref 0.3–1.2)
Total Protein: 7.3 g/dL (ref 6.5–8.1)

## 2022-12-25 LAB — URINALYSIS, ROUTINE W REFLEX MICROSCOPIC
Bilirubin Urine: NEGATIVE
Glucose, UA: NEGATIVE mg/dL
Ketones, ur: NEGATIVE mg/dL
Nitrite: NEGATIVE
Protein, ur: NEGATIVE mg/dL
Specific Gravity, Urine: 1.005 (ref 1.005–1.030)
pH: 7 (ref 5.0–8.0)

## 2022-12-25 LAB — RAPID URINE DRUG SCREEN, HOSP PERFORMED
Amphetamines: NOT DETECTED
Barbiturates: NOT DETECTED
Benzodiazepines: NOT DETECTED
Cocaine: NOT DETECTED
Opiates: NOT DETECTED
Tetrahydrocannabinol: NOT DETECTED

## 2022-12-25 LAB — CBC WITH DIFFERENTIAL/PLATELET
Abs Immature Granulocytes: 0.03 10*3/uL (ref 0.00–0.07)
Basophils Absolute: 0.1 10*3/uL (ref 0.0–0.1)
Basophils Relative: 1 %
Eosinophils Absolute: 0.2 10*3/uL (ref 0.0–0.5)
Eosinophils Relative: 2 %
HCT: 37.5 % (ref 36.0–46.0)
Hemoglobin: 11.7 g/dL — ABNORMAL LOW (ref 12.0–15.0)
Immature Granulocytes: 0 %
Lymphocytes Relative: 15 %
Lymphs Abs: 1.4 10*3/uL (ref 0.7–4.0)
MCH: 26.7 pg (ref 26.0–34.0)
MCHC: 31.2 g/dL (ref 30.0–36.0)
MCV: 85.4 fL (ref 80.0–100.0)
Monocytes Absolute: 0.8 10*3/uL (ref 0.1–1.0)
Monocytes Relative: 9 %
Neutro Abs: 6.9 10*3/uL (ref 1.7–7.7)
Neutrophils Relative %: 73 %
Platelets: 359 10*3/uL (ref 150–400)
RBC: 4.39 MIL/uL (ref 3.87–5.11)
RDW: 16.6 % — ABNORMAL HIGH (ref 11.5–15.5)
WBC: 9.3 10*3/uL (ref 4.0–10.5)
nRBC: 0 % (ref 0.0–0.2)

## 2022-12-25 LAB — ETHANOL: Alcohol, Ethyl (B): <10 mg/dL (ref <10)

## 2022-12-25 MED ORDER — OLANZAPINE 5 MG PO TABS: 5.0000 mg | ORAL_TABLET | Freq: Once | ORAL | Status: DC

## 2022-12-25 MED ORDER — STERILE WATER FOR INJECTION IJ SOLN
INTRAMUSCULAR | Status: AC
  Administered 2022-12-25: 10 mL
  Filled 2022-12-25: qty 10

## 2022-12-25 MED ORDER — FOSFOMYCIN TROMETHAMINE 3 G PO PACK
3.0000 g | PACK | Freq: Once | ORAL | Status: AC
  Administered 2022-12-25: 3 g via ORAL

## 2022-12-25 MED ORDER — HALOPERIDOL 1 MG PO TABS: 0.5000 mg | ORAL_TABLET | Freq: Two times a day (BID) | ORAL | Status: DC | PRN

## 2022-12-25 MED ORDER — OLANZAPINE 10 MG IM SOLR
5.0000 mg | Freq: Once | INTRAMUSCULAR | Status: AC
  Administered 2022-12-25: 5 mg via INTRAMUSCULAR
  Filled 2022-12-25: qty 10

## 2022-12-25 MED ORDER — ACETAMINOPHEN 500 MG PO TABS
1000.0000 mg | ORAL_TABLET | Freq: Once | ORAL | Status: AC
  Administered 2022-12-25: 1000 mg via ORAL

## 2022-12-25 MED ORDER — IBUPROFEN 200 MG PO TABS
400.0000 mg | ORAL_TABLET | Freq: Once | ORAL | Status: AC
  Administered 2022-12-25: 400 mg via ORAL
  Filled 2022-12-25: qty 2

## 2022-12-25 NOTE — ED Notes (Signed)
Contact for her neurologist: Dr. Metta Clines [phone]

## 2022-12-25 NOTE — ED Provider Notes (Signed)
[place] EMERGENCY DEPARTMENT AT [facility] Provider Note  CSN: [id number] Arrival date & time: 12/25/22 1305  Chief Complaint(s) Aggressive Behavior  HPI Marisa Nunez is a 77 y.o. female history of CHF, COPD, dementia with behavioral disturbance presenting to the emergency department with apparent aggressive behavior.  Per EMS, family member reported that patient grabbed scissors and was threatening herself and threatening family members.  Patient currently feels well, denies any complaints such as chest pain, headaches, nausea, vomiting, fevers, chills, abdominal pain, leg swelling, painful urination or other symptoms.  She does not remember what happened earlier today that led her to come in.  She denies any homicidal or suicidal ideation.  History is somewhat limited by dementia   Past Medical History Past Medical History:  Diagnosis Date   Anxiety    Breast cancer (HCC)    Chronic combined systolic and diastolic CHF (congestive heart failure) (HCC)    a. Echo 11/2021: LVEF of 40-45% with global hypokinesis, mildly reduced RV systolic function, and moderate MR   COPD (chronic obstructive pulmonary disease) (HCC)    Dementia (HCC)    Depression    HTN (hypertension)    Paroxysmal atrial fibrillation (HCC) 2017   [facility]    Patient Active Problem List   Diagnosis Date Noted   Occult GI bleeding 06/30/2022   Hypotension 06/27/2022   Weight loss 06/27/2022   Persistent atrial fibrillation (HCC)    COPD (chronic obstructive pulmonary disease) (HCC) 10/19/2021   Dementia (HCC) 10/16/2021   Overactive bladder 05/24/2021   Vaginal lesion 05/24/2021   History of TIA 10/30/2020   History of cerebral infarction 10/29/2020   History of right breast cancer 02/25/2018   History of tobacco use 05/06/2017   Mixed hyperlipidemia 05/06/2017   Osteopenia of multiple sites 05/06/2017   Essential hypertension 10/25/2015   PAF (paroxysmal atrial fibrillation) (HCC)  10/25/2015   Home Medication(s) Prior to Admission medications   Medication Sig Start Date End Date Taking? Authorizing Provider  acetaminophen (TYLENOL) 500 MG tablet Take 1 tablet (500 mg total) by mouth every 6 (six) hours as needed for moderate pain, fever or mild pain. 10/24/21  Yes Pokhrel, Laxman, MD  albuterol (VENTOLIN HFA) 108 (90 Base) MCG/ACT inhaler Inhale 2 puffs into the lungs every 4 (four) hours as needed for wheezing or shortness of breath.   Yes [provider]  apixaban (ELIQUIS) 5 MG TABS tablet TAKE 1 TABLET(5 MG) BY MOUTH TWICE DAILY Patient taking differently: Take 5 mg by mouth 2 (two) times daily. 02/28/22  Yes Newman Nip, NP  furosemide (LASIX) 20 MG tablet Take 2 tablets (40 mg total) by mouth daily. Patient taking differently: Take 20 mg by mouth at bedtime. 02/11/22  Yes Dickie La, MD  Melatonin 10 MG TABS Take 10 mg by mouth at bedtime.   Yes [provider]  memantine (NAMENDA) 10 MG tablet Take 1 tablet (10 mg total) by mouth 2 (two) times daily. 08/27/22 08/27/23 Yes Mercie Eon, MD  metoprolol succinate (TOPROL-XL) 50 MG 24 hr tablet Take 1 tablet (50 mg total) by mouth 2 (two) times daily. 02/27/22  Yes Newman Nip, NP  Multiple Vitamins-Minerals (MULTIVITAMIN WITH MINERALS) tablet Take 1 tablet by mouth daily with breakfast.   Yes [provider]  ondansetron (ZOFRAN-ODT) 4 MG disintegrating tablet Take 4 mg by mouth every 8 (eight) hours as needed for vomiting or nausea (dissolve orally). 06/29/22  Yes [provider]  potassium chloride (KLOR-CON M)  10 MEQ tablet TAKE 1 TABLET(10 MEQ) BY MOUTH TWICE DAILY Patient taking differently: Take 10 mEq by mouth 2 (two) times daily. 11/04/22  Yes Earl Lagos, MD  QUEtiapine (SEROQUEL) 50 MG tablet Take 50 mg by mouth in the morning and at bedtime.   Yes [provider]  spironolactone (ALDACTONE) 25 MG tablet Take 0.5 tablets (12.5 mg total) by mouth daily. 12/10/21  11/30/23 Yes Marjie Skiff E, PA-C  losartan (COZAAR) 50 MG tablet Take 0.5 tablets (25 mg total) by mouth daily. Patient not taking: Reported on 12/25/2022 11/28/21   Karoline Caldwell, MD  sertraline (ZOLOFT) 50 MG tablet TAKE 1 TABLET(50 MG) BY MOUTH DAILY Patient not taking: Reported on 12/25/2022 11/04/22   Earl Lagos, MD                                                                                                                                    Past Surgical History Past Surgical History:  Procedure Laterality Date   ABDOMINAL HYSTERECTOMY     CARDIOVERSION N/A 02/19/2022   Procedure: CARDIOVERSION;  Surgeon: Meriam Sprague, MD;  Location: [facility] ENDOSCOPY;  Service: Cardiovascular;  Laterality: N/A;   REVERSE SHOULDER ARTHROPLASTY Right 10/21/2021   Procedure: RIGHT REVERSE TOTAL SHOULDER ARTHROPLASTY;  Surgeon: Bjorn Pippin, MD;  Location: [facility] OR;  Service: Orthopedics;  Laterality: Right;  allen bed, spider, tornier (rep aware), open shoulder tray.   Family History Family History  Problem Relation Age of Onset   Arrhythmia Father     Social History Social History   Tobacco Use   Smoking status: Former    Current packs/day: 0.50    Types: Cigarettes  Vaping Use   Vaping status: Never Used  Substance Use Topics   Alcohol use: Not Currently    Alcohol/week: 6.0 standard drinks of alcohol    Types: 6 Cans of beer per week   Drug use: Not Currently   Allergies Influenza virus vaccine, Mirabegron, Myrbetriq [mirabegron er], Pneumococcal vaccine, and Ativan [lorazepam]  Review of Systems Review of Systems  All other systems reviewed and are negative.   Physical Exam Vital Signs  I have reviewed the triage vital signs BP 137/79 (BP Location: Left Arm)   Pulse 92   Temp 98.6 F (37 C) (Oral)   Resp 16   Ht 5\' 4"  (1.626 m)   Wt 52.2 kg   LMP  (LMP Unknown)   SpO2 97%   BMI 19.74 kg/m  Physical Exam Vitals and nursing note reviewed.  Constitutional:       General: She is not in acute distress.    Appearance: She is well-developed.  HENT:     Head: Normocephalic and atraumatic.     Mouth/Throat:     Mouth: Mucous membranes are moist.  Eyes:     Pupils: Pupils are equal, round, and reactive to light.  Cardiovascular:     Rate and Rhythm: Normal  rate and regular rhythm.     Heart sounds: No murmur heard. Pulmonary:     Effort: Pulmonary effort is normal. No respiratory distress.     Breath sounds: Normal breath sounds.  Abdominal:     General: Abdomen is flat.     Palpations: Abdomen is soft.     Tenderness: There is no abdominal tenderness.  Musculoskeletal:        General: No tenderness.     Right lower leg: No edema.     Left lower leg: No edema.  Skin:    General: Skin is warm and dry.  Neurological:     Mental Status: She is alert.     Comments: Cranial nerves II through XII intact, moving all 4 extremities equally.  Oriented to self, that she is in the hospital but does not know why she is in the hospital, does not know the year  Psychiatric:        Mood and Affect: Mood normal.        Behavior: Behavior normal.     ED Results and Treatments Labs (all labs ordered are listed, but only abnormal results are displayed) Labs Reviewed  CBC WITH DIFFERENTIAL/PLATELET - Abnormal; Notable for the following components:      Result Value   Hemoglobin 11.7 (*)    RDW 16.6 (*)    All other components within normal limits  URINALYSIS, ROUTINE W REFLEX MICROSCOPIC - Abnormal; Notable for the following components:   Color, Urine STRAW (*)    Hgb urine dipstick SMALL (*)    Leukocytes,Ua MODERATE (*)    Bacteria, UA RARE (*)    All other components within normal limits  COMPREHENSIVE METABOLIC PANEL  ETHANOL  RAPID URINE DRUG SCREEN, HOSP PERFORMED                                                                                                                          Radiology CT Head Wo Contrast  Result Date:  12/25/2022 CLINICAL DATA:  Headache. EXAM: CT HEAD WITHOUT CONTRAST TECHNIQUE: Contiguous axial images were obtained from the base of the skull through the vertex without intravenous contrast. RADIATION DOSE REDUCTION: This exam was performed according to the departmental dose-optimization program which includes automated exposure control, adjustment of the mA and/or kV according to patient size and/or use of iterative reconstruction technique. COMPARISON:  November 24, 2022 FINDINGS: Brain: There is mild cerebral atrophy with widening of the extra-axial spaces and ventricular dilatation. There are areas of decreased attenuation within the white matter tracts of the supratentorial brain, consistent with microvascular disease changes. Vascular: No hyperdense vessel or unexpected calcification. Skull: Normal. Negative for fracture or focal lesion. Sinuses/Orbits: No acute finding. Other: None. IMPRESSION: 1. Generalized cerebral atrophy with chronic white matter small vessel ischemic changes. 2. No acute intracranial abnormality. Electronically Signed   By: Aram Candela M.D.   On: 12/25/2022 16:08    Pertinent labs & imaging results that were available during  my care of the patient were reviewed by me and considered in my medical decision making (see MDM for details).  Medications Ordered in ED Medications  acetaminophen (TYLENOL) tablet 1,000 mg (has no administration in time range)  fosfomycin (MONUROL) packet 3 g (has no administration in time range)                                                                                                                                     Procedures Procedures  (including critical care time)  Medical Decision Making / ED Course   MDM:  77 year old female presenting to the emergency department with aggressive behavior at home.  Patient well-appearing, physical exam without any abnormality.  Patient is pleasant in the emergency department.  Symptoms  seem most likely due to her underlying dementia but will check laboratory testing, urinalysis, CT head to evaluate for other causes of behavior change such as electrolyte derangement, occult infectious process such as UTI, intracranial process such as intracranial bleeding or tumor.  Symptoms seem most likely due to dementia but will have TOC evaluate whether patient would benefit from [facility].  If negative patient will likely need to go home with family members so that they can pursue placement  Clinical Course as of 12/25/22 1812  Thu Dec 25, 2022  1191 Medical workup is unremarkable.  Attempted to call daughter but she did not answer her phone.  Suspect most likely cause of behavioral dysregulation is her chronic dementia.  Does have some borderline evidence of UTI on her urinalysis so we will give a dose of fosfomycin.  Consulted psychiatry. [WS]    Clinical Course User Index [WS] Lonell Grandchild, MD     Additional history obtained: -Additional history obtained from ems -External records from outside source obtained and reviewed including: Chart review including previous notes, labs, imaging, consultation notes including previous ER visits for similar   Lab Tests: -I ordered, reviewed, and interpreted labs.   The pertinent results include:   Labs Reviewed  CBC WITH DIFFERENTIAL/PLATELET - Abnormal; Notable for the following components:      Result Value   Hemoglobin 11.7 (*)    RDW 16.6 (*)    All other components within normal limits  URINALYSIS, ROUTINE W REFLEX MICROSCOPIC - Abnormal; Notable for the following components:   Color, Urine STRAW (*)    Hgb urine dipstick SMALL (*)    Leukocytes,Ua MODERATE (*)    Bacteria, UA RARE (*)    All other components within normal limits  COMPREHENSIVE METABOLIC PANEL  ETHANOL  RAPID URINE DRUG SCREEN, HOSP PERFORMED    Notable for possible UTI  EKG   EKG Interpretation Date/Time:  Thursday December 25 2022 16:39:27  EDT Ventricular Rate:  100 PR Interval:    QRS Duration:  90 QT Interval:  342 QTC Calculation: 441 R Axis:   75  Text Interpretation: Atrial fibrillation Abnormal ECG When  compared with ECG of 24-Nov-2022 16:36, No significant change since last tracing Confirmed by Alvino Blood ([id number]) on 12/25/2022 5:03:27 PM         Imaging Studies ordered: I ordered imaging studies including CT head On my interpretation imaging demonstrates no acute process I independently visualized and interpreted imaging. I agree with the radiologist interpretation   Medicines ordered and prescription drug management: Meds ordered this encounter  Medications   acetaminophen (TYLENOL) tablet 1,000 mg   fosfomycin (MONUROL) packet 3 g    -I have reviewed the patients home medicines and have made adjustments as needed    Cardiac Monitoring: The patient was maintained on a cardiac monitor.  I personally viewed and interpreted the cardiac monitored which showed an underlying rhythm of: NSR  Reevaluation: After the interventions noted above, I reevaluated the patient and found that their symptoms have improved  Co morbidities that complicate the patient evaluation  Past Medical History:  Diagnosis Date   Anxiety    Breast cancer (HCC)    Chronic combined systolic and diastolic CHF (congestive heart failure) (HCC)    a. Echo 11/2021: LVEF of 40-45% with global hypokinesis, mildly reduced RV systolic function, and moderate MR   COPD (chronic obstructive pulmonary disease) (HCC)    Dementia (HCC)    Depression    HTN (hypertension)    Paroxysmal atrial fibrillation (HCC) 2017   Stroke [facility])       Dispostion: Disposition decision including need for hospitalization was considered, and patient pending psychiatry evaluation    Final Clinical Impression(s) / ED Diagnoses Final diagnoses:  Dementia with behavioral disturbance (HCC)     This chart was dictated using voice recognition  software.  Despite best efforts to proofread,  errors can occur which can change the documentation meaning.    Lonell Grandchild, MD 12/25/22 [phone]

## 2022-12-25 NOTE — ED Notes (Addendum)
Mrs Cimaglia is pacing in her room, frequently attempting to leave her room and push past staff. She remains focused on her back pain and advil was given as ordered. She is currently showing signs of sundowning as evidenced by her very poor short term memory and frequent use of profanity toward staff. Requires frequent redirection to stay in her room. Earlier in shift she was having pleasant interactions and conversations with staff which has deteriorate to profanity and threats to staff. "This is BULLSH-- give me my F--kin clothes so I can go home".
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
## 2022-12-25 NOTE — ED Notes (Signed)
Pt given sandwich and drink.

## 2022-12-25 NOTE — ED Notes (Signed)
Belongings collected and placed in 5-8 cabinet.

## 2022-12-25 NOTE — ED Notes (Signed)
Urine sent
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
## 2022-12-25 NOTE — ED Notes (Signed)
Medicated for agitation per order pt remains border line manic and easily angered. No assualtive behavior toward staff but pt can be verbally threatening at times. Zyprexia 5 mg IM given.

## 2022-12-26 DIAGNOSIS — F03918 Unspecified dementia, unspecified severity, with other behavioral disturbance: Secondary | ICD-10-CM | POA: Diagnosis not present

## 2022-12-26 MED ORDER — QUETIAPINE FUMARATE 50 MG PO TABS
50.0000 mg | ORAL_TABLET | Freq: Two times a day (BID) | ORAL | Status: DC
  Administered 2022-12-26 – 2022-12-29 (×6): 50 mg via ORAL
  Filled 2022-12-26 (×6): qty 1

## 2022-12-26 MED ORDER — METOPROLOL SUCCINATE ER 50 MG PO TB24
50.0000 mg | ORAL_TABLET | Freq: Two times a day (BID) | ORAL | Status: DC
  Administered 2022-12-26 – 2022-12-29 (×6): 50 mg via ORAL
  Filled 2022-12-26 (×7): qty 1

## 2022-12-26 MED ORDER — MELATONIN 3 MG PO TABS
3.0000 mg | ORAL_TABLET | Freq: Every day | ORAL | Status: DC
  Administered 2022-12-26 – 2022-12-27 (×2): 3 mg via ORAL
  Filled 2022-12-26 (×2): qty 1

## 2022-12-26 MED ORDER — ACETAMINOPHEN 325 MG PO TABS
650.0000 mg | ORAL_TABLET | Freq: Once | ORAL | Status: AC
  Administered 2022-12-26: 650 mg via ORAL
  Filled 2022-12-26: qty 2

## 2022-12-26 MED ORDER — ALBUTEROL SULFATE HFA 108 (90 BASE) MCG/ACT IN AERS: 2.0000 | INHALATION_SPRAY | RESPIRATORY_TRACT | Status: DC | PRN

## 2022-12-26 MED ORDER — APIXABAN 5 MG PO TABS
5.0000 mg | ORAL_TABLET | Freq: Two times a day (BID) | ORAL | Status: DC
  Administered 2022-12-26 – 2022-12-29 (×7): 5 mg via ORAL
  Filled 2022-12-26 (×9): qty 1

## 2022-12-26 MED ORDER — ADULT MULTIVITAMIN W/MINERALS CH
1.0000 | ORAL_TABLET | Freq: Every day | ORAL | Status: DC
  Administered 2022-12-27 – 2022-12-29 (×3): 1 via ORAL
  Filled 2022-12-26 (×3): qty 1

## 2022-12-26 MED ORDER — MULTI-VITAMIN/MINERALS PO TABS: 1.0000 | ORAL_TABLET | Freq: Every day | ORAL | Status: DC

## 2022-12-26 MED ORDER — SPIRONOLACTONE 12.5 MG HALF TABLET
12.5000 mg | ORAL_TABLET | Freq: Every day | ORAL | Status: DC
  Administered 2022-12-26 – 2022-12-29 (×4): 12.5 mg via ORAL
  Filled 2022-12-26 (×4): qty 1

## 2022-12-26 MED ORDER — POTASSIUM CHLORIDE CRYS ER 10 MEQ PO TBCR
10.0000 meq | EXTENDED_RELEASE_TABLET | Freq: Two times a day (BID) | ORAL | Status: DC
  Administered 2022-12-26 – 2022-12-29 (×7): 10 meq via ORAL
  Filled 2022-12-26 (×7): qty 1

## 2022-12-26 MED ORDER — MELATONIN 10 MG PO TABS: 10.0000 mg | ORAL_TABLET | Freq: Every day | ORAL | Status: DC

## 2022-12-26 MED ORDER — QUETIAPINE FUMARATE 50 MG PO TABS
50.0000 mg | ORAL_TABLET | Freq: Two times a day (BID) | ORAL | Status: DC
  Administered 2022-12-26: 50 mg via ORAL
  Filled 2022-12-26: qty 1

## 2022-12-26 MED ORDER — OLANZAPINE 5 MG PO TBDP: 5.0000 mg | ORAL_TABLET | Freq: Every day | ORAL | Status: DC

## 2022-12-26 MED ORDER — FUROSEMIDE 20 MG PO TABS
20.0000 mg | ORAL_TABLET | Freq: Every day | ORAL | Status: DC
  Administered 2022-12-26 – 2022-12-28 (×3): 20 mg via ORAL
  Filled 2022-12-26 (×3): qty 1

## 2022-12-26 MED ORDER — DIVALPROEX SODIUM 125 MG PO CSDR
125.0000 mg | DELAYED_RELEASE_CAPSULE | Freq: Three times a day (TID) | ORAL | Status: DC
  Administered 2022-12-26 – 2022-12-29 (×9): 125 mg via ORAL
  Filled 2022-12-26 (×9): qty 1

## 2022-12-26 MED ORDER — ALBUTEROL SULFATE (2.5 MG/3ML) 0.083% IN NEBU
2.5000 mg | INHALATION_SOLUTION | RESPIRATORY_TRACT | Status: DC | PRN
  Filled 2022-12-26: qty 3

## 2022-12-26 NOTE — Progress Notes (Signed)
LCSW Progress Note  [id number]   Marisa Nunez  12/26/2022  1:07 AM    Inpatient Behavioral Health Placement  Pt meets inpatient criteria per Chinwendu Onuoha,NP. There are no available beds within [facility] system per [facility] [employer]. Referral was sent to the following facilities;   Destination  Service Provider Address Phone [facility]  [address] [place], [place] [address] [phone]  [facility]  [address], [place] [address] [phone]  [facility]  [facility], [place] [phone]  [facility]  [address] [place] [address] [phone]  [facility]  [address], [place] [address] [phone]  [facility]  [address] [place], [place] [address] [phone]  [facility]  [address] [place] [address] [phone]  [facility]  [address] [place], [place] [address] [phone]  [facility]  [address] [place] [address] [phone]  [facility]  [address], [place] [address] [phone]  [facility]  [address] [place] [address] [phone]  [facility]  [address] [place] [address] [phone]  [facility]  [address] [place] [address] [phone]  [facility]  [place] [phone]  [facility]  [address] [place] [address] [phone]  [facility] EFAX  [address]  Karolee Ohs [place] [phone]  [facility]  [address], [place] [address] [phone]  [facility]  [address] [place] [address] [phone]  [facility]  [address] [place] [address] [phone]  [facility]  [address] [place] [address] [phone]  [facility]  [address], [place] [address] [phone]  [facility]  [address] [place] [address] [phone]  [facility]  [place] [phone]  [facility]  [address] [place] [address] [phone]  [facility]  [address] [place], [place] [address] [phone]  [facility]  [address] [place] [address] [phone]  [facility] [address]  [place] [address] [phone]  [facility]  [address] [place] [address] [phone]  [facility]  [address] [place] [address] [phone]    Situation ongoing,  CSW will follow up.    Maryjean Ka, MSW, LCSWA 12/26/2022 1:07 AM
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
## 2022-12-26 NOTE — ED Provider Notes (Signed)
Emergency Medicine Observation Re-evaluation Note  Marisa Nunez is a 77 y.o. female, seen on rounds today.  Pt initially presented to the ED for complaints of Aggressive Behavior Currently, the patient is awaiting psychiatric bed.  Physical Exam  BP (!) 154/98 (BP Location: Left Arm)   Pulse 89   Temp (!) 97.5 F (36.4 C) (Oral)   Resp 18   Ht 5\' 4"  (1.626 m)   Wt 52.2 kg   LMP  (LMP Unknown)   SpO2 100%   BMI 19.74 kg/m  Physical Exam Alert and in no acute distress  ED Course / MDM  EKG:EKG Interpretation Date/Time:  Thursday December 25 2022 16:39:27 EDT Ventricular Rate:  100 PR Interval:    QRS Duration:  90 QT Interval:  342 QTC Calculation: 441 R Axis:   75  Text Interpretation: Atrial fibrillation Abnormal ECG When compared with ECG of 24-Nov-2022 16:36, No significant change since last tracing Confirmed by Alvino Blood ([id number]) on 12/25/2022 5:03:27 PM  I have reviewed the labs performed to date as well as medications administered while in observation.  Recent changes in the last 24 hours include none.  Plan  Current plan is for psychiatric admission.    Bethann Berkshire, MD 12/26/22 [phone]

## 2022-12-26 NOTE — ED Notes (Signed)
Pt c/o SOB respiratory called to assess pt

## 2022-12-27 MED ORDER — ACETAMINOPHEN 500 MG PO TABS
1000.0000 mg | ORAL_TABLET | Freq: Four times a day (QID) | ORAL | Status: DC | PRN
  Administered 2022-12-27 – 2022-12-28 (×4): 1000 mg via ORAL
  Filled 2022-12-27 (×4): qty 2

## 2022-12-27 NOTE — Progress Notes (Signed)
12/27/2022  0940  Notified dr Rodena Medin of patients BP 89/57 with recheck 84/53.

## 2022-12-27 NOTE — Progress Notes (Signed)
[facility] Psych ED Progress Note  12/27/2022 11:59 AM Marisa Nunez  MRN:  [id number]   Subjective: Female, 24 seen today for reevaluation was brought in by [facility] for agitation, aggression. Per triage note she grabbed a scissors and threatened to stab herself and her family. This morning she was seen awake, alert and calm.  Per Nursing reports she has been calm and compliant with her medications.  Patient engaged in meaningful conversation with provider.  She admitted that she is having Memory issues and cannot remember much.  Patient does not know where she is and why she is in the hospital ER.  She reports good appetite and stated " I slept like a baby last night"  Patient is unable to states where she was staying before coming to the hospital.  Night shift staff reported that she experience "sun Joycelyn Rua " last night where she became confused, irritable and used profanity towards staff. She forgot she was in the hospital and wanted her belongs given to her to go home with.   We will continue to evaluate daily and continue to make Medication adjustment.  Plan  is to send her home to her family once she is stabilized.  She denied SI/HI/AVH. Principal Problem: Dementia with behavioral disturbance (HCC) Diagnosis:  Principal Problem:   Dementia with behavioral disturbance [facility])   ED Assessment Time Calculation: Start Time: 1117 Stop Time: 1143 Total Time in Minutes (Assessment Completion): 26   Past Psychiatric History: Dementia.  [place] Scale:  Flowsheet Row ED from 12/25/2022 in [facility] Emergency Department at [facility] ED from 11/24/2022 in [facility] Emergency Department at [facility] ED to Hosp-Admission (Discharged) from 06/30/2022 in [facility] EAST MEDICAL UNIT  C-SSRS RISK CATEGORY No Risk No Risk No Risk       Past Medical History:  Past Medical History:  Diagnosis Date   Anxiety    Breast cancer (HCC)    Chronic combined systolic and  diastolic CHF (congestive heart failure) (HCC)    a. Echo 11/2021: LVEF of 40-45% with global hypokinesis, mildly reduced RV systolic function, and moderate MR   COPD (chronic obstructive pulmonary disease) (HCC)    Dementia (HCC)    Depression    HTN (hypertension)    Paroxysmal atrial fibrillation (HCC) 2017   Stroke [facility])     Past Surgical History:  Procedure Laterality Date   ABDOMINAL HYSTERECTOMY     CARDIOVERSION N/A 02/19/2022   Procedure: CARDIOVERSION;  Surgeon: Meriam Sprague, MD;  Location: [facility] ENDOSCOPY;  Service: Cardiovascular;  Laterality: N/A;   REVERSE SHOULDER ARTHROPLASTY Right 10/21/2021   Procedure: RIGHT REVERSE TOTAL SHOULDER ARTHROPLASTY;  Surgeon: Bjorn Pippin, MD;  Location: [facility] OR;  Service: Orthopedics;  Laterality: Right;  allen bed, spider, tornier (rep aware), open shoulder tray.   Family History:  Family History  Problem Relation Age of Onset   Arrhythmia Father    Family Psychiatric  History: unknown Social History:  Social History   Substance and Sexual Activity  Alcohol Use Not Currently   Alcohol/week: 6.0 standard drinks of alcohol   Types: 6 Cans of beer per week     Social History   Substance and Sexual Activity  Drug Use Not Currently    Social History   Socioeconomic History   Marital status: Single    Spouse name: Not on file   Number of children: Not on file   Years of education: Not on file  Highest education level: Not on file  Occupational History   Not on file  Tobacco Use   Smoking status: Former    Current packs/day: 0.50    Types: Cigarettes   Smokeless tobacco: Not on file  Vaping Use   Vaping status: Never Used  Substance and Sexual Activity   Alcohol use: Not Currently    Alcohol/week: 6.0 standard drinks of alcohol    Types: 6 Cans of beer per week   Drug use: Not Currently   Sexual activity: Yes  Other Topics Concern   Not on file  Social History Narrative   Not on file   Social Determinants of  Health   Financial Resource Strain: Low Risk  (06/26/2022)   Overall Financial Resource Strain (CARDIA)    Difficulty of Paying Living Expenses: Not hard at all  Food Insecurity: No Food Insecurity (06/30/2022)   Hunger Vital Sign    Worried About Running Out of Food in the Last Year: Never true    Ran Out of Food in the Last Year: Never true  Transportation Needs: No Transportation Needs (06/30/2022)   PRAPARE - Administrator, Civil Service (Medical): No    Lack of Transportation (Non-Medical): No  Physical Activity: Sufficiently Active (06/26/2022)   Exercise Vital Sign    Days of Exercise per Week: 7 days    Minutes of Exercise per Session: 50 min  Stress: No Stress Concern Present (06/26/2022)   [employer] of Occupational Health - Occupational Stress Questionnaire    Feeling of Stress : Not at all  Social Connections: Socially Isolated (06/26/2022)   Social Connection and Isolation Panel [NHANES]    Frequency of Communication with Friends and Family: More than three times a week    Frequency of Social Gatherings with Friends and Family: More than three times a week    Attends Religious Services: Never    Database administrator or Organizations: No    Attends Engineer, structural: Never    Marital Status: Divorced    Sleep: Good  Appetite:  Good  Current Medications: Current Facility-Administered Medications  Medication Dose Route Frequency Provider Last Rate Last Admin   acetaminophen (TYLENOL) tablet 1,000 mg  1,000 mg Oral Q6H PRN Wynetta Fines, MD   1,000 mg at 12/27/22 1030   albuterol (PROVENTIL) (2.5 MG/3ML) 0.083% nebulizer solution 2.5 mg  2.5 mg Nebulization Q4H PRN Bethann Berkshire, MD       apixaban Everlene Balls) tablet 5 mg  5 mg Oral BID Bethann Berkshire, MD   5 mg at 12/27/22 4010   divalproex (DEPAKOTE SPRINKLE) capsule 125 mg  125 mg Oral TID Motley-Mangrum, Jadeka A, PMHNP   125 mg at 12/27/22 2725   furosemide (LASIX) tablet 20 mg  20 mg  Oral QHS Bethann Berkshire, MD   20 mg at 12/26/22 2126   melatonin tablet 3 mg  3 mg Oral QHS Bethann Berkshire, MD   3 mg at 12/26/22 2126   metoprolol succinate (TOPROL-XL) 24 hr tablet 50 mg  50 mg Oral BID Bethann Berkshire, MD   50 mg at 12/26/22 2125   multivitamin with minerals tablet 1 tablet  1 tablet Oral Daily Bethann Berkshire, MD   1 tablet at 12/27/22 3664   potassium chloride (KLOR-CON M) CR tablet 10 mEq  10 mEq Oral BID Bethann Berkshire, MD   10 mEq at 12/27/22 0928   QUEtiapine (SEROQUEL) tablet 50 mg  50 mg Oral BID Motley-Mangrum, Jadeka A,  PMHNP   50 mg at 12/27/22 1610   spironolactone (ALDACTONE) tablet 12.5 mg  12.5 mg Oral Daily Bethann Berkshire, MD   12.5 mg at 12/27/22 9604   Current Outpatient Medications  Medication Sig Dispense Refill   acetaminophen (TYLENOL) 500 MG tablet Take 1 tablet (500 mg total) by mouth every 6 (six) hours as needed for moderate pain, fever or mild pain. 30 tablet 0   albuterol (VENTOLIN HFA) 108 (90 Base) MCG/ACT inhaler Inhale 2 puffs into the lungs every 4 (four) hours as needed for wheezing or shortness of breath.     apixaban (ELIQUIS) 5 MG TABS tablet TAKE 1 TABLET(5 MG) BY MOUTH TWICE DAILY (Patient taking differently: Take 5 mg by mouth 2 (two) times daily.) 42 tablet 0   furosemide (LASIX) 20 MG tablet Take 2 tablets (40 mg total) by mouth daily. (Patient taking differently: Take 20 mg by mouth at bedtime.) 90 tablet 3   Melatonin 10 MG TABS Take 10 mg by mouth at bedtime.     memantine (NAMENDA) 10 MG tablet Take 1 tablet (10 mg total) by mouth 2 (two) times daily. 180 tablet 3   metoprolol succinate (TOPROL-XL) 50 MG 24 hr tablet Take 1 tablet (50 mg total) by mouth 2 (two) times daily. 60 tablet 3   Multiple Vitamins-Minerals (MULTIVITAMIN WITH MINERALS) tablet Take 1 tablet by mouth daily with breakfast.     ondansetron (ZOFRAN-ODT) 4 MG disintegrating tablet Take 4 mg by mouth every 8 (eight) hours as needed for vomiting or nausea (dissolve  orally).     potassium chloride (KLOR-CON M) 10 MEQ tablet TAKE 1 TABLET(10 MEQ) BY MOUTH TWICE DAILY (Patient taking differently: Take 10 mEq by mouth 2 (two) times daily.) 180 tablet 0   QUEtiapine (SEROQUEL) 50 MG tablet Take 50 mg by mouth in the morning and at bedtime.     spironolactone (ALDACTONE) 25 MG tablet Take 0.5 tablets (12.5 mg total) by mouth daily. 90 tablet 3   losartan (COZAAR) 50 MG tablet Take 0.5 tablets (25 mg total) by mouth daily. (Patient not taking: Reported on 12/25/2022) 90 tablet 3   sertraline (ZOLOFT) 50 MG tablet TAKE 1 TABLET(50 MG) BY MOUTH DAILY (Patient not taking: Reported on 12/25/2022) 90 tablet 0    Lab Results:  Results for orders placed or performed during the hospital encounter of 12/25/22 (from the past 48 hour(s))  Urine rapid drug screen (hosp performed)     Status: None   Collection Time: 12/25/22  3:48 PM  Result Value Ref Range   Opiates NONE DETECTED NONE DETECTED   Cocaine NONE DETECTED NONE DETECTED   Benzodiazepines NONE DETECTED NONE DETECTED   Amphetamines NONE DETECTED NONE DETECTED   Tetrahydrocannabinol NONE DETECTED NONE DETECTED   Barbiturates NONE DETECTED NONE DETECTED    Comment: (NOTE) DRUG SCREEN FOR MEDICAL PURPOSES ONLY.  IF CONFIRMATION IS NEEDED FOR ANY PURPOSE, NOTIFY LAB WITHIN 5 DAYS.  LOWEST DETECTABLE LIMITS FOR URINE DRUG SCREEN Drug Class                     Cutoff (ng/mL) Amphetamine and metabolites    1000 Barbiturate and metabolites    200 Benzodiazepine                 200 Opiates and metabolites        300 Cocaine and metabolites        300 THC  50 Performed at [facility], [address], [place], [place] [address]   Urinalysis, Routine w reflex microscopic -Urine, Clean Catch     Status: Abnormal   Collection Time: 12/25/22  3:48 PM  Result Value Ref Range   Color, Urine STRAW (A) YELLOW   APPearance CLEAR CLEAR   Specific Gravity, Urine 1.005 1.005  - 1.030   pH 7.0 5.0 - 8.0   Glucose, UA NEGATIVE NEGATIVE mg/dL   Hgb urine dipstick SMALL (A) NEGATIVE   Bilirubin Urine NEGATIVE NEGATIVE   Ketones, ur NEGATIVE NEGATIVE mg/dL   Protein, ur NEGATIVE NEGATIVE mg/dL   Nitrite NEGATIVE NEGATIVE   Leukocytes,Ua MODERATE (A) NEGATIVE   RBC / HPF 0-5 0 - 5 RBC/hpf   WBC, UA 11-20 0 - 5 WBC/hpf   Bacteria, UA RARE (A) NONE SEEN   Squamous Epithelial / HPF 0-5 0 - 5 /HPF    Comment: Performed at [facility], [address] [place], [place] [address]  Comprehensive metabolic panel     Status: None   Collection Time: 12/25/22  4:40 PM  Result Value Ref Range   Sodium 138 135 - 145 mmol/L   Potassium 4.2 3.5 - 5.1 mmol/L   Chloride 99 98 - 111 mmol/L   CO2 29 22 - 32 mmol/L   Glucose, Bld 92 70 - 99 mg/dL    Comment: Glucose reference range applies only to samples taken after fasting for at least 8 hours.   BUN 16 8 - 23 mg/dL   Creatinine, Ser 9.62 0.44 - 1.00 mg/dL   Calcium 9.5 8.9 - 95.2 mg/dL   Total Protein 7.3 6.5 - 8.1 g/dL   Albumin 3.8 3.5 - 5.0 g/dL   AST 18 15 - 41 U/L   ALT 14 0 - 44 U/L   Alkaline Phosphatase 116 38 - 126 U/L   Total Bilirubin 0.3 0.3 - 1.2 mg/dL   GFR, Estimated >84 >13 mL/min    Comment: (NOTE) Calculated using the CKD-EPI Creatinine Equation (2021)    Anion gap 10 5 - 15    Comment: Performed at [facility], [address] [place], [place] [address]  CBC with Diff     Status: Abnormal   Collection Time: 12/25/22  4:40 PM  Result Value Ref Range   WBC 9.3 4.0 - 10.5 K/uL   RBC 4.39 3.87 - 5.11 MIL/uL   Hemoglobin 11.7 (L) 12.0 - 15.0 g/dL   HCT 02.7 25.3 - 66.4 %   MCV 85.4 80.0 - 100.0 fL   MCH 26.7 26.0 - 34.0 pg   MCHC 31.2 30.0 - 36.0 g/dL   RDW 40.3 (H) 47.4 - 25.9 %   Platelets 359 150 - 400 K/uL   nRBC 0.0 0.0 - 0.2 %   Neutrophils Relative % 73 %   Neutro Abs 6.9 1.7 - 7.7 K/uL   Lymphocytes Relative 15 %   Lymphs Abs 1.4 0.7 - 4.0 K/uL    Monocytes Relative 9 %   Monocytes Absolute 0.8 0.1 - 1.0 K/uL   Eosinophils Relative 2 %   Eosinophils Absolute 0.2 0.0 - 0.5 K/uL   Basophils Relative 1 %   Basophils Absolute 0.1 0.0 - 0.1 K/uL   Immature Granulocytes 0 %   Abs Immature Granulocytes 0.03 0.00 - 0.07 K/uL    Comment: Performed at [facility], [address] [place], [place] [address]  Ethanol     Status: None  Collection Time: 12/25/22  4:42 PM  Result Value Ref Range   Alcohol, Ethyl (B) <10 <10 mg/dL    Comment: (NOTE) Lowest detectable limit for serum alcohol is 10 mg/dL.  For medical purposes only. Performed at [facility], [address], [place], [place] [address]     Blood Alcohol level:  Lab Results  Component Value Date   [facility] <10 12/25/2022   ETH <10 06/14/2022    Physical Findings:  CIWA:    COWS:     Musculoskeletal: Strength & Muscle Tone: within normal limits Gait & Station: normal Patient leans: Front  Psychiatric Specialty Exam:  Presentation  General Appearance:  Appropriate for Environment; Neat  Eye Contact: Good  Speech: Clear and Coherent; Normal Rate  Speech Volume: Normal  Handedness: Right   Mood and Affect  Mood: Euthymic  Affect: Appropriate   Thought Process  Thought Processes: Coherent  Descriptions of Associations:Intact  Orientation:Partial  Thought Content:Scattered  History of Schizophrenia/Schizoaffective disorder:No  Duration of Psychotic Symptoms:No data recorded Hallucinations:Hallucinations: None  Ideas of Reference:None  Suicidal Thoughts:Suicidal Thoughts: No  Homicidal Thoughts:Homicidal Thoughts: No   Sensorium  Memory: Immediate Good; Recent Fair  Judgment: Impaired  Insight: Fair   Art therapist  Concentration: Good  Attention Span: Good  Recall: [employer] of Knowledge: Fair  Language: Fair   Psychomotor Activity  Psychomotor  Activity: Psychomotor Activity: Normal   Assets  Assets: Communication Skills; Social Support; Housing   Sleep  Sleep: Sleep: Good    Physical Exam: Physical Exam Vitals and nursing note reviewed.  Constitutional:      Appearance: Normal appearance.  HENT:     Head: Normocephalic.     Nose: Nose normal.  Cardiovascular:     Rate and Rhythm: Normal rate and regular rhythm.  Pulmonary:     Effort: Pulmonary effort is normal.  Musculoskeletal:        General: Normal range of motion.     Cervical back: Normal range of motion.  Skin:    General: Skin is dry.  Neurological:     Mental Status: She is alert.     Comments: Oriented to name.    ROS-Unable to assess, patient is non contributory due to Memory impairment. Blood pressure 91/63, pulse 75, temperature 97.8 F (36.6 C), temperature source Oral, resp. rate 20, height 5\' 4"  (1.626 m), weight 52.2 kg, SpO2 96%. Body mass index is 19.74 kg/m.   Medical Decision Making: We will continue to monitor patient and make Medication changes. Meanwhile she is taking Seroquel twice a day and Depakote three times a day.  We will obtain Depakote Level on Monday.  Plan is to send back home by Monday if she continues to improve.  Continue to monitor and reevaluate every morning.  Earney Navy, NP-PMHNP-BC 12/27/2022, 11:59 AM

## 2022-12-27 NOTE — ED Provider Notes (Signed)
Emergency Medicine Observation Re-evaluation Note  Marisa Nunez is a 77 y.o. female, seen on rounds today.  Pt initially presented to the ED for complaints of Aggressive Behavior Currently, the patient is resting.  Physical Exam  BP 114/85 (BP Location: Left Arm)   Pulse 79   Temp 97.8 F (36.6 C) (Oral)   Resp 18   Ht 5\' 4"  (1.626 m)   Wt 52.2 kg   LMP  (LMP Unknown)   SpO2 99%   BMI 19.74 kg/m  Physical Exam General: NAD   ED Course / MDM  EKG:EKG Interpretation Date/Time:  Thursday December 25 2022 16:39:27 EDT Ventricular Rate:  100 PR Interval:    QRS Duration:  90 QT Interval:  342 QTC Calculation: 441 R Axis:   75  Text Interpretation: Atrial fibrillation Abnormal ECG When compared with ECG of 24-Nov-2022 16:36, No significant change since last tracing Confirmed by Alvino Blood ([id number]) on 12/25/2022 5:03:27 PM  I have reviewed the labs performed to date as well as medications administered while in observation.  Recent changes in the last 24 hours include no acute events reported.  Plan  Current plan is for psych re-evaluation.    Wynetta Fines, MD 12/27/22 [phone]

## 2022-12-27 NOTE — ED Notes (Signed)
Mrs. Williston remains asleep with no signs of distress or discomfort. Respirations appear easy as noted by the rise and fall of pt chest.

## 2022-12-28 MED ORDER — MELATONIN 5 MG PO TABS
5.0000 mg | ORAL_TABLET | Freq: Every day | ORAL | Status: DC
  Administered 2022-12-28: 5 mg via ORAL
  Filled 2022-12-28: qty 1

## 2022-12-28 MED ORDER — CEPHALEXIN 500 MG PO CAPS
500.0000 mg | ORAL_CAPSULE | Freq: Two times a day (BID) | ORAL | Status: DC
  Administered 2022-12-28 – 2022-12-29 (×2): 500 mg via ORAL
  Filled 2022-12-28 (×2): qty 1

## 2022-12-28 NOTE — ED Provider Notes (Signed)
Called to bedside to evaluate patient's right anterior shin.  She has some redness surrounding minor skin wound.  No overt abscess or purulent drainage. No fever, stable vitals. She is sitting on chair, conversant and does not appear to be in any distress.  Will start antibiotic   Coral Spikes, DO 12/28/22 1802
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
## 2022-12-28 NOTE — ED Provider Notes (Signed)
Emergency Medicine Observation Re-evaluation Note  Marisa Nunez is a 77 y.o. female, seen on rounds today.  Pt initially presented to the ED for complaints of Aggressive Behavior Currently, the patient is awaiting placement, awake, alert.  Physical Exam  BP 104/60 (BP Location: Left Arm)   Pulse 72   Temp (!) 97.4 F (36.3 C) (Oral)   Resp 20   Ht 5\' 4"  (1.626 m)   Wt 52.2 kg   LMP  (LMP Unknown)   SpO2 91%   BMI 19.74 kg/m  Physical Exam General: NAD Cardiac: RR Lungs: even unlabored Psych: normal affect  ED Course / MDM  EKG:EKG Interpretation Date/Time:  Thursday December 25 2022 16:39:27 EDT Ventricular Rate:  100 PR Interval:    QRS Duration:  90 QT Interval:  342 QTC Calculation: 441 R Axis:   75  Text Interpretation: Atrial fibrillation Abnormal ECG When compared with ECG of 24-Nov-2022 16:36, No significant change since last tracing Confirmed by Alvino Blood ([id number]) on 12/25/2022 5:03:27 PM  I have reviewed the labs performed to date as well as medications administered while in observation.  Recent changes in the last 24 hours include none.  Plan  Current plan is for awaiting placement.  Has small wound to right shin, mild erythema, plan to start keflex.      Alvira Monday, MD 12/29/22 [phone]

## 2022-12-29 ENCOUNTER — Telehealth: Payer: Self-pay

## 2022-12-29 DIAGNOSIS — F03918 Unspecified dementia, unspecified severity, with other behavioral disturbance: Secondary | ICD-10-CM

## 2022-12-29 LAB — HEPATIC FUNCTION PANEL
ALT: 13 U/L (ref 0–44)
AST: 16 U/L (ref 15–41)
Albumin: 3.8 g/dL (ref 3.5–5.0)
Alkaline Phosphatase: 114 U/L (ref 38–126)
Bilirubin, Direct: <0.1 mg/dL (ref 0.0–0.2)
Total Bilirubin: 0.5 mg/dL (ref 0.3–1.2)
Total Protein: 7.2 g/dL (ref 6.5–8.1)

## 2022-12-29 LAB — VALPROIC ACID LEVEL: Valproic Acid Lvl: 16 ug/mL — ABNORMAL LOW (ref 50.0–100.0)

## 2022-12-29 MED ORDER — ALBUTEROL SULFATE (2.5 MG/3ML) 0.083% IN NEBU: 2.5000 mg | INHALATION_SOLUTION | RESPIRATORY_TRACT | 12 refills | Status: AC | PRN

## 2022-12-29 MED ORDER — CEPHALEXIN 500 MG PO CAPS: 500.0000 mg | ORAL_CAPSULE | Freq: Two times a day (BID) | ORAL | 0 refills | Status: AC

## 2022-12-29 MED ORDER — POTASSIUM CHLORIDE CRYS ER 10 MEQ PO TBCR: 10.0000 meq | EXTENDED_RELEASE_TABLET | Freq: Two times a day (BID) | ORAL | 0 refills | Status: DC

## 2022-12-29 MED ORDER — DIVALPROEX SODIUM 125 MG PO CSDR: 250.0000 mg | DELAYED_RELEASE_CAPSULE | Freq: Two times a day (BID) | ORAL | 0 refills | Status: DC

## 2022-12-29 MED ORDER — QUETIAPINE FUMARATE 50 MG PO TABS: 50.0000 mg | ORAL_TABLET | Freq: Two times a day (BID) | ORAL | 0 refills | Status: DC

## 2022-12-29 MED ORDER — METOPROLOL SUCCINATE ER 50 MG PO TB24: 50.0000 mg | ORAL_TABLET | Freq: Two times a day (BID) | ORAL | 0 refills | Status: DC

## 2022-12-29 NOTE — ED Notes (Signed)
IVC was rescinded by psych NP, paperwork taken to secretary by NP. Patient will not be discharged until IVC rescinded documentation is completed.

## 2022-12-29 NOTE — ED Notes (Signed)
Patient to be discharged. This nurse called   McCullagh,Stephanie (Daughter) [phone]  . Daughter requests nurse to contact husband Fredonia Highland. [phone]. This number called. Nurse educated Brett Canales patient will be discharged once TOC reaches out to family to review resources/referrals/information. Brett Canales begins speaking on patient finances and medicaid. Nurse educated to voice those concerns to Child psychotherapist for potential resource referrals. Nurse to notify Brett Canales once member is ready to leave [facility] ED , and was educated this will be today.
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
## 2022-12-29 NOTE — Telephone Encounter (Signed)
This RNCM called [facility] on [address] [phone] and reached voicemail message pharmacy closed today.  This RNCM called [facility] on [address] [phone] to advise patient's prescriptions were sent to the [facility] on [address] however that store is closed today. This RNCM requested all medications be transferred to [facility] on [address]. [place] at pharmacy reports medications should be ready for pick up within the next 90 mons.

## 2022-12-29 NOTE — Telephone Encounter (Signed)
This RNCM received a message from ED SW advising patient's family calling due to having difficulties with prescriptions. This RNCM called Mr.McCullagh who reports the pharmacy that medications were sent to [facility] on [address] is closed today and patient needs her medications. This RNCM advised will need to review chart and give a call back.    TOC following

## 2022-12-29 NOTE — ED Notes (Signed)
Pt c/o existing wound on right leg. Covered with band aid and informed RN. No change or new injury noted.

## 2022-12-29 NOTE — ED Notes (Signed)
Confirmed with secretary that IVC rescind document was received by Diplomatic Services operational officer.

## 2022-12-29 NOTE — Discharge Instructions (Addendum)
  1) Alzheimer's [employer] 24/7 Helpline (1.386-340-2791) General guidance on legal, financial, and care options LimitLaws.hu  2) Senior Resources at [employer] (856)756-3428) Case assistance, Meals on Wheels, Armed forces logistics/support/administrative officer www.senior-resources-[place].org  FEE BASED SERVICES  1) [facility] 404-546-9917) Senior Care Navigation Services, Legal/Financial Guidance Www.elderlawfirm.com  2) [employer] 210 453 8335) Senior Care Navigation Services, Legal/Financial Guidance Www.navigateseniorcare.com  Day Care 1) [employer] 318-508-9114) Half Day and Full Day Porgrams Www.well-springsolutions.com  Personal Caregiving 2) [facility] 8641433511) 20 hours/week up to 24/7 or live-in Private Pay or Long Term Care insurance accepted Www.[employer]mark[facility].com/128

## 2022-12-29 NOTE — Social Work (Signed)
CSW spoke to family about patient being cleared and DC. AT this time resources were added to the patients' AVS. CSW did explain all options for patient in the community. TOC will sign off.

## 2022-12-29 NOTE — ED Notes (Signed)
Patient took medications whole, without difficulty.

## 2022-12-29 NOTE — Telephone Encounter (Signed)
This RNCM notified patient's family medications have been transferred to the [facility] on [address].   Patient's family called again to advise the pharmacy is stating they only show 2 prescriptions. This RNCM called [facility] on [address] again who report they only show the metoprolol and cephalexin.   This RNCM spoke with [facility] on [address] who is unable to provide the Depakote sprinkles will need to know if DR or ER due to no sprinkles.   This RNCM notified EDP who reports patient will need Depakote sprinkles and recommend [facility] into [facility] on [address].  This RNCM made a verbal to [facility] [address]- Depakote 125 mg twice daily , #120 with no refills. Notified family that meds have been [facility] to [facility] on [address].  No additional TOC needs

## 2022-12-29 NOTE — ED Provider Notes (Signed)
  Physical Exam  BP 121/72 (BP Location: Left Arm)   Pulse 77   Temp (!) 97.5 F (36.4 C) (Oral)   Resp 18   Ht 5\' 4"  (1.626 m)   Wt 52.2 kg   LMP  (LMP Unknown)   SpO2 99%   BMI 19.74 kg/m   Physical Exam  Procedures  Procedures  ED Course / MDM   Clinical Course as of 12/29/22 1319  Thu Dec 25, 2022  1808 Medical workup is unremarkable.  Attempted to call daughter but she did not answer her phone.  Suspect most likely cause of behavioral dysregulation is her chronic dementia.  Does have some borderline evidence of UTI on her urinalysis so we will give a dose of fosfomycin.  Consulted psychiatry. [WS]    Clinical Course User Index [WS] Lonell Grandchild, MD   Medical Decision Making Amount and/or Complexity of Data Reviewed Labs: ordered. Radiology: ordered.  Risk OTC drugs. Prescription drug management.   Seen by psychiatry and cleared for discharge.  She put in discharge medication orders.  Wanted me to put up for discharge       Benjiman Core, MD 12/29/22 1320

## 2022-12-30 ENCOUNTER — Telehealth: Payer: Self-pay

## 2022-12-30 ENCOUNTER — Other Ambulatory Visit: Payer: Medicare Other | Admitting: Pharmacist

## 2022-12-30 NOTE — Progress Notes (Signed)
   Care Guide Note  12/30/2022 Name: Marisa Nunez MRN: [id number] DOB: Nov 20, 1945  Referred by: Mercie Eon, MD Reason for referral : Care Coordination (Outreach to schedule with Pharm d [facility] )   Marisa Nunez is a 77 y.o. year old female who is a primary care patient of Thella, Mcclaine, MD. Marisa Nunez was referred to the pharmacist for assistance related to HTN, HLD, and COPD.    Successful contact was made with the patient to discuss pharmacy services including being ready for the pharmacist to call at least 5 minutes before the scheduled appointment time, to have medication bottles and any blood sugar or blood pressure readings ready for review. The patient agreed to meet with the pharmacist via with the pharmacist via telephone visit on (date/time).  12/30/2022  Penne Lash, RMA Care Guide [facility]  [place], [place] [address] Direct Dial: [phone] [email or web address]

## 2022-12-30 NOTE — Progress Notes (Signed)
12/31/2022 Name: Marisa Nunez MRN: [id number] DOB: 1946/02/29   Marisa Nunez is a 77 y.o. year old female who presented for a telephone visit.   They were referred to the pharmacist by their Case Management Team  for assistance in managing diabetes and hyperlipidemia. Patient is cared for by her daughter, Celine Mans, and today we completed a review of medications to determine what patient is taking as well as any gaps in medication access. Below is a report from daughter regarding what medications from list she still needs:  Needs: -albuterol nebulizer  -> ready for pickup at pharmacy -keflex 500mg  Q12H - will pick up --> ready for pickup at pharmacy -spironolactone ->no refills, pharmacy is requesting from provider   Not taking: - apixaban for 2 weeks, need refill -> pharmacy is filling 12/31/22 morning, ready in 1 hour    Subjective:  Care Team: Primary Care Provider: Mercie Eon, MD   Medication Access/Adherence  Current Pharmacy:  [facility] #[id number] [place], [place] - [address] AT [facility] OF [facility] & [address] [place] [address] Phone: [phone] Fax: [phone]  Medication Management:  Current adherence strategy: daughter organizes medications, completing a review today after transition of care out of recent ER visit  Patient reports Fair adherence to medications  Patient reports the following barriers to adherence: confusion, changing medicines    Objective:  Lab Results  Component Value Date   HGBA1C 5.6 10/30/2020    Lab Results  Component Value Date   CREATININE 0.70 12/25/2022   BUN 16 12/25/2022   NA 138 12/25/2022   K 4.2 12/25/2022   CL 99 12/25/2022   CO2 29 12/25/2022    Lab Results  Component Value Date   CHOL 202 (H) 10/30/2020   HDL 88 10/30/2020   LDLCALC 100 (H) 10/30/2020   TRIG 70 10/30/2020   CHOLHDL 2.3 10/30/2020    Medications Reviewed Today     Reviewed by Gabriel Carina, RPH (Pharmacist) on 12/31/22 at 0932  Med List Status: <None>   Medication Order Taking? Sig Documenting Provider Last Dose Status Informant  acetaminophen (TYLENOL) 500 MG tablet [id number] Yes Take 1 tablet (500 mg total) by mouth every 6 (six) hours as needed for moderate pain, fever or mild pain. Pokhrel, Rebekah Chesterfield, MD Taking Active Family Member  albuterol (PROVENTIL) (2.5 MG/3ML) 0.083% nebulizer solution [id number] Yes Take 3 mLs (2.5 mg total) by nebulization every 4 (four) hours as needed for shortness of breath or wheezing. Earney Navy, NP Taking Active   apixaban (ELIQUIS) 5 MG TABS tablet [id number] No TAKE 1 TABLET(5 MG) BY MOUTH TWICE DAILY  Patient not taking: Reported on 12/30/2022   Newman Nip, NP Not Taking Active Family Member           Med Note Sedonia Small Jun 30, 2022  4:55 PM)    cephALEXin (KEFLEX) 500 MG capsule [id number] No Take 1 capsule (500 mg total) by mouth every 12 (twelve) hours for 5 days.  Patient not taking: Reported on 12/30/2022   Earney Navy, NP Not Taking Active   divalproex (DEPAKOTE SPRINKLE) 125 MG capsule [id number] Yes Take 2 capsules (250 mg total) by mouth 2 (two) times daily. Earney Navy, NP Taking Active   furosemide (LASIX) 20 MG tablet [id number] Yes Take 2 tablets (40 mg total) by mouth daily.  Patient taking differently: Take 20 mg by mouth at  bedtime.   Dickie La, MD Taking Active Family Member  Melatonin 10 MG TABS [id number] Yes Take 10 mg by mouth at bedtime. [provider] Taking Active Family Member  memantine (NAMENDA) 10 MG tablet [id number] Yes Take 1 tablet (10 mg total) by mouth 2 (two) times daily. Mercie Eon, MD Taking Active Family Member  metoprolol succinate (TOPROL-XL) 50 MG 24 hr tablet [id number] Yes Take 1 tablet (50 mg total) by mouth 2 (two) times daily. Take with or immediately following a meal. Earney Navy, NP Taking Active   Multiple Vitamins-Minerals  (MULTIVITAMIN WITH MINERALS) tablet [id number] Yes Take 1 tablet by mouth daily with breakfast. [provider] Taking Active Family Member  ondansetron (ZOFRAN-ODT) 4 MG disintegrating tablet [id number] Yes Take 4 mg by mouth every 8 (eight) hours as needed for vomiting or nausea (dissolve orally). [provider] Taking Active Family Member  potassium chloride (KLOR-CON M) 10 MEQ tablet [id number] Yes Take 1 tablet (10 mEq total) by mouth 2 (two) times daily. Earney Navy, NP Taking Active   QUEtiapine (SEROQUEL) 50 MG tablet [id number] Yes Take 1 tablet (50 mg total) by mouth 2 (two) times daily. Earney Navy, NP Taking Active   spironolactone (ALDACTONE) 25 MG tablet [id number] No Take 0.5 tablets (12.5 mg total) by mouth daily.  Patient not taking: Reported on 12/30/2022   Smitty Knudsen Not Taking Active Family Member           Med Note Micki Riley, Carolin Guernsey Jun 15, 2022 10:10 AM)                Assessment/Plan:   Medication Management: - Currently strategy insufficient to maintain appropriate adherence to prescribed medication regimen, but this is due to confusion after recent visit to ER with medication changes. Purpose of today's call to clarify and coordinate medication access. - Reviewed and updated medication list - Contacted pharmacy: albuterol, keflex, and eliquis will be ready 12/31/22 midday for pickup, and spironolactone needs refills, pharmacy sent request to PCP. - Daughter had to end call abruptly due to combative behavior from patient/family stress. Will reengage tomorrow 12/31/22 to complete the call.  Follow Up Plan: 1 day  Lynnda Shields, PharmD, BCPS Clinical Pharmacist [facility]

## 2022-12-30 NOTE — Progress Notes (Signed)
   Care Guide Note  12/30/2022 Name: Marisa Nunez MRN: [id number] DOB: January 08, 1946  Referred by: Mercie Eon, MD Reason for referral : Care Coordination (Outreach to schedule with Pharm d Thurston Hole )   Marisa Nunez is a 77 y.o. year old female who is a primary care patient of Zaviera, Siddiqui, MD. Marisa Nunez was referred to the pharmacist for assistance related to DM.    An unsuccessful telephone outreach was attempted today to contact the patient who was referred to the pharmacy team for assistance with medication management. Additional attempts will be made to contact the patient.   Penne Lash, RMA Care Guide [facility]  [place], [place] [address] Direct Dial: [phone] [email or web address]

## 2022-12-31 ENCOUNTER — Other Ambulatory Visit: Payer: Self-pay | Admitting: Student

## 2022-12-31 ENCOUNTER — Other Ambulatory Visit: Payer: Self-pay | Admitting: Internal Medicine

## 2022-12-31 ENCOUNTER — Other Ambulatory Visit: Payer: Medicare Other | Admitting: Pharmacist

## 2022-12-31 NOTE — Patient Instructions (Signed)
Latarra and Celine Mans,  Sorry our call was cut short - I will touch base Wednesday morning, 12/31/22, at your request to continue our conversation to organize the medications.  Take care, Elmarie Shiley, PharmD, BCPS Clinical Pharmacist [facility]

## 2022-12-31 NOTE — Patient Instructions (Signed)
Steph,  As a reminder, pick up the remaining prescriptions at the [facility] pharmacy for Alanah - they will be ready midday today. Try to give me a call back at a more convenient time for you - you can reach me at [phone]. If I am unable to answer the phone due to other patient visits or meetings, please leave me a voicemail of your upcoming available times for me to return your call.  Chat soon, Elmarie Shiley, PharmD, BCPS Clinical Pharmacist [facility]

## 2022-12-31 NOTE — Progress Notes (Addendum)
12/31/2022 Name: Marisa Nunez MRN: [id number] DOB: 02-15-1946   Marisa Nunez is a 77 y.o. year old female who presented for a telephone visit.   They were referred to the pharmacist by their Case Management Team  for assistance in managing  medication review, management, transition of care .    Subjective:  Care Team: Primary Care Provider: Mercie Eon, MD  Medication Access/Adherence  Current Pharmacy:  [facility] #[id number] [place], [place] - [phone] AT [facility] OF [facility] & [address] [place] [address] Phone: [phone] Fax: [phone]    Medication Management:  Current adherence strategy: in process, caregiver to pick up remaining available RX at pharmacy.  Previously used    Objective:  Lab Results  Component Value Date   HGBA1C 5.6 10/30/2020    Lab Results  Component Value Date   CREATININE 0.70 12/25/2022   BUN 16 12/25/2022   NA 138 12/25/2022   K 4.2 12/25/2022   CL 99 12/25/2022   CO2 29 12/25/2022    Lab Results  Component Value Date   CHOL 202 (H) 10/30/2020   HDL 88 10/30/2020   LDLCALC 100 (H) 10/30/2020   TRIG 70 10/30/2020   CHOLHDL 2.3 10/30/2020    Medications Reviewed Today     Reviewed by Gabriel Carina, RPH (Pharmacist) on 12/31/22 at 1032  Med List Status: <None>   Medication Order Taking? Sig Documenting Provider Last Dose Status Informant  acetaminophen (TYLENOL) 500 MG tablet [id number] No Take 1 tablet (500 mg total) by mouth every 6 (six) hours as needed for moderate pain, fever or mild pain. Pokhrel, Rebekah Chesterfield, MD Taking Active Family Member  albuterol (PROVENTIL) (2.5 MG/3ML) 0.083% nebulizer solution [id number] No Take 3 mLs (2.5 mg total) by nebulization every 4 (four) hours as needed for shortness of breath or wheezing. Earney Navy, NP Taking Active   apixaban (ELIQUIS) 5 MG TABS tablet [id number] No TAKE 1 TABLET(5 MG) BY MOUTH TWICE DAILY  Patient not  taking: Reported on 12/30/2022   Newman Nip, NP Not Taking Active Family Member           Med Note Sedonia Small Jun 30, 2022  4:55 PM)    cephALEXin (KEFLEX) 500 MG capsule [id number] No Take 1 capsule (500 mg total) by mouth every 12 (twelve) hours for 5 days.  Patient not taking: Reported on 12/30/2022   Earney Navy, NP Not Taking Active   divalproex (DEPAKOTE SPRINKLE) 125 MG capsule [id number] No Take 2 capsules (250 mg total) by mouth 2 (two) times daily. Earney Navy, NP Taking Active   furosemide (LASIX) 20 MG tablet [id number] No Take 2 tablets (40 mg total) by mouth daily.  Patient taking differently: Take 20 mg by mouth at bedtime.   Dickie La, MD Taking Active Family Member  Melatonin 10 MG TABS [id number] No Take 10 mg by mouth at bedtime. [provider] Taking Active Family Member  memantine (NAMENDA) 10 MG tablet [id number] No Take 1 tablet (10 mg total) by mouth 2 (two) times daily. Mercie Eon, MD Taking Active Family Member  metoprolol succinate (TOPROL-XL) 50 MG 24 hr tablet [id number] No Take 1 tablet (50 mg total) by mouth 2 (two) times daily. Take with or immediately following a meal. Earney Navy, NP Taking Active   Multiple Vitamins-Minerals (MULTIVITAMIN WITH MINERALS) tablet [id number] No Take 1 tablet by mouth daily with  breakfast. [provider] Taking Active Family Member  ondansetron (ZOFRAN-ODT) 4 MG disintegrating tablet [id number] No Take 4 mg by mouth every 8 (eight) hours as needed for vomiting or nausea (dissolve orally). [provider] Taking Active Family Member  potassium chloride (KLOR-CON M) 10 MEQ tablet [id number] No Take 1 tablet (10 mEq total) by mouth 2 (two) times daily. Earney Navy, NP Taking Active   QUEtiapine (SEROQUEL) 50 MG tablet [id number] No Take 1 tablet (50 mg total) by mouth 2 (two) times daily. Earney Navy, NP Taking Active   spironolactone (ALDACTONE) 25 MG tablet  [id number] No Take 0.5 tablets (12.5 mg total) by mouth daily.  Patient not taking: Reported on 12/30/2022   Smitty Knudsen Not Taking Active Family Member           Med Note Micki Riley, Carolin Guernsey Jun 15, 2022 10:10 AM)                Assessment/Plan:   Medication Management: - Tried to discuss medication management with patient's caregiver at time of scheduled call, she requested a different time for discussion as she was in the middle of working [employer]. - Notified caregiver of medication updates, available for pickup at pharmacy. - Suggested use of weekly pill box to organize medications, will have ongoing discussion when caregiver is available for phone appt   Follow Up Plan: providing phone number for caregiver to call me back.  Lynnda Shields, PharmD, BCPS Clinical Pharmacist [facility]

## 2023-01-02 ENCOUNTER — Other Ambulatory Visit (HOSPITAL_COMMUNITY)
Admission: AD | Admit: 2023-01-02 | Discharge: 2023-01-02 | Disposition: A | Discharge status: Home / Self Care | Payer: Medicare Other | Source: Ambulatory Visit | Attending: Internal Medicine | Admitting: Internal Medicine

## 2023-01-02 ENCOUNTER — Telehealth: Payer: Self-pay | Admitting: *Deleted

## 2023-01-02 DIAGNOSIS — F03918 Unspecified dementia, unspecified severity, with other behavioral disturbance: Secondary | ICD-10-CM

## 2023-01-02 LAB — CBC WITH DIFFERENTIAL/PLATELET
Abs Immature Granulocytes: 0.04 10*3/uL (ref 0.00–0.07)
Basophils Absolute: 0.1 10*3/uL (ref 0.0–0.1)
Basophils Relative: 1 %
Eosinophils Absolute: 0.1 10*3/uL (ref 0.0–0.5)
Eosinophils Relative: 1 %
HCT: 37.8 % (ref 36.0–46.0)
Hemoglobin: 11.8 g/dL — ABNORMAL LOW (ref 12.0–15.0)
Immature Granulocytes: 0 %
Lymphocytes Relative: 12 %
Lymphs Abs: 1.2 10*3/uL (ref 0.7–4.0)
MCH: 25.7 pg — ABNORMAL LOW (ref 26.0–34.0)
MCHC: 31.2 g/dL (ref 30.0–36.0)
MCV: 82.4 fL (ref 80.0–100.0)
Monocytes Absolute: 1 10*3/uL (ref 0.1–1.0)
Monocytes Relative: 9 %
Neutro Abs: 8.1 10*3/uL — ABNORMAL HIGH (ref 1.7–7.7)
Neutrophils Relative %: 77 %
Platelets: 325 10*3/uL (ref 150–400)
RBC: 4.59 MIL/uL (ref 3.87–5.11)
RDW: 16.3 % — ABNORMAL HIGH (ref 11.5–15.5)
WBC: 10.4 10*3/uL (ref 4.0–10.5)
nRBC: 0 % (ref 0.0–0.2)

## 2023-01-02 LAB — VALPROIC ACID LEVEL: Valproic Acid Lvl: 16 ug/mL — ABNORMAL LOW (ref 50.0–100.0)

## 2023-01-02 NOTE — Telephone Encounter (Signed)
Call to [name](Stephanie) asked about B/P on 12/31/2022.  States pulse was 94 and Oxygen was 98.  Patient was not in any distress.  Nurse asked her to call and report to doctor.  Was reported today.  Daughter will be bringing patient over for labwork today.  Daughter was advised to take patient to ER for follow up of her blood pressure.  Daughter stated she would take patient to ER as patient is as before showing no signs of distress.

## 2023-01-02 NOTE — Telephone Encounter (Signed)
Call from pt's dtr stating patient is suppose to have labs drawn today per Behavioral Health.  Pts dtr also wanted to make [facility] aware that RN from [facility] took pt's bp on 09/04 and got a reading of 98/56. Dtr was told to contact pcp.   See note copied and pasted from 09/02 [facility] discharge summary below:   "Medical Decision Making: Patient denies SI/HI/AVH.  Patient with hx of Dementia and occasionally gets agitated.  We have started patient on Depakote Sprinkle to help with the agitation.  She is also on Seroquel twice a day for agitation and  sleep.  Patient is Psychiatrically cleared.  Family Members instructed to take Patient to [facility] facility for Mental health need. Family to repeat Depakote level and CBC on  01/02/2023 at the lab chosen by PCP. Disposition: Psychiatrically cleared. Earney Navy, NP-PMHNP-BC 12/29/2022, 12:50 PM"  Lab orders were to be placed by pcp office-CMA placed orders for lab drawn at [facility] as [facility] is now closed. Triage nurse will also reach out to pt's dtr to follow up.

## 2023-01-12 ENCOUNTER — Emergency Department (HOSPITAL_COMMUNITY)
Admission: EM | Admit: 2023-01-12 | Discharge: 2023-01-13 | Disposition: A | Discharge status: Home / Self Care | Payer: Medicare Other | Attending: Emergency Medicine | Admitting: Emergency Medicine

## 2023-01-12 ENCOUNTER — Encounter (HOSPITAL_COMMUNITY): Payer: Self-pay

## 2023-01-12 ENCOUNTER — Other Ambulatory Visit: Payer: Self-pay

## 2023-01-12 ENCOUNTER — Emergency Department (HOSPITAL_COMMUNITY): Payer: Medicare Other

## 2023-01-12 DIAGNOSIS — F03918 Unspecified dementia, unspecified severity, with other behavioral disturbance: Secondary | ICD-10-CM | POA: Diagnosis present

## 2023-01-12 DIAGNOSIS — I482 Chronic atrial fibrillation, unspecified: Secondary | ICD-10-CM | POA: Insufficient documentation

## 2023-01-12 DIAGNOSIS — I509 Heart failure, unspecified: Secondary | ICD-10-CM | POA: Diagnosis not present

## 2023-01-12 DIAGNOSIS — Z7901 Long term (current) use of anticoagulants: Secondary | ICD-10-CM | POA: Diagnosis not present

## 2023-01-12 DIAGNOSIS — I110 Hypertensive heart disease with heart failure: Secondary | ICD-10-CM | POA: Diagnosis not present

## 2023-01-12 DIAGNOSIS — F0393 Unspecified dementia, unspecified severity, with mood disturbance: Secondary | ICD-10-CM | POA: Insufficient documentation

## 2023-01-12 DIAGNOSIS — J449 Chronic obstructive pulmonary disease, unspecified: Secondary | ICD-10-CM | POA: Insufficient documentation

## 2023-01-12 DIAGNOSIS — Z79899 Other long term (current) drug therapy: Secondary | ICD-10-CM | POA: Insufficient documentation

## 2023-01-12 DIAGNOSIS — Z853 Personal history of malignant neoplasm of breast: Secondary | ICD-10-CM | POA: Diagnosis not present

## 2023-01-12 DIAGNOSIS — R45851 Suicidal ideations: Secondary | ICD-10-CM

## 2023-01-12 LAB — CBC WITH DIFFERENTIAL/PLATELET
Abs Immature Granulocytes: 0.15 10*3/uL — ABNORMAL HIGH (ref 0.00–0.07)
Basophils Absolute: 0.1 10*3/uL (ref 0.0–0.1)
Basophils Relative: 1 %
Eosinophils Absolute: 0.1 10*3/uL (ref 0.0–0.5)
Eosinophils Relative: 1 %
HCT: 38.4 % (ref 36.0–46.0)
Hemoglobin: 12 g/dL (ref 12.0–15.0)
Immature Granulocytes: 1 %
Lymphocytes Relative: 15 %
Lymphs Abs: 1.6 10*3/uL (ref 0.7–4.0)
MCH: 26.8 pg (ref 26.0–34.0)
MCHC: 31.3 g/dL (ref 30.0–36.0)
MCV: 85.9 fL (ref 80.0–100.0)
Monocytes Absolute: 0.9 10*3/uL (ref 0.1–1.0)
Monocytes Relative: 8 %
Neutro Abs: 7.8 10*3/uL — ABNORMAL HIGH (ref 1.7–7.7)
Neutrophils Relative %: 74 %
Platelets: 334 10*3/uL (ref 150–400)
RBC: 4.47 MIL/uL (ref 3.87–5.11)
RDW: 16.8 % — ABNORMAL HIGH (ref 11.5–15.5)
WBC: 10.5 10*3/uL (ref 4.0–10.5)
nRBC: 0 % (ref 0.0–0.2)

## 2023-01-12 LAB — ETHANOL: Alcohol, Ethyl (B): <10 mg/dL (ref <10)

## 2023-01-12 LAB — COMPREHENSIVE METABOLIC PANEL
ALT: 13 U/L (ref 0–44)
AST: 19 U/L (ref 15–41)
Albumin: 3.8 g/dL (ref 3.5–5.0)
Alkaline Phosphatase: 113 U/L (ref 38–126)
Anion gap: 8 (ref 5–15)
BUN: 12 mg/dL (ref 8–23)
CO2: 27 mmol/L (ref 22–32)
Calcium: 9.2 mg/dL (ref 8.9–10.3)
Chloride: 99 mmol/L (ref 98–111)
Creatinine, Ser: 0.7 mg/dL (ref 0.44–1.00)
GFR, Estimated: >60 mL/min (ref >60)
Glucose, Bld: 116 mg/dL — ABNORMAL HIGH (ref 70–99)
Potassium: 3.7 mmol/L (ref 3.5–5.1)
Sodium: 134 mmol/L — ABNORMAL LOW (ref 135–145)
Total Bilirubin: 0.6 mg/dL (ref 0.3–1.2)
Total Protein: 7.1 g/dL (ref 6.5–8.1)

## 2023-01-12 LAB — VALPROIC ACID LEVEL: Valproic Acid Lvl: <10 ug/mL — ABNORMAL LOW (ref 50.0–100.0)

## 2023-01-12 MED ORDER — MEMANTINE HCL 5 MG PO TABS
10.0000 mg | ORAL_TABLET | Freq: Two times a day (BID) | ORAL | Status: DC
  Administered 2023-01-13 (×2): 10 mg via ORAL
  Filled 2023-01-12 (×2): qty 2

## 2023-01-12 MED ORDER — MELATONIN 5 MG PO TABS
10.0000 mg | ORAL_TABLET | Freq: Every day | ORAL | Status: DC
  Administered 2023-01-13: 10 mg via ORAL
  Filled 2023-01-12: qty 2

## 2023-01-12 MED ORDER — DIVALPROEX SODIUM 125 MG PO CSDR
250.0000 mg | DELAYED_RELEASE_CAPSULE | Freq: Two times a day (BID) | ORAL | Status: DC
  Administered 2023-01-13 (×2): 250 mg via ORAL
  Filled 2023-01-12 (×2): qty 2

## 2023-01-12 MED ORDER — SPIRONOLACTONE 12.5 MG HALF TABLET
12.5000 mg | ORAL_TABLET | Freq: Every day | ORAL | Status: DC
  Administered 2023-01-13: 12.5 mg via ORAL
  Filled 2023-01-12: qty 1

## 2023-01-12 MED ORDER — QUETIAPINE FUMARATE 50 MG PO TABS
50.0000 mg | ORAL_TABLET | Freq: Two times a day (BID) | ORAL | Status: DC
  Administered 2023-01-13 (×2): 50 mg via ORAL
  Filled 2023-01-12 (×2): qty 1

## 2023-01-12 MED ORDER — ADULT MULTIVITAMIN W/MINERALS CH
1.0000 | ORAL_TABLET | Freq: Every day | ORAL | Status: DC
  Administered 2023-01-13: 1 via ORAL
  Filled 2023-01-12: qty 1

## 2023-01-12 MED ORDER — FUROSEMIDE 40 MG PO TABS
20.0000 mg | ORAL_TABLET | Freq: Every day | ORAL | Status: DC
  Administered 2023-01-13: 20 mg via ORAL
  Filled 2023-01-12: qty 0.5
  Filled 2023-01-12: qty 1

## 2023-01-12 MED ORDER — METOPROLOL SUCCINATE ER 50 MG PO TB24
50.0000 mg | ORAL_TABLET | Freq: Two times a day (BID) | ORAL | Status: DC
  Administered 2023-01-13 (×2): 50 mg via ORAL
  Filled 2023-01-12 (×3): qty 1

## 2023-01-12 MED ORDER — APIXABAN 5 MG PO TABS
5.0000 mg | ORAL_TABLET | Freq: Two times a day (BID) | ORAL | Status: DC
  Administered 2023-01-13 (×2): 5 mg via ORAL
  Filled 2023-01-12 (×3): qty 1

## 2023-01-12 NOTE — ED Provider Notes (Signed)
[place] EMERGENCY DEPARTMENT AT [facility] Provider Note   CSN: [id number] Arrival date & time: 01/12/23  1734     History  Chief Complaint  Patient presents with   Suicidal    Marisa Nunez is a 77 y.o. female.  Pt is a 77 yo female with a pmhx significant for dementia, copd, cva, breast cancer, afib (on Eliquis), depression, htn, and chf.  Pt was brought in by the police because of behavioral outbursts.  Family said she has expressed thoughts of suicide.  She denies this currently.  She was seen here on 9/2 for similar complaints.         Home Medications Prior to Admission medications   Medication Sig Start Date End Date Taking? Authorizing Provider  acetaminophen (TYLENOL) 500 MG tablet Take 1 tablet (500 mg total) by mouth every 6 (six) hours as needed for moderate pain, fever or mild pain. 10/24/21   Pokhrel, Rebekah Chesterfield, MD  albuterol (PROVENTIL) (2.5 MG/3ML) 0.083% nebulizer solution Take 3 mLs (2.5 mg total) by nebulization every 4 (four) hours as needed for shortness of breath or wheezing. 12/29/22   Earney Navy, NP  apixaban (ELIQUIS) 5 MG TABS tablet TAKE 1 TABLET(5 MG) BY MOUTH TWICE DAILY Patient not taking: Reported on 12/30/2022 02/28/22   Newman Nip, NP  divalproex (DEPAKOTE SPRINKLE) 125 MG capsule Take 2 capsules (250 mg total) by mouth 2 (two) times daily. 12/29/22 01/28/23  Earney Navy, NP  furosemide (LASIX) 20 MG tablet Take 2 tablets (40 mg total) by mouth daily. Patient taking differently: Take 20 mg by mouth at bedtime. 02/11/22   Dickie La, MD  Melatonin 10 MG TABS Take 10 mg by mouth at bedtime.    [provider]  memantine (NAMENDA) 10 MG tablet Take 1 tablet (10 mg total) by mouth 2 (two) times daily. 08/27/22 08/27/23  Mercie Eon, MD  metoprolol succinate (TOPROL-XL) 50 MG 24 hr tablet Take 1 tablet (50 mg total) by mouth 2 (two) times daily. Take with or immediately following a meal. 12/29/22 01/28/23  Earney Navy, NP  Multiple Vitamins-Minerals (MULTIVITAMIN WITH MINERALS) tablet Take 1 tablet by mouth daily with breakfast.    [provider]  ondansetron (ZOFRAN-ODT) 4 MG disintegrating tablet Take 4 mg by mouth every 8 (eight) hours as needed for vomiting or nausea (dissolve orally). 06/29/22   [provider]  potassium chloride (KLOR-CON M) 10 MEQ tablet Take 1 tablet (10 mEq total) by mouth 2 (two) times daily. 12/29/22 01/28/23  Earney Navy, NP  QUEtiapine (SEROQUEL) 50 MG tablet Take 1 tablet (50 mg total) by mouth 2 (two) times daily. 12/29/22 01/28/23  Earney Navy, NP  spironolactone (ALDACTONE) 25 MG tablet TAKE 1/2 TABLET BY MOUTH DAILY 12/31/22   Marjie Skiff E, PA-C      Allergies    Influenza virus vaccine, Mirabegron, Myrbetriq [mirabegron er], Pneumococcal vaccine, and Ativan [lorazepam]    Review of Systems   Review of Systems  All other systems reviewed and are negative.   Physical Exam Updated Vital Signs BP (!) 139/110 (BP Location: Left Arm)   Pulse 98   Temp 98.1 F (36.7 C) (Oral)   Resp 20   LMP  (LMP Unknown)   SpO2 100%  Physical Exam Vitals and nursing note reviewed.  Constitutional:      Appearance: Normal appearance.  HENT:     Head: Normocephalic and atraumatic.     Right Ear: External  ear normal.     Left Ear: External ear normal.     Nose: Nose normal.     Mouth/Throat:     Mouth: Mucous membranes are moist.     Pharynx: Oropharynx is clear.  Eyes:     Extraocular Movements: Extraocular movements intact.     Conjunctiva/sclera: Conjunctivae normal.     Pupils: Pupils are equal, round, and reactive to light.  Cardiovascular:     Rate and Rhythm: Normal rate. Rhythm irregular.     Pulses: Normal pulses.     Heart sounds: Normal heart sounds.  Pulmonary:     Effort: Pulmonary effort is normal.     Breath sounds: Normal breath sounds.  Abdominal:     General: Abdomen is flat. Bowel sounds are normal.      Palpations: Abdomen is soft.  Musculoskeletal:        General: Normal range of motion.     Cervical back: Normal range of motion and neck supple.  Skin:    General: Skin is warm.     Capillary Refill: Capillary refill takes less than 2 seconds.  Neurological:     General: No focal deficit present.     Mental Status: She is alert. Mental status is at baseline.  Psychiatric:        Mood and Affect: Mood normal.        Behavior: Behavior normal.        Thought Content: Thought content normal.        Judgment: Judgment normal.     ED Results / Procedures / Treatments   Labs (all labs ordered are listed, but only abnormal results are displayed) Labs Reviewed  COMPREHENSIVE METABOLIC PANEL - Abnormal; Notable for the following components:      Result Value   Sodium 134 (*)    Glucose, Bld 116 (*)    All other components within normal limits  CBC WITH DIFFERENTIAL/PLATELET - Abnormal; Notable for the following components:   RDW 16.8 (*)    Neutro Abs 7.8 (*)    Abs Immature Granulocytes 0.15 (*)    All other components within normal limits  ETHANOL  RAPID URINE DRUG SCREEN, HOSP PERFORMED  URINALYSIS, ROUTINE W REFLEX MICROSCOPIC  VALPROIC ACID LEVEL    EKG EKG Interpretation Date/Time:  Monday January 12 2023 20:53:21 EDT Ventricular Rate:  106 PR Interval:    QRS Duration:  96 QT Interval:  342 QTC Calculation: 454 R Axis:   -6  Text Interpretation: Atrial fibrillation with rapid ventricular response Abnormal ECG When compared with ECG of 12-Jan-2023 20:48, PREVIOUS ECG IS PRESENT duplicate Confirmed by Jacalyn Lefevre [phone]) on 01/12/2023 8:55:59 PM  Radiology DG Chest 2 View  Result Date: 01/12/2023 CLINICAL DATA:  Suicidal ideations EXAM: CHEST - 2 VIEW COMPARISON:  06/14/2022 FINDINGS: Cardiac shadow is mildly enlarged but stable. Aortic calcifications are seen. The lungs are well aerated bilaterally. No focal infiltrate or effusion is seen. 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 Scoliosis of the thoracolumbar spine is noted. IMPRESSION: No acute abnormality noted. Electronically Signed   By: Alcide Clever M.D.   On: 01/12/2023 20:33    Procedures Procedures    Medications Ordered in ED Medications  apixaban (ELIQUIS) tablet 5 mg (has no administration in time range)  divalproex (DEPAKOTE SPRINKLE) capsule 250 mg (has no administration in time range)  furosemide (LASIX) tablet 20 mg (has no administration in time range)  Melatonin TABS 10 mg (has no administration in time range)  memantine (NAMENDA) tablet 10 mg (has no administration in time range)  metoprolol succinate (TOPROL-XL) 24 hr tablet 50 mg (has no administration in time range)  multivitamin with minerals tablet 1 tablet (has no administration in time range)  QUEtiapine (SEROQUEL) tablet 50 mg (has no administration in time range)  spironolactone (ALDACTONE) tablet 12.5 mg (has no administration in time range)    ED Course/ Medical Decision Making/ A&P                                 Medical Decision Making Amount and/or Complexity of Data Reviewed Labs: ordered. Radiology: ordered.  Risk OTC drugs. Prescription drug management.   This patient presents to the ED for concern of behavioral disturbances, this involves an extensive number of treatment options, and is a complaint that carries with it a high risk of complications and morbidity.  The differential diagnosis includes dementia, infection, depression   Co morbidities that complicate the patient evaluation  dementia, copd, cva, breast cancer, afib (on Eliquis), depression, htn, and chf   Additional history obtained:  Additional history obtained from epic chart review External records from outside source obtained and reviewed including police   Lab Tests:  I Ordered, and personally interpreted labs.  The pertinent results include:  cbc nl, cmp nl, etoh neg,    Imaging Studies ordered:  I ordered  imaging studies including cxr  I independently visualized and interpreted imaging which showed No acute abnormality noted.  I agree with the radiologist interpretation   Cardiac Monitoring:  The patient was maintained on a cardiac monitor.  I personally viewed and interpreted the cardiac monitored which showed an underlying rhythm of: afib   Medicines ordered and prescription drug management:   I have reviewed the patients home medicines and have made adjustments as needed  Consultations Obtained:  I requested consultation with TTS,  and discussed lab and imaging findings as well as pertinent plan - they recommend: consult pending   Problem List / ED Course:  Dementia with behavior disturbances:  TTS consult made Afib:  chronic; chadvasc 3. On eliquis   Reevaluation:  After the interventions noted above, I reevaluated the patient and found that they have :improved   Social Determinants of Health:  Lives at home   Dispostion:  After consideration of the diagnostic results and the patients response to treatment, I feel that the patent would benefit from TTS consult.          Final Clinical Impression(s) / ED Diagnoses Final diagnoses:  Dementia with mood disturbance, unspecified dementia severity, unspecified dementia type (HCC)  Atrial fibrillation, chronic (HCC)    Rx / DC Orders ED Discharge Orders     None         Jacalyn Lefevre, MD 01/12/23 2101

## 2023-01-12 NOTE — ED Notes (Signed)
2 bags of patient belongings was placed at the 1-8 nurses station.

## 2023-01-12 NOTE — ED Triage Notes (Signed)
Arrived via [employer] for behavioral outbursts. Pt states she wishes she was dead and wants family to shoot her. Pt has had these episodes in the past due to her dementia.

## 2023-01-13 LAB — URINALYSIS, ROUTINE W REFLEX MICROSCOPIC
Bilirubin Urine: NEGATIVE
Glucose, UA: NEGATIVE mg/dL
Hgb urine dipstick: NEGATIVE
Ketones, ur: NEGATIVE mg/dL
Nitrite: NEGATIVE
Protein, ur: NEGATIVE mg/dL
Specific Gravity, Urine: 1.009 (ref 1.005–1.030)
pH: 6 (ref 5.0–8.0)

## 2023-01-13 LAB — RAPID URINE DRUG SCREEN, HOSP PERFORMED
Amphetamines: NOT DETECTED
Barbiturates: NOT DETECTED
Benzodiazepines: NOT DETECTED
Cocaine: NOT DETECTED
Opiates: NOT DETECTED
Tetrahydrocannabinol: NOT DETECTED

## 2023-01-13 MED ORDER — CITALOPRAM HYDROBROMIDE 10 MG PO TABS: 10.0000 mg | ORAL_TABLET | Freq: Every day | ORAL | 0 refills | Status: DC

## 2023-01-13 MED ORDER — CITALOPRAM HYDROBROMIDE 20 MG PO TABS
20.0000 mg | ORAL_TABLET | Freq: Every day | ORAL | 0 refills | Status: AC
Start: 2023-01-21 — End: 2023-02-20

## 2023-01-13 NOTE — ED Notes (Signed)
Pt awake and alert, NAD noted.

## 2023-01-13 NOTE — Progress Notes (Signed)
[facility] Consult Note  Patient Name: Marisa Nunez MRN: [id number] DOB: Jun 18, 1945 DATE OF Consult: 01/13/2023  PRIMARY PSYCHIATRIC DIAGNOSES  1.  Major neurocognitive disorder, severe, with behavioral dyscontrol   RECOMMENDATIONS  Recommendations: Medication recommendations:  start citalopram 10 mg daily for 7 days then increase to 20 mg daily Non-Medication/therapeutic recommendations:  consider memory care facility Is inpatient psychiatric hospitalization recommended for this patient? No (Explain why):  due to advanced dementia, patient would not benefit from inpatient admission Is another care setting recommended for this patient? (examples may include Crisis Stabilization Unit, Residential/Recovery Treatment, ALF/SNF, Memory Care Unit)  Yes (Explain why):  patient would benefit from memory care facility From a psychiatric perspective, is this patient appropriate for discharge to an outpatient setting/resource or other less restrictive environment for continued care?  Yes (Explain why):  discharge home Communication: Treatment team members (and family members if applicable) who were involved in treatment/care discussions and planning, and with whom we spoke or engaged with via secure text/chat, include the following: MD and RN   Thank you for involving [place] in the care of this patient. If you have any additional questions or concerns, please call [phone] and ask for me or the provider on-call.  TELEPSYCHIATRY ATTESTATION & CONSENT  As the provider for this telehealth consult, I attest that I verified the patient's identity using two separate identifiers, introduced myself to the patient, provided my credentials, disclosed my location, and performed this encounter via a HIPAA-compliant, real-time, face-to-face, two-way, interactive audio and video platform and with the full consent and agreement of the patient (or guardian as applicable.)  Patient physical location: [facility]  Emergency Department at [facility] . Telehealth provider physical location: home office in state of [place].  Video start time: 12 am CST (Central Time) Video end time: 12:35 am (Central Time)  IDENTIFYING DATA  Marisa Nunez is a 77 y.o. year-old female for whom a psychiatric consultation has been ordered by the primary provider. The patient was identified using two separate identifiers.  CHIEF COMPLAINT/REASON FOR CONSULT   behavioral dyscontrol  HISTORY OF PRESENT ILLNESS (HPI)  The patient  is a 77 year old female diagnosed with major neurocognitive disorder 3 years ago.  Her presentation has been worsening recently.  She was brought in today by her daughter after saying she wishes she was dead and asking her family to shoot her. By the time I see her in the emergency department, she is calm and cooperative.  She initially tells me she is in the hospital because she thought she needed to get some tests done.  She denies feeling sad, denies feeling irritable.  She says she lives with her daughter [name], her husband and their son.  When I ask her age, she tells me she is in her 79s or 32s but is not sure.  She reports enjoying to read books a lot. She adamantly denies suicidal thoughts.  Says that she only wants to die when it is time.  Denies feeling angry earlier today. I ask again where she is at and she tells me she is at her daughter's house.  She says she has not gone to the hospital today at all.   She reports taking her medications as prescribed and denies having trouble with that. She thanks me for the "pleasant conversation".Marland Kitchen  PAST PSYCHIATRIC HISTORY   Otherwise as per HPI above.  PAST MEDICAL HISTORY  Past Medical History:  Diagnosis Date   Anxiety    Breast  cancer (HCC)    Chronic combined systolic and diastolic CHF (congestive heart failure) (HCC)    a. Echo 11/2021: LVEF of 40-45% with global hypokinesis, mildly reduced RV systolic function, and moderate MR    COPD (chronic obstructive pulmonary disease) (HCC)    Dementia (HCC)    Depression    HTN (hypertension)    Paroxysmal atrial fibrillation (HCC) 2017   Stroke [facility])      HOME MEDICATIONS  Facility Ordered Medications  Medication   apixaban (ELIQUIS) tablet 5 mg   divalproex (DEPAKOTE SPRINKLE) capsule 250 mg   furosemide (LASIX) tablet 20 mg   melatonin tablet 10 mg   memantine (NAMENDA) tablet 10 mg   metoprolol succinate (TOPROL-XL) 24 hr tablet 50 mg   multivitamin with minerals tablet 1 tablet   QUEtiapine (SEROQUEL) tablet 50 mg   spironolactone (ALDACTONE) tablet 12.5 mg   PTA Medications  Medication Sig   Multiple Vitamins-Minerals (MULTIVITAMIN WITH MINERALS) tablet Take 1 tablet by mouth daily with breakfast.   acetaminophen (TYLENOL) 500 MG tablet Take 1 tablet (500 mg total) by mouth every 6 (six) hours as needed for moderate pain, fever or mild pain.   furosemide (LASIX) 20 MG tablet Take 2 tablets (40 mg total) by mouth daily. (Patient taking differently: Take 20 mg by mouth in the morning and at bedtime.)   apixaban (ELIQUIS) 5 MG TABS tablet TAKE 1 TABLET(5 MG) BY MOUTH TWICE DAILY (Patient taking differently: Take 5 mg by mouth 2 (two) times daily.)   ondansetron (ZOFRAN-ODT) 4 MG disintegrating tablet Take 4 mg by mouth every 8 (eight) hours as needed for vomiting or nausea (dissolve orally).   memantine (NAMENDA) 10 MG tablet Take 1 tablet (10 mg total) by mouth 2 (two) times daily.   Melatonin 10 MG TABS Take 10 mg by mouth at bedtime.   metoprolol succinate (TOPROL-XL) 50 MG 24 hr tablet Take 1 tablet (50 mg total) by mouth 2 (two) times daily. Take with or immediately following a meal.   QUEtiapine (SEROQUEL) 50 MG tablet Take 1 tablet (50 mg total) by mouth 2 (two) times daily.   divalproex (DEPAKOTE SPRINKLE) 125 MG capsule Take 2 capsules (250 mg total) by mouth 2 (two) times daily.   potassium chloride (KLOR-CON M) 10 MEQ tablet Take 1 tablet (10 mEq total) by  mouth 2 (two) times daily.   spironolactone (ALDACTONE) 25 MG tablet TAKE 1/2 TABLET BY MOUTH DAILY   albuterol (PROVENTIL) (2.5 MG/3ML) 0.083% nebulizer solution Take 3 mLs (2.5 mg total) by nebulization every 4 (four) hours as needed for shortness of breath or wheezing. (Patient not taking: Reported on 01/12/2023)    ALLERGIES  Allergies  Allergen Reactions   Influenza Virus Vaccine Hives   Myrbetriq Theodosia Paling Er] Other (See Comments)    Confusion, agitation, hallucinations, psychosis   Pneumococcal Vaccine Hives   Ativan [Lorazepam] Other (See Comments)    "Made the patient agitated,"  per daughter     SOCIAL & SUBSTANCE USE HISTORY  Social History   Socioeconomic History   Marital status: Single    Spouse name: Not on file   Number of children: Not on file   Years of education: Not on file   Highest education level: Not on file  Occupational History   Not on file  Tobacco Use   Smoking status: Former    Current packs/day: 0.50    Types: Cigarettes   Smokeless tobacco: Not on file  Vaping Use   Vaping status:  Never Used  Substance and Sexual Activity   Alcohol use: Not Currently    Alcohol/week: 6.0 standard drinks of alcohol    Types: 6 Cans of beer per week   Drug use: Not Currently   Sexual activity: Yes  Other Topics Concern   Not on file  Social History Narrative   Not on file   Social Determinants of Health   Financial Resource Strain: Low Risk  (06/26/2022)   Overall Financial Resource Strain (CARDIA)    Difficulty of Paying Living Expenses: Not hard at all  Food Insecurity: No Food Insecurity (06/30/2022)   Hunger Vital Sign    Worried About Running Out of Food in the Last Year: Never true    Ran Out of Food in the Last Year: Never true  Transportation Needs: No Transportation Needs (06/30/2022)   PRAPARE - Administrator, Civil Service (Medical): No    Lack of Transportation (Non-Medical): No  Physical Activity: Sufficiently Active  (06/26/2022)   Exercise Vital Sign    Days of Exercise per Week: 7 days    Minutes of Exercise per Session: 50 min  Stress: No Stress Concern Present (06/26/2022)   [employer] of Occupational Health - Occupational Stress Questionnaire    Feeling of Stress : Not at all  Social Connections: Socially Isolated (06/26/2022)   Social Connection and Isolation Panel [NHANES]    Frequency of Communication with Friends and Family: More than three times a week    Frequency of Social Gatherings with Friends and Family: More than three times a week    Attends Religious Services: Never    Database administrator or Organizations: No    Attends Engineer, structural: Never    Marital Status: Divorced   Social History   Tobacco Use  Smoking Status Former   Current packs/day: 0.50   Types: Cigarettes  Smokeless Tobacco Not on file   Social History   Substance and Sexual Activity  Alcohol Use Not Currently   Alcohol/week: 6.0 standard drinks of alcohol   Types: 6 Cans of beer per week   Social History   Substance and Sexual Activity  Drug Use Not Currently      FAMILY HISTORY  Family History  Problem Relation Age of Onset   Arrhythmia Father    Family Psychiatric History (if known):  denies  MENTAL STATUS EXAM (MSE)  Presentation  General Appearance:  Appropriate for Environment  Eye Contact: Good  Speech: Clear and Coherent  Speech Volume: Normal  Handedness: Right   Mood and Affect  Mood: Euthymic  Affect: Appropriate   Thought Process  Thought Processes: Linear  Descriptions of Associations: Intact  Orientation: Partial  Thought Content: Logical  History of Schizophrenia/Schizoaffective disorder: No  Duration of Psychotic Symptoms:No data recorded Hallucinations: Hallucinations: None  Ideas of Reference: None  Suicidal Thoughts: Suicidal Thoughts: No  Homicidal Thoughts: Homicidal Thoughts: No   Sensorium   Memory: Immediate Poor; Recent Poor; Remote Poor  Judgment: Impaired  Insight: None   Executive Functions  Concentration: Poor  Attention Span: Poor  Recall: Poor  Fund of Knowledge: Poor  Language: Fair   Psychomotor Activity  Psychomotor Activity: Psychomotor Activity: Normal   Assets  Assets: Housing; Other (comment) (family support)   Sleep  Sleep: Sleep: Fair   VITALS  Blood pressure (!) 139/110, pulse 98, temperature 98.1 F (36.7 C), temperature source Oral, resp. rate 20, SpO2 100%.  LABS  Admission on 01/12/2023  Component Date Value  Ref Range Status   Sodium 01/12/2023 134 (L)  135 - 145 mmol/L Final   Potassium 01/12/2023 3.7  3.5 - 5.1 mmol/L Final   Chloride 01/12/2023 99  98 - 111 mmol/L Final   CO2 01/12/2023 27  22 - 32 mmol/L Final   Glucose, Bld 01/12/2023 116 (H)  70 - 99 mg/dL Final   Glucose reference range applies only to samples taken after fasting for at least 8 hours.   BUN 01/12/2023 12  8 - 23 mg/dL Final   Creatinine, Ser 01/12/2023 0.70  0.44 - 1.00 mg/dL Final   Calcium 42/59/5638 9.2  8.9 - 10.3 mg/dL Final   Total Protein 75/64/3329 7.1  6.5 - 8.1 g/dL Final   Albumin 51/88/4166 3.8  3.5 - 5.0 g/dL Final   AST 10/26/1599 19  15 - 41 U/L Final   ALT 01/12/2023 13  0 - 44 U/L Final   Alkaline Phosphatase 01/12/2023 113  38 - 126 U/L Final   Total Bilirubin 01/12/2023 0.6  0.3 - 1.2 mg/dL Final   GFR, Estimated 01/12/2023 >60  >60 mL/min Final   Comment: (NOTE) Calculated using the CKD-EPI Creatinine Equation (2021)    Anion gap 01/12/2023 8  5 - 15 Final   Performed at [facility], [address] [place], [place] [address]   Alcohol, Ethyl (B) 01/12/2023 <10  <10 mg/dL Final   Comment: (NOTE) Lowest detectable limit for serum alcohol is 10 mg/dL.  For medical purposes only. Performed at [facility], [address] [place], [place] [address]    Opiates 01/13/2023 NONE  DETECTED  NONE DETECTED Final   Cocaine 01/13/2023 NONE DETECTED  NONE DETECTED Final   Benzodiazepines 01/13/2023 NONE DETECTED  NONE DETECTED Final   Amphetamines 01/13/2023 NONE DETECTED  NONE DETECTED Final   Tetrahydrocannabinol 01/13/2023 NONE DETECTED  NONE DETECTED Final   Barbiturates 01/13/2023 NONE DETECTED  NONE DETECTED Final   Comment: (NOTE) DRUG SCREEN FOR MEDICAL PURPOSES ONLY.  IF CONFIRMATION IS NEEDED FOR ANY PURPOSE, NOTIFY LAB WITHIN 5 DAYS.  LOWEST DETECTABLE LIMITS FOR URINE DRUG SCREEN Drug Class                     Cutoff (ng/mL) Amphetamine and metabolites    1000 Barbiturate and metabolites    200 Benzodiazepine                 200 Opiates and metabolites        300 Cocaine and metabolites        300 THC                            50 Performed at [facility], [address], [place], [place] [address]    WBC 01/12/2023 10.5  4.0 - 10.5 K/uL Final   RBC 01/12/2023 4.47  3.87 - 5.11 MIL/uL Final   Hemoglobin 01/12/2023 12.0  12.0 - 15.0 g/dL Final   HCT 27/09/2374 38.4  36.0 - 46.0 % Final   MCV 01/12/2023 85.9  80.0 - 100.0 fL Final   MCH 01/12/2023 26.8  26.0 - 34.0 pg Final   MCHC 01/12/2023 31.3  30.0 - 36.0 g/dL Final   RDW 28/31/5176 16.8 (H)  11.5 - 15.5 % Final   Platelets 01/12/2023 334  150 - 400 K/uL Final   nRBC 01/12/2023 0.0  0.0 - 0.2 % Final   Neutrophils Relative %  01/12/2023 74  % Final   Neutro Abs 01/12/2023 7.8 (H)  1.7 - 7.7 K/uL Final   Lymphocytes Relative 01/12/2023 15  % Final   Lymphs Abs 01/12/2023 1.6  0.7 - 4.0 K/uL Final   Monocytes Relative 01/12/2023 8  % Final   Monocytes Absolute 01/12/2023 0.9  0.1 - 1.0 K/uL Final   Eosinophils Relative 01/12/2023 1  % Final   Eosinophils Absolute 01/12/2023 0.1  0.0 - 0.5 K/uL Final   Basophils Relative 01/12/2023 1  % Final   Basophils Absolute 01/12/2023 0.1  0.0 - 0.1 K/uL Final   Immature Granulocytes 01/12/2023 1  % Final   Abs Immature Granulocytes  01/12/2023 0.15 (H)  0.00 - 0.07 K/uL Final   Performed at [facility], [address], [place], [place] [address]   Color, Urine 01/13/2023 YELLOW  YELLOW Final   APPearance 01/13/2023 CLEAR  CLEAR Final   Specific Gravity, Urine 01/13/2023 1.009  1.005 - 1.030 Final   pH 01/13/2023 6.0  5.0 - 8.0 Final   Glucose, UA 01/13/2023 NEGATIVE  NEGATIVE mg/dL Final   Hgb urine dipstick 01/13/2023 NEGATIVE  NEGATIVE Final   Bilirubin Urine 01/13/2023 NEGATIVE  NEGATIVE Final   Ketones, ur 01/13/2023 NEGATIVE  NEGATIVE mg/dL Final   Protein, ur 16/04/930 NEGATIVE  NEGATIVE mg/dL Final   Nitrite 35/57/3220 NEGATIVE  NEGATIVE Final   Leukocytes,Ua 01/13/2023 LARGE (A)  NEGATIVE Final   RBC / HPF 01/13/2023 0-5  0 - 5 RBC/hpf Final   WBC, UA 01/13/2023 11-20  0 - 5 WBC/hpf Final   Bacteria, UA 01/13/2023 RARE (A)  NONE SEEN Final   Squamous Epithelial / HPF 01/13/2023 0-5  0 - 5 /HPF Final   Mucus 01/13/2023 PRESENT   Final   Performed at [facility], [address] [place], [place] [address]   Valproic Acid Lvl 01/12/2023 <10 (L)  50.0 - 100.0 ug/mL Final   Comment: RESULT CONFIRMED BY MANUAL DILUTION Performed at [facility], [address], [place], [place] [address]     PSYCHIATRIC REVIEW OF SYSTEMS (ROS)  ROS: Notable for the following relevant positive findings: ROS  Additional findings:      Musculoskeletal: No abnormal movements observed      Gait & Station: Laying/Sitting      Pain Screening: Denies      RISK FORMULATION/ASSESSMENT  Is the patient experiencing any suicidal or homicidal ideations: No       Explain if yes:  patient was brought into the ED due to having made suicidal statements and asking her family to shoot her.  By the time I evaluated her, she adamantly denies such thoughts and feelings. Protective factors considered for safety management:   Family support  Risk factors/concerns considered for safety  management:  neurocognitive disorder   Is there a safety management plan with the patient and treatment team to minimize risk factors and promote protective factors: Yes           Explain:  discharge home with family Is crisis care placement or psychiatric hospitalization recommended: No     Based on my current evaluation and risk assessment, patient is determined at this time to be at:  Low risk  *RISK ASSESSMENT Risk assessment is a dynamic process; it is possible that this patient's condition, and risk level, may change. This should be re-evaluated and managed over time as appropriate. Please re-consult psychiatric consult services if additional assistance is needed in terms  of risk assessment and management. If your team decides to discharge this patient, please advise the patient how to best access emergency psychiatric services, or to call 911, if their condition worsens or they feel unsafe in any way.   Dian Situ, MD Telepsychiatry Consult ServicesPatient ID: Amaryllis Dyke, female   DOB: October 31, 1945, 77 y.o.   MRN: [id number]

## 2023-01-13 NOTE — Progress Notes (Signed)
CSW was notified by psych NP that she spoke with pt's daughter who was reportedly upset and disconnected the call.   This CSW contacted pt's daughter, [name], to inquire about what resources she may be looking for. [name] reported that she already is working with [facility] [employer] and just got an FL2. CSW encouraged [name] to inform the representative from [facility]. [name] verbalized understanding. CSW will also send a follow up message to [facility] staff to reach out to family.   [name] reports she is currently working but will get off as soon as she can to pick the pt up. RN notified via secure chat.

## 2023-01-13 NOTE — Discharge Instructions (Signed)
Thank you for coming to [facility]. You were seen for suicidal thoughts. You were evaluated by psychiatry who recommended starting citalopram 10 mg daily for 7 days and then increasing to 20 mg daily. Please follow up with your primary care provider within 1 week.   Do not hesitate to return to the ED or call 911 if you experience: -Worsening symptoms -Lightheadedness, passing out -Fevers/chills -Anything else that concerns you

## 2023-01-13 NOTE — ED Notes (Signed)
Patient Alert and oriented to baseline. Stable and ambulatory to baseline. Patient verbalized understanding of the discharge instructions.  Patient belongings were taken by the patient.   

## 2023-01-13 NOTE — Progress Notes (Addendum)
TOC consulted to contact family. Inquired from Pysch NP whether family is aware of pt's psych clearance. Awaiting response.   Addend @ 10:41 AM Psych NP to outreach to family to notify of psych clearance. No TOC needs identified at this time.

## 2023-01-13 NOTE — BH Assessment (Signed)
This consult has been deferred to [facility]. Pt will be seen by Dr. Jerene Dilling at [phone].

## 2023-01-20 ENCOUNTER — Other Ambulatory Visit: Payer: Self-pay

## 2023-01-20 MED ORDER — FUROSEMIDE 20 MG PO TABS: 40.0000 mg | ORAL_TABLET | Freq: Every day | ORAL | 3 refills | Status: AC

## 2023-01-26 NOTE — Progress Notes (Unsigned)
[facility]: Clinic Note  Subjective:  History of Present Illness: Marisa Nunez is a 77 y.o. year old female who presents to establish care with me.  Lots of ED visits for dementia with mood disturbances  Med rec    Please refer to Assessment and Plan below for full details in Problem-Based Charting.   Past Medical History:  Patient Active Problem List   Diagnosis Date Noted   Dementia with behavioral disturbance (HCC) 12/26/2022   Occult GI bleeding 06/30/2022   Hypotension 06/27/2022   Weight loss 06/27/2022   Persistent atrial fibrillation (HCC)    COPD (chronic obstructive pulmonary disease) (HCC) 10/19/2021   Overactive bladder 05/24/2021   Vaginal lesion 05/24/2021   History of TIA 10/30/2020   History of cerebral infarction 10/29/2020   History of right breast cancer 02/25/2018   History of tobacco use 05/06/2017   Mixed hyperlipidemia 05/06/2017   Osteopenia of multiple sites 05/06/2017   Essential hypertension 10/25/2015   PAF (paroxysmal atrial fibrillation) (HCC) 10/25/2015      Medications:  Current Outpatient Medications:    acetaminophen (TYLENOL) 500 MG tablet, Take 1 tablet (500 mg total) by mouth every 6 (six) hours as needed for moderate pain, fever or mild pain., Disp: 30 tablet, Rfl: 0   albuterol (PROVENTIL) (2.5 MG/3ML) 0.083% nebulizer solution, Take 3 mLs (2.5 mg total) by nebulization every 4 (four) hours as needed for shortness of breath or wheezing. (Patient not taking: Reported on 01/12/2023), Disp: 75 mL, Rfl: 12   albuterol (VENTOLIN HFA) 108 (90 Base) MCG/ACT inhaler, Inhale 2 puffs into the lungs every 6 (six) hours as needed for wheezing or shortness of breath., Disp: , Rfl:    apixaban (ELIQUIS) 5 MG TABS tablet, TAKE 1 TABLET(5 MG) BY MOUTH TWICE DAILY (Patient taking differently: Take 5 mg by mouth 2 (two) times daily.), Disp: 42 tablet, Rfl: 0   citalopram (CELEXA) 10 MG tablet, Take 1 tablet (10 mg total) by  mouth daily for 7 days., Disp: 7 tablet, Rfl: 0   citalopram (CELEXA) 20 MG tablet, Take 1 tablet (20 mg total) by mouth daily., Disp: 30 tablet, Rfl: 0   divalproex (DEPAKOTE SPRINKLE) 125 MG capsule, Take 2 capsules (250 mg total) by mouth 2 (two) times daily., Disp: 120 capsule, Rfl: 0   furosemide (LASIX) 20 MG tablet, Take 2 tablets (40 mg total) by mouth daily., Disp: 90 tablet, Rfl: 3   Melatonin 10 MG TABS, Take 10 mg by mouth at bedtime., Disp: , Rfl:    memantine (NAMENDA) 10 MG tablet, Take 1 tablet (10 mg total) by mouth 2 (two) times daily., Disp: 180 tablet, Rfl: 3   metoprolol succinate (TOPROL-XL) 50 MG 24 hr tablet, Take 1 tablet (50 mg total) by mouth 2 (two) times daily. Take with or immediately following a meal., Disp: 60 tablet, Rfl: 0   Multiple Vitamins-Minerals (MULTIVITAMIN WITH MINERALS) tablet, Take 1 tablet by mouth daily with breakfast., Disp: , Rfl:    ondansetron (ZOFRAN-ODT) 4 MG disintegrating tablet, Take 4 mg by mouth every 8 (eight) hours as needed for vomiting or nausea (dissolve orally)., Disp: , Rfl:    potassium chloride (KLOR-CON M) 10 MEQ tablet, Take 1 tablet (10 mEq total) by mouth 2 (two) times daily., Disp: 60 tablet, Rfl: 0   QUEtiapine (SEROQUEL) 50 MG tablet, Take 1 tablet (50 mg total) by mouth 2 (two) times daily., Disp: 60 tablet, Rfl: 0   spironolactone (ALDACTONE) 25 MG tablet, TAKE  1/2 TABLET BY MOUTH DAILY, Disp: 15 tablet, Rfl: 0   Allergies: Allergies  Allergen Reactions   Influenza Virus Vaccine Hives   Myrbetriq Theodosia Paling Er] Other (See Comments)    Confusion, agitation, hallucinations, psychosis   Pneumococcal Vaccine Hives   Ativan [Lorazepam] Other (See Comments)    "Made the patient agitated,"  per daughter        Objective:   Vitals: There were no vitals filed for this visit.   Physical Exam: Physical Exam   Data: Labs, imaging, and micro were reviewed in Epic. Refer to Assessment and Plan below for full details in  Problem-Based Charting.  Assessment & Plan:  No problem-specific Assessment & Plan notes found for this encounter.     Patient will follow up in ***  Mercie Eon, MD

## 2023-01-28 ENCOUNTER — Encounter: Payer: Self-pay | Admitting: Internal Medicine

## 2023-01-28 ENCOUNTER — Ambulatory Visit: Payer: Medicare Other

## 2023-01-28 ENCOUNTER — Ambulatory Visit: Payer: Medicare Other | Admitting: Internal Medicine

## 2023-01-28 VITALS — BP 134/86 | HR 46 | Temp 98.4°F | Ht 64.0 in | Wt 115.4 lb

## 2023-01-28 DIAGNOSIS — Z0000 Encounter for general adult medical examination without abnormal findings: Secondary | ICD-10-CM | POA: Diagnosis not present

## 2023-01-28 DIAGNOSIS — Z853 Personal history of malignant neoplasm of breast: Secondary | ICD-10-CM

## 2023-01-28 DIAGNOSIS — E782 Mixed hyperlipidemia: Secondary | ICD-10-CM | POA: Diagnosis not present

## 2023-01-28 DIAGNOSIS — I6300 Cerebral infarction due to thrombosis of unspecified precerebral artery: Secondary | ICD-10-CM | POA: Diagnosis not present

## 2023-01-28 DIAGNOSIS — I4819 Other persistent atrial fibrillation: Secondary | ICD-10-CM

## 2023-01-28 DIAGNOSIS — I10 Essential (primary) hypertension: Secondary | ICD-10-CM | POA: Diagnosis not present

## 2023-01-28 DIAGNOSIS — F03918 Unspecified dementia, unspecified severity, with other behavioral disturbance: Secondary | ICD-10-CM | POA: Diagnosis not present

## 2023-01-28 DIAGNOSIS — M8589 Other specified disorders of bone density and structure, multiple sites: Secondary | ICD-10-CM

## 2023-01-28 DIAGNOSIS — I480 Paroxysmal atrial fibrillation: Secondary | ICD-10-CM | POA: Diagnosis not present

## 2023-01-28 DIAGNOSIS — Z7189 Other specified counseling: Secondary | ICD-10-CM | POA: Insufficient documentation

## 2023-01-28 MED ORDER — METOPROLOL SUCCINATE ER 50 MG PO TB24: 50.0000 mg | ORAL_TABLET | Freq: Every day | ORAL | 0 refills | Status: DC

## 2023-01-28 MED ORDER — MEMANTINE HCL 10 MG PO TABS: 20.0000 mg | ORAL_TABLET | Freq: Two times a day (BID) | ORAL | Status: DC

## 2023-01-28 MED ORDER — CITALOPRAM HYDROBROMIDE 20 MG PO TABS: 20.0000 mg | ORAL_TABLET | Freq: Two times a day (BID) | ORAL | Status: DC

## 2023-01-28 NOTE — Assessment & Plan Note (Signed)
-   Continue Eliquis 5mg  BID - She would probably benefit from statin therapy to reduce further stroke risk, but is also trying to take less medicine. Will not start statin today, continue to weight risks & benefits at each visit

## 2023-01-28 NOTE — Assessment & Plan Note (Signed)
-   She is in Afib today, she is asymptomatic - She is bradycardic today, HR 46 on intake, 60 on my physical exam.  - Decrease Metoprolol XL from 50mg  BID to 50mg  daily - Continue Eliquis 5mg  BID

## 2023-01-28 NOTE — Assessment & Plan Note (Signed)
-   Patient had ER/PR+ breast cancer diagnosed in 2018, underwent right partial mastectomy, and was prescribed anastrazole - Her daughter did not bring her medicine list in today, so I still need to clarify if she is currently taking Anastrozole and if she's regularly following with Oncology

## 2023-01-28 NOTE — Assessment & Plan Note (Signed)
-   We discussed repeating DEXA scan today, but patient declined - I will encourage calcium & vitamin D intake. Could check Vitamin D levels next time we get labs and replete if low

## 2023-01-28 NOTE — Assessment & Plan Note (Signed)
-   LDL 100 on last check 2 years ago, she is not on a statin - At next visit, can discuss risk/benefit of repeating lipid panel with possible statin addition

## 2023-01-28 NOTE — Patient Instructions (Signed)

## 2023-01-28 NOTE — Assessment & Plan Note (Signed)
-   Blood pressure is well controlled today even though she has not been taking Losartan or Spironolactone. She previously had low readings on these medicines with home health - Stop Losartan & Spironolactone

## 2023-01-28 NOTE — Patient Instructions (Addendum)
Dear Ms Fay,  It was a pleasure seeing you in clinic today.   I have stopped your Losartan & Spironolactone. You don't need to take these medicines.   I am decreasing your Metoprolol to 50mg  once daily. The twice daily dose was too much.   I have placed a referral for Psychiatry to see if we can get you phone appointments.   We will work on getting you a nebulizer machine.  I'll see you back in 3 months - please call us if you need anything sooner.   Sincerely, Dr. Mercie Eon

## 2023-01-28 NOTE — Progress Notes (Signed)
Subjective:   Marisa Nunez is a 77 y.o. female who presents for Medicare Annual (Subsequent) preventive examination.  Visit Complete: In person   Cardiac Risk Factors include: advanced age (>70men, >80 women);hypertension     Objective:    Today's Vitals   01/28/23 1419  BP: 134/86  Pulse: (!) 46  Temp: 98.4 F (36.9 C)  TempSrc: Oral  SpO2: 98%  Weight: 115 lb 6.4 oz (52.3 kg)  Height: 5\' 4"  (1.626 m)  PainSc: 0-No pain   Body mass index is 19.81 kg/m.     01/28/2023    2:30 PM 01/28/2023    8:58 AM 12/25/2022    1:16 PM 11/24/2022    4:46 PM 06/30/2022   10:00 PM 06/30/2022   12:19 PM 06/14/2022   11:59 AM  Advanced Directives  Does Patient Have a Medical Advance Directive? No No No No No No No  Would patient like information on creating a medical advance directive? No - Patient declined No - Patient declined  No - Patient declined No - Patient declined No - Patient declined No - Patient declined    Current Medications (verified) Outpatient Encounter Medications as of 01/28/2023  Medication Sig   acetaminophen (TYLENOL) 500 MG tablet Take 1 tablet (500 mg total) by mouth every 6 (six) hours as needed for moderate pain, fever or mild pain.   albuterol (PROVENTIL) (2.5 MG/3ML) 0.083% nebulizer solution Take 3 mLs (2.5 mg total) by nebulization every 4 (four) hours as needed for shortness of breath or wheezing.   albuterol (VENTOLIN HFA) 108 (90 Base) MCG/ACT inhaler Inhale 2 puffs into the lungs every 6 (six) hours as needed for wheezing or shortness of breath.   apixaban (ELIQUIS) 5 MG TABS tablet TAKE 1 TABLET(5 MG) BY MOUTH TWICE DAILY (Patient taking differently: Take 5 mg by mouth 2 (two) times daily.)   citalopram (CELEXA) 20 MG tablet Take 1 tablet (20 mg total) by mouth in the morning and at bedtime.   divalproex (DEPAKOTE SPRINKLE) 125 MG capsule Take 2 capsules (250 mg total) by mouth 2 (two) times daily.   furosemide (LASIX) 20 MG tablet Take 2 tablets (40 mg  total) by mouth daily.   Melatonin 10 MG TABS Take 10 mg by mouth at bedtime.   memantine (NAMENDA) 10 MG tablet Take 2 tablets (20 mg total) by mouth 2 (two) times daily.   metoprolol succinate (TOPROL-XL) 50 MG 24 hr tablet Take 1 tablet (50 mg total) by mouth daily. Take with or immediately following a meal.   Multiple Vitamins-Minerals (MULTIVITAMIN WITH MINERALS) tablet Take 1 tablet by mouth daily with breakfast.   ondansetron (ZOFRAN-ODT) 4 MG disintegrating tablet Take 4 mg by mouth every 8 (eight) hours as needed for vomiting or nausea (dissolve orally).   potassium chloride (KLOR-CON M) 10 MEQ tablet Take 1 tablet (10 mEq total) by mouth 2 (two) times daily.   QUEtiapine (SEROQUEL) 50 MG tablet Take 1 tablet (50 mg total) by mouth 2 (two) times daily.   No facility-administered encounter medications on file as of 01/28/2023.    Allergies (verified) Influenza virus vaccine, Myrbetriq [mirabegron er], Pneumococcal vaccine, and Ativan [lorazepam]   History: Past Medical History:  Diagnosis Date   Anxiety    Breast cancer (HCC)    Chronic combined systolic and diastolic CHF (congestive heart failure) (HCC)    a. Echo 11/2021: LVEF of 40-45% with global hypokinesis, mildly reduced RV systolic function, and moderate MR   COPD (chronic obstructive  pulmonary disease) (HCC)    Dementia (HCC)    Depression    History of TIA 10/30/2020   History of tobacco use 05/06/2017   Formatting of this note might be different from the original.  Greater than 40 pack year history. Quit in 2015.     HTN (hypertension)    Paroxysmal atrial fibrillation (HCC) 2017   Stroke [facility])    Past Surgical History:  Procedure Laterality Date   ABDOMINAL HYSTERECTOMY     CARDIOVERSION N/A 02/19/2022   Procedure: CARDIOVERSION;  Surgeon: Meriam Sprague, MD;  Location: [facility] ENDOSCOPY;  Service: Cardiovascular;  Laterality: N/A;   REVERSE SHOULDER ARTHROPLASTY Right 10/21/2021   Procedure: RIGHT REVERSE  TOTAL SHOULDER ARTHROPLASTY;  Surgeon: Bjorn Pippin, MD;  Location: [facility] OR;  Service: Orthopedics;  Laterality: Right;  allen bed, spider, tornier (rep aware), open shoulder tray.   Family History  Problem Relation Age of Onset   Arrhythmia Father    Social History   Socioeconomic History   Marital status: Single    Spouse name: Not on file   Number of children: Not on file   Years of education: Not on file   Highest education level: Not on file  Occupational History   Not on file  Tobacco Use   Smoking status: Former    Current packs/day: 0.50    Types: Cigarettes   Smokeless tobacco: Not on file  Vaping Use   Vaping status: Never Used  Substance and Sexual Activity   Alcohol use: Not Currently    Alcohol/week: 6.0 standard drinks of alcohol    Types: 6 Cans of beer per week   Drug use: Not Currently   Sexual activity: Yes  Other Topics Concern   Not on file  Social History Narrative   Not on file   Social Determinants of Health   Financial Resource Strain: Low Risk  (01/28/2023)   Overall Financial Resource Strain (CARDIA)    Difficulty of Paying Living Expenses: Not hard at all  Food Insecurity: No Food Insecurity (01/28/2023)   Hunger Vital Sign    Worried About Running Out of Food in the Last Year: Never true    Ran Out of Food in the Last Year: Never true  Transportation Needs: No Transportation Needs (01/28/2023)   PRAPARE - Administrator, Civil Service (Medical): No    Lack of Transportation (Non-Medical): No  Physical Activity: Sufficiently Active (01/28/2023)   Exercise Vital Sign    Days of Exercise per Week: 7 days    Minutes of Exercise per Session: 50 min  Stress: No Stress Concern Present (01/28/2023)   [employer] of Occupational Health - Occupational Stress Questionnaire    Feeling of Stress : Not at all  Social Connections: Unknown (01/28/2023)   Social Connection and Isolation Panel [NHANES]    Frequency of Communication with  Friends and Family: Three times a week    Frequency of Social Gatherings with Friends and Family: More than three times a week    Attends Religious Services: Never    Database administrator or Organizations: Patient declined    Attends Engineer, structural: Patient declined    Marital Status: Divorced    Tobacco Counseling Counseling given: Not Answered   Clinical Intake:  Pre-visit preparation completed: Yes  Pain : No/denies pain Pain Score: 0-No pain     BMI - recorded: 19 Nutritional Status: BMI of 19-24  Normal Nutritional Risks: None Diabetes: No  How  often do you need to have someone help you when you read instructions, pamphlets, or other written materials from your doctor or pharmacy?: 3 - Sometimes What is the last grade level you completed in school?: college  Interpreter Needed?: No  Information entered by :: [facility] Otilia Kareem   Activities of Daily Living    01/28/2023    2:22 PM 01/28/2023    8:57 AM  In your present state of health, do you have any difficulty performing the following activities:  Hearing? 0 0  Vision? 0 0  Difficulty concentrating or making decisions? 1 1  Walking or climbing stairs? 1 1  Dressing or bathing? 0 0  Doing errands, shopping? 0 0  Preparing Food and eating ? N   Using the Toilet? N   In the past six months, have you accidently leaked urine? N   Do you have problems with loss of bowel control? N   Managing your Medications? N   Managing your Finances? N   Housekeeping or managing your Housekeeping? N     Patient Care Team: Mercie Eon, MD as PCP - General (Internal Medicine) Orbie Pyo, MD as PCP - Cardiology (Cardiology) Corrin Parker, PA-C as Physician Assistant (Cardiology)  Indicate any recent Medical Services you may have received from other than [facility] providers in the past year (date may be approximate).     Assessment:   This is a routine wellness examination for Marilou.  Hearing/Vision  screen No results found.   Goals Addressed   None   Depression Screen    01/28/2023    9:01 AM 06/26/2022    1:49 PM 11/20/2021    5:04 PM 11/20/2021   10:53 AM 10/01/2021    8:47 AM 05/22/2021    9:27 AM 05/22/2021    9:26 AM  PHQ 2/9 Scores  PHQ - 2 Score 0 0 0 0 0 0 0  PHQ- 9 Score      4     Fall Risk    01/28/2023    2:31 PM 01/28/2023    8:57 AM 06/26/2022    1:49 PM 11/20/2021   10:55 AM 11/20/2021    9:57 AM  Fall Risk   Falls in the past year? 1 1 1 1  0  Number falls in past yr: 0 0 1 1 0  Injury with Fall? 1 1 1 1 1   Risk for fall due to : Impaired balance/gait Impaired balance/gait     Follow up Falls prevention discussed;Falls evaluation completed Falls evaluation completed Falls evaluation completed;Falls prevention discussed Falls evaluation completed;Falls prevention discussed Falls evaluation completed    MEDICARE RISK AT HOME: Medicare Risk at Home If so, are there any without handrails?: Yes Home free of loose throw rugs in walkways, pet beds, electrical cords, etc?: No Adequate lighting in your home to reduce risk of falls?: Yes Life alert?: No Use of a cane, walker or w/c?: No Grab bars in the bathroom?: No Shower chair or bench in shower?: No Elevated toilet seat or a handicapped toilet?: No  TIMED UP AND GO:  Was the test performed?  N/A    Cognitive Function:        11/20/2021   10:56 AM  6CIT Screen  What Year? 0 points  What month? 3 points  What time? 0 points  Count back from 20 0 points  Months in reverse 0 points  Repeat phrase 0 points  Total Score 3 points    Immunizations  There  is no immunization history on file for this patient.  TDAP status: Due, Education has been provided regarding the importance of this vaccine. Advised may receive this vaccine at local pharmacy or Health Dept. Aware to provide a copy of the vaccination record if obtained from local pharmacy or Health Dept. Verbalized acceptance and understanding.  Flu  Vaccine status: Due, Education has been provided regarding the importance of this vaccine. Advised may receive this vaccine at local pharmacy or Health Dept. Aware to provide a copy of the vaccination record if obtained from local pharmacy or Health Dept. Verbalized acceptance and understanding.  Pneumococcal vaccine status: Declined,  Education has been provided regarding the importance of this vaccine but patient still declined. Advised may receive this vaccine at local pharmacy or Health Dept. Aware to provide a copy of the vaccination record if obtained from local pharmacy or Health Dept. Verbalized acceptance and understanding.   Covid-19 vaccine status: Declined, Education has been provided regarding the importance of this vaccine but patient still declined. Advised may receive this vaccine at local pharmacy or Health Dept.or vaccine clinic. Aware to provide a copy of the vaccination record if obtained from local pharmacy or Health Dept. Verbalized acceptance and understanding.  Qualifies for Shingles Vaccine? Yes   Zostavax completed No   Shingrix Completed?: No.    Education has been provided regarding the importance of this vaccine. Patient has been advised to call insurance company to determine out of pocket expense if they have not yet received this vaccine. Advised may also receive vaccine at local pharmacy or Health Dept. Verbalized acceptance and understanding.  Screening Tests Health Maintenance  Topic Date Due   Hepatitis C Screening  Never done   DTaP/Tdap/Td (1 - Tdap) Never done   Zoster Vaccines- Shingrix (1 of 2) Never done   COVID-19 Vaccine (1 - 2023-24 season) Never done   INFLUENZA VACCINE  07/27/2023 (Originally 11/27/2022)   Pneumonia Vaccine 18+ Years old (1 of 1 - PCV) 01/28/2024 (Originally 01/18/2013)   DEXA SCAN  01/28/2024 (Originally 01/18/2013)   Lung Cancer Screening  04/07/2023   Medicare Annual Wellness (AWV)  01/28/2024   HPV VACCINES  Aged Out   Colonoscopy   Discontinued    Health Maintenance  Health Maintenance Due  Topic Date Due   Hepatitis C Screening  Never done   DTaP/Tdap/Td (1 - Tdap) Never done   Zoster Vaccines- Shingrix (1 of 2) Never done   COVID-19 Vaccine (1 - 2023-24 season) Never done    Colorectal cancer screening: No longer required.    Bone Density status:  deferred  to pcp    Lung Cancer Screening: (Low Dose CT Chest recommended if Age 30-80 years, 20 pack-year currently smoking OR have quit w/in 15years.) does not qualify.   Lung Cancer Screening Referral: DEFERRED TO PCP   Additional Screening:   Vision Screening: Recommended annual ophthalmology exams for early detection of glaucoma and other disorders of the eye. Is the patient up to date with their annual eye exam?  Yes  Who is the provider or what is the name of the office in which the patient attends annual eye exams? [facility] If pt is not established with a provider, would they like to be referred to a provider to establish care? No .   Dental Screening: Recommended annual dental exams for proper oral hygiene  Diabetic Foot Exam: N/A  Community Resource Referral / Chronic Care Management: CRR required this visit?  No   CCM  required this visit?  No     Plan:     I have personally reviewed and noted the following in the patient's chart:   Medical and social history Use of alcohol, tobacco or illicit drugs  Current medications and supplements including opioid prescriptions. Patient is not currently taking opioid prescriptions. Functional ability and status Nutritional status Physical activity Advanced directives List of other physicians Hospitalizations, surgeries, and ER visits in previous 12 months Vitals Screenings to include cognitive, depression, and falls Referrals and appointments  In addition, I have reviewed and discussed with patient certain preventive protocols, quality metrics, and best practice recommendations. A  written personalized care plan for preventive services as well as general preventive health recommendations were provided to patient.     Derrell Lolling, CMA   01/28/2023   After Visit Summary: (In Person-Declined) Patient declined AVS at this time.  Nurse Notes: IN PERSON [facility]   Ms. Reigel , Thank you for taking time to come for your Medicare Wellness Visit. I appreciate your ongoing commitment to your health goals. Please review the following plan we discussed and let me know if I can assist you in the future.   These are the goals we discussed:  Goals   None     This is a list of the screening recommended for you and due dates:  Health Maintenance  Topic Date Due   Hepatitis C Screening  Never done   DTaP/Tdap/Td vaccine (1 - Tdap) Never done   Zoster (Shingles) Vaccine (1 of 2) Never done   COVID-19 Vaccine (1 - 2023-24 season) Never done   Flu Shot  07/27/2023*   Pneumonia Vaccine (1 of 1 - PCV) 01/28/2024*   DEXA scan (bone density measurement)  01/28/2024*   Screening for Lung Cancer  04/07/2023   Medicare Annual Wellness Visit  01/28/2024   HPV Vaccine  Aged Out   Colon Cancer Screening  Discontinued  *Topic was postponed. The date shown is not the original due date.

## 2023-01-28 NOTE — Assessment & Plan Note (Signed)
-   She sees Dr. Eugenio Hoes with Neurology and the ER also recommended that she see a Geriatric Psychiatrist - I have placed Geri Psych referral today, patient & daughter requesting telemed appointments - For now, continue current regimen of Seroquel 50mg , Namenda 20mg  BID, Melatonin 10mg  at bedtime, Depakote sprinkles 250mg  BID, and Celexa 20mg  BID - I do not recommend Celexa >20 mg daily as a long-term option in this 77yo woman, so need Psychiatry's assistance with this. Consult placed

## 2023-01-28 NOTE — Assessment & Plan Note (Addendum)
-   She is in Afib today, she is asymptomatic - She is bradycardic today, HR 46 on intake, 60 on my physical exam.  - Decrease Metoprolol XL from 50mg  BID to 50mg  daily - Continue Eliquis 5mg  BID

## 2023-01-28 NOTE — Assessment & Plan Note (Signed)
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 She has a history of right sided breast cancer, which increases her risk of occurrence, but driving to appointments and getting a mammogram were very bothersome to patient. Her daughter understands the risks & benefits of this choice

## 2023-02-02 ENCOUNTER — Other Ambulatory Visit: Payer: Self-pay | Admitting: Internal Medicine

## 2023-02-06 ENCOUNTER — Other Ambulatory Visit: Payer: Self-pay | Admitting: Internal Medicine

## 2023-02-06 NOTE — Telephone Encounter (Signed)
citalopram (CELEXA) 20 MG tablet Medication   [facility] #[id number] [place] Otto, [place] - [address] AT [facility] OF [facility] & [address] (Ph: [phone])

## 2023-02-09 MED ORDER — CITALOPRAM HYDROBROMIDE 20 MG PO TABS: 20.0000 mg | ORAL_TABLET | Freq: Two times a day (BID) | ORAL | Status: DC

## 2023-02-09 NOTE — Telephone Encounter (Signed)
Pt's Daughter calling back to state he mother is now completely out of the following following medication.  citalopram (CELEXA) 20 MG tablet    [facility] #[id number] [place] Otto, [place] - [address] AT [facility] OF [facility] & [address] (Ph: [phone])

## 2023-02-11 ENCOUNTER — Telehealth: Payer: Self-pay | Admitting: Internal Medicine

## 2023-02-11 MED ORDER — CITALOPRAM HYDROBROMIDE 20 MG PO TABS: 20.0000 mg | ORAL_TABLET | Freq: Two times a day (BID) | ORAL | 0 refills | Status: DC

## 2023-02-11 NOTE — Telephone Encounter (Signed)
Spoke to Dr. Lafonda Mosses and prescription was resent to the [facility].  [name] was called and informed that prescription has been resent to the Pharmacy.

## 2023-02-11 NOTE — Telephone Encounter (Signed)
Pt's daughter is calling back to state she has contacted the pharmacy again and the following medication still has not be filled.   citalopram (CELEXA) 20 MG tablet  citalopram (CELEXA) 20 MG tablet 180 tablet -- 02/09/2023 05/10/2023   Sig - Route: Take 1 tablet (20 mg total) by mouth in the morning and at bedtime. - Oral   Class: No Print    [facility] #[id number] Ginette Otto, [place] - [address] AT [facility] OF [facility] & [address] (Ph: [phone])

## 2023-03-02 ENCOUNTER — Other Ambulatory Visit: Payer: Self-pay

## 2023-03-03 MED ORDER — DIVALPROEX SODIUM 125 MG PO CSDR: 250.0000 mg | DELAYED_RELEASE_CAPSULE | Freq: Two times a day (BID) | ORAL | 0 refills | Status: DC

## 2023-04-06 ENCOUNTER — Other Ambulatory Visit: Payer: Self-pay

## 2023-04-06 MED ORDER — DIVALPROEX SODIUM 125 MG PO CSDR: 250.0000 mg | DELAYED_RELEASE_CAPSULE | Freq: Two times a day (BID) | ORAL | 0 refills | Status: AC

## 2023-05-04 ENCOUNTER — Telehealth: Payer: Self-pay | Admitting: *Deleted

## 2023-05-04 ENCOUNTER — Ambulatory Visit
Admission: EM | Admit: 2023-05-04 | Discharge: 2023-05-04 | Disposition: A | Discharge status: Home / Self Care | Payer: Medicare Other | Attending: Family Medicine | Admitting: Family Medicine

## 2023-05-04 DIAGNOSIS — R051 Acute cough: Secondary | ICD-10-CM

## 2023-05-04 DIAGNOSIS — J069 Acute upper respiratory infection, unspecified: Secondary | ICD-10-CM

## 2023-05-04 LAB — POCT INFLUENZA A/B
Influenza A, POC: NEGATIVE
Influenza B, POC: NEGATIVE

## 2023-05-04 MED ORDER — BENZONATATE 100 MG PO CAPS
100.0000 mg | ORAL_CAPSULE | Freq: Three times a day (TID) | ORAL | 0 refills | Status: AC
Start: 2023-05-04 — End: ?

## 2023-05-04 MED ORDER — ALBUTEROL SULFATE HFA 108 (90 BASE) MCG/ACT IN AERS
1.0000 | INHALATION_SPRAY | Freq: Four times a day (QID) | RESPIRATORY_TRACT | 0 refills | Status: AC | PRN
Start: 2023-05-04 — End: ?

## 2023-05-04 NOTE — Telephone Encounter (Signed)
 Call from pt's daughter, [name], calling for her mother for an appt today. She stated pt c/o dry cough,sob,nausea; no fever nor CP. Pt has not taken any OTC med. Stated they called EMS on Saturday- sat 98%, lungs were clear, and no fever. No available appts today nor this week at this time. Pt's daughter was advised to take pt to UC - stated stated she understands.

## 2023-05-04 NOTE — ED Triage Notes (Signed)
 Pt presents to UC w/ c/o productive cough and trouble breathing, nausea x3 days. Pt was taken to ER 3 days ago for SOB but was sent home. Home inhaler does not help

## 2023-05-04 NOTE — ED Provider Notes (Signed)
 [facility]    CSN: [id number] Arrival date & time: 05/04/23  1408      History   Chief Complaint No chief complaint on file.   HPI Marisa Nunez is a 78 y.o. female  presents for evaluation of URI symptoms for 3 days.  Patient is accompanied by daughter who is primary caregiver.  Patient does have dementia and daughter helps to's supplement history.  Patient reports associated symptoms of dry cough with nausea and shortness of breath. Denies vomiting, diarrhea, fevers, sore throat, ear pain, body aches. Patient does not have a hx of asthma. Patient is not an active smoker.   Reports no sick contacts.  Daughter states that they had to call EMS a couple nights ago she woke up complaining she could not breathe.  States she was evaluated by EMS who reports her vitals were stable and he did not recommend hospital evaluation.  Daughter has been giving her Zofran  for the nausea.  Pt has taken not OTC for symptoms. Pt has no other concerns at this time.   HPI  Past Medical History:  Diagnosis Date   Anxiety    Breast cancer (HCC)    Chronic combined systolic and diastolic CHF (congestive heart failure) (HCC)    a. Echo 11/2021: LVEF of 40-45% with global hypokinesis, mildly reduced RV systolic function, and moderate MR   COPD (chronic obstructive pulmonary disease) (HCC)    Dementia (HCC)    Depression    History of TIA 10/30/2020   History of tobacco use 05/06/2017   Formatting of this note might be different from the original.  Greater than 40 pack year history. Quit in 2015.     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/2017    Essential hypertension 10/25/2015   Persistent atrial fibrillation (HCC) 10/25/2015    Past Surgical History:  Procedure Laterality Date   ABDOMINAL HYSTERECTOMY     CARDIOVERSION N/A 02/19/2022   Procedure: CARDIOVERSION;  Surgeon: Hobart Powell BRAVO, MD;  Location: [facility] ENDOSCOPY;  Service: Cardiovascular;  Laterality: N/A;   REVERSE SHOULDER ARTHROPLASTY Right 10/21/2021   Procedure: RIGHT REVERSE TOTAL SHOULDER ARTHROPLASTY;  Surgeon: Cristy Bonner DASEN, MD;  Location: [facility] OR;  Service: Orthopedics;  Laterality: Right;  allen bed, spider, tornier (rep aware), open shoulder tray.    OB History   No obstetric history on file.      Home Medications    Prior to Admission medications   Medication Sig Start Date End Date Taking? Authorizing Provider  albuterol  (VENTOLIN  HFA) 108 (90 Base) MCG/ACT inhaler Inhale 1-2 puffs into the lungs every 6 (six) hours as needed. 05/04/23  Yes Francene Mcerlean, Jodi R, NP  benzonatate  (TESSALON ) 100 MG capsule Take 1 capsule (100 mg total) by mouth every 8 (eight) hours. 05/04/23  Yes Naydene Kamrowski, Jodi R, NP  acetaminophen  (TYLENOL ) 500 MG tablet Take 1 tablet (500 mg total) by mouth every 6 (six) hours as needed for moderate pain, fever or mild pain. 10/24/21   Pokhrel, Vernal, MD  albuterol  (PROVENTIL ) (2.5 MG/3ML) 0.083% nebulizer solution Take 3 mLs (2.5 mg total) by nebulization every 4 (four) hours as needed for  shortness of breath or wheezing. 12/29/22   Onuoha, Josephine C, NP  apixaban  (ELIQUIS ) 5 MG TABS tablet TAKE 1 TABLET(5 MG) BY MOUTH TWICE DAILY Patient taking differently: Take 5 mg by mouth 2 (two) times daily. 02/28/22   Dow Arland BROCKS, NP  citalopram  (CELEXA ) 20 MG tablet Take 1 tablet (20 mg total) by mouth in the morning and at bedtime. 02/11/23 05/12/23  Lovie Clarity, MD  divalproex  (DEPAKOTE  SPRINKLE) 125 MG capsule Take 2 capsules (250 mg total) by mouth 2 (two) times daily. 04/06/23 05/06/23  Lovie Clarity, MD  furosemide  (LASIX ) 20 MG tablet Take 2 tablets  (40 mg total) by mouth daily. 01/20/23   Lovie Clarity, MD  Melatonin 10 MG TABS Take 10 mg by mouth at bedtime.    [provider]  memantine  (NAMENDA ) 10 MG tablet Take 2 tablets (20 mg total) by mouth 2 (two) times daily. 01/28/23 01/28/24  Lovie Clarity, MD  metoprolol  succinate (TOPROL -XL) 50 MG 24 hr tablet Take 1 tablet (50 mg total) by mouth daily. Take with or immediately following a meal. 01/28/23 02/27/23  Lovie Clarity, MD  Multiple Vitamins-Minerals (MULTIVITAMIN WITH MINERALS) tablet Take 1 tablet by mouth daily with breakfast.    [provider]  ondansetron  (ZOFRAN -ODT) 4 MG disintegrating tablet Take 4 mg by mouth every 8 (eight) hours as needed for vomiting or nausea (dissolve orally). 06/29/22   [provider]  potassium chloride  (KLOR-CON  M) 10 MEQ tablet TAKE 1 TABLET(10 MEQ) BY MOUTH TWICE DAILY 02/02/23   Lovie Clarity, MD  QUEtiapine  (SEROQUEL ) 50 MG tablet Take 1 tablet (50 mg total) by mouth 2 (two) times daily. 12/29/22 01/28/23  Alan Gailen BROCKS, NP    Family History Family History  Problem Relation Age of Onset   Arrhythmia Father     Social History Social History   Tobacco Use   Smoking status: Former    Current packs/day: 0.50    Types: Cigarettes  Vaping Use   Vaping status: Never Used  Substance Use Topics   Alcohol use: Not Currently    Alcohol/week: 6.0 standard drinks of alcohol    Types: 6 Cans of beer per week   Drug use: Not Currently     Allergies   Influenza virus vaccine, Myrbetriq  [mirabegron  er], Pneumococcal vaccine, and Ativan  [lorazepam ]   Review of Systems Review of Systems  HENT:  Positive for congestion.   Respiratory:  Positive for cough and shortness of breath.   Gastrointestinal:  Positive for nausea.     Physical Exam Triage Vital Signs ED Triage Vitals  Encounter Vitals Group     BP 05/04/23 1517 (!) 141/98     Systolic BP Percentile --      Diastolic BP Percentile --      Pulse Rate  05/04/23 1517 (!) 107     Resp 05/04/23 1517 20     Temp 05/04/23 1517 97.7 F (36.5 C)     Temp Source 05/04/23 1517 Oral     SpO2 05/04/23 1517 95 %     Weight --      Height --      Head Circumference --      Peak Flow --      Pain Score 05/04/23 1514 0     Pain Loc --      Pain Education --      Exclude from Growth Chart --    No data found.  Updated Vital Signs BP (!) 141/98 (BP Location: Left  Arm)   Pulse (!) 107   Temp 97.7 F (36.5 C) (Oral)   Resp 20   LMP  (LMP Unknown)   SpO2 95%   Visual Acuity Right Eye Distance:   Left Eye Distance:   Bilateral Distance:    Right Eye Near:   Left Eye Near:    Bilateral Near:     Physical Exam Vitals and nursing note reviewed.  Constitutional:      General: She is not in acute distress.    Appearance: She is well-developed. She is not ill-appearing.  HENT:     Head: Normocephalic and atraumatic.     Right Ear: Tympanic membrane and ear canal normal.     Left Ear: Tympanic membrane and ear canal normal.     Nose: Congestion present.     Mouth/Throat:     Mouth: Mucous membranes are moist.     Pharynx: Oropharynx is clear. Uvula midline. No oropharyngeal exudate or posterior oropharyngeal erythema.     Tonsils: No tonsillar exudate or tonsillar abscesses.  Eyes:     Conjunctiva/sclera: Conjunctivae normal.     Pupils: Pupils are equal, round, and reactive to light.  Cardiovascular:     Rate and Rhythm: Regular rhythm. Tachycardia present.     Heart sounds: Normal heart sounds.     Comments: Mildly tachycardia 107 Pulmonary:     Effort: Pulmonary effort is normal. No respiratory distress.     Breath sounds: Normal breath sounds. No stridor. No wheezing, rhonchi or rales.  Musculoskeletal:     Cervical back: Normal range of motion and neck supple.  Lymphadenopathy:     Cervical: No cervical adenopathy.  Skin:    General: Skin is warm and dry.  Neurological:     General: No focal deficit present.     Mental  Status: She is alert and oriented to person, place, and time.  Psychiatric:        Mood and Affect: Mood normal.        Behavior: Behavior normal.      UC Treatments / Results  Labs (all labs ordered are listed, but only abnormal results are displayed) Labs Reviewed  SARS CORONAVIRUS 2 (TAT 6-24 HRS)  POCT INFLUENZA A/B    EKG   Radiology No results found.  Procedures Procedures (including critical care time)  Medications Ordered in UC Medications - No data to display  Initial Impression / Assessment and Plan / UC Course  I have reviewed the triage vital signs and the nursing notes.  Pertinent labs & imaging results that were available during my care of the patient were reviewed by me and considered in my medical decision making (see chart for details).     Reviewed exam and symptoms with patient and daughter.  No red flags.  She is well-appearing and in no acute distress.  COVID PCR and will contact if positive.  Negative rapid flu.  Discussed viral illness and symptomatic treatment.  Refilled albuterol  inhaler as needed for shortness of breath.  Tessalon  as needed for cough.  PCP follow-up 2 days for recheck.  ER precautions reviewed and daughter verbalized understanding. Final Clinical Impressions(s) / UC Diagnoses   Final diagnoses:  Acute cough  Viral upper respiratory illness     Discharge Instructions      The clinical contact you with results of the COVID test if positive.  Start Tessalon  as needed for cough.  You may use albuterol  inhaler as needed for shortness of breath.  Lots  of rest and fluids.  Please follow-up with your PCP 2 days for recheck.  Please go to the ER for any worsening symptoms.  I hope you feel better soon!     ED Prescriptions     Medication Sig Dispense Auth. Provider   benzonatate  (TESSALON ) 100 MG capsule Take 1 capsule (100 mg total) by mouth every 8 (eight) hours. 21 capsule Janautica Netzley, Jodi R, NP   albuterol  (VENTOLIN  HFA) 108 (90  Base) MCG/ACT inhaler Inhale 1-2 puffs into the lungs every 6 (six) hours as needed. 1 each Loreda Myla SAUNDERS, NP      PDMP not reviewed this encounter.   Loreda Myla SAUNDERS, NP 05/04/23 [phone]

## 2023-05-04 NOTE — Discharge Instructions (Addendum)
 The clinical contact you with results of the COVID test if positive.  Start Tessalon  as needed for cough.  You may use albuterol  inhaler as needed for shortness of breath.  Lots of rest and fluids.  Please follow-up with your PCP 2 days for recheck.  Please go to the ER for any worsening symptoms.  I hope you feel better soon!

## 2023-05-05 LAB — SARS CORONAVIRUS 2 (TAT 6-24 HRS): SARS Coronavirus 2: NEGATIVE

## 2023-05-05 NOTE — Telephone Encounter (Addendum)
 Per chart, daughter took her mother to UC yesterday.

## 2023-05-06 ENCOUNTER — Emergency Department (HOSPITAL_COMMUNITY)
Admission: EM | Admit: 2023-05-06 | Discharge: 2023-05-07 | Disposition: A | Discharge status: Home / Self Care | Payer: Medicare Other | Attending: Emergency Medicine | Admitting: Emergency Medicine

## 2023-05-06 DIAGNOSIS — Z20822 Contact with and (suspected) exposure to covid-19: Secondary | ICD-10-CM | POA: Insufficient documentation

## 2023-05-06 DIAGNOSIS — J811 Chronic pulmonary edema: Secondary | ICD-10-CM | POA: Diagnosis not present

## 2023-05-06 DIAGNOSIS — R062 Wheezing: Secondary | ICD-10-CM | POA: Diagnosis not present

## 2023-05-06 DIAGNOSIS — J90 Pleural effusion, not elsewhere classified: Secondary | ICD-10-CM | POA: Diagnosis not present

## 2023-05-06 DIAGNOSIS — Z7901 Long term (current) use of anticoagulants: Secondary | ICD-10-CM | POA: Diagnosis not present

## 2023-05-06 DIAGNOSIS — F0390 Unspecified dementia without behavioral disturbance: Secondary | ICD-10-CM | POA: Insufficient documentation

## 2023-05-06 DIAGNOSIS — D649 Anemia, unspecified: Secondary | ICD-10-CM | POA: Insufficient documentation

## 2023-05-06 DIAGNOSIS — I499 Cardiac arrhythmia, unspecified: Secondary | ICD-10-CM | POA: Diagnosis not present

## 2023-05-06 DIAGNOSIS — J449 Chronic obstructive pulmonary disease, unspecified: Secondary | ICD-10-CM | POA: Insufficient documentation

## 2023-05-06 DIAGNOSIS — R0902 Hypoxemia: Secondary | ICD-10-CM | POA: Diagnosis not present

## 2023-05-06 DIAGNOSIS — R918 Other nonspecific abnormal finding of lung field: Secondary | ICD-10-CM | POA: Diagnosis not present

## 2023-05-06 DIAGNOSIS — I10 Essential (primary) hypertension: Secondary | ICD-10-CM | POA: Insufficient documentation

## 2023-05-06 DIAGNOSIS — Z79899 Other long term (current) drug therapy: Secondary | ICD-10-CM | POA: Diagnosis not present

## 2023-05-06 DIAGNOSIS — R0989 Other specified symptoms and signs involving the circulatory and respiratory systems: Secondary | ICD-10-CM | POA: Diagnosis not present

## 2023-05-06 DIAGNOSIS — R000 Tachycardia, unspecified: Secondary | ICD-10-CM | POA: Diagnosis not present

## 2023-05-06 DIAGNOSIS — I4891 Unspecified atrial fibrillation: Secondary | ICD-10-CM | POA: Diagnosis not present

## 2023-05-06 DIAGNOSIS — R0602 Shortness of breath: Secondary | ICD-10-CM | POA: Diagnosis not present

## 2023-05-06 NOTE — ED Triage Notes (Signed)
 Pt here for ShOB that woke her from her sleep. Per family on scene, pt has been using her rescue inhaler throughout the day. Hx COPD and asthma. HR a-fib 160-180, given 10mg  of Cardizem  HR improved to 80-100. Found to be 90% RA with EMS, improved to 97% RA after duoneb. Hx of dementia.

## 2023-05-07 ENCOUNTER — Encounter (HOSPITAL_COMMUNITY): Payer: Self-pay | Admitting: Emergency Medicine

## 2023-05-07 ENCOUNTER — Other Ambulatory Visit: Payer: Self-pay

## 2023-05-07 ENCOUNTER — Other Ambulatory Visit: Payer: Self-pay | Admitting: Internal Medicine

## 2023-05-07 ENCOUNTER — Emergency Department (HOSPITAL_COMMUNITY): Payer: Medicare Other

## 2023-05-07 DIAGNOSIS — J90 Pleural effusion, not elsewhere classified: Secondary | ICD-10-CM | POA: Diagnosis not present

## 2023-05-07 DIAGNOSIS — R0989 Other specified symptoms and signs involving the circulatory and respiratory systems: Secondary | ICD-10-CM | POA: Diagnosis not present

## 2023-05-07 DIAGNOSIS — J811 Chronic pulmonary edema: Secondary | ICD-10-CM | POA: Diagnosis not present

## 2023-05-07 DIAGNOSIS — R918 Other nonspecific abnormal finding of lung field: Secondary | ICD-10-CM | POA: Diagnosis not present

## 2023-05-07 LAB — CBC
HCT: 35.5 % — ABNORMAL LOW (ref 36.0–46.0)
Hemoglobin: 11.2 g/dL — ABNORMAL LOW (ref 12.0–15.0)
MCH: 27.6 pg (ref 26.0–34.0)
MCHC: 31.5 g/dL (ref 30.0–36.0)
MCV: 87.4 fL (ref 80.0–100.0)
Platelets: 314 10*3/uL (ref 150–400)
RBC: 4.06 MIL/uL (ref 3.87–5.11)
RDW: 14.4 % (ref 11.5–15.5)
WBC: 9.5 10*3/uL (ref 4.0–10.5)
nRBC: 0 % (ref 0.0–0.2)

## 2023-05-07 LAB — BRAIN NATRIURETIC PEPTIDE: B Natriuretic Peptide: 1462 pg/mL — ABNORMAL HIGH (ref 0.0–100.0)

## 2023-05-07 LAB — RESP PANEL BY RT-PCR (RSV, FLU A&B, COVID)  RVPGX2
Influenza A by PCR: NEGATIVE
Influenza B by PCR: NEGATIVE
Resp Syncytial Virus by PCR: NEGATIVE
SARS Coronavirus 2 by RT PCR: NEGATIVE

## 2023-05-07 LAB — TROPONIN I (HIGH SENSITIVITY)
Troponin I (High Sensitivity): 19 ng/L — ABNORMAL HIGH (ref <18)
Troponin I (High Sensitivity): 21 ng/L — ABNORMAL HIGH (ref <18)

## 2023-05-07 LAB — BASIC METABOLIC PANEL
Anion gap: 11 (ref 5–15)
BUN: 13 mg/dL (ref 8–23)
CO2: 25 mmol/L (ref 22–32)
Calcium: 8.6 mg/dL — ABNORMAL LOW (ref 8.9–10.3)
Chloride: 99 mmol/L (ref 98–111)
Creatinine, Ser: 0.66 mg/dL (ref 0.44–1.00)
GFR, Estimated: >60 mL/min (ref >60)
Glucose, Bld: 120 mg/dL — ABNORMAL HIGH (ref 70–99)
Potassium: 3.2 mmol/L — ABNORMAL LOW (ref 3.5–5.1)
Sodium: 135 mmol/L (ref 135–145)

## 2023-05-07 MED ORDER — ONDANSETRON HCL 4 MG/2ML IJ SOLN: 4.0000 mg | Freq: Once | INTRAMUSCULAR | Status: DC

## 2023-05-07 MED ORDER — IPRATROPIUM-ALBUTEROL 0.5-2.5 (3) MG/3ML IN SOLN
3.0000 mL | Freq: Once | RESPIRATORY_TRACT | Status: AC
  Administered 2023-05-07: 3 mL via RESPIRATORY_TRACT
  Filled 2023-05-07: qty 3

## 2023-05-07 MED ORDER — CITALOPRAM HYDROBROMIDE 20 MG PO TABS: 20.0000 mg | ORAL_TABLET | Freq: Two times a day (BID) | ORAL | 0 refills | Status: DC

## 2023-05-07 MED ORDER — FUROSEMIDE 10 MG/ML IJ SOLN
40.0000 mg | INTRAMUSCULAR | Status: AC
  Administered 2023-05-07: 40 mg via INTRAVENOUS
  Filled 2023-05-07: qty 4

## 2023-05-07 MED ORDER — METHYLPREDNISOLONE SODIUM SUCC 125 MG IJ SOLR
125.0000 mg | Freq: Once | INTRAMUSCULAR | Status: AC
  Administered 2023-05-07: 125 mg via INTRAVENOUS
  Filled 2023-05-07: qty 2

## 2023-05-07 NOTE — ED Provider Notes (Signed)
 [place] EMERGENCY DEPARTMENT AT [facility] Provider Note   CSN: [id number] Arrival date & time: 05/06/23  2353     History  Chief Complaint  Patient presents with   Shortness of Breath    Marisa Nunez is a 78 y.o. female.  The history is provided by the patient and medical records.  Shortness of Breath  78 y.o. F with history of dementia, hypertension, hyperlipidemia, persistent A-fib on Eliquis , presenting to the ED with shortness of breath.  Apparently woke from sleep and was complaining of this.  Seen at urgent care 2 days ago for URI with negative COVID and flu testing.  Family reported she has been using her inhaler throughout the day today.  Patient denies any significant cough, states it is mild.  She denies any chest pain.  She denies any fever.  No sick contacts.  Home Medications Prior to Admission medications   Medication Sig Start Date End Date Taking? Authorizing Provider  acetaminophen  (TYLENOL ) 500 MG tablet Take 1 tablet (500 mg total) by mouth every 6 (six) hours as needed for moderate pain, fever or mild pain. 10/24/21   Pokhrel, Vernal, MD  albuterol  (PROVENTIL ) (2.5 MG/3ML) 0.083% nebulizer solution Take 3 mLs (2.5 mg total) by nebulization every 4 (four) hours as needed for shortness of breath or wheezing. 12/29/22   Onuoha, Josephine C, NP  albuterol  (VENTOLIN  HFA) 108 (90 Base) MCG/ACT inhaler Inhale 1-2 puffs into the lungs every 6 (six) hours as needed. 05/04/23   Mayer, Jodi R, NP  apixaban  (ELIQUIS ) 5 MG TABS tablet TAKE 1 TABLET(5 MG) BY MOUTH TWICE DAILY Patient taking differently: Take 5 mg by mouth 2 (two) times daily. 02/28/22   Dow Arland BROCKS, NP  benzonatate  (TESSALON ) 100 MG capsule Take 1 capsule (100 mg total) by mouth every 8 (eight) hours. 05/04/23   Mayer, Jodi R, NP  citalopram  (CELEXA ) 20 MG tablet Take 1 tablet (20 mg total) by mouth in the morning and at bedtime. 02/11/23 05/12/23  Lovie Mliss, MD  divalproex  (DEPAKOTE  SPRINKLE)  125 MG capsule Take 2 capsules (250 mg total) by mouth 2 (two) times daily. 04/06/23 05/06/23  Lovie Mliss, MD  furosemide  (LASIX ) 20 MG tablet Take 2 tablets (40 mg total) by mouth daily. 01/20/23   Lovie Mliss, MD  Melatonin 10 MG TABS Take 10 mg by mouth at bedtime.    [provider]  memantine  (NAMENDA ) 10 MG tablet Take 2 tablets (20 mg total) by mouth 2 (two) times daily. 01/28/23 01/28/24  Lovie Mliss, MD  metoprolol  succinate (TOPROL -XL) 50 MG 24 hr tablet Take 1 tablet (50 mg total) by mouth daily. Take with or immediately following a meal. 01/28/23 02/27/23  Lovie Mliss, MD  Multiple Vitamins-Minerals (MULTIVITAMIN WITH MINERALS) tablet Take 1 tablet by mouth daily with breakfast.    [provider]  ondansetron  (ZOFRAN -ODT) 4 MG disintegrating tablet Take 4 mg by mouth every 8 (eight) hours as needed for vomiting or nausea (dissolve orally). 06/29/22   [provider]  potassium chloride  (KLOR-CON  M) 10 MEQ tablet TAKE 1 TABLET(10 MEQ) BY MOUTH TWICE DAILY 02/02/23   Lovie Mliss, MD  QUEtiapine  (SEROQUEL ) 50 MG tablet Take 1 tablet (50 mg total) by mouth 2 (two) times daily. 12/29/22 01/28/23  Onuoha, Josephine C, NP      Allergies    Influenza virus vaccine, Myrbetriq  [mirabegron  er], Pneumococcal vaccine, and Ativan  [lorazepam ]    Review of Systems   Review of Systems  Respiratory:  Positive for shortness of breath.   All other systems reviewed and are negative.   Physical Exam Updated Vital Signs BP (!) 142/77 (BP Location: Left Arm)   Pulse 92   Temp 98.7 F (37.1 C)   Resp 16   Ht 5' 4 (1.626 m)   Wt 52.2 kg   LMP  (LMP Unknown)   SpO2 92%   BMI 19.74 kg/m   Physical Exam Vitals and nursing note reviewed.  Constitutional:      Appearance: She is well-developed.  HENT:     Head: Normocephalic and atraumatic.  Eyes:     Conjunctiva/sclera: Conjunctivae normal.     Pupils: Pupils are equal, round, and reactive to light.  Cardiovascular:      Rate and Rhythm: Normal rate and regular rhythm.     Heart sounds: Normal heart sounds.  Pulmonary:     Effort: Pulmonary effort is normal.     Breath sounds: Wheezing present. No rhonchi.     Comments: Mild expiratory wheezes, no acute distress, able to speak in sentences without difficulty, sats 96% on room air Abdominal:     General: Bowel sounds are normal.     Palpations: Abdomen is soft.  Musculoskeletal:        General: Normal range of motion.     Cervical back: Normal range of motion.  Skin:    General: Skin is warm and dry.  Neurological:     Mental Status: She is alert and oriented to person, place, and time.     ED Results / Procedures / Treatments   Labs (all labs ordered are listed, but only abnormal results are displayed) Labs Reviewed  BASIC METABOLIC PANEL - Abnormal; Notable for the following components:      Result Value   Potassium 3.2 (*)    Glucose, Bld 120 (*)    Calcium  8.6 (*)    All other components within normal limits  CBC - Abnormal; Notable for the following components:   Hemoglobin 11.2 (*)    HCT 35.5 (*)    All other components within normal limits  BRAIN NATRIURETIC PEPTIDE - Abnormal; Notable for the following components:   B Natriuretic Peptide 1,462.0 (*)    All other components within normal limits  TROPONIN I (HIGH SENSITIVITY) - Abnormal; Notable for the following components:   Troponin I (High Sensitivity) 21 (*)    All other components within normal limits  TROPONIN I (HIGH SENSITIVITY) - Abnormal; Notable for the following components:   Troponin I (High Sensitivity) 19 (*)    All other components within normal limits  RESP PANEL BY RT-PCR (RSV, FLU A&B, COVID)  RVPGX2    EKG EKG Interpretation Date/Time:  Thursday May 07 2023 00:01:22 EST Ventricular Rate:  100 PR Interval:    QRS Duration:  74 QT Interval:  344 QTC Calculation: 443 R Axis:   45  Text Interpretation: Atrial fibrillation Anterior infarct , age  undetermined Abnormal ECG When compared with ECG of 12-Jan-2023 20:53, PREVIOUS ECG IS PRESENT Confirmed by Theadore Sharper [phone]) on 05/07/2023 2:53:46 AM  Radiology DG Chest 2 View Result Date: 05/07/2023 CLINICAL DATA:  Shortness of breath EXAM: CHEST - 2 VIEW COMPARISON:  01/12/2023 FINDINGS: Cardiac shadows within normal limits. Increased vascular congestion and edema is noted. Right basilar atelectasis and effusion is seen. No bony abnormality is noted. IMPRESSION: Changes consistent with congestive failure. Right effusion and basilar opacity is noted as well. Electronically Signed   By: Oneil Devonshire  M.D.   On: 05/07/2023 00:28    Procedures Procedures    Medications Ordered in ED Medications  ondansetron  (ZOFRAN ) injection 4 mg (0 mg Intravenous Hold 05/07/23 0506)  methylPREDNISolone  sodium succinate (SOLU-MEDROL ) 125 mg/2 mL injection 125 mg (125 mg Intravenous Given 05/07/23 0124)  ipratropium-albuterol  (DUONEB) 0.5-2.5 (3) MG/3ML nebulizer solution 3 mL (3 mLs Nebulization Given 05/07/23 0128)  furosemide  (LASIX ) injection 40 mg (40 mg Intravenous Given 05/07/23 9687)    ED Course/ Medical Decision Making/ A&P                                 Medical Decision Making Amount and/or Complexity of Data Reviewed Labs: ordered. Radiology: ordered and independent interpretation performed. ECG/medicine tests: ordered and independent interpretation performed.  Risk Prescription drug management.   78 y.o. F here with SOB.  Seen at UC 2 days ago for same.  Denies fever/chills.  Apparently had AFIB RVR en route, resolved with cardizem .  Hx of chronic AFIB, on eliquis .  She is afebrile, non-toxic.  VSS on RA.  She does have some wheezes on exam but in no acute respiratory distress.  Labs pending.  CXR with findings of CHF.   Will add on BNP.  Will get RVP.  Duoneb and solumedrol ordered.  Will reassess.  Labs as above--no leukocytosis, stable anemia.  Potassium is trivially low at 3.2.  BNP 1400.   Initial troponin mildly elevated at 21.  She is given dose of Lasix .  Will obtain delta troponin.  If this is decreasing or remaining flat, feel she will be stable for discharge.  4:39 AM Patient remains on RA without distress.  She has diuresed some here.  Delta troponin downtrending/flat at 19.  Feel she is stable for discharge.  Will have her continue home lasix .  Close follow-up with PCP encouraged.  Return here for new concerns.  Daughter called to pick patient up-- cannot drive at night.  Will be here in the AM to pick her up.  Final Clinical Impression(s) / ED Diagnoses Final diagnoses:  SOB (shortness of breath)    Rx / DC Orders ED Discharge Orders     None         Jarold Olam HERO, PA-C 05/07/23 0524    Theadore Ozell HERO, MD 05/07/23 [phone]

## 2023-05-07 NOTE — Discharge Instructions (Addendum)
 Continue your home lasix and using inhaler when needed. Follow-up with your primary care doctor. Return here for new concerns.

## 2023-05-08 ENCOUNTER — Encounter (HOSPITAL_COMMUNITY): Payer: Self-pay | Admitting: Internal Medicine

## 2023-05-08 ENCOUNTER — Observation Stay (HOSPITAL_COMMUNITY)
Admission: EM | Admit: 2023-05-08 | Discharge: 2023-05-10 | Disposition: A | Discharge status: Home / Self Care | Payer: Medicare Other | Attending: Internal Medicine | Admitting: Internal Medicine

## 2023-05-08 ENCOUNTER — Other Ambulatory Visit: Payer: Self-pay

## 2023-05-08 ENCOUNTER — Emergency Department (HOSPITAL_COMMUNITY): Payer: Medicare Other

## 2023-05-08 DIAGNOSIS — I4819 Other persistent atrial fibrillation: Secondary | ICD-10-CM | POA: Diagnosis present

## 2023-05-08 DIAGNOSIS — J90 Pleural effusion, not elsewhere classified: Secondary | ICD-10-CM | POA: Insufficient documentation

## 2023-05-08 DIAGNOSIS — J80 Acute respiratory distress syndrome: Secondary | ICD-10-CM | POA: Diagnosis not present

## 2023-05-08 DIAGNOSIS — I509 Heart failure, unspecified: Principal | ICD-10-CM

## 2023-05-08 DIAGNOSIS — I5023 Acute on chronic systolic (congestive) heart failure: Principal | ICD-10-CM | POA: Diagnosis present

## 2023-05-08 DIAGNOSIS — I480 Paroxysmal atrial fibrillation: Secondary | ICD-10-CM | POA: Insufficient documentation

## 2023-05-08 DIAGNOSIS — J449 Chronic obstructive pulmonary disease, unspecified: Secondary | ICD-10-CM | POA: Diagnosis not present

## 2023-05-08 DIAGNOSIS — Z20822 Contact with and (suspected) exposure to covid-19: Secondary | ICD-10-CM | POA: Insufficient documentation

## 2023-05-08 DIAGNOSIS — R0689 Other abnormalities of breathing: Secondary | ICD-10-CM | POA: Diagnosis not present

## 2023-05-08 DIAGNOSIS — R000 Tachycardia, unspecified: Secondary | ICD-10-CM | POA: Diagnosis not present

## 2023-05-08 DIAGNOSIS — Z79899 Other long term (current) drug therapy: Secondary | ICD-10-CM | POA: Insufficient documentation

## 2023-05-08 DIAGNOSIS — Z96611 Presence of right artificial shoulder joint: Secondary | ICD-10-CM | POA: Diagnosis not present

## 2023-05-08 DIAGNOSIS — E782 Mixed hyperlipidemia: Secondary | ICD-10-CM | POA: Diagnosis not present

## 2023-05-08 DIAGNOSIS — I10 Essential (primary) hypertension: Secondary | ICD-10-CM | POA: Diagnosis present

## 2023-05-08 DIAGNOSIS — I4891 Unspecified atrial fibrillation: Secondary | ICD-10-CM | POA: Diagnosis not present

## 2023-05-08 DIAGNOSIS — R0902 Hypoxemia: Secondary | ICD-10-CM | POA: Diagnosis not present

## 2023-05-08 DIAGNOSIS — R0600 Dyspnea, unspecified: Secondary | ICD-10-CM | POA: Diagnosis present

## 2023-05-08 DIAGNOSIS — F03C0 Unspecified dementia, severe, without behavioral disturbance, psychotic disturbance, mood disturbance, and anxiety: Secondary | ICD-10-CM

## 2023-05-08 DIAGNOSIS — R0989 Other specified symptoms and signs involving the circulatory and respiratory systems: Secondary | ICD-10-CM | POA: Diagnosis not present

## 2023-05-08 DIAGNOSIS — I110 Hypertensive heart disease with heart failure: Secondary | ICD-10-CM | POA: Diagnosis not present

## 2023-05-08 DIAGNOSIS — F03918 Unspecified dementia, unspecified severity, with other behavioral disturbance: Secondary | ICD-10-CM | POA: Diagnosis present

## 2023-05-08 LAB — COMPREHENSIVE METABOLIC PANEL
ALT: 65 U/L — ABNORMAL HIGH (ref 0–44)
AST: 40 U/L (ref 15–41)
Albumin: 3.3 g/dL — ABNORMAL LOW (ref 3.5–5.0)
Alkaline Phosphatase: 104 U/L (ref 38–126)
Anion gap: 11 (ref 5–15)
BUN: 11 mg/dL (ref 8–23)
CO2: 30 mmol/L (ref 22–32)
Calcium: 8.9 mg/dL (ref 8.9–10.3)
Chloride: 92 mmol/L — ABNORMAL LOW (ref 98–111)
Creatinine, Ser: 0.8 mg/dL (ref 0.44–1.00)
GFR, Estimated: >60 mL/min (ref >60)
Glucose, Bld: 104 mg/dL — ABNORMAL HIGH (ref 70–99)
Potassium: 3.5 mmol/L (ref 3.5–5.1)
Sodium: 133 mmol/L — ABNORMAL LOW (ref 135–145)
Total Bilirubin: 1.1 mg/dL (ref 0.0–1.2)
Total Protein: 6 g/dL — ABNORMAL LOW (ref 6.5–8.1)

## 2023-05-08 LAB — CBC WITH DIFFERENTIAL/PLATELET
Abs Immature Granulocytes: 0.07 10*3/uL (ref 0.00–0.07)
Basophils Absolute: 0.1 10*3/uL (ref 0.0–0.1)
Basophils Relative: 0 %
Eosinophils Absolute: 0 10*3/uL (ref 0.0–0.5)
Eosinophils Relative: 0 %
HCT: 35.2 % — ABNORMAL LOW (ref 36.0–46.0)
Hemoglobin: 11.5 g/dL — ABNORMAL LOW (ref 12.0–15.0)
Immature Granulocytes: 1 %
Lymphocytes Relative: 7 %
Lymphs Abs: 0.9 10*3/uL (ref 0.7–4.0)
MCH: 27.8 pg (ref 26.0–34.0)
MCHC: 32.7 g/dL (ref 30.0–36.0)
MCV: 85.2 fL (ref 80.0–100.0)
Monocytes Absolute: 1 10*3/uL (ref 0.1–1.0)
Monocytes Relative: 9 %
Neutro Abs: 9.5 10*3/uL — ABNORMAL HIGH (ref 1.7–7.7)
Neutrophils Relative %: 83 %
Platelets: 371 10*3/uL (ref 150–400)
RBC: 4.13 MIL/uL (ref 3.87–5.11)
RDW: 14.5 % (ref 11.5–15.5)
WBC: 11.5 10*3/uL — ABNORMAL HIGH (ref 4.0–10.5)
nRBC: 0 % (ref 0.0–0.2)

## 2023-05-08 LAB — TROPONIN I (HIGH SENSITIVITY)
Troponin I (High Sensitivity): 21 ng/L — ABNORMAL HIGH (ref <18)
Troponin I (High Sensitivity): 15 ng/L (ref <18)

## 2023-05-08 LAB — MAGNESIUM: Magnesium: 1.8 mg/dL (ref 1.7–2.4)

## 2023-05-08 LAB — BRAIN NATRIURETIC PEPTIDE: B Natriuretic Peptide: 1212.3 pg/mL — ABNORMAL HIGH (ref 0.0–100.0)

## 2023-05-08 LAB — VALPROIC ACID LEVEL: Valproic Acid Lvl: 11 ug/mL — ABNORMAL LOW (ref 50.0–100.0)

## 2023-05-08 LAB — D-DIMER, QUANTITATIVE: D-Dimer, Quant: 0.97 ug{FEU}/mL — ABNORMAL HIGH (ref 0.00–0.50)

## 2023-05-08 MED ORDER — APIXABAN 5 MG PO TABS
5.0000 mg | ORAL_TABLET | Freq: Two times a day (BID) | ORAL | Status: DC
  Administered 2023-05-08 – 2023-05-10 (×4): 5 mg via ORAL
  Filled 2023-05-08 (×4): qty 1

## 2023-05-08 MED ORDER — FUROSEMIDE 40 MG PO TABS
40.0000 mg | ORAL_TABLET | Freq: Every day | ORAL | Status: DC
  Administered 2023-05-09 – 2023-05-10 (×2): 40 mg via ORAL
  Filled 2023-05-08: qty 2
  Filled 2023-05-08: qty 1

## 2023-05-08 MED ORDER — QUETIAPINE FUMARATE 25 MG PO TABS
25.0000 mg | ORAL_TABLET | Freq: Once | ORAL | Status: AC
  Administered 2023-05-08: 25 mg via ORAL
  Filled 2023-05-08: qty 1

## 2023-05-08 MED ORDER — ACETAMINOPHEN 650 MG RE SUPP: 650.0000 mg | Freq: Four times a day (QID) | RECTAL | Status: DC | PRN

## 2023-05-08 MED ORDER — METOPROLOL SUCCINATE ER 50 MG PO TB24
50.0000 mg | ORAL_TABLET | Freq: Every day | ORAL | Status: DC
  Administered 2023-05-08 – 2023-05-10 (×3): 50 mg via ORAL
  Filled 2023-05-08: qty 1
  Filled 2023-05-08 (×2): qty 2

## 2023-05-08 MED ORDER — IPRATROPIUM-ALBUTEROL 0.5-2.5 (3) MG/3ML IN SOLN
3.0000 mL | Freq: Four times a day (QID) | RESPIRATORY_TRACT | Status: DC
  Administered 2023-05-08 – 2023-05-09 (×2): 3 mL via RESPIRATORY_TRACT
  Filled 2023-05-08 (×2): qty 3

## 2023-05-08 MED ORDER — IPRATROPIUM-ALBUTEROL 0.5-2.5 (3) MG/3ML IN SOLN
3.0000 mL | Freq: Once | RESPIRATORY_TRACT | Status: AC
  Administered 2023-05-08: 3 mL via RESPIRATORY_TRACT
  Filled 2023-05-08: qty 3

## 2023-05-08 MED ORDER — ONDANSETRON HCL 4 MG/2ML IJ SOLN
4.0000 mg | Freq: Once | INTRAMUSCULAR | Status: AC
  Administered 2023-05-08: 4 mg via INTRAVENOUS
  Filled 2023-05-08: qty 2

## 2023-05-08 MED ORDER — MEMANTINE HCL 10 MG PO TABS
20.0000 mg | ORAL_TABLET | Freq: Two times a day (BID) | ORAL | Status: DC
Start: 2023-05-08 — End: 2023-05-10
  Administered 2023-05-08 – 2023-05-10 (×4): 20 mg via ORAL
  Filled 2023-05-08 (×5): qty 2

## 2023-05-08 MED ORDER — METHYLPREDNISOLONE SODIUM SUCC 125 MG IJ SOLR
125.0000 mg | Freq: Once | INTRAMUSCULAR | Status: AC
  Administered 2023-05-08: 125 mg via INTRAVENOUS
  Filled 2023-05-08: qty 2

## 2023-05-08 MED ORDER — QUETIAPINE FUMARATE 50 MG PO TABS
50.0000 mg | ORAL_TABLET | Freq: Two times a day (BID) | ORAL | Status: DC
Start: 2023-05-09 — End: 2023-05-10
  Administered 2023-05-09 – 2023-05-10 (×3): 50 mg via ORAL
  Filled 2023-05-08: qty 1
  Filled 2023-05-08: qty 2
  Filled 2023-05-08: qty 1

## 2023-05-08 MED ORDER — ACETAMINOPHEN 325 MG PO TABS: 650.0000 mg | ORAL_TABLET | Freq: Four times a day (QID) | ORAL | Status: DC | PRN

## 2023-05-08 MED ORDER — MELATONIN 5 MG PO TABS
10.0000 mg | ORAL_TABLET | Freq: Every day | ORAL | Status: DC
  Administered 2023-05-08 – 2023-05-09 (×2): 10 mg via ORAL
  Filled 2023-05-08 (×2): qty 2

## 2023-05-08 MED ORDER — BENZONATATE 100 MG PO CAPS: 100.0000 mg | ORAL_CAPSULE | Freq: Two times a day (BID) | ORAL | Status: DC | PRN

## 2023-05-08 MED ORDER — CITALOPRAM HYDROBROMIDE 20 MG PO TABS
20.0000 mg | ORAL_TABLET | Freq: Every day | ORAL | Status: DC
  Administered 2023-05-08 – 2023-05-10 (×3): 20 mg via ORAL
  Filled 2023-05-08 (×2): qty 2
  Filled 2023-05-08: qty 1
  Filled 2023-05-08: qty 2

## 2023-05-08 MED ORDER — ALBUTEROL SULFATE (2.5 MG/3ML) 0.083% IN NEBU: 2.5000 mg | INHALATION_SOLUTION | RESPIRATORY_TRACT | Status: DC | PRN

## 2023-05-08 MED ORDER — POLYETHYLENE GLYCOL 3350 17 G PO PACK: 17.0000 g | PACK | Freq: Every day | ORAL | Status: DC | PRN

## 2023-05-08 MED ORDER — DIVALPROEX SODIUM 125 MG PO CSDR
250.0000 mg | DELAYED_RELEASE_CAPSULE | Freq: Two times a day (BID) | ORAL | Status: DC
Start: 2023-05-08 — End: 2023-05-10
  Administered 2023-05-08 – 2023-05-10 (×4): 250 mg via ORAL
  Filled 2023-05-08 (×5): qty 2

## 2023-05-08 MED ORDER — METOPROLOL TARTRATE 5 MG/5ML IV SOLN
5.0000 mg | Freq: Once | INTRAVENOUS | Status: AC
Start: 2023-05-08 — End: 2023-05-08
  Administered 2023-05-08: 5 mg via INTRAVENOUS
  Filled 2023-05-08: qty 5

## 2023-05-08 MED ORDER — FUROSEMIDE 10 MG/ML IJ SOLN
60.0000 mg | Freq: Once | INTRAMUSCULAR | Status: AC
  Administered 2023-05-08: 60 mg via INTRAVENOUS
  Filled 2023-05-08: qty 6

## 2023-05-08 NOTE — ED Provider Notes (Signed)
 [facility] Provider Note   CSN: [id number] Arrival date & time: 05/08/23  1204     History  Chief Complaint  Patient presents with   Shortness of Breath    Patient arrived from home with shortness of breath, showing Afib on monitor, patient alert, with dementia    Marisa Nunez is a 78 y.o. female with PMH as listed below who presents with AF w/ RVR. Patient has h/o dementia, level 5 caveat. Patient per chart review was evaluated on 05/04/23 at [facility] for 3 d of URI symptoms inc dry cough, Nausea, SOB. She does have h/o COPD. EMS gave 2 duonebs and 2 SL nitroglycerin tabs. She also has h/o paroxysmal Afib and she takes Eliquis .  Patient denies CP, endorses SOB and nausea. On arrival patient in Afib w/ RVR and stating she feels improved but still SOB.    Past Medical History:  Diagnosis Date   Anxiety    Breast cancer (HCC)    Chronic combined systolic and diastolic CHF (congestive heart failure) (HCC)    a. Echo 11/2021: LVEF of 40-45% with global hypokinesis, mildly reduced RV systolic function, and moderate MR   COPD (chronic obstructive pulmonary disease) (HCC)    Dementia (HCC)    Depression    History of TIA 10/30/2020   History of tobacco use 05/06/2017   Formatting of this note might be different from the original.  Greater than 40 pack year history. Quit in 2015.     HTN (hypertension)    Paroxysmal atrial fibrillation (HCC) 2017   Stroke [facility])        Home Medications Prior to Admission medications   Medication Sig Start Date End Date Taking? Authorizing Provider  acetaminophen  (TYLENOL ) 500 MG tablet Take 1 tablet (500 mg total) by mouth every 6 (six) hours as needed for moderate pain, fever or mild pain. 10/24/21  Yes Pokhrel, Laxman, MD  albuterol  (PROVENTIL ) (2.5 MG/3ML) 0.083% nebulizer solution Take 3 mLs (2.5 mg total) by nebulization every 4 (four) hours as needed for shortness of breath or wheezing. 12/29/22  Yes Onuoha,  Josephine C, NP  albuterol  (VENTOLIN  HFA) 108 (90 Base) MCG/ACT inhaler Inhale 1-2 puffs into the lungs every 6 (six) hours as needed. 05/04/23  Yes Mayer, Jodi R, NP  benzonatate  (TESSALON ) 100 MG capsule Take 1 capsule (100 mg total) by mouth every 8 (eight) hours. Patient taking differently: Take 100 mg by mouth 2 (two) times daily as needed for cough. 05/04/23  Yes Mayer, Jodi R, NP  citalopram  (CELEXA ) 20 MG tablet Take 1 tablet (20 mg total) by mouth in the morning and at bedtime. 05/07/23 08/05/23 Yes Lovie Mliss, MD  divalproex  (DEPAKOTE  SPRINKLE) 125 MG capsule Take 2 capsules (250 mg total) by mouth 2 (two) times daily. 04/06/23 05/08/23 Yes Lovie Mliss, MD  furosemide  (LASIX ) 20 MG tablet Take 2 tablets (40 mg total) by mouth daily. 01/20/23  Yes Lovie Mliss, MD  Melatonin 10 MG TABS Take 10 mg by mouth at bedtime.   Yes [provider]  memantine  (NAMENDA ) 10 MG tablet Take 2 tablets (20 mg total) by mouth 2 (two) times daily. 01/28/23 01/28/24 Yes Lovie Mliss, MD  metoprolol  succinate (TOPROL -XL) 50 MG 24 hr tablet Take 1 tablet (50 mg total) by mouth daily. Take with or immediately following a meal. 01/28/23 05/08/23 Yes Lovie Mliss, MD  Multiple Vitamins-Minerals (MULTIVITAMIN WITH MINERALS) tablet Take 1 tablet by mouth daily with breakfast.  Yes [provider]  ondansetron  (ZOFRAN -ODT) 4 MG disintegrating tablet Take 4 mg by mouth every 8 (eight) hours as needed for vomiting or nausea (dissolve orally). 06/29/22  Yes [provider]  potassium chloride  (KLOR-CON  M) 10 MEQ tablet TAKE 1 TABLET(10 MEQ) BY MOUTH TWICE DAILY 02/02/23  Yes Lovie Clarity, MD  QUEtiapine  (SEROQUEL ) 50 MG tablet Take 1 tablet (50 mg total) by mouth 2 (two) times daily. 12/29/22 05/08/23 Yes Onuoha, Josephine C, NP  rivaroxaban  (XARELTO ) 20 MG TABS tablet Take 1 tablet (20 mg total) by mouth daily with supper. Do not take with Eliquis  05/11/23 08/09/23  Lovie Clarity, MD      Allergies     Influenza virus vaccine, Myrbetriq  [mirabegron  er], Pneumococcal vaccine, and Ativan  [lorazepam ]    Review of Systems   Review of Systems A 10 point review of systems was performed and is negative unless otherwise reported in HPI.  Physical Exam Updated Vital Signs BP (!) 118/92 (BP Location: Right Arm)   Pulse 85   Temp 97.6 F (36.4 C) (Oral)   Resp 16   Ht 5' 4 (1.626 m)   LMP  (LMP Unknown)   SpO2 93%   BMI 19.74 kg/m  Physical Exam General: Normal appearing elderly female, sitting in bed.  HEENT: Sclera anicteric, MMM, trachea midline.  Cardiology: Irregularly irregular and tachycardic rhythm, no murmurs/rubs/gallops.  Resp: Tachypnea with decreased lung sounds bilaterally.  Expiratory wheezing heard.  Also crackles at lung bases.  On 2 L nasal cannula. No respiratory distress. Abd: Soft, non-tender, non-distended. No rebound tenderness or guarding.  GU: Deferred. MSK: Trace peripheral symmetric edema bilaterally. Extremities without deformity or TTP. No cyanosis or clubbing. Skin: warm, dry.  Back: No CVA tenderness Neuro: A&Ox2, CNs II-XII grossly intact. MAEs. Sensation grossly intact.  Psych: Normal mood and affect.   ED Results / Procedures / Treatments   Labs (all labs ordered are listed, but only abnormal results are displayed) Labs Reviewed  CBC WITH DIFFERENTIAL/PLATELET - Abnormal; Notable for the following components:      Result Value   WBC 11.5 (*)    Hemoglobin 11.5 (*)    HCT 35.2 (*)    Neutro Abs 9.5 (*)    All other components within normal limits  COMPREHENSIVE METABOLIC PANEL - Abnormal; Notable for the following components:   Sodium 133 (*)    Chloride 92 (*)    Glucose, Bld 104 (*)    Total Protein 6.0 (*)    Albumin 3.3 (*)    ALT 65 (*)    All other components within normal limits  D-DIMER, QUANTITATIVE - Abnormal; Notable for the following components:   D-Dimer, Quant 0.97 (*)    All other components within normal limits  BRAIN  NATRIURETIC PEPTIDE - Abnormal; Notable for the following components:   B Natriuretic Peptide 1,212.3 (*)    All other components within normal limits  TROPONIN I (HIGH SENSITIVITY) - Abnormal; Notable for the following components:   Troponin I (High Sensitivity) 21 (*)    All other components within normal limits  RESPIRATORY PANEL BY PCR  MRSA NEXT GEN BY PCR, NASAL  MAGNESIUM   TSH  MAGNESIUM   TROPONIN I (HIGH SENSITIVITY)    EKG EKG Interpretation Date/Time:  Friday May 08 2023 21:18:30 EST Ventricular Rate:  79 PR Interval:    QRS Duration:  87 QT Interval:  419 QTC Calculation: 481 R Axis:   45  Text Interpretation: Atrial fibrillation Borderline T wave abnormalities When  compared with ECG of 05/07/2023, No significant change was found Confirmed by Raford Lenis ([id number]) on 05/08/2023 11:57:17 PM  Radiology CXR: Improved CHF with similar small right pleural effusion.    Procedures Procedures    Medications Ordered in ED Medications  ondansetron  (ZOFRAN ) injection 4 mg (4 mg Intravenous Given 05/08/23 1231)  QUEtiapine  (SEROQUEL ) tablet 25 mg (25 mg Oral Given 05/08/23 1302)  ipratropium-albuterol  (DUONEB) 0.5-2.5 (3) MG/3ML nebulizer solution 3 mL (3 mLs Nebulization Given 05/08/23 1328)  methylPREDNISolone  sodium succinate (SOLU-MEDROL ) 125 mg/2 mL injection 125 mg (125 mg Intravenous Given 05/08/23 1328)  QUEtiapine  (SEROQUEL ) tablet 25 mg (25 mg Oral Given 05/08/23 1348)  metoprolol  tartrate (LOPRESSOR ) injection 5 mg (5 mg Intravenous Given 05/08/23 1552)  furosemide  (LASIX ) injection 60 mg (60 mg Intravenous Given 05/08/23 1718)  QUEtiapine  (SEROQUEL ) tablet 25 mg (25 mg Oral Given 05/08/23 1717)    ED Course/ Medical Decision Making/ A&P                          Medical Decision Making Amount and/or Complexity of Data Reviewed Labs: ordered. Decision-making details documented in ED Course. Radiology: ordered. Decision-making details documented in ED  Course.  Risk Prescription drug management. Decision regarding hospitalization.    This patient presents to the ED for concern of dyspnea, tachycardia, this involves an extensive number of treatment options, and is a complaint that carries with it a high risk of complications and morbidity.  I considered the following differential and admission for this acute, potentially life threatening condition.   MDM:    DDX for dyspnea includes but is not limited to:  Greatest c/f HFrEF exacerbation vs COPD exacerbation.  Patient was hypoxic in the high 80s on room air and placed on 2 L nasal cannula.  Patient w/ Afib w/ RVR on the monitor as well, will treat w/ metoprolol  IV one dose which improved her rate. BNP elevated to 1212 and CXR shows improved CHF with similar small R pleural effusion compared to 1/9. Considered also PE but not highest likelihood and dimer negative by years criteria. No CP to indicate ACS and troponin is reassuring, EKG w/ no ischemic signs. No PNA or PTX on CXR. Patient will be treated w/ steroids, duonebs, and lasix . Patient is also difficult to redirect given dementia and attempting to get out of bed, will give seroquel  PO.    Clinical Course as of 05/12/23 2315  Fri May 08, 2023  1245 DG Chest Portable 1 View Improved CHF with similar small right pleural effusion. [HN]  1247 Patient with dementia and behavioral disturbance. Trying multiple times to get out of bed and has to be constantly redirected. Used to take seroquel  at home. Will give 25 mg PO seroquel  now.  [HN]  1428 WBC(!): 11.5 +leukocytosis [HN]  1428 ALT(!): 65 Mildly elevatedALT [HN]  1428 D-Dimer, Quant(!): 0.97 Elevated D-dimer. Will get CT PE. [HN]  1429 Pulse Rate(!): 141 Rate had been mostly controlled ~100-110 bpm on arrival but now is intermittent going up to 140 bpm. Will give 5 mg IV metoprolol . She takes metoprolol  PO at home. [HN]    Clinical Course User Index [HN] Franklyn Sid SAILOR, MD     Labs: I Ordered, and personally interpreted labs.  The pertinent results include:  those listed aobve  Imaging Studies ordered: I ordered imaging studies including CXR I independently visualized and interpreted imaging. I agree with the radiologist interpretation  Additional history obtained from chart review,  EMS.   Cardiac Monitoring: The patient was maintained on a cardiac monitor.  I personally viewed and interpreted the cardiac monitored which showed an underlying rhythm of: Afib w/ RVR, then rate-controlled AF  Reevaluation: After the interventions noted above, I reevaluated the patient and found that they have :improved  Social Determinants of Health: Lives independently  Disposition:  Patient is signed out to the oncoming ED physician Dr. Armenta who is made aware of her history, presentation, exam, workup, and plan. Plan is to admit patient.    Co morbidities that complicate the patient evaluation  Past Medical History:  Diagnosis Date   Anxiety    Breast cancer (HCC)    Chronic combined systolic and diastolic CHF (congestive heart failure) (HCC)    a. Echo 11/2021: LVEF of 40-45% with global hypokinesis, mildly reduced RV systolic function, and moderate MR   COPD (chronic obstructive pulmonary disease) (HCC)    Dementia (HCC)    Depression    History of TIA 10/30/2020   History of tobacco use 05/06/2017   Formatting of this note might be different from the original.  Greater than 40 pack year history. Quit in 2015.     HTN (hypertension)    Paroxysmal atrial fibrillation (HCC) 2017   Stroke (HCC)      Medicines Meds ordered this encounter  Medications   ondansetron  (ZOFRAN ) injection 4 mg   QUEtiapine  (SEROQUEL ) tablet 25 mg   ipratropium-albuterol  (DUONEB) 0.5-2.5 (3) MG/3ML nebulizer solution 3 mL   methylPREDNISolone  sodium succinate (SOLU-MEDROL ) 125 mg/2 mL injection 125 mg    IV methylprednisolone  will be converted to either a q12h or q24h  frequency with the same total daily dose (TDD).  Ordered Dose: 1 to 125 mg TDD; convert to: TDD q24h.  Ordered Dose: 126 to 250 mg TDD; convert to: TDD div q12h.  Ordered Dose: >250 mg TDD; DAW.   QUEtiapine  (SEROQUEL ) tablet 25 mg   metoprolol  tartrate (LOPRESSOR ) injection 5 mg   furosemide  (LASIX ) injection 60 mg   QUEtiapine  (SEROQUEL ) tablet 25 mg   DISCONTD: melatonin tablet 10 mg   DISCONTD: acetaminophen  (TYLENOL ) tablet 650 mg   DISCONTD: acetaminophen  (TYLENOL ) suppository 650 mg   DISCONTD: polyethylene glycol (MIRALAX  / GLYCOLAX ) packet 17 g   DISCONTD: albuterol  (PROVENTIL ) (2.5 MG/3ML) 0.083% nebulizer solution 2.5 mg   DISCONTD: apixaban  (ELIQUIS ) tablet 5 mg   DISCONTD: benzonatate  (TESSALON ) capsule 100 mg   DISCONTD: citalopram  (CELEXA ) tablet 20 mg   DISCONTD: divalproex  (DEPAKOTE  SPRINKLE) capsule 250 mg   DISCONTD: furosemide  (LASIX ) tablet 40 mg   DISCONTD: memantine  (NAMENDA ) tablet 20 mg   DISCONTD: metoprolol  succinate (TOPROL -XL) 24 hr tablet 50 mg   DISCONTD: QUEtiapine  (SEROQUEL ) tablet 50 mg   DISCONTD: ipratropium-albuterol  (DUONEB) 0.5-2.5 (3) MG/3ML nebulizer solution 3 mL   QUEtiapine  (SEROQUEL ) tablet 25 mg   DISCONTD: ondansetron  (ZOFRAN ) tablet 4 mg   DISCONTD: ipratropium-albuterol  (DUONEB) 0.5-2.5 (3) MG/3ML nebulizer solution 3 mL   potassium chloride  (KLOR-CON ) packet 40 mEq    I have reviewed the patients home medicines and have made adjustments as needed  Problem List / ED Course: Problem List Items Addressed This Visit   None Visit Diagnoses       Acute on chronic congestive heart failure, unspecified heart failure type (HCC)    -  Primary   Relevant Medications   metoprolol  tartrate (LOPRESSOR ) injection 5 mg (Completed)   furosemide  (LASIX ) injection 60 mg (Completed)     Severe dementia, unspecified dementia type, unspecified  whether behavioral, psychotic, or mood disturbance or anxiety (HCC)       Relevant Medications   QUEtiapine   (SEROQUEL ) tablet 25 mg (Completed)   QUEtiapine  (SEROQUEL ) tablet 25 mg (Completed)   QUEtiapine  (SEROQUEL ) tablet 25 mg (Completed)   QUEtiapine  (SEROQUEL ) tablet 25 mg (Completed)                   This note was created using dictation software, which may contain spelling or grammatical errors.    Franklyn Sid SAILOR, MD 05/12/23 [phone]

## 2023-05-08 NOTE — Hospital Course (Addendum)
  Subjective: Patient is pleasantly demented and not able to give much information about what is going on. She notes that she has been having a good amount of shortness of breath. She sleeps flat at night. Does not use many pillows. She denies any chest pain or any abdominal pain. She states that she is eating a lot of food, and that she forgets that she eats. She denies any nausea, vomiting, or diarrhea. No dysuria, urinary frequency, or any other urinary symptoms.   Medications: Acetaminophen  500 mg every 6 hours as needed Albuterol  nebulizer Albuterol  inhaler Eliquis  5 mg twice daily Tessalon  Perles 100 mg every 8 hours Celexa  20 mg twice daily Depakote  250 mg twice daily Lasix  40 mg daily- down to 1 pill daily 4 to 5 months ago  Melatonin 10 mg nightly Memantine  20 mg twice daily Metoprolol  succinate 50 mg daily Zofran  4 mg every 8 hours as needed KCl 10 mill equivalents twice daily Seroquel  50 mg twice daily  Medical history: History of stroke, hypertension, hyperlipidemia, persistent atrial fibrillation, dementia  Social history: Lives with: [name] at home  Occupation: retired  Support: Level of function: PCP: Mliss Foot, MD Substance use: former smoker first smoked when she was 5-82 years old.   Family history:  Surgical history:  Per Daughter: Saturday she states that she started coughing. She states that Sunday night they called EMS and they states that her oxygen  levels were okay and they gave her some inhalers and stated that she was okay. Monday morning was the same and they went to Urgent care. She had a viral infection and see what happened. Wednesday they went to ED and thought to have a CHF exacerbation. Gave her some lasix  and sent her home. Now has the same concerns. Yesterday she was much better. Now having lots of phlegm and having some shortness of breath. She was hypoxia. Did not get any of her medications this morning.  Code Status: DNR/DNI .    05/09/23:  Doesn't remember why she was here. She states her shortness of breath wasn';t too bad. No pain, taking all medicine. Daughter has been helping her. No chest pain, palpitations.

## 2023-05-08 NOTE — ED Notes (Signed)
 Stephanie(daughter) updated as to patient's status.

## 2023-05-08 NOTE — ED Provider Notes (Addendum)
 Anticipate admit for COPD and CHF exacerbation. Afib RVR with h/o PAF. Admit pending CT PE. Confabulate due to severe dementia, poor historian. Got seroquel . New O2 requirement. Physical Exam  BP (!) 141/92   Pulse 71   Temp (!) 97.4 F (36.3 C) (Oral)   Resp 16   LMP  (LMP Unknown)   SpO2 92%   Physical Exam  Procedures  Procedures  ED Course / MDM   Clinical Course as of 05/09/23 0025  Fri May 08, 2023  1245 DG Chest Portable 1 View Improved CHF with similar small right pleural effusion. [HN]  1247 Patient with dementia and behavioral disturbance. Trying multiple times to get out of bed and has to be constantly redirected. Used to take seroquel  at home. Will give 25 mg PO seroquel  now.  [HN]  1428 WBC(!): 11.5 +leukocytosis [HN]  1428 ALT(!): 65 Mildly elevatedALT [HN]  1428 D-Dimer, Quant(!): 0.97 Elevated D-dimer. Will get CT PE. [HN]  1429 Pulse Rate(!): 141 Rate had been mostly controlled ~100-110 bpm on arrival but now is intermittent going up to 140 bpm. Will give 5 mg IV metoprolol . She takes metoprolol  PO at home. [HN]    Clinical Course User Index [HN] Franklyn Sid SAILOR, MD   Medical Decision Making Amount and/or Complexity of Data Reviewed Labs: ordered. Decision-making details documented in ED Course. Radiology: ordered. Decision-making details documented in ED Course.  Risk OTC drugs. Prescription drug management. Decision regarding hospitalization.   I have reassessed the patient.  She is awake and alert sitting at the edge of the bed.  She is verbally interactive but recognizes that she has memory loss and confusion.  She does not have respiratory distress at rest but on exam has crackles bilaterally lower lung fields.  1+ edema lower extremities.  I called the patient's daughter.  Patient lives with her daughter.  Her daughter reports that she was short of breath yesterday and seen in the emergency department.  She reports that she was better at the time  that she left but became short of breath again today.  She reports today it seemed to worsen yesterday and she return to the emergency department.  She reports that the patient's level of confusion with her dementia has been somewhat worse over the past several weeks, with more pronounced sundowning in the evenings.  Her daughter advises that she needs to get her melatonin at 7 PM to get any sleep at night.  She has been very restless at the nighttime.  Patient initial treatment was for COPD exacerbation.  Patient got a DuoNeb therapy, Medrol , 5 mg of Lopressor , 25 mg of Seroquel . After reassessing the patient and reviewing chest x-ray with vascular congestion, crackles on examination and BNP of 1400, I have added Lasix  60 mg IV and additional 25 mg of Seroquel  per her home orders.  Also added 10 mg of melatonin for 7 PM administration per daughter's recommendations.  Plan for admission to internal medicine service.         Armenta Canning, MD 05/08/23 [facility]    Armenta Canning, MD 05/11/23 [phone]

## 2023-05-08 NOTE — H&P (Addendum)
 Date: 05/08/2023               Patient Name:  Marisa Nunez MRN: [id number]  DOB: 09-13-1945 Age / Sex: 78 y.o., female   PCP: Lovie Mliss, MD         Medical Service: Internal Medicine Teaching Service         Attending Physician: Dr. Karna Fellows, MD      First Contact: Dr. Lonni Africa, DO Pager [phone]    Second Contact: Dr. Roetta Chars, MD Pager [phone]         After Hours (After 5p/  First Contact Pager: [phone]  weekends / holidays): Second Contact Pager: [phone]   SUBJECTIVE   Chief Complaint: Dyspnea  History of Present Illness:  This is a 78 year old female with pertinent past medical history of hypertension, hyperlipidemia, persistent A-fib, dementia, HFrEF, history of CVA, suspected COPD and history of breast cancer who presented with dyspnea and is hospitalized for congestive heart failure exacerbation.  Patient's history is limited by cognitive issues and dementia, her daughter Marisa Nunez provides much of the collateral regarding her history.   Family states that the patient began coughing on Saturday, EMS was called on Sunday night at which time they were told that her and exam were reassuring.  They gave her some inhalers.  Monday morning she continues to have dyspnea and cough, presented urgent care.  They were told she has a viral infection.  Wednesday they again presented to the ED and were thought to have a CHF exacerbation, and given some Lasix  and sent home.  She did improve transiently, but did have recurrence of her dyspnea and began having more congestion.  She is taking home Lasix , however a few months ago patient was decreased on her Lasix  1 tablet daily from 2 tablets daily.  They state that she not get any of her medications this morning.  The patient otherwise denies chest pain, abdominal pain, fevers, chills, night sweats, constipation, diarrhea, hematochezia, hematemesis, dysuria, or burning with urination.  Medications: Acetaminophen  500  mg every 6 hours as needed Albuterol  nebulizer Albuterol  inhaler Eliquis  5 mg twice daily Tessalon  Perles 100 mg every 8 hours Celexa  20 mg twice daily Depakote  250 mg twice daily Lasix  20 mg daily Melatonin 10 mg nightly Memantine  20 mg twice daily Metoprolol  succinate 50 mg daily Zofran  4 mg every 8 hours as needed KCl 10 mill equivalents twice daily Seroquel  50 mg twice daily  ED Course: Yesterday, patient seen in ED given Zofran  4 mg, Solu-Medrol  125 mg IV, DuoNeb, Lasix  40 mg IV. On today's admission, patient is demonstrating some autonomic instability, pulse ranging from 51 - 141.  Tachypneic to 26, hypertensive to 161/93, and hypoxic as low as 84. Lab work demonstrating leukocytosis to 11.5, chronic anemia to 11.5 as well.  CMP with trivial hyponatremia to 133, hypochloremia to 92.  Transaminitis with ALT of 65 which is new.  D-dimer elevated to 0.97.  BNP elevated to 1200, down from 1400 yesterday.  Trivial troponin elevation, repeat tropes pending.  Valproic acid  level pending. Received 60 mg IV Lasix , DuoNeb nebulizers, 125 methylprednisolone , 5 mg IV metoprolol , 4 mg Zofran , 75 mg Seroquel .  Meds:  Current Meds  Medication Sig   acetaminophen  (TYLENOL ) 500 MG tablet Take 1 tablet (500 mg total) by mouth every 6 (six) hours as needed for moderate pain, fever or mild pain.   albuterol  (PROVENTIL ) (2.5 MG/3ML) 0.083% nebulizer solution Take 3 mLs (2.5 mg total) by nebulization every 4 (  four) hours as needed for shortness of breath or wheezing.   albuterol  (VENTOLIN  HFA) 108 (90 Base) MCG/ACT inhaler Inhale 1-2 puffs into the lungs every 6 (six) hours as needed.   apixaban  (ELIQUIS ) 5 MG TABS tablet TAKE 1 TABLET(5 MG) BY MOUTH TWICE DAILY   benzonatate  (TESSALON ) 100 MG capsule Take 1 capsule (100 mg total) by mouth every 8 (eight) hours. (Patient taking differently: Take 100 mg by mouth 2 (two) times daily as needed for cough.)   citalopram  (CELEXA ) 20 MG tablet Take 1 tablet  (20 mg total) by mouth in the morning and at bedtime.   divalproex  (DEPAKOTE  SPRINKLE) 125 MG capsule Take 2 capsules (250 mg total) by mouth 2 (two) times daily.   furosemide  (LASIX ) 20 MG tablet Take 2 tablets (40 mg total) by mouth daily.   Melatonin 10 MG TABS Take 10 mg by mouth at bedtime.   memantine  (NAMENDA ) 10 MG tablet Take 2 tablets (20 mg total) by mouth 2 (two) times daily.   metoprolol  succinate (TOPROL -XL) 50 MG 24 hr tablet Take 1 tablet (50 mg total) by mouth daily. Take with or immediately following a meal.   Multiple Vitamins-Minerals (MULTIVITAMIN WITH MINERALS) tablet Take 1 tablet by mouth daily with breakfast.   ondansetron  (ZOFRAN -ODT) 4 MG disintegrating tablet Take 4 mg by mouth every 8 (eight) hours as needed for vomiting or nausea (dissolve orally).   potassium chloride  (KLOR-CON  M) 10 MEQ tablet TAKE 1 TABLET(10 MEQ) BY MOUTH TWICE DAILY   QUEtiapine  (SEROQUEL ) 50 MG tablet Take 1 tablet (50 mg total) by mouth 2 (two) times daily.    Past Medical History  Past Surgical History:  Procedure Laterality Date   ABDOMINAL HYSTERECTOMY     CARDIOVERSION N/A 02/19/2022   Procedure: CARDIOVERSION;  Surgeon: Hobart Powell BRAVO, MD;  Location[facility] ENDOSCOPY;  Service: Cardiovascular;  Laterality: N/A;   REVERSE SHOULDER ARTHROPLASTY Right 10/21/2021   Procedure: RIGHT REVERSE TOTAL SHOULDER ARTHROPLASTY;  Surgeon: Cristy Bonner DASEN, MD;  Location: [facility] OR;  Service: Orthopedics;  Laterality: Right;  allen bed, spider, tornier (rep aware), open shoulder tray.    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   OBJECTIVE:   Physical Exam: Blood pressure (!) 161/93, pulse 84, temperature (!) 97.5 F (36.4 C), temperature source Oral, resp. rate 17, SpO2 97%.  Constitutional: Chronically ill-appearing in no acute distress Cardiovascular: Irregularly irregular rate and rhythm Pulmonary/Chest: Crackles at the lung bases bilaterally, no wheezing appreciated. Saturation in the high 90's on room air.  Abdominal: soft, non-tender, non-distended Extremities: Trace edema to midshin bilaterally. Psych: Oriented to person, place.  Some difficulty with time and situation.  Pleasant affect, circumstantial thought process.  Labs: CBC    Component Value Date/Time   WBC 11.5 (H) 05/08/2023 1344   RBC 4.13 05/08/2023 1344   HGB 11.5 (L) 05/08/2023 1344   HCT 35.2 (L) 05/08/2023 1344   PLT 371 05/08/2023 1344   MCV 85.2 05/08/2023 1344   MCH 27.8 05/08/2023 1344   MCHC  32.7 05/08/2023 1344   RDW 14.5 05/08/2023 1344   LYMPHSABS 0.9 05/08/2023 1344   MONOABS 1.0 05/08/2023 1344   EOSABS 0.0 05/08/2023 1344   BASOSABS 0.1 05/08/2023 1344     CMP     Component Value Date/Time   NA 133 (L) 05/08/2023 1344   NA 131 (L) 06/26/2022 1456   K 3.5 05/08/2023 1344   CL 92 (L) 05/08/2023 1344   CO2 30 05/08/2023 1344   GLUCOSE 104 (H) 05/08/2023 1344   BUN 11 05/08/2023 1344   BUN 13 06/26/2022 1456   CREATININE 0.80 05/08/2023 1344   CALCIUM  8.9 05/08/2023 1344   PROT 6.0 (L) 05/08/2023 1344   ALBUMIN 3.3 (L) 05/08/2023 1344   AST 40 05/08/2023 1344   ALT 65 (H) 05/08/2023 1344   ALKPHOS 104 05/08/2023 1344   BILITOT 1.1 05/08/2023 1344   GFRNONAA >60 05/08/2023 1344    Imaging: Chest x-ray demonstrating right-sided pleural effusion, improved interstitial edema from yesterday.  EKG: pending  ASSESSMENT & PLAN:   Assessment & Plan by Problem: Principal  Problem:   Acute on chronic HFrEF (heart failure with reduced ejection fraction) (HCC) Active Problems:   Essential hypertension   Mixed hyperlipidemia   Persistent atrial fibrillation (HCC)   Dementia with behavioral disturbance (HCC)   JOHAN ANTONACCI is a 78 y.o. person living with a history of hypertension, hyperlipidemia, persistent A-fib, dementia, HFrEF, history of CVA, suspected COPD and history of breast cancer who presented with dyspnea and is hospitalized for congestive heart failure exacerbation on hospital day 0  Hypoxic respiratory failure HFrEF exacerbation Presumed COPD Pleural Effusion This is a 78 year old female with A-fib, presumed COPD, and heart failure presenting for hypoxic respiratory failure.  On my exam she does have crackles at the lung bases, lower extremity edema, with no significant wheezing.  She did have elevated D-dimer, but she is on DOAC for A-fib.  Doubt PE.  Her respiratory status, BNP, and chest x-ray did improve with IV Lasix .  I believe her volume status is driving the majority of her hypoxic respiratory failure.   On our assessment of the patient, she was not having any significant dyspnea and we were able to titrate her down to room air where she maintained her oxygen  saturations.  She has received 60 mg IV Lasix  in the ED, and is putting out good urine. She did get DuoNebs and methylprednisolone , which may mask any wheezing which she may have had.  She does not have formal PFTs to diagnose COPD.  Plan: - Status post 60 mg IV Lasix , transition to 40 mg p.o. tomorrow - Will hold off on additional steroids, given her presentation more in line with heart failure exacerbation rather than COPD -DuoNebs every 6 hours -Echo  Chronic conditions: Hypertension Persistent A-fib -EKG -Metoprolol  50 daily -Eliquis  5 mg twice daily  Dementia with associated behavioral disturbances. -Resume home Celexa  20, Depakote  sprinkle, melatonin 10 mg nightly,  memantine  20 mg twice daily, Seroquel  50 twice daily. -Patient has reported paradoxical reaction to Ativan , avoid benzodiazepines.  Diet: Heart Healthy VTE: DOAC IVF: None,None Code: DNR/DNI  Prior to Admission Living Arrangement: Home, living with daughter Anticipated Discharge Location: Home Barriers to Discharge: Clinical improvement  Dispo: Admit patient to Observation with expected length of stay less than 2 midnights.  Signed: Gabino Boga, MD Internal Medicine Resident PGY-1  05/08/2023, 7:48 PM

## 2023-05-09 ENCOUNTER — Other Ambulatory Visit (HOSPITAL_COMMUNITY): Payer: Medicare Other

## 2023-05-09 ENCOUNTER — Observation Stay (HOSPITAL_BASED_OUTPATIENT_CLINIC_OR_DEPARTMENT_OTHER): Payer: Medicare Other

## 2023-05-09 DIAGNOSIS — I5023 Acute on chronic systolic (congestive) heart failure: Principal | ICD-10-CM

## 2023-05-09 DIAGNOSIS — I4819 Other persistent atrial fibrillation: Secondary | ICD-10-CM

## 2023-05-09 DIAGNOSIS — F03918 Unspecified dementia, unspecified severity, with other behavioral disturbance: Secondary | ICD-10-CM | POA: Diagnosis not present

## 2023-05-09 LAB — RESPIRATORY PANEL BY PCR

## 2023-05-09 LAB — COMPREHENSIVE METABOLIC PANEL
ALT: 50 U/L — ABNORMAL HIGH (ref 0–44)
AST: 24 U/L (ref 15–41)
Albumin: 3.1 g/dL — ABNORMAL LOW (ref 3.5–5.0)
Alkaline Phosphatase: 89 U/L (ref 38–126)
Anion gap: 12 (ref 5–15)
BUN: 12 mg/dL (ref 8–23)
CO2: 30 mmol/L (ref 22–32)
Calcium: 8.4 mg/dL — ABNORMAL LOW (ref 8.9–10.3)
Chloride: 92 mmol/L — ABNORMAL LOW (ref 98–111)
Creatinine, Ser: 0.77 mg/dL (ref 0.44–1.00)
GFR, Estimated: >60 mL/min (ref >60)
Glucose, Bld: 102 mg/dL — ABNORMAL HIGH (ref 70–99)
Potassium: 3.4 mmol/L — ABNORMAL LOW (ref 3.5–5.1)
Sodium: 134 mmol/L — ABNORMAL LOW (ref 135–145)
Total Bilirubin: 0.7 mg/dL (ref 0.0–1.2)
Total Protein: 5.6 g/dL — ABNORMAL LOW (ref 6.5–8.1)

## 2023-05-09 LAB — ECHOCARDIOGRAM COMPLETE
AR max vel: 1.01 cm2
AV Area VTI: 0.95 cm2
AV Area mean vel: 0.95 cm2
AV Mean grad: 10.7 mm[Hg]
AV Peak grad: 18.3 mm[Hg]
Ao pk vel: 2.14 m/s
Area-P 1/2: 3.79 cm2
Calc EF: 48.2 %
MV M vel: 4.71 m/s
MV Peak grad: 88.7 mm[Hg]
MV VTI: 1.12 cm2
Radius: 0.5 cm
S' Lateral: 4 cm
Single Plane A2C EF: 49.6 %
Single Plane A4C EF: 44.1 %

## 2023-05-09 LAB — CBC
HCT: 33.8 % — ABNORMAL LOW (ref 36.0–46.0)
Hemoglobin: 10.9 g/dL — ABNORMAL LOW (ref 12.0–15.0)
MCH: 27.2 pg (ref 26.0–34.0)
MCHC: 32.2 g/dL (ref 30.0–36.0)
MCV: 84.3 fL (ref 80.0–100.0)
Platelets: 325 10*3/uL (ref 150–400)
RBC: 4.01 MIL/uL (ref 3.87–5.11)
RDW: 14.4 % (ref 11.5–15.5)
WBC: 8.5 10*3/uL (ref 4.0–10.5)
nRBC: 0 % (ref 0.0–0.2)

## 2023-05-09 LAB — TSH: TSH: 0.704 u[IU]/mL (ref 0.350–4.500)

## 2023-05-09 LAB — MRSA NEXT GEN BY PCR, NASAL: MRSA by PCR Next Gen: NOT DETECTED

## 2023-05-09 MED ORDER — ONDANSETRON HCL 4 MG PO TABS
4.0000 mg | ORAL_TABLET | Freq: Once | ORAL | Status: DC
  Filled 2023-05-09: qty 1

## 2023-05-09 MED ORDER — IPRATROPIUM-ALBUTEROL 0.5-2.5 (3) MG/3ML IN SOLN: 3.0000 mL | Freq: Four times a day (QID) | RESPIRATORY_TRACT | Status: DC | PRN

## 2023-05-09 MED ORDER — QUETIAPINE FUMARATE 25 MG PO TABS
25.0000 mg | ORAL_TABLET | Freq: Once | ORAL | Status: AC | PRN
  Administered 2023-05-09: 25 mg via ORAL
  Filled 2023-05-09: qty 1

## 2023-05-09 NOTE — Progress Notes (Addendum)
 OT Screen Note  Patient Details Name: Marisa Nunez MRN: [id number] DOB: 1945-07-08   Cancelled Treatment:    Reason Eval/Treat Not Completed: OT screened, no needs identified, will sign off (Pt ambulatory with PT with supervision, has hx of dementia at baseline and lives with daughter. Called daughter to confirm CHF routine to which she reports good compliance with. No acute skilled OT needs identified at this time. OT signing off.)  05/09/2023  AB, OTR/L  Acute Rehabilitation Services  Office: [phone]   [name] 05/09/2023, 10:52 AM

## 2023-05-09 NOTE — Plan of Care (Signed)

## 2023-05-09 NOTE — Care Management Obs Status (Signed)
 MEDICARE OBSERVATION STATUS NOTIFICATION   Patient Details  Name: Marisa Nunez MRN: [id number] Date of Birth: 05-05-1945   Medicare Observation Status Notification Given:  Yes    Lockie Pares, RN 05/09/2023, 10:20 AM

## 2023-05-09 NOTE — ED Notes (Signed)
 Pt requires constant redirection and reorientation. Pt attempts to ambulate on her own without assistance and frequently exits her room.

## 2023-05-09 NOTE — ED Notes (Signed)
 Pt refused to keep the EKG leads and continuous monitoring on previously. Transferred to a recliner at this time. Pt agreed to keep monitor on. Will continue to monitor.

## 2023-05-09 NOTE — ED Notes (Signed)
 PT at bedside.

## 2023-05-09 NOTE — Evaluation (Signed)
 Physical Therapy Evaluation & Discharge Patient Details Name: Marisa Nunez MRN: [id number] DOB: [birth year] Today's Date: 05/09/2023  History of Present Illness  78 y.o. female admitted 05/08/23 with CHF exacerbation. PMH includes HTN, HLD, afib, HFrEF, CVA, suspected COPD, breast CA, dementia.   Clinical Impression  Patient evaluated by Physical Therapy with no further acute PT needs identified. Pt with h/o dementia and poor historian; called daughter who confirms pt does everything for herself at home. Today, pt ambulating in room and hallway without assist, supervision due to impaired cognition as pt wanting to leave hospital; pt A&O to self requiring frequent redirection. Discussed CHF management strategies for home with daughter. All education has been completed and the patient has no further questions. Acute PT is signing off. Thank you for this referral.    Spo2 99% on RA, HR 96; no significant SOB noted during session    If plan is discharge home, recommend the following: Supervision due to cognitive status;Direct supervision/assist for medications management;Direct supervision/assist for financial management;Assistance with cooking/housework   Can travel by private vehicle    Yes    Equipment Recommendations None recommended by PT  Recommendations for Other Services       Functional Status Assessment       Precautions / Restrictions Precautions Precautions: Other (comment) Precaution Comments: h/o dementia, wandering Restrictions Weight Bearing Restrictions Per Provider Order: No      Mobility  Bed Mobility               General bed mobility comments: received standing in front of ED room    Transfers Overall transfer level: Independent Equipment used: None               General transfer comment: indep sit<>stand from chair without armrests    Ambulation/Gait Ambulation/Gait assistance: Supervision Gait Distance (Feet): 40 Feet Assistive  device: None Gait Pattern/deviations: Step-through pattern, Decreased stride length       General Gait Details: ambulatory without DME, no apparent instability or LOB; supervision secondary to impaired cognition  Stairs            Wheelchair Mobility     Tilt Bed    Modified Rankin (Stroke Patients Only)       Balance Overall balance assessment: No apparent balance deficits (not formally assessed)                                           Pertinent Vitals/Pain Pain Assessment Pain Assessment: No/denies pain    Home Living Family/patient expects to be discharged to:: Private residence Living Arrangements: Children (daughter) Available Help at Discharge: Family;Available PRN/intermittently Type of Home: House Home Access: Stairs to enter       Home Layout: One level Home Equipment: None Additional Comments: pt unreliable historian; daughter [place]) confirms pt lives with her    Prior Function Prior Level of Function : Needs assist             Mobility Comments: daughter reports independent withotu DME ADLs Comments: daughter reports, she does everything herself - supervision due to h/o dementia. daughter cooks     Extremity/Trunk Assessment   Upper Extremity Assessment Upper Extremity Assessment: Overall WFL for tasks assessed    Lower Extremity Assessment Lower Extremity Assessment: Overall WFL for tasks assessed       Communication   Communication Communication: No apparent difficulties  Cognition Arousal:  Alert Behavior During Therapy: Restless Overall Cognitive Status: History of cognitive impairments - at baseline                                 General Comments: h/o dementia; A&O to self, able to repeat [facility] to daughter on phone later after reoriented to this at beginning of session. pt restless and focused on wanting to get home, why family isn't there, etc. poor STM, awareness and attention  requiring frequent redirection        General Comments General comments (skin integrity, edema, etc.): called pt's daughter during session in attempt to reorient pt; daughter confirms PLOF, discussed CHF management at home with pt's daughter. pt's SpO2 99% on RA, HR 96; no significant SOB noted    Exercises     Assessment/Plan    PT Assessment Patient does not need any further PT services  PT Problem List         PT Treatment Interventions      PT Goals (Current goals can be found in the Care Plan section)  Acute Rehab PT Goals PT Goal Formulation: All assessment and education complete, DC therapy    Frequency       Co-evaluation               AM-PAC PT 6 Clicks Mobility  Outcome Measure Help needed turning from your back to your side while in a flat bed without using bedrails?: None Help needed moving from lying on your back to sitting on the side of a flat bed without using bedrails?: None Help needed moving to and from a bed to a chair (including a wheelchair)?: None Help needed standing up from a chair using your arms (e.g., wheelchair or bedside chair)?: None Help needed to walk in hospital room?: A Little Help needed climbing 3-5 steps with a railing? : A Little 6 Click Score: 22    End of Session   Activity Tolerance: Patient tolerated treatment well;Other (comment) (limited by impaired cognition) Patient left: in chair;with call bell/phone within reach   PT Visit Diagnosis: Other abnormalities of gait and mobility (R26.89)    Time: 9077-9057 PT Time Calculation (min) (ACUTE ONLY): 20 min   Charges:   PT Evaluation $PT Eval Low Complexity: 1 Low   PT General Charges $$ ACUTE PT VISIT: 1 Visit       Darice Almas, PT, DPT Acute Rehabilitation Services  Personal: Secure Chat Rehab Office: [phone]  Darice LITTIE Almas 05/09/2023, 9:53 AM

## 2023-05-09 NOTE — Progress Notes (Addendum)
 HD#0 SUBJECTIVE:  Patient Summary: Marisa Nunez is a 78 y.o. person living with a history of hypertension, hyperlipidemia, persistent A-fib, dementia, HFrEF, history of CVA, suspected COPD and history of breast cancer who presented with dyspnea and is hospitalized for congestive heart failure exacerbation   Overnight Events: Admitted to hospital  Interim History: She reports feeling well this morning.  She was resting in bed at time of interview.  She did not complain of any dyspnea, pain, swelling, chest pain.  OBJECTIVE:  Vital Signs: Vitals:   05/09/23 1010 05/09/23 1045 05/09/23 1154 05/09/23 1200  BP: 135/78 112/65  (!) 151/139  Pulse: 75 90  87  Resp: 15 15 (!) 21   Temp:    (!) 97.5 F (36.4 C)  TempSrc:    Oral  SpO2: 94% 98%  100%   Supplemental O2: Room Air SpO2: 100 % O2 Flow Rate (L/min): 3 L/min  There were no vitals filed for this visit.   Intake/Output Summary (Last 24 hours) at 05/09/2023 1319 Last data filed at 05/09/2023 1018 Gross per 24 hour  Intake --  Output 550 ml  Net -550 ml   Net IO Since Admission: -550 mL [05/09/23 1319]  Physical Exam: Physical Exam Constitutional:      General: She is not in acute distress.    Appearance: She is not ill-appearing.  Cardiovascular:     Rate and Rhythm: Normal rate and regular rhythm.  Pulmonary:     Effort: Pulmonary effort is normal.     Breath sounds: Rales present.     Comments: Mild rales R lung base Musculoskeletal:     Right lower leg: No edema.     Left lower leg: No edema.  Skin:    General: Skin is warm.  Neurological:     Mental Status: She is alert and oriented to person, place, and time.     Patient Lines/Drains/Airways Status     Active Line/Drains/Airways     Name Placement date Placement time Site Days   Peripheral IV 05/08/23 18 G Left Antecubital 05/08/23  1219  Antecubital  1             ASSESSMENT/PLAN:  Assessment: Principal Problem:   Acute on chronic  HFrEF (heart failure with reduced ejection fraction) (HCC) Active Problems:   Essential hypertension   Mixed hyperlipidemia   Persistent atrial fibrillation (HCC)   Dementia with behavioral disturbance (HCC)   Pleural effusion on right  Marisa Nunez is a 78 y.o. person living with a history of hypertension, hyperlipidemia, persistent A-fib, dementia, HFrEF, history of CVA, suspected COPD and history of breast cancer who presented with dyspnea and is hospitalized for congestive heart failure exacerbation   Plan: Hypoxic respiratory failure due to HFrEF exacerbation Presumed COPD without exacerbation Pleural Effusion Oxygen  requirement has resolved.  On exam this morning, she is resting flat in bed without oxygen  or orthopnea.  She is status post diuresis yesterday.  Her output is not available for assessment but her clinical exam is certainly improved.  She only has very fine Rales on the right and she has no lower extremity edema.  As the cause of her exacerbation is unclear, we will pursue updated echo. - Lasix  40 mg p.o. Home dose is 20mg . - DuoNebs every 6 hours as needed - Echo today   Chronic conditions:  Hypertension Persistent A-fib -Metoprolol  50 daily -Eliquis  5 mg twice daily   Dementia with associated behavioral disturbances. -Resume home Celexa  20, Depakote   sprinkle, melatonin 10 mg nightly, memantine  20 mg twice daily, Seroquel  50 twice daily. -Patient has reported paradoxical reaction to Ativan , avoid benzodiazepines.  Best Practice: Diet: Cardiac diet IVF: None VTE: DOAC Code: DNR, May Intubate DISPO: Anticipated discharge tomorrow to Home pending  cardiac decongestion .  Signature: Lonni Africa, D.O.  Internal Medicine Resident, PGY-1 [facility] Internal Medicine Residency  Pager: [phone] 1:19 PM, 05/09/2023   Please contact the on call pager after 5 pm and on weekends at [phone].

## 2023-05-09 NOTE — ED Notes (Signed)
 Patient laying in bed resting again, respirations are present, even, and unlabored.

## 2023-05-09 NOTE — ED Notes (Signed)
 Patient is restless, keeping taking her monitoring off; redirectable, but gets up every few minutes in need of something & does not want to stay in her room. MD paged.

## 2023-05-09 NOTE — ED Notes (Signed)
 ED TO INPATIENT HANDOFF REPORT  ED Nurse Name and Phone #: Woodie PEAK [phone]  S Name/Age/Gender Marisa Nunez 78 y.o. female Room/Bed: 006C/006C  Code Status   Code Status: Do not attempt resuscitation (DNR) PRE-ARREST INTERVENTIONS DESIRED  Home/SNF/Other Home Patient oriented to: self and situation Is this baseline? Yes   Triage Complete: Triage complete  Chief Complaint Acute on chronic HFrEF (heart failure with reduced ejection fraction) (HCC) [I50.23]  Triage Note No notes on file   Allergies Allergies  Allergen Reactions   Influenza Virus Vaccine Hives   Myrbetriq  [Mirabegron  Er] Other (See Comments)    Confusion, agitation, hallucinations, psychosis   Pneumococcal Vaccine Hives   Ativan  [Lorazepam ] Other (See Comments)    Made the patient agitated,  per daughter     Level of Care/Admitting Diagnosis ED Disposition     ED Disposition  Admit   Condition  --   Comment  Hospital Area: [facility] [[id number]]  Level of Care: Telemetry Medical [[id number]]  May place patient in observation at [facility] or [facility] if equivalent level of care is available:: No  Covid Evaluation: Asymptomatic - no recent exposure (last 10 days) testing not required  Diagnosis: Acute on chronic HFrEF (heart failure with reduced ejection fraction) [facility]) [[id number]]  Admitting Physician: KARNA FELLOWS [[id number]]  Attending Physician: KARNA FELLOWS [[id number]]          B Medical/Surgery History Past Medical History:  Diagnosis Date   Anxiety    Breast cancer (HCC)    Chronic combined systolic and diastolic CHF (congestive heart failure) (HCC)    a. Echo 11/2021: LVEF of 40-45% with global hypokinesis, mildly reduced RV systolic function, and moderate MR   COPD (chronic obstructive pulmonary disease) (HCC)    Dementia (HCC)    Depression    History of TIA 10/30/2020   History of tobacco use 05/06/2017   Formatting of this note might be different from the original.   Greater than 40 pack year history. Quit in 2015.     HTN (hypertension)    Paroxysmal atrial fibrillation (HCC) 2017   Stroke [facility])    Past Surgical History:  Procedure Laterality Date   ABDOMINAL HYSTERECTOMY     CARDIOVERSION N/A 02/19/2022   Procedure: CARDIOVERSION;  Surgeon: Hobart Powell BRAVO, MD;  Location: [facility] ENDOSCOPY;  Service: Cardiovascular;  Laterality: N/A;   REVERSE SHOULDER ARTHROPLASTY Right 10/21/2021   Procedure: RIGHT REVERSE TOTAL SHOULDER ARTHROPLASTY;  Surgeon: Cristy Bonner DASEN, MD;  Location: [facility] OR;  Service: Orthopedics;  Laterality: Right;  allen bed, spider, tornier (rep aware), open shoulder tray.     A IV Location/Drains/Wounds Patient Lines/Drains/Airways Status     Active Line/Drains/Airways     Name Placement date Placement time Site Days   Peripheral IV 05/08/23 18 G Left Antecubital 05/08/23  1219  Antecubital  1            Intake/Output Last 24 hours  Intake/Output Summary (Last 24 hours) at 05/09/2023 1120 Last data filed at 05/09/2023 1018 Gross per 24 hour  Intake --  Output 550 ml  Net -550 ml    Labs/Imaging Results for orders placed or performed during the hospital encounter of 05/08/23 (from the past 48 hours)  CBC with Differential     Status: Abnormal   Collection Time: 05/08/23  1:44 PM  Result Value Ref Range   WBC 11.5 (H) 4.0 - 10.5 K/uL   RBC 4.13 3.87 - 5.11 MIL/uL   Hemoglobin 11.5 (L)  12.0 - 15.0 g/dL   HCT 64.7 (L) 63.9 - 53.9 %   MCV 85.2 80.0 - 100.0 fL   MCH 27.8 26.0 - 34.0 pg   MCHC 32.7 30.0 - 36.0 g/dL   RDW 85.4 88.4 - 84.4 %   Platelets 371 150 - 400 K/uL   nRBC 0.0 0.0 - 0.2 %   Neutrophils Relative % 83 %   Neutro Abs 9.5 (H) 1.7 - 7.7 K/uL   Lymphocytes Relative 7 %   Lymphs Abs 0.9 0.7 - 4.0 K/uL   Monocytes Relative 9 %   Monocytes Absolute 1.0 0.1 - 1.0 K/uL   Eosinophils Relative 0 %   Eosinophils Absolute 0.0 0.0 - 0.5 K/uL   Basophils Relative 0 %   Basophils Absolute 0.1 0.0 - 0.1 K/uL    Immature Granulocytes 1 %   Abs Immature Granulocytes 0.07 0.00 - 0.07 K/uL    Comment: Performed at [facility], [address], [place], [place] [address]  Comprehensive metabolic panel     Status: Abnormal   Collection Time: 05/08/23  1:44 PM  Result Value Ref Range   Sodium 133 (L) 135 - 145 mmol/L   Potassium 3.5 3.5 - 5.1 mmol/L   Chloride 92 (L) 98 - 111 mmol/L   CO2 30 22 - 32 mmol/L   Glucose, Bld 104 (H) 70 - 99 mg/dL    Comment: Glucose reference range applies only to samples taken after fasting for at least 8 hours.   BUN 11 8 - 23 mg/dL   Creatinine, Ser 9.19 0.44 - 1.00 mg/dL   Calcium  8.9 8.9 - 10.3 mg/dL   Total Protein 6.0 (L) 6.5 - 8.1 g/dL   Albumin 3.3 (L) 3.5 - 5.0 g/dL   AST 40 15 - 41 U/L   ALT 65 (H) 0 - 44 U/L   Alkaline Phosphatase 104 38 - 126 U/L   Total Bilirubin 1.1 0.0 - 1.2 mg/dL   GFR, Estimated >39 >39 mL/min    Comment: (NOTE) Calculated using the CKD-EPI Creatinine Equation (2021)    Anion gap 11 5 - 15    Comment: Performed at [facility], [address] [place], [place] [address]  Magnesium      Status: None   Collection Time: 05/08/23  1:44 PM  Result Value Ref Range   Magnesium  1.8 1.7 - 2.4 mg/dL    Comment: Performed at [facility], [address], [place], [place] [address]  D-dimer, quantitative     Status: Abnormal   Collection Time: 05/08/23  1:44 PM  Result Value Ref Range   D-Dimer, Quant 0.97 (H) 0.00 - 0.50 ug/mL-FEU    Comment: (NOTE) At the manufacturer cut-off value of 0.5 g/mL FEU, this assay has a negative predictive value of 95-100%.This assay is intended for use in conjunction with a clinical pretest probability (PTP) assessment model to exclude pulmonary embolism (PE) and deep venous thrombosis (DVT) in outpatients suspected of PE or DVT. Results should be correlated with clinical presentation. Performed at [facility], [address], [place], [place] [address]   Brain natriuretic  peptide     Status: Abnormal   Collection Time: 05/08/23  1:44 PM  Result Value Ref Range   B Natriuretic Peptide 1,212.3 (H) 0.0 - 100.0 pg/mL    Comment: Performed at [facility], [address], [place], [place] [address]  Troponin I (High Sensitivity)     Status: Abnormal  Collection Time: 05/08/23  3:57 PM  Result Value Ref Range   Troponin I (High Sensitivity) 21 (H) <18 ng/L    Comment: (NOTE) Elevated high sensitivity troponin I (hsTnI) values and significant  changes across serial measurements may suggest ACS but many other  chronic and acute conditions are known to elevate hsTnI results.  Refer to the Links section for chest pain algorithms and additional  guidance. Performed at [facility], [address], [place], [place] [address]   Troponin I (High Sensitivity)     Status: None   Collection Time: 05/08/23  7:39 PM  Result Value Ref Range   Troponin I (High Sensitivity) 15 <18 ng/L    Comment: (NOTE) Elevated high sensitivity troponin I (hsTnI) values and significant  changes across serial measurements may suggest ACS but many other  chronic and acute conditions are known to elevate hsTnI results.  Refer to the Links section for chest pain algorithms and additional  guidance. Performed at [facility], [address], [place], [place] [address]   Valproic acid  level     Status: Abnormal   Collection Time: 05/08/23  7:39 PM  Result Value Ref Range   Valproic Acid  Lvl 11 (L) 50.0 - 100.0 ug/mL    Comment: Performed at [facility], [address], [place], [place] [address]  Respiratory (~20 pathogens) panel by PCR     Status: None   Collection Time: 05/08/23  9:14 PM   Specimen: Nasopharyngeal Swab; Respiratory  Result Value Ref Range   Adenovirus NOT DETECTED NOT DETECTED   Coronavirus 229E NOT DETECTED NOT DETECTED    Comment: (NOTE) The Coronavirus on the Respiratory Panel, DOES NOT test for the novel  Coronavirus (2019 nCoV)     Coronavirus HKU1 NOT DETECTED NOT DETECTED   Coronavirus NL63 NOT DETECTED NOT DETECTED   Coronavirus OC43 NOT DETECTED NOT DETECTED   Metapneumovirus NOT DETECTED NOT DETECTED   Rhinovirus / Enterovirus NOT DETECTED NOT DETECTED   Influenza A NOT DETECTED NOT DETECTED   Influenza B NOT DETECTED NOT DETECTED   Parainfluenza Virus 1 NOT DETECTED NOT DETECTED   Parainfluenza Virus 2 NOT DETECTED NOT DETECTED   Parainfluenza Virus 3 NOT DETECTED NOT DETECTED   Parainfluenza Virus 4 NOT DETECTED NOT DETECTED   Respiratory Syncytial Virus NOT DETECTED NOT DETECTED   Bordetella pertussis NOT DETECTED NOT DETECTED   Bordetella Parapertussis NOT DETECTED NOT DETECTED   Chlamydophila pneumoniae NOT DETECTED NOT DETECTED   Mycoplasma pneumoniae NOT DETECTED NOT DETECTED    Comment: Performed at [facility], [address], [place], [place] [address]  TSH     Status: None   Collection Time: 05/09/23  5:00 AM  Result Value Ref Range   TSH 0.704 0.350 - 4.500 uIU/mL    Comment: Performed by a 3rd Generation assay with a functional sensitivity of <=0.01 uIU/mL. Performed at [facility], [address], [place], [place] [address]   Comprehensive metabolic panel     Status: Abnormal   Collection Time: 05/09/23  5:00 AM  Result Value Ref Range   Sodium 134 (L) 135 - 145 mmol/L   Potassium 3.4 (L) 3.5 - 5.1 mmol/L   Chloride 92 (L) 98 - 111 mmol/L   CO2 30 22 - 32 mmol/L   Glucose, Bld 102 (H) 70 - 99 mg/dL    Comment: Glucose reference range applies only to samples taken after fasting for at least 8 hours.  BUN 12 8 - 23 mg/dL   Creatinine, Ser 9.22 0.44 - 1.00 mg/dL   Calcium  8.4 (L) 8.9 - 10.3 mg/dL   Total Protein 5.6 (L) 6.5 - 8.1 g/dL   Albumin 3.1 (L) 3.5 - 5.0 g/dL   AST 24 15 - 41 U/L   ALT 50 (H) 0 - 44 U/L   Alkaline Phosphatase 89 38 - 126 U/L   Total Bilirubin 0.7 0.0 - 1.2 mg/dL   GFR, Estimated >39 >39 mL/min    Comment: (NOTE) Calculated using the CKD-EPI  Creatinine Equation (2021)    Anion gap 12 5 - 15    Comment: Performed at [facility], [address] [place], [place] [address]  CBC     Status: Abnormal   Collection Time: 05/09/23  5:00 AM  Result Value Ref Range   WBC 8.5 4.0 - 10.5 K/uL   RBC 4.01 3.87 - 5.11 MIL/uL   Hemoglobin 10.9 (L) 12.0 - 15.0 g/dL   HCT 66.1 (L) 63.9 - 53.9 %   MCV 84.3 80.0 - 100.0 fL   MCH 27.2 26.0 - 34.0 pg   MCHC 32.2 30.0 - 36.0 g/dL   RDW 85.5 88.4 - 84.4 %   Platelets 325 150 - 400 K/uL   nRBC 0.0 0.0 - 0.2 %    Comment: Performed at [facility], [address], [place], [place] [address]   DG Chest Portable 1 View Result Date: 05/08/2023 CLINICAL DATA:  Upper respiratory infection symptoms. Shortness of breath. EXAM: PORTABLE CHEST 1 VIEW COMPARISON:  Chest radiograph dated 05/07/2023. FINDINGS: Overall improved vascular congestion and edema compared to prior radiograph. Small right pleural effusion and associated atelectasis or infiltrate the right lung base similar to prior radiograph. No pneumothorax. Stable cardiac silhouette. Atherosclerotic calcification of the aorta. No acute osseous pathology. Degenerative changes of the spine. Right shoulder arthroplasty. IMPRESSION: Improved CHF with similar small right pleural effusion. Electronically Signed   By: Vanetta Chou M.D.   On: 05/08/2023 12:40    Pending Labs Unresulted Labs (From admission, onward)    None       Vitals/Pain Today's Vitals   05/09/23 0800 05/09/23 0955 05/09/23 1010 05/09/23 1045  BP: (!) 133/92  135/78 112/65  Pulse: 79  75 90  Resp: 17  15 15   Temp:  (!) 97.4 F (36.3 C)    TempSrc:  Oral    SpO2: 93%  94% 98%  PainSc:        Isolation Precautions Droplet precaution  Medications Medications  melatonin tablet 10 mg (10 mg Oral Given 05/08/23 2119)  acetaminophen  (TYLENOL ) tablet 650 mg (has no administration in time range)    Or  acetaminophen  (TYLENOL ) suppository 650 mg (has no  administration in time range)  polyethylene glycol (MIRALAX  / GLYCOLAX ) packet 17 g (has no administration in time range)  apixaban  (ELIQUIS ) tablet 5 mg (5 mg Oral Given 05/09/23 1006)  benzonatate  (TESSALON ) capsule 100 mg (has no administration in time range)  citalopram  (CELEXA ) tablet 20 mg (20 mg Oral Given 05/09/23 1006)  divalproex  (DEPAKOTE  SPRINKLE) capsule 250 mg (250 mg Oral Given 05/09/23 1007)  furosemide  (LASIX ) tablet 40 mg (40 mg Oral Given 05/09/23 1006)  memantine  (NAMENDA ) tablet 20 mg (20 mg Oral Given 05/09/23 1018)  metoprolol  succinate (TOPROL -XL) 24 hr tablet 50 mg (50 mg Oral Given 05/09/23 1006)  QUEtiapine  (SEROQUEL ) tablet 50 mg (50 mg Oral Given 05/09/23 1006)  ipratropium-albuterol  (DUONEB) 0.5-2.5 (3) MG/3ML nebulizer  solution 3 mL (3 mLs Nebulization Given 05/09/23 1006)  ondansetron  (ZOFRAN ) tablet 4 mg (has no administration in time range)  ondansetron  (ZOFRAN ) injection 4 mg (4 mg Intravenous Given 05/08/23 1231)  QUEtiapine  (SEROQUEL ) tablet 25 mg (25 mg Oral Given 05/08/23 1302)  ipratropium-albuterol  (DUONEB) 0.5-2.5 (3) MG/3ML nebulizer solution 3 mL (3 mLs Nebulization Given 05/08/23 1328)  methylPREDNISolone  sodium succinate (SOLU-MEDROL ) 125 mg/2 mL injection 125 mg (125 mg Intravenous Given 05/08/23 1328)  QUEtiapine  (SEROQUEL ) tablet 25 mg (25 mg Oral Given 05/08/23 1348)  metoprolol  tartrate (LOPRESSOR ) injection 5 mg (5 mg Intravenous Given 05/08/23 1552)  furosemide  (LASIX ) injection 60 mg (60 mg Intravenous Given 05/08/23 1718)  QUEtiapine  (SEROQUEL ) tablet 25 mg (25 mg Oral Given 05/08/23 1717)  QUEtiapine  (SEROQUEL ) tablet 25 mg (25 mg Oral Given 05/09/23 0322)    Mobility walks     Focused Assessments Renal Assessment Handoff:   R Recommendations: See Admitting Provider Note  Report given to:   Additional Notes:

## 2023-05-09 NOTE — ED Notes (Signed)
 ED TO INPATIENT HANDOFF REPORT  ED Nurse Name and Phone #: Albino [phone]  S Name/Age/Gender Marisa Nunez 78 y.o. female Room/Bed: 006C/006C  Code Status   Code Status: Do not attempt resuscitation (DNR) PRE-ARREST INTERVENTIONS DESIRED  Home/SNF/Other Skilled nursing facility Patient oriented to: self Is this baseline? Yes   Triage Complete: Triage complete  Chief Complaint Acute on chronic HFrEF (heart failure with reduced ejection fraction) (HCC) [I50.23]  Triage Note No notes on file   Allergies Allergies  Allergen Reactions   Influenza Virus Vaccine Hives   Myrbetriq  [Mirabegron  Er] Other (See Comments)    Confusion, agitation, hallucinations, psychosis   Pneumococcal Vaccine Hives   Ativan  [Lorazepam ] Other (See Comments)    Made the patient agitated,  per daughter     Level of Care/Admitting Diagnosis ED Disposition     ED Disposition  Admit   Condition  --   Comment  Hospital Area: [facility] [[id number]]  Level of Care: Telemetry Medical [104]  May place patient in observation at [facility] or [facility] if equivalent level of care is available:: No  Covid Evaluation: Asymptomatic - no recent exposure (last 10 days) testing not required  Diagnosis: Acute on chronic HFrEF (heart failure with reduced ejection fraction) [facility]) [[id number]]  Admitting Physician: KARNA FELLOWS [[id number]]  Attending Physician: KARNA FELLOWS [[id number]]          B Medical/Surgery History Past Medical History:  Diagnosis Date   Anxiety    Breast cancer (HCC)    Chronic combined systolic and diastolic CHF (congestive heart failure) (HCC)    a. Echo 11/2021: LVEF of 40-45% with global hypokinesis, mildly reduced RV systolic function, and moderate MR   COPD (chronic obstructive pulmonary disease) (HCC)    Dementia (HCC)    Depression    History of TIA 10/30/2020   History of tobacco use 05/06/2017   Formatting of this note might be different from the  original.  Greater than 40 pack year history. Quit in 2015.     HTN (hypertension)    Paroxysmal atrial fibrillation (HCC) 2017   Stroke [facility])    Past Surgical History:  Procedure Laterality Date   ABDOMINAL HYSTERECTOMY     CARDIOVERSION N/A 02/19/2022   Procedure: CARDIOVERSION;  Surgeon: Hobart Powell BRAVO, MD;  Location: [facility] ENDOSCOPY;  Service: Cardiovascular;  Laterality: N/A;   REVERSE SHOULDER ARTHROPLASTY Right 10/21/2021   Procedure: RIGHT REVERSE TOTAL SHOULDER ARTHROPLASTY;  Surgeon: Cristy Bonner DASEN, MD;  Location: [facility] OR;  Service: Orthopedics;  Laterality: Right;  allen bed, spider, tornier (rep aware), open shoulder tray.     A IV Location/Drains/Wounds Patient Lines/Drains/Airways Status     Active Line/Drains/Airways     Name Placement date Placement time Site Days   Peripheral IV 05/08/23 18 G Left Antecubital 05/08/23  1219  Antecubital  1            Intake/Output Last 24 hours No intake or output data in the 24 hours ending 05/09/23 0357  Labs/Imaging Results for orders placed or performed during the hospital encounter of 05/08/23 (from the past 48 hours)  CBC with Differential     Status: Abnormal   Collection Time: 05/08/23  1:44 PM  Result Value Ref Range   WBC 11.5 (H) 4.0 - 10.5 K/uL   RBC 4.13 3.87 - 5.11 MIL/uL   Hemoglobin 11.5 (L) 12.0 - 15.0 g/dL   HCT 64.7 (L) 63.9 - 53.9 %   MCV 85.2 80.0 - 100.0  fL   MCH 27.8 26.0 - 34.0 pg   MCHC 32.7 30.0 - 36.0 g/dL   RDW 85.4 88.4 - 84.4 %   Platelets 371 150 - 400 K/uL   nRBC 0.0 0.0 - 0.2 %   Neutrophils Relative % 83 %   Neutro Abs 9.5 (H) 1.7 - 7.7 K/uL   Lymphocytes Relative 7 %   Lymphs Abs 0.9 0.7 - 4.0 K/uL   Monocytes Relative 9 %   Monocytes Absolute 1.0 0.1 - 1.0 K/uL   Eosinophils Relative 0 %   Eosinophils Absolute 0.0 0.0 - 0.5 K/uL   Basophils Relative 0 %   Basophils Absolute 0.1 0.0 - 0.1 K/uL   Immature Granulocytes 1 %   Abs Immature Granulocytes 0.07 0.00 - 0.07 K/uL     Comment: Performed at [facility], [address], [place], [place] [address]  Comprehensive metabolic panel     Status: Abnormal   Collection Time: 05/08/23  1:44 PM  Result Value Ref Range   Sodium 133 (L) 135 - 145 mmol/L   Potassium 3.5 3.5 - 5.1 mmol/L   Chloride 92 (L) 98 - 111 mmol/L   CO2 30 22 - 32 mmol/L   Glucose, Bld 104 (H) 70 - 99 mg/dL    Comment: Glucose reference range applies only to samples taken after fasting for at least 8 hours.   BUN 11 8 - 23 mg/dL   Creatinine, Ser 9.19 0.44 - 1.00 mg/dL   Calcium  8.9 8.9 - 10.3 mg/dL   Total Protein 6.0 (L) 6.5 - 8.1 g/dL   Albumin 3.3 (L) 3.5 - 5.0 g/dL   AST 40 15 - 41 U/L   ALT 65 (H) 0 - 44 U/L   Alkaline Phosphatase 104 38 - 126 U/L   Total Bilirubin 1.1 0.0 - 1.2 mg/dL   GFR, Estimated >39 >39 mL/min    Comment: (NOTE) Calculated using the CKD-EPI Creatinine Equation (2021)    Anion gap 11 5 - 15    Comment: Performed at [facility], [address], [place], [place] [address]  Magnesium      Status: None   Collection Time: 05/08/23  1:44 PM  Result Value Ref Range   Magnesium  1.8 1.7 - 2.4 mg/dL    Comment: Performed at [facility], [address], [place], [place] [address]  D-dimer, quantitative     Status: Abnormal   Collection Time: 05/08/23  1:44 PM  Result Value Ref Range   D-Dimer, Quant 0.97 (H) 0.00 - 0.50 ug/mL-FEU    Comment: (NOTE) At the manufacturer cut-off value of 0.5 g/mL FEU, this assay has a negative predictive value of 95-100%.This assay is intended for use in conjunction with a clinical pretest probability (PTP) assessment model to exclude pulmonary embolism (PE) and deep venous thrombosis (DVT) in outpatients suspected of PE or DVT. Results should be correlated with clinical presentation. Performed at [facility], [address] [place], [place] [address]   Brain natriuretic peptide     Status: Abnormal   Collection Time: 05/08/23  1:44 PM  Result Value  Ref Range   B Natriuretic Peptide 1,212.3 (H) 0.0 - 100.0 pg/mL    Comment: Performed at [facility], [address] [place], [place] [address]  Troponin I (High Sensitivity)     Status: Abnormal   Collection Time: 05/08/23  3:57 PM  Result Value Ref Range   Troponin I (High Sensitivity) 21 (H) <  18 ng/L    Comment: (NOTE) Elevated high sensitivity troponin I (hsTnI) values and significant  changes across serial measurements may suggest ACS but many other  chronic and acute conditions are known to elevate hsTnI results.  Refer to the Links section for chest pain algorithms and additional  guidance. Performed at [facility], [address], [place], [place] [address]   Troponin I (High Sensitivity)     Status: None   Collection Time: 05/08/23  7:39 PM  Result Value Ref Range   Troponin I (High Sensitivity) 15 <18 ng/L    Comment: (NOTE) Elevated high sensitivity troponin I (hsTnI) values and significant  changes across serial measurements may suggest ACS but many other  chronic and acute conditions are known to elevate hsTnI results.  Refer to the Links section for chest pain algorithms and additional  guidance. Performed at [facility], [address] [place], [place] [address]   Valproic acid  level     Status: Abnormal   Collection Time: 05/08/23  7:39 PM  Result Value Ref Range   Valproic Acid  Lvl 11 (L) 50.0 - 100.0 ug/mL    Comment: Performed at [facility], [address] [place], [place] [address]  Respiratory (~20 pathogens) panel by PCR     Status: None   Collection Time: 05/08/23  9:14 PM   Specimen: Nasopharyngeal Swab; Respiratory  Result Value Ref Range   Adenovirus NOT DETECTED NOT DETECTED   Coronavirus 229E NOT DETECTED NOT DETECTED    Comment: (NOTE) The Coronavirus on the Respiratory Panel, DOES NOT test for the novel  Coronavirus (2019 nCoV)    Coronavirus HKU1 NOT DETECTED NOT DETECTED   Coronavirus NL63 NOT DETECTED  NOT DETECTED   Coronavirus OC43 NOT DETECTED NOT DETECTED   Metapneumovirus NOT DETECTED NOT DETECTED   Rhinovirus / Enterovirus NOT DETECTED NOT DETECTED   Influenza A NOT DETECTED NOT DETECTED   Influenza B NOT DETECTED NOT DETECTED   Parainfluenza Virus 1 NOT DETECTED NOT DETECTED   Parainfluenza Virus 2 NOT DETECTED NOT DETECTED   Parainfluenza Virus 3 NOT DETECTED NOT DETECTED   Parainfluenza Virus 4 NOT DETECTED NOT DETECTED   Respiratory Syncytial Virus NOT DETECTED NOT DETECTED   Bordetella pertussis NOT DETECTED NOT DETECTED   Bordetella Parapertussis NOT DETECTED NOT DETECTED   Chlamydophila pneumoniae NOT DETECTED NOT DETECTED   Mycoplasma pneumoniae NOT DETECTED NOT DETECTED    Comment: Performed at [facility] Lab, [address] [place], [place] [address]   DG Chest Portable 1 View Result Date: 05/08/2023 CLINICAL DATA:  Upper respiratory infection symptoms. Shortness of breath. EXAM: PORTABLE CHEST 1 VIEW COMPARISON:  Chest radiograph dated 05/07/2023. FINDINGS: Overall improved vascular congestion and edema compared to prior radiograph. Small right pleural effusion and associated atelectasis or infiltrate the right lung base similar to prior radiograph. No pneumothorax. Stable cardiac silhouette. Atherosclerotic calcification of the aorta. No acute osseous pathology. Degenerative changes of the spine. Right shoulder arthroplasty. IMPRESSION: Improved CHF with similar small right pleural effusion. Electronically Signed   By: Vanetta Chou M.D.   On: 05/08/2023 12:40    Pending Labs Unresulted Labs (From admission, onward)     Start     Ordered   05/09/23 0500  TSH  Tomorrow morning,   R        05/08/23 1922   05/09/23 0500  Comprehensive metabolic panel  Tomorrow morning,   R  05/08/23 1922   05/09/23 0500  CBC  Tomorrow morning,   R        05/08/23 1922            Vitals/Pain Today's Vitals   05/09/23 0200 05/09/23 0215 05/09/23 0230 05/09/23 0300   BP:    117/82  Pulse:    60  Resp: 18 18 15  (!) 21  Temp:    98.9 F (37.2 C)  TempSrc:    Oral  SpO2:    96%  PainSc:        Isolation Precautions Droplet precaution  Medications Medications  melatonin tablet 10 mg (10 mg Oral Given 05/08/23 2119)  acetaminophen  (TYLENOL ) tablet 650 mg (has no administration in time range)    Or  acetaminophen  (TYLENOL ) suppository 650 mg (has no administration in time range)  polyethylene glycol (MIRALAX  / GLYCOLAX ) packet 17 g (has no administration in time range)  apixaban  (ELIQUIS ) tablet 5 mg (5 mg Oral Given 05/08/23 2119)  benzonatate  (TESSALON ) capsule 100 mg (has no administration in time range)  citalopram  (CELEXA ) tablet 20 mg (20 mg Oral Given 05/08/23 2119)  divalproex  (DEPAKOTE  SPRINKLE) capsule 250 mg (250 mg Oral Given 05/08/23 2119)  furosemide  (LASIX ) tablet 40 mg (has no administration in time range)  memantine  (NAMENDA ) tablet 20 mg (20 mg Oral Given 05/08/23 2119)  metoprolol  succinate (TOPROL -XL) 24 hr tablet 50 mg (50 mg Oral Given 05/08/23 2119)  QUEtiapine  (SEROQUEL ) tablet 50 mg (has no administration in time range)  ipratropium-albuterol  (DUONEB) 0.5-2.5 (3) MG/3ML nebulizer solution 3 mL (3 mLs Nebulization Not Given 05/09/23 0242)  ondansetron  (ZOFRAN ) injection 4 mg (4 mg Intravenous Given 05/08/23 1231)  QUEtiapine  (SEROQUEL ) tablet 25 mg (25 mg Oral Given 05/08/23 1302)  ipratropium-albuterol  (DUONEB) 0.5-2.5 (3) MG/3ML nebulizer solution 3 mL (3 mLs Nebulization Given 05/08/23 1328)  methylPREDNISolone  sodium succinate (SOLU-MEDROL ) 125 mg/2 mL injection 125 mg (125 mg Intravenous Given 05/08/23 1328)  QUEtiapine  (SEROQUEL ) tablet 25 mg (25 mg Oral Given 05/08/23 1348)  metoprolol  tartrate (LOPRESSOR ) injection 5 mg (5 mg Intravenous Given 05/08/23 1552)  furosemide  (LASIX ) injection 60 mg (60 mg Intravenous Given 05/08/23 1718)  QUEtiapine  (SEROQUEL ) tablet 25 mg (25 mg Oral Given 05/08/23 1717)  QUEtiapine  (SEROQUEL ) tablet  25 mg (25 mg Oral Given 05/09/23 0322)    Mobility walks     Focused Assessments Pulmonary Assessment Handoff:  Lung sounds: Bilateral Breath Sounds: Coarse crackles O2 Device: Room Air     R Recommendations: See Admitting Provider Note  Report given to:   Additional Notes: na

## 2023-05-09 NOTE — ED Notes (Signed)
 Patient resting in bed at this time; respirations are present, unlabored and even.  RN spoke with MD Tobie - informed MD that patient is restless and uncooperative with keeping her monitoring on. MD states that patient is stable, and okay with documenting only HR & RR at this time; no other new orders at this time.

## 2023-05-09 NOTE — Care Management (Signed)
 Transition of Care [facility]) - Inpatient Brief Assessment   Patient Details  Name: Marisa Nunez MRN: [id number] Date of Birth: 04/05/1946  Transition of Care [facility]) CM/SW Contact:    [name] JAYSON Canary, RN Phone Number: 05/09/2023, 10:21 AM   Clinical Narrative: 78 year old with COPD dementia  Lives at home with her daughter and SIL who take care of her 24/7. She has already had a referral to  [facility], they are interested in getting her into memory care.  Daughter [name] cites that her mom makes too much money for Medicaid,  cannot find a memory care as it is too expensive out of pocket.   No TOC needs at this time, the patient will be discussed in ID progressive rounds. If a need is identified please place a TOC consult    Transition of Care Asessment: Insurance and Status: Insurance coverage has been reviewed Patient has primary care physician: Yes Home environment has been reviewed: Lives with daughter who takes care of her Prior level of function:: Independent Prior/Current Home Services: No current home services Social Drivers of Health Review: SDOH reviewed no interventions necessary Readmission risk has been reviewed: Yes Transition of care needs: no transition of care needs at this time

## 2023-05-10 LAB — CBC
HCT: 33.2 % — ABNORMAL LOW (ref 36.0–46.0)
Hemoglobin: 10.7 g/dL — ABNORMAL LOW (ref 12.0–15.0)
MCH: 27.5 pg (ref 26.0–34.0)
MCHC: 32.2 g/dL (ref 30.0–36.0)
MCV: 85.3 fL (ref 80.0–100.0)
Platelets: 319 10*3/uL (ref 150–400)
RBC: 3.89 MIL/uL (ref 3.87–5.11)
RDW: 14.4 % (ref 11.5–15.5)
WBC: 9.2 10*3/uL (ref 4.0–10.5)
nRBC: 0 % (ref 0.0–0.2)

## 2023-05-10 LAB — BASIC METABOLIC PANEL
Anion gap: 9 (ref 5–15)
BUN: 13 mg/dL (ref 8–23)
CO2: 31 mmol/L (ref 22–32)
Calcium: 8.3 mg/dL — ABNORMAL LOW (ref 8.9–10.3)
Chloride: 96 mmol/L — ABNORMAL LOW (ref 98–111)
Creatinine, Ser: 0.82 mg/dL (ref 0.44–1.00)
GFR, Estimated: >60 mL/min (ref >60)
Glucose, Bld: 86 mg/dL (ref 70–99)
Potassium: 3.2 mmol/L — ABNORMAL LOW (ref 3.5–5.1)
Sodium: 136 mmol/L (ref 135–145)

## 2023-05-10 LAB — MAGNESIUM: Magnesium: 1.8 mg/dL (ref 1.7–2.4)

## 2023-05-10 MED ORDER — POTASSIUM CHLORIDE 20 MEQ PO PACK
40.0000 meq | PACK | Freq: Once | ORAL | Status: AC
  Administered 2023-05-10: 40 meq via ORAL
  Filled 2023-05-10: qty 2

## 2023-05-10 NOTE — Discharge Summary (Signed)
 Name: Marisa Nunez MRN: [id number] DOB: 01-06-1946 78 y.o. PCP: Lovie Mliss, MD  Date of Admission: 05/08/2023 12:04 PM Date of Discharge: 05/10/2023 Attending Physician: Karna Fellows, MD  Discharge Diagnosis: 1. Principal Problem:   Acute on chronic HFrEF (heart failure with reduced ejection fraction) (HCC) Active Problems:   Essential hypertension   Mixed hyperlipidemia   Persistent atrial fibrillation (HCC)   Dementia with behavioral disturbance (HCC)   Pleural effusion on right    Discharge Medications: Allergies as of 05/10/2023       Reactions   Influenza Virus Vaccine Hives   Myrbetriq  [mirabegron  Er] Other (See Comments)   Confusion, agitation, hallucinations, psychosis   Pneumococcal Vaccine Hives   Ativan  [lorazepam ] Other (See Comments)   Made the patient agitated,  per daughter         Medication List     TAKE these medications    acetaminophen  500 MG tablet Commonly known as: TYLENOL  Take 1 tablet (500 mg total) by mouth every 6 (six) hours as needed for moderate pain, fever or mild pain.   albuterol  (2.5 MG/3ML) 0.083% nebulizer solution Commonly known as: PROVENTIL  Take 3 mLs (2.5 mg total) by nebulization every 4 (four) hours as needed for shortness of breath or wheezing.   albuterol  108 (90 Base) MCG/ACT inhaler Commonly known as: VENTOLIN  HFA Inhale 1-2 puffs into the lungs every 6 (six) hours as needed.   benzonatate  100 MG capsule Commonly known as: TESSALON  Take 1 capsule (100 mg total) by mouth every 8 (eight) hours. What changed:  when to take this reasons to take this   citalopram  20 MG tablet Commonly known as: CeleXA  Take 1 tablet (20 mg total) by mouth in the morning and at bedtime.   divalproex  125 MG capsule Commonly known as: DEPAKOTE  SPRINKLE Take 2 capsules (250 mg total) by mouth 2 (two) times daily.   Eliquis  5 MG Tabs tablet Generic drug: apixaban  TAKE 1 TABLET(5 MG) BY MOUTH TWICE DAILY   furosemide  20 MG  tablet Commonly known as: LASIX  Take 2 tablets (40 mg total) by mouth daily.   Melatonin 10 MG Tabs Take 10 mg by mouth at bedtime.   memantine  10 MG tablet Commonly known as: NAMENDA  Take 2 tablets (20 mg total) by mouth 2 (two) times daily.   metoprolol  succinate 50 MG 24 hr tablet Commonly known as: TOPROL -XL Take 1 tablet (50 mg total) by mouth daily. Take with or immediately following a meal.   multivitamin with minerals tablet Take 1 tablet by mouth daily with breakfast.   ondansetron  4 MG disintegrating tablet Commonly known as: ZOFRAN -ODT Take 4 mg by mouth every 8 (eight) hours as needed for vomiting or nausea (dissolve orally).   potassium chloride  10 MEQ tablet Commonly known as: KLOR-CON  M TAKE 1 TABLET(10 MEQ) BY MOUTH TWICE DAILY   QUEtiapine  50 MG tablet Commonly known as: SEROQUEL  Take 1 tablet (50 mg total) by mouth 2 (two) times daily.        Disposition and follow-up:   Marisa Nunez was discharged from [facility] in Good condition.  At the hospital follow up visit please address:  1.    HFrEF - Pt with new severe tricuspid regurgitation, as well as progression of mitral valve disease. Please ensure that she has seen cardiology on 1/23, and assess volume status. Currently on 40mg  Lasix  daily.   2.  Labs / imaging needed at time of follow-up: BMP  3.  Pending labs/ test  needing follow-up: NA  Follow-up Appointments:  Follow-up Information     Loni Soyla LABOR, MD Follow up on 05/21/2023.   Specialties: Cardiology, Radiology Why: Hospital Follow Up with Dr. Acharya at 8:20 AM - Please arrive 15 mins early. Contact information: [address] [place] [address] [phone]                 Hospital Course by problem list:  #Heart Failure Exacerbation  Patient presented to the emergency department after worsening dyspnea on exertion.  She does have a history of heart failure with mildly reduced  ejection fraction of 40 to 45%.  Initial lab values showed BNP of 1212, negative tropes.  Chest x-ray showed some pulmonary edema and a small right pleural effusion.  She only received 1 dose of IV Lasix  in the emergency department, and a few DuoNeb treatment while she was here.  Echo done while inpatient showed progression of her mitral valve regurgitation, and also new which severe tricuspid regurgitation.  Discussed with cardiology while on the inpatient side, and they have no recommendations for further inpatient workup.  Recommended outpatient workup.  She has follow-up with cardiology on May 21, 2023.  Ambulatory saturations were within normal limits, and patient being asymptomatic she was ready for discharge.  Did not make any adjustments to her home medication regimen.  #Hypertension #Persistent A-fib Continued metoprolol  50 mg and Eliquis  5 mg twice a day  #Dementia with associated behavioral disturbances Continued Celexa , Depakote , melatonin, memantine , and Seroquel     Discharge Exam:   BP 110/63 (BP Location: Left Arm)   Pulse 80   Temp 98.1 F (36.7 C) (Oral)   Resp 14   Ht 5' 4 (1.626 m)   LMP  (LMP Unknown)   SpO2 98%   BMI 19.74 kg/m  Discharge exam:   Constitutional: Well-appearing female, in no acute distress Cardiovascular: Normal rate and regular rhythm Pulmonary: Lungs clear to auscultation bilaterally, no rales heard MSK: No lower extremity edema noted Neurologic AAO x 3  Pertinent Labs, Studies, and Procedures:      Latest Ref Rng & Units 05/10/2023    2:03 AM 05/09/2023    5:00 AM 05/08/2023    1:44 PM  CBC  WBC 4.0 - 10.5 K/uL 9.2  8.5  11.5   Hemoglobin 12.0 - 15.0 g/dL 89.2  89.0  88.4   Hematocrit 36.0 - 46.0 % 33.2  33.8  35.2   Platelets 150 - 400 K/uL 319  325  371        Latest Ref Rng & Units 05/10/2023    2:03 AM 05/09/2023    5:00 AM 05/08/2023    1:44 PM  BMP  Glucose 70 - 99 mg/dL 86  897  895   BUN 8 - 23 mg/dL 13  12  11     Creatinine 0.44 - 1.00 mg/dL 9.17  9.22  9.19   Sodium 135 - 145 mmol/L 136  134  133   Potassium 3.5 - 5.1 mmol/L 3.2  3.4  3.5   Chloride 98 - 111 mmol/L 96  92  92   CO2 22 - 32 mmol/L 31  30  30    Calcium  8.9 - 10.3 mg/dL 8.3  8.4  8.9      Discharge Instructions: Discharge Instructions     Call MD for:  persistant nausea and vomiting   Complete by: As directed    Call MD for:  redness, tenderness, or signs of infection (pain,  swelling, redness, odor or green/yellow discharge around incision site)   Complete by: As directed    Diet - low sodium heart healthy   Complete by: As directed    Increase activity slowly   Complete by: As directed        Signed: Nelia Dirks, MD 05/10/2023, 2:12 PM   Pager: [phone]

## 2023-05-10 NOTE — TOC Transition Note (Signed)
 Transition of Care [facility]) - Discharge Note   Patient Details  Name: Marisa Nunez MRN: [id number] Date of Birth: [birth year]  [facility]) CM/SW Contact:  Robynn Eileen Hoose, RN Phone Number: 05/10/2023, 3:48 PM   Clinical Narrative:    Message from floor nurse that patient Eliquis  was going to cost $400. Patient had previous prescription for Eliquis , ineligible for free 30 day card. Provider aware, possible switch to Xarelto . 30 free card for Xarelto  along with patient assistance application for both Xarelto  and Eliquis  sent to unit to give to patient at discharge.         Patient Goals and CMS Choice            Discharge Placement                       Discharge Plan and Services Additional resources added to the After Visit Summary for                                       Social Drivers of Health (SDOH) Interventions SDOH Screenings   Food Insecurity: No Food Insecurity (05/08/2023)  Housing: Low Risk  (05/08/2023)  Transportation Needs: No Transportation Needs (05/08/2023)  Utilities: Not At Risk (05/08/2023)  Alcohol Screen: Low Risk  (01/28/2023)  Depression (PHQ2-9): Low Risk  (01/28/2023)  Financial Resource Strain: Low Risk  (01/28/2023)  Physical Activity: Sufficiently Active (01/28/2023)  Social Connections: Unknown (05/08/2023)  Stress: No Stress Concern Present (01/28/2023)  Tobacco Use: Medium Risk (05/08/2023)  Health Literacy: Adequate Health Literacy (01/28/2023)     Readmission Risk Interventions    11/28/2021   12:13 PM  Readmission Risk Prevention Plan  Transportation Screening Complete  PCP or Specialist Appt within 3-5 Days Complete  HRI or Home Care Consult Complete  Social Work Consult for Recovery Care Planning/Counseling Complete  Palliative Care Screening Not Applicable  Medication Review Ocean[employer]) Complete

## 2023-05-10 NOTE — Plan of Care (Signed)
  Problem: Clinical Measurements: Goal: Ability to maintain clinical measurements within normal limits will improve Outcome: Progressing Goal: Will remain free from infection Outcome: Progressing Goal: Diagnostic test results will improve Outcome: Progressing Goal: Respiratory complications will improve Outcome: Progressing Goal: Cardiovascular complication will be avoided Outcome: Progressing   Problem: Activity: Goal: Risk for activity intolerance will decrease Outcome: Progressing   Problem: Nutrition: Goal: Adequate nutrition will be maintained Outcome: Progressing   Problem: Elimination: Goal: Will not experience complications related to bowel motility Outcome: Progressing Goal: Will not experience complications related to urinary retention Outcome: Progressing   Problem: Pain Management: Goal: General experience of comfort will improve Outcome: Progressing   Problem: Safety: Goal: Ability to remain free from injury will improve Outcome: Progressing

## 2023-05-10 NOTE — Discharge Instructions (Addendum)
 Marisa Nunez,   It was a pleasure taking care of you in the hospital. You were brought in for shortness of breath, and it looks like one of your heart valves may be getting worse causing these symptoms. We are not changing your medications. You have follow up with the cardiologists on 05/21/23, and follow up with the [facility] on 05/23/23.  Take Care!

## 2023-05-10 NOTE — Plan of Care (Signed)

## 2023-05-10 NOTE — Progress Notes (Signed)
 SATURATION QUALIFICATIONS: (This note is used to comply with regulatory documentation for home oxygen)  Patient Saturations on Room Air at Rest = 98%  Patient Saturations on Room Air while Ambulating = 93%

## 2023-05-11 ENCOUNTER — Other Ambulatory Visit: Payer: Self-pay | Admitting: Internal Medicine

## 2023-05-11 ENCOUNTER — Telehealth: Payer: Self-pay

## 2023-05-11 ENCOUNTER — Telehealth: Payer: Self-pay | Admitting: *Deleted

## 2023-05-11 MED ORDER — RIVAROXABAN 20 MG PO TABS: 20.0000 mg | ORAL_TABLET | Freq: Every day | ORAL | 2 refills | Status: DC

## 2023-05-11 NOTE — Transitions of Care (Post Inpatient/ED Visit) (Signed)
 05/11/2023  Name: Marisa Nunez MRN: [id number] DOB: 1946/04/06  Today's TOC FU Call Status: Today's TOC FU Call Status:: Successful TOC FU Call Completed TOC FU Call Complete Date: 05/11/23 Patient's Name and Date of Birth confirmed.  Transition Care Management Follow-up Telephone Call Date of Discharge: 05/07/23 Discharge Facility: [facility]) Type of Discharge: Emergency Department Reason for ED Visit: Other: [facility]) How have you been since you were released from the hospital?: Same Any questions or concerns?: No  Items Reviewed: Did you receive and understand the discharge instructions provided?: Yes Medications obtained,verified, and reconciled?: Yes (Medications Reviewed) Any new allergies since your discharge?: Yes Dietary orders reviewed?: Yes Do you have support at home?: Yes People in Home: child(ren), adult  Medications Reviewed Today: Medications Reviewed Today     Reviewed by Emmitt Pan, LPN (Licensed Practical Nurse) on 05/11/23 at 1209  Med List Status: <None>   Medication Order Taking? Sig Documenting Provider Last Dose Status Informant  acetaminophen  (TYLENOL ) 500 MG tablet [id number] No Take 1 tablet (500 mg total) by mouth every 6 (six) hours as needed for moderate pain, fever or mild pain. Pokhrel, Vernal, MD Past Week Active Child  albuterol  (PROVENTIL ) (2.5 MG/3ML) 0.083% nebulizer solution [id number] No Take 3 mLs (2.5 mg total) by nebulization every 4 (four) hours as needed for shortness of breath or wheezing. Onuoha, Josephine C, NP Past Week Active Child  albuterol  (VENTOLIN  HFA) 108 (90 Base) MCG/ACT inhaler [id number] No Inhale 1-2 puffs into the lungs every 6 (six) hours as needed. Loreda Myla SAUNDERS, NP 05/08/2023 Morning Active Child  apixaban  (ELIQUIS ) 5 MG TABS tablet [id number] No TAKE 1 TABLET(5 MG) BY MOUTH TWICE DAILY Machen, Donis, MD 05/07/2023  7:00 PM Active Child  benzonatate  (TESSALON ) 100 MG capsule [id number] No Take 1 capsule (100 mg  total) by mouth every 8 (eight) hours.  Patient taking differently: Take 100 mg by mouth 2 (two) times daily as needed for cough.   Loreda Myla SAUNDERS, NP Past Week Active Child  citalopram  (CELEXA ) 20 MG tablet [id number] No Take 1 tablet (20 mg total) by mouth in the morning and at bedtime. Lovie Mliss, MD 05/07/2023 Morning Active Child  divalproex  (DEPAKOTE  SPRINKLE) 125 MG capsule [id number] No Take 2 capsules (250 mg total) by mouth 2 (two) times daily. Lovie Mliss, MD 05/07/2023 Morning Expired 05/08/23 2359 Child           Med Note (SATTERFIELD, DARIUS E   Fri May 08, 2023  5:46 PM) Daughter states she is taking this medication   furosemide  (LASIX ) 20 MG tablet [id number] No Take 2 tablets (40 mg total) by mouth daily. Lovie Mliss, MD 05/07/2023 Morning Active Child  Melatonin 10 MG TABS [id number] No Take 10 mg by mouth at bedtime. [provider] 05/07/2023 Bedtime Active Child  memantine  (NAMENDA ) 10 MG tablet [id number] No Take 2 tablets (20 mg total) by mouth 2 (two) times daily. Lovie Mliss, MD 05/07/2023 Morning Active Child  metoprolol  succinate (TOPROL -XL) 50 MG 24 hr tablet [id number] No Take 1 tablet (50 mg total) by mouth daily. Take with or immediately following a meal. Lovie Mliss, MD 05/07/2023  8:00 AM Expired 05/08/23 2359 Child           Med Note (SATTERFIELD, DARIUS E   Fri May 08, 2023  5:46 PM) Daughter states patient is taking this medication   Multiple Vitamins-Minerals (MULTIVITAMIN WITH MINERALS) tablet [id number] No Take 1 tablet by mouth daily with breakfast. [provider] 05/07/2023  Morning Active Child  ondansetron  (ZOFRAN -ODT) 4 MG disintegrating tablet [id number] No Take 4 mg by mouth every 8 (eight) hours as needed for vomiting or nausea (dissolve orally). [provider] Past Week Active Child  potassium chloride  (KLOR-CON  M) 10 MEQ tablet [id number] No TAKE 1 TABLET(10 MEQ) BY MOUTH TWICE DAILY Machen, Hiilei, MD 05/07/2023 Morning Active Child   QUEtiapine  (SEROQUEL ) 50 MG tablet [id number] No Take 1 tablet (50 mg total) by mouth 2 (two) times daily. Onuoha, Josephine C, NP 05/07/2023 Morning Expired 05/08/23 2359 Child           Med Note (SATTERFIELD, DARIUS E   Fri May 08, 2023  5:45 PM) Daughter states she is taking             Home Care and Equipment/Supplies: Were Home Health Services Ordered?: NA Any new equipment or medical supplies ordered?: NA  Functional Questionnaire: Do you need assistance with bathing/showering or dressing?: Yes Do you need assistance with meal preparation?: Yes Do you need assistance with eating?: No Do you have difficulty maintaining continence: No Do you need assistance with getting out of bed/getting out of a chair/moving?: Yes Do you have difficulty managing or taking your medications?: Yes  Follow up appointments reviewed: PCP Follow-up appointment confirmed?: Yes Date of PCP follow-up appointment?: 05/21/23 Follow-up Provider: [facility] Follow-up appointment confirmed?: Yes Date of Specialist follow-up appointment?: 05/21/23 Follow-Up Specialty Provider:: cardio Do you need transportation to your follow-up appointment?: No Do you understand care options if your condition(s) worsen?: Yes-patient verbalized understanding    SIGNATURE Julian Lemmings, LPN [facility] Nurse Health Advisor Direct Dial [phone]

## 2023-05-11 NOTE — Telephone Encounter (Signed)
 Call from patient's daughter received a prescription for Eliquis  at discharge on yesterday.   Unable to afford.  Was told that a prescription for the Xarelto  of which she has a coupon could be used. .  Requesting a prescription for the Xarelto  to be sent to the [facility] on [address] and [address].  Has th forms to get in to the Assistance program for the Xarelto  .  Will bring by for doctor signature.

## 2023-05-13 ENCOUNTER — Encounter: Payer: Medicare Other | Admitting: Internal Medicine

## 2023-05-14 ENCOUNTER — Encounter (HOSPITAL_COMMUNITY): Payer: Self-pay

## 2023-05-14 ENCOUNTER — Emergency Department (HOSPITAL_COMMUNITY): Payer: Medicare Other

## 2023-05-14 ENCOUNTER — Observation Stay (HOSPITAL_COMMUNITY)
Admission: EM | Admit: 2023-05-14 | Discharge: 2023-05-25 | Disposition: A | Discharge status: Skilled Nursing Facility | Payer: Medicare Other | Attending: Internal Medicine | Admitting: Internal Medicine

## 2023-05-14 DIAGNOSIS — Z8673 Personal history of transient ischemic attack (TIA), and cerebral infarction without residual deficits: Secondary | ICD-10-CM | POA: Insufficient documentation

## 2023-05-14 DIAGNOSIS — R29818 Other symptoms and signs involving the nervous system: Secondary | ICD-10-CM | POA: Diagnosis not present

## 2023-05-14 DIAGNOSIS — I4891 Unspecified atrial fibrillation: Secondary | ICD-10-CM | POA: Insufficient documentation

## 2023-05-14 DIAGNOSIS — N390 Urinary tract infection, site not specified: Secondary | ICD-10-CM | POA: Diagnosis not present

## 2023-05-14 DIAGNOSIS — Z7901 Long term (current) use of anticoagulants: Secondary | ICD-10-CM | POA: Diagnosis not present

## 2023-05-14 DIAGNOSIS — I6782 Cerebral ischemia: Secondary | ICD-10-CM | POA: Diagnosis not present

## 2023-05-14 DIAGNOSIS — R0600 Dyspnea, unspecified: Secondary | ICD-10-CM | POA: Diagnosis not present

## 2023-05-14 DIAGNOSIS — J449 Chronic obstructive pulmonary disease, unspecified: Secondary | ICD-10-CM | POA: Diagnosis not present

## 2023-05-14 DIAGNOSIS — E871 Hypo-osmolality and hyponatremia: Secondary | ICD-10-CM | POA: Insufficient documentation

## 2023-05-14 DIAGNOSIS — I5042 Chronic combined systolic (congestive) and diastolic (congestive) heart failure: Secondary | ICD-10-CM | POA: Insufficient documentation

## 2023-05-14 DIAGNOSIS — F03918 Unspecified dementia, unspecified severity, with other behavioral disturbance: Secondary | ICD-10-CM | POA: Diagnosis not present

## 2023-05-14 DIAGNOSIS — I110 Hypertensive heart disease with heart failure: Secondary | ICD-10-CM | POA: Diagnosis not present

## 2023-05-14 DIAGNOSIS — I361 Nonrheumatic tricuspid (valve) insufficiency: Secondary | ICD-10-CM | POA: Diagnosis not present

## 2023-05-14 DIAGNOSIS — I10 Essential (primary) hypertension: Secondary | ICD-10-CM | POA: Diagnosis not present

## 2023-05-14 DIAGNOSIS — R4182 Altered mental status, unspecified: Principal | ICD-10-CM

## 2023-05-14 DIAGNOSIS — I340 Nonrheumatic mitral (valve) insufficiency: Secondary | ICD-10-CM | POA: Diagnosis not present

## 2023-05-14 DIAGNOSIS — N179 Acute kidney failure, unspecified: Secondary | ICD-10-CM | POA: Diagnosis not present

## 2023-05-14 DIAGNOSIS — Z96611 Presence of right artificial shoulder joint: Secondary | ICD-10-CM | POA: Diagnosis not present

## 2023-05-14 DIAGNOSIS — Z853 Personal history of malignant neoplasm of breast: Secondary | ICD-10-CM | POA: Insufficient documentation

## 2023-05-14 DIAGNOSIS — F411 Generalized anxiety disorder: Secondary | ICD-10-CM | POA: Diagnosis present

## 2023-05-14 DIAGNOSIS — I672 Cerebral atherosclerosis: Secondary | ICD-10-CM | POA: Diagnosis not present

## 2023-05-14 DIAGNOSIS — Z1152 Encounter for screening for COVID-19: Secondary | ICD-10-CM | POA: Diagnosis not present

## 2023-05-14 DIAGNOSIS — I959 Hypotension, unspecified: Secondary | ICD-10-CM | POA: Diagnosis not present

## 2023-05-14 DIAGNOSIS — R531 Weakness: Secondary | ICD-10-CM | POA: Diagnosis not present

## 2023-05-14 LAB — CBC
HCT: 38.1 % (ref 36.0–46.0)
Hemoglobin: 12.1 g/dL (ref 12.0–15.0)
MCH: 26.9 pg (ref 26.0–34.0)
MCHC: 31.8 g/dL (ref 30.0–36.0)
MCV: 84.9 fL (ref 80.0–100.0)
Platelets: 384 10*3/uL (ref 150–400)
RBC: 4.49 MIL/uL (ref 3.87–5.11)
RDW: 14.3 % (ref 11.5–15.5)
WBC: 14.4 10*3/uL — ABNORMAL HIGH (ref 4.0–10.5)
nRBC: 0 % (ref 0.0–0.2)

## 2023-05-14 LAB — I-STAT CHEM 8, ED
BUN: 24 mg/dL — ABNORMAL HIGH (ref 8–23)
Calcium, Ion: 1.08 mmol/L — ABNORMAL LOW (ref 1.15–1.40)
Chloride: 94 mmol/L — ABNORMAL LOW (ref 98–111)
Creatinine, Ser: 1.1 mg/dL — ABNORMAL HIGH (ref 0.44–1.00)
Glucose, Bld: 111 mg/dL — ABNORMAL HIGH (ref 70–99)
HCT: 40 % (ref 36.0–46.0)
Hemoglobin: 13.6 g/dL (ref 12.0–15.0)
Potassium: 4 mmol/L (ref 3.5–5.1)
Sodium: 131 mmol/L — ABNORMAL LOW (ref 135–145)
TCO2: 25 mmol/L (ref 22–32)

## 2023-05-14 LAB — I-STAT VENOUS BLOOD GAS, ED
Acid-Base Excess: 2 mmol/L (ref 0.0–2.0)
Bicarbonate: 28.3 mmol/L — ABNORMAL HIGH (ref 20.0–28.0)
Calcium, Ion: 1.09 mmol/L — ABNORMAL LOW (ref 1.15–1.40)
HCT: 38 % (ref 36.0–46.0)
Hemoglobin: 12.9 g/dL (ref 12.0–15.0)
O2 Saturation: 37 %
Potassium: 3.8 mmol/L (ref 3.5–5.1)
Sodium: 129 mmol/L — ABNORMAL LOW (ref 135–145)
TCO2: 30 mmol/L (ref 22–32)
pCO2, Ven: 47.7 mm[Hg] (ref 44–60)
pH, Ven: 7.382 (ref 7.25–7.43)
pO2, Ven: 22 mm[Hg] — CL (ref 32–45)

## 2023-05-14 LAB — DIFFERENTIAL
Abs Immature Granulocytes: 0.1 10*3/uL — ABNORMAL HIGH (ref 0.00–0.07)
Basophils Absolute: 0.1 10*3/uL (ref 0.0–0.1)
Basophils Relative: 0 %
Eosinophils Absolute: 0.1 10*3/uL (ref 0.0–0.5)
Eosinophils Relative: 1 %
Immature Granulocytes: 1 %
Lymphocytes Relative: 9 %
Lymphs Abs: 1.2 10*3/uL (ref 0.7–4.0)
Monocytes Absolute: 1.4 10*3/uL — ABNORMAL HIGH (ref 0.1–1.0)
Monocytes Relative: 10 %
Neutro Abs: 11.5 10*3/uL — ABNORMAL HIGH (ref 1.7–7.7)
Neutrophils Relative %: 79 %

## 2023-05-14 LAB — COMPREHENSIVE METABOLIC PANEL
ALT: 20 U/L (ref 0–44)
AST: 17 U/L (ref 15–41)
Albumin: 3.3 g/dL — ABNORMAL LOW (ref 3.5–5.0)
Alkaline Phosphatase: 89 U/L (ref 38–126)
Anion gap: 11 (ref 5–15)
BUN: 22 mg/dL (ref 8–23)
CO2: 24 mmol/L (ref 22–32)
Calcium: 8.6 mg/dL — ABNORMAL LOW (ref 8.9–10.3)
Chloride: 95 mmol/L — ABNORMAL LOW (ref 98–111)
Creatinine, Ser: 1.08 mg/dL — ABNORMAL HIGH (ref 0.44–1.00)
GFR, Estimated: 54 mL/min — ABNORMAL LOW (ref >60)
Glucose, Bld: 116 mg/dL — ABNORMAL HIGH (ref 70–99)
Potassium: 4 mmol/L (ref 3.5–5.1)
Sodium: 130 mmol/L — ABNORMAL LOW (ref 135–145)
Total Bilirubin: 0.6 mg/dL (ref 0.0–1.2)
Total Protein: 6.2 g/dL — ABNORMAL LOW (ref 6.5–8.1)

## 2023-05-14 LAB — CBG MONITORING, ED: Glucose-Capillary: 127 mg/dL — ABNORMAL HIGH (ref 70–99)

## 2023-05-14 LAB — ETHANOL: Alcohol, Ethyl (B): <10 mg/dL (ref <10)

## 2023-05-14 MED ORDER — METOPROLOL TARTRATE 5 MG/5ML IV SOLN: 2.5000 mg | INTRAVENOUS | Status: DC

## 2023-05-14 MED ORDER — METOPROLOL TARTRATE 25 MG PO TABS
25.0000 mg | ORAL_TABLET | Freq: Once | ORAL | Status: AC
  Administered 2023-05-14: 25 mg via ORAL
  Filled 2023-05-14: qty 1

## 2023-05-14 NOTE — ED Triage Notes (Signed)
Daughter reports that she is walking and shuffling her feet slower than normal, She was recently here on the 8th for resp distress she awoke from. She currently is exhibiting no other symptoms other than the daughters report of a slower gait. She does have dementia at baseline and is currently at her baseline.   Medic vitals   102/60 74hr Hx of afib 98%ra 161bgl 18rr

## 2023-05-14 NOTE — ED Provider Triage Note (Signed)
Emergency Medicine Provider Triage Evaluation Note  Marisa Nunez , a 78 y.o. female  was evaluated in triage.  Pt w dementia-fam called EMS for ataxia. Unknown LKW. Family unable to provide reasonable hx.  Review of Systems  Positive: Ataxia  Negative: fever  Physical Exam  LMP  (LMP Unknown)  Gen:   Awake, no distress   Resp:  Normal effort  MSK:   Moves extremities without difficulty  Other:  Ataxic gait, no drift, no weakness or facial asymetry  Medical Decision Making  Medically screening exam initiated at 8:09 PM.  Appropriate orders placed.  ANOUK BILBERRY was informed that the remainder of the evaluation will be completed by another provider, this initial triage assessment does not replace that evaluation, and the importance of remaining in the ED until their evaluation is complete.     Arthor Captain, PA-C 05/14/23 2154

## 2023-05-14 NOTE — ED Provider Notes (Signed)
[place] EMERGENCY DEPARTMENT AT [facility] Provider Note  MDM   HPI/ROS:  Marisa Nunez is a 78 y.o. female with pertinent past medical history of dementia, HFrEF (EF 45-50% 03/08/24) with new diagnosis of severe tricuspid regurgitation, HTN, HLD, AF on Xarelto who presents with altered mental status.  Patient reports 1 day history of increased dyspnea from baseline, though she denies any fevers, chills, cough, congestion, chest pain, leg swelling.  She reports compliance with her inhalers and diuretics cannot name her medications.  On further discussion with patient's daughter [name] [phone]), she reports that patient was " acting like she was drunk" today, stating that patient informed her she needed to get her walker when she does not usually ambulate with a walker, told her she was paralyzed, 3 when I get the wall and was walking with a shuffled gait which is not her baseline.  She additionally reports that patient was slightly slurring her speech and reporting some back pain.  Per chart review, patient was admitted 1/10 - 05/10/2023 for heart failure exacerbation.  She was found during this admission to have new severe tricuspid regurgitation as well as progressive mitral valve disease.  Physical exam is notable for: - Intermittent pursed lip breathing though speaking in complete sentences -- Regular rate, irregularly irregular rhythm -- Lungs CTAB -- No lower extremity edema -- A&Ox2  On my initial evaluation, patient is:  -- Vital signs stable. Patient afebrile, hemodynamically stable, and non-toxic appearing. -- Additional history obtained from patient's daughter as above   --  -- Labs with creatinine 1.08, BUN 22, sodium 130, albumin 3.3, WBC 14.4  Based on this patient's current presentation, including their history and physical exam, initial differentials include infection (UTI, pneumonia, less likely meningitis or encephalitis), CNS process  (ICH, stroke), metabolic encephalopathy, hypercarbia, unlikely thyroid storm, ACS.  Initial EKG notable for A-fib RVR rate 124.  Low suspicion for heart failure exacerbation as patient appears grossly euvolemic on exam with clear lung sounds.  She has intermittently pursed lip respirations however overall minimal signs of respiratory distress.  Initial labs obtained as part of triage process with mildly elevated creatinine from prior at 1.08 (increased from 0.82 at time of discharge) that without significant elevation of BUN, mild hyponatremia at 130, leukocytosis of 14.4.  Ethanol level was negative.  I reviewed CT head which did not demonstrate any obvious signs of intracranial hemorrhage.  Chest x-ray without evidence of pneumonia or pulmonary edema.  Plan to additionally obtain UA, VBG to evaluate for other causes of altered mental status, though patient currently appears to be grossly at her baseline and is answering questions appropriately, able to ambulate without difficulty.  She does have some mild metabolic derangements as above but do not feel significant objective cause reported altered mental status earlier this evening.  Following my initial evaluation patient was noted to go back into A-fib RVR though otherwise hemodynamically stable and asymptomatic.  Per discussion with patient's daughter she has received all of her medications as scheduled today.  Plan to give additional dose of p.o. metoprolol.  Patient handed off to oncoming provider pending results of remainder of workup.  Please see their note for further treatment plan details.  Disposition:   Clinical Impression:  1. Altered mental status, unspecified altered mental status type     Rx / DC Orders ED Discharge Orders     None       The plan for this patient was discussed with Dr. Eloise Harman,  who voiced agreement and who oversaw evaluation and treatment of this patient.   Clinical Complexity A medically appropriate history,  review of systems, and physical exam was performed.  My independent interpretations of EKG, labs, and radiology are documented in the ED course above.   If decision rules were used in this patient's evaluation, they are listed below.   Patient's presentation is most consistent with acute presentation with potential threat to life or bodily function.  Medical Decision Making Amount and/or Complexity of Data Reviewed Labs: ordered. Radiology: ordered.  Risk Prescription drug management.    HPI/ROS      See MDM section for pertinent HPI and ROS. A complete ROS was performed with pertinent positives/negatives noted above.   Past Medical History:  Diagnosis Date   Anxiety    Breast cancer (HCC)    Chronic combined systolic and diastolic CHF (congestive heart failure) (HCC)    a. Echo 11/2021: LVEF of 40-45% with global hypokinesis, mildly reduced RV systolic function, and moderate MR   COPD (chronic obstructive pulmonary disease) (HCC)    Dementia (HCC)    Depression    History of TIA 10/30/2020   History of tobacco use 05/06/2017   Formatting of this note might be different from the original.  Greater than 40 pack year history. Quit in 2015.     HTN (hypertension)    Paroxysmal atrial fibrillation (HCC) 2017   Stroke [facility])     Past Surgical History:  Procedure Laterality Date   ABDOMINAL HYSTERECTOMY     CARDIOVERSION N/A 02/19/2022   Procedure: CARDIOVERSION;  Surgeon: Meriam Sprague, MD;  Location: [facility] ENDOSCOPY;  Service: Cardiovascular;  Laterality: N/A;   REVERSE SHOULDER ARTHROPLASTY Right 10/21/2021   Procedure: RIGHT REVERSE TOTAL SHOULDER ARTHROPLASTY;  Surgeon: Bjorn Pippin, MD;  Location: [facility] OR;  Service: Orthopedics;  Laterality: Right;  allen bed, spider, tornier (rep aware), open shoulder tray.      Physical Exam   Vitals:   05/14/23 2009  BP: (!) 113/108  Pulse: 90  Resp: 18  Temp: 98.4 F (36.9 C)  TempSrc: Oral  SpO2: 98%    Physical  Exam Gen: NAD. Appears comfortable.  Frail, elderly HENT: Conjunctiva clear, PERRL, EOMI. MMM.  CV: Regular rate, irregularly irregular rhythm. No M/R/G Pulm: Lungs CTAB with no wheezing, rales, or rhonchi.  Intermittent pursed lip respirations GI: Abdomen soft, non-tender, non-distended. Normal bowel sounds in all 4 quadrants. MSK/Skin: No lower extremity edema. Extremities warm, well-perfused with 2+ pulses in all 4 extremities. Neuro: A&Ox2. GCS 14. Moves all extremities.  PERRL. EOMI. Sensation intact to light touch across V1-V3. Full and symmetric facial movements. Hearing grossly intact. Uvula midline with symmetric palatal rise. Full strength of SCM and trapezius muscles bilaterally. Tongue protrudes midline with no fasciculations or atrophy. 5/5 strength in bilateral upper and lower extremities with sensation intact to light touch throughout. Normal finger-to-nose. Normal gait.      Procedures   If procedures were preformed on this patient, they are listed below:  Procedures   Mikeal Hawthorne, MD Emergency Medicine PGY-2   Please note that this documentation was produced with the assistance of voice-to-text technology and may contain errors.    Mikeal Hawthorne, MD 05/14/23 2344    Rondel Baton, MD 05/15/23 0900

## 2023-05-15 ENCOUNTER — Other Ambulatory Visit: Payer: Self-pay

## 2023-05-15 DIAGNOSIS — Z7901 Long term (current) use of anticoagulants: Secondary | ICD-10-CM | POA: Diagnosis not present

## 2023-05-15 DIAGNOSIS — I110 Hypertensive heart disease with heart failure: Secondary | ICD-10-CM | POA: Diagnosis not present

## 2023-05-15 DIAGNOSIS — Z853 Personal history of malignant neoplasm of breast: Secondary | ICD-10-CM | POA: Diagnosis not present

## 2023-05-15 DIAGNOSIS — J449 Chronic obstructive pulmonary disease, unspecified: Secondary | ICD-10-CM | POA: Diagnosis not present

## 2023-05-15 DIAGNOSIS — N390 Urinary tract infection, site not specified: Secondary | ICD-10-CM | POA: Diagnosis not present

## 2023-05-15 DIAGNOSIS — R4182 Altered mental status, unspecified: Secondary | ICD-10-CM

## 2023-05-15 DIAGNOSIS — E871 Hypo-osmolality and hyponatremia: Secondary | ICD-10-CM | POA: Diagnosis not present

## 2023-05-15 DIAGNOSIS — N3000 Acute cystitis without hematuria: Secondary | ICD-10-CM | POA: Diagnosis not present

## 2023-05-15 DIAGNOSIS — I340 Nonrheumatic mitral (valve) insufficiency: Secondary | ICD-10-CM | POA: Diagnosis not present

## 2023-05-15 DIAGNOSIS — I5042 Chronic combined systolic (congestive) and diastolic (congestive) heart failure: Secondary | ICD-10-CM | POA: Diagnosis not present

## 2023-05-15 DIAGNOSIS — I4891 Unspecified atrial fibrillation: Secondary | ICD-10-CM | POA: Diagnosis not present

## 2023-05-15 DIAGNOSIS — I361 Nonrheumatic tricuspid (valve) insufficiency: Secondary | ICD-10-CM | POA: Diagnosis not present

## 2023-05-15 DIAGNOSIS — Z1152 Encounter for screening for COVID-19: Secondary | ICD-10-CM | POA: Diagnosis not present

## 2023-05-15 DIAGNOSIS — Z8673 Personal history of transient ischemic attack (TIA), and cerebral infarction without residual deficits: Secondary | ICD-10-CM | POA: Diagnosis not present

## 2023-05-15 DIAGNOSIS — N179 Acute kidney failure, unspecified: Secondary | ICD-10-CM | POA: Diagnosis not present

## 2023-05-15 LAB — RAPID URINE DRUG SCREEN, HOSP PERFORMED
Amphetamines: NOT DETECTED
Barbiturates: NOT DETECTED
Benzodiazepines: NOT DETECTED
Cocaine: NOT DETECTED
Opiates: NOT DETECTED
Tetrahydrocannabinol: NOT DETECTED

## 2023-05-15 LAB — URINALYSIS, ROUTINE W REFLEX MICROSCOPIC
Bilirubin Urine: NEGATIVE
Glucose, UA: NEGATIVE mg/dL
Hgb urine dipstick: NEGATIVE
Ketones, ur: NEGATIVE mg/dL
Nitrite: NEGATIVE
Protein, ur: NEGATIVE mg/dL
Specific Gravity, Urine: 1.01 (ref 1.005–1.030)
WBC, UA: >50 WBC/hpf (ref 0–5)
pH: 6 (ref 5.0–8.0)

## 2023-05-15 LAB — RESPIRATORY PANEL BY PCR

## 2023-05-15 LAB — SARS CORONAVIRUS 2 BY RT PCR: SARS Coronavirus 2 by RT PCR: NEGATIVE

## 2023-05-15 MED ORDER — QUETIAPINE FUMARATE 25 MG PO TABS
50.0000 mg | ORAL_TABLET | Freq: Once | ORAL | Status: AC
  Administered 2023-05-15: 50 mg via ORAL
  Filled 2023-05-15: qty 2

## 2023-05-15 MED ORDER — MELATONIN 5 MG PO TABS
10.0000 mg | ORAL_TABLET | Freq: Every day | ORAL | Status: DC
Start: 2023-05-15 — End: 2023-05-15

## 2023-05-15 MED ORDER — ONDANSETRON HCL 4 MG/2ML IJ SOLN
4.0000 mg | Freq: Three times a day (TID) | INTRAMUSCULAR | Status: DC | PRN
  Administered 2023-05-15 – 2023-05-20 (×3): 4 mg via INTRAVENOUS
  Filled 2023-05-15 (×3): qty 2

## 2023-05-15 MED ORDER — BENZONATATE 100 MG PO CAPS: 100.0000 mg | ORAL_CAPSULE | Freq: Three times a day (TID) | ORAL | Status: DC

## 2023-05-15 MED ORDER — QUETIAPINE FUMARATE 25 MG PO TABS: 50.0000 mg | ORAL_TABLET | Freq: Two times a day (BID) | ORAL | Status: DC

## 2023-05-15 MED ORDER — POTASSIUM CHLORIDE CRYS ER 10 MEQ PO TBCR
10.0000 meq | EXTENDED_RELEASE_TABLET | Freq: Two times a day (BID) | ORAL | Status: DC
  Administered 2023-05-15 – 2023-05-20 (×11): 10 meq via ORAL
  Filled 2023-05-15 (×11): qty 1

## 2023-05-15 MED ORDER — DIVALPROEX SODIUM 125 MG PO CSDR
250.0000 mg | DELAYED_RELEASE_CAPSULE | Freq: Two times a day (BID) | ORAL | Status: DC
  Administered 2023-05-15 – 2023-05-25 (×20): 250 mg via ORAL
  Filled 2023-05-15 (×21): qty 2

## 2023-05-15 MED ORDER — ADULT MULTIVITAMIN W/MINERALS CH
1.0000 | ORAL_TABLET | Freq: Every day | ORAL | Status: DC
  Administered 2023-05-16 – 2023-05-25 (×10): 1 via ORAL
  Filled 2023-05-15 (×10): qty 1

## 2023-05-15 MED ORDER — CEFTRIAXONE SODIUM 1 G IJ SOLR
1.0000 g | Freq: Once | INTRAMUSCULAR | Status: AC
  Administered 2023-05-15: 1 g via INTRAVENOUS
  Filled 2023-05-15: qty 10

## 2023-05-15 MED ORDER — SODIUM CHLORIDE 0.9 % IV SOLN: INTRAVENOUS | Status: AC

## 2023-05-15 MED ORDER — CITALOPRAM HYDROBROMIDE 20 MG PO TABS
20.0000 mg | ORAL_TABLET | Freq: Two times a day (BID) | ORAL | Status: DC
  Administered 2023-05-15 – 2023-05-18 (×7): 20 mg via ORAL
  Filled 2023-05-15 (×2): qty 1
  Filled 2023-05-15: qty 2
  Filled 2023-05-15 (×4): qty 1

## 2023-05-15 MED ORDER — ALBUTEROL SULFATE (2.5 MG/3ML) 0.083% IN NEBU
2.5000 mg | INHALATION_SOLUTION | RESPIRATORY_TRACT | Status: DC | PRN
  Administered 2023-05-16 – 2023-05-17 (×4): 2.5 mg via RESPIRATORY_TRACT
  Filled 2023-05-15 (×5): qty 3

## 2023-05-15 MED ORDER — RIVAROXABAN 15 MG PO TABS
15.0000 mg | ORAL_TABLET | Freq: Every day | ORAL | Status: DC
  Administered 2023-05-15 – 2023-05-18 (×4): 15 mg via ORAL
  Filled 2023-05-15 (×5): qty 1

## 2023-05-15 MED ORDER — SERTRALINE HCL 25 MG PO TABS
50.0000 mg | ORAL_TABLET | Freq: Every day | ORAL | Status: DC
  Administered 2023-05-15 – 2023-05-16 (×2): 50 mg via ORAL
  Filled 2023-05-15: qty 1
  Filled 2023-05-15: qty 2

## 2023-05-15 MED ORDER — ACETAMINOPHEN 650 MG RE SUPP: 650.0000 mg | Freq: Four times a day (QID) | RECTAL | Status: DC | PRN

## 2023-05-15 MED ORDER — METOPROLOL SUCCINATE ER 50 MG PO TB24
50.0000 mg | ORAL_TABLET | Freq: Every day | ORAL | Status: DC
  Administered 2023-05-15 – 2023-05-25 (×11): 50 mg via ORAL
  Filled 2023-05-15 (×8): qty 1
  Filled 2023-05-15: qty 2
  Filled 2023-05-15 (×2): qty 1

## 2023-05-15 MED ORDER — BENZONATATE 100 MG PO CAPS
100.0000 mg | ORAL_CAPSULE | Freq: Three times a day (TID) | ORAL | Status: DC | PRN
  Administered 2023-05-15 – 2023-05-17 (×5): 100 mg via ORAL
  Filled 2023-05-15 (×6): qty 1

## 2023-05-15 MED ORDER — MEMANTINE HCL 10 MG PO TABS
20.0000 mg | ORAL_TABLET | Freq: Two times a day (BID) | ORAL | Status: DC
  Administered 2023-05-15 – 2023-05-24 (×17): 20 mg via ORAL
  Filled 2023-05-15 (×18): qty 2

## 2023-05-15 MED ORDER — CEPHALEXIN 500 MG PO CAPS: 500.0000 mg | ORAL_CAPSULE | Freq: Four times a day (QID) | ORAL | 0 refills | Status: DC

## 2023-05-15 MED ORDER — ACETAMINOPHEN 325 MG PO TABS
650.0000 mg | ORAL_TABLET | Freq: Once | ORAL | Status: AC
  Administered 2023-05-15: 650 mg via ORAL
  Filled 2023-05-15: qty 2

## 2023-05-15 MED ORDER — MELATONIN 5 MG PO TABS
10.0000 mg | ORAL_TABLET | Freq: Every day | ORAL | Status: DC
  Administered 2023-05-15 – 2023-05-24 (×10): 10 mg via ORAL
  Filled 2023-05-15 (×10): qty 2

## 2023-05-15 MED ORDER — MEMANTINE HCL 10 MG PO TABS: 20.0000 mg | ORAL_TABLET | Freq: Two times a day (BID) | ORAL | Status: DC

## 2023-05-15 MED ORDER — ACETAMINOPHEN 325 MG PO TABS
650.0000 mg | ORAL_TABLET | Freq: Four times a day (QID) | ORAL | Status: DC | PRN
  Administered 2023-05-16 – 2023-05-20 (×3): 650 mg via ORAL
  Filled 2023-05-15 (×4): qty 2

## 2023-05-15 MED ORDER — QUETIAPINE FUMARATE 25 MG PO TABS
50.0000 mg | ORAL_TABLET | Freq: Two times a day (BID) | ORAL | Status: DC
  Administered 2023-05-15 – 2023-05-18 (×6): 50 mg via ORAL
  Filled 2023-05-15 (×6): qty 2

## 2023-05-15 MED ORDER — NITROFURANTOIN MONOHYD MACRO 100 MG PO CAPS
100.0000 mg | ORAL_CAPSULE | Freq: Two times a day (BID) | ORAL | Status: AC
  Administered 2023-05-16 – 2023-05-19 (×8): 100 mg via ORAL
  Filled 2023-05-15 (×8): qty 1

## 2023-05-15 NOTE — ED Notes (Signed)
Pt continues to remove vs monitoring. Pt not redirectable

## 2023-05-15 NOTE — ED Notes (Signed)
Pt's daughter arrived and was verbally aggressive w/ staff and refusing to take pt home. Notified Messick MD to come talk to pt's daughter. RN and MD arrived back to room and daughter was gone.

## 2023-05-15 NOTE — ED Notes (Signed)
IV attempt x2.

## 2023-05-15 NOTE — Progress Notes (Signed)
CSW spoke with patient's daughter who states she is almost here to pick patient up.  CSW informed RN of information.  Edwin Dada, MSW, LCSW Transitions of Care  Clinical Social Worker II [phone]

## 2023-05-15 NOTE — ED Notes (Signed)
Called 5C and notified of pt being transported to [facility]

## 2023-05-15 NOTE — H&P (Addendum)
Date: 05/15/2023               Patient Name:  Marisa Nunez MRN: [id number]  DOB: [birth year] Age / Sex: 78 y.o., female   PCP: Mercie Eon, MD         Medical Service: Internal Medicine Teaching Service         Attending Physician: Dr. Mercie Eon, MD    First Contact: Dr. Monna Fam Pager: [phone]  Second Contact: Dr. Rana Snare Pager: [phone]       After Hours (After 5p/  First Contact Pager: [phone]  weekends / holidays): Second Contact Pager: [phone]   Chief Complaint: bizarre behavior  History of Present Illness:   Marisa Nunez is a 78 y.o. F with PMH of dementia with behavioral disturbances, atrial fibrillation on xarelto, HFrEF (LV EF 45-50%) who presented to [facility] ED due to disorientation and bizarre behavior at home. Her family is concerned because she has not been acting like herself since she was discharged from the hospital, has been sleeping more, and not making sense when she speaks sometimes. She is normally more interactive but since discharge has had periods where she is more lethargic between episodes of baseline personality.  In the ED she was noted to be in atrial fibrillation with RVR, UA indicative of infection, leukocytosis of 14.4. She has a mild hyponatremia of 130, borderline AKI with serum creatinine of 1.08 (baseline 0.6-0.8)  She was given a dose of ceftriaxone for UTI and a culture was sent. Her daughter was contacted to pick her up as she was deemed appropriate for discharge however when she arrived, she firmly disagreed that discharge was appropriate and refused to take the patient home. IMTS was paged for admission.  First contact: Marisa Nunez, daughter, [phone] Second contact: Marisa Nunez, son in law, [phone]  Past Medical History: Past Medical History:  Diagnosis Date   Anxiety    Breast cancer (HCC)    Chronic combined systolic and diastolic CHF (congestive heart failure) (HCC)    a. Echo 11/2021: LVEF of 40-45% with global  hypokinesis, mildly reduced RV systolic function, and moderate MR   COPD (chronic obstructive pulmonary disease) (HCC)    Dementia (HCC)    Depression    History of TIA 10/30/2020   History of tobacco use 05/06/2017   Formatting of this note might be different from the original.  Greater than 40 pack year history. Quit in 2015.     HTN (hypertension)    Paroxysmal atrial fibrillation (HCC) 2017   Stroke [facility])    Past Surgical History: Past Surgical History:  Procedure Laterality Date   ABDOMINAL HYSTERECTOMY     CARDIOVERSION N/A 02/19/2022   Procedure: CARDIOVERSION;  Surgeon: Meriam Sprague, MD;  Location: [facility] ENDOSCOPY;  Service: Cardiovascular;  Laterality: N/A;   REVERSE SHOULDER ARTHROPLASTY Right 10/21/2021   Procedure: RIGHT REVERSE TOTAL SHOULDER ARTHROPLASTY;  Surgeon: Bjorn Pippin, MD;  Location: [facility] OR;  Service: Orthopedics;  Laterality: Right;  allen bed, spider, tornier (rep aware), open shoulder tray.   Meds:  No current facility-administered medications on file prior to encounter.   Current Outpatient Medications on File Prior to Encounter  Medication Sig Dispense Refill   acetaminophen (TYLENOL) 500 MG tablet Take 1 tablet (500 mg total) by mouth every 6 (six) hours as needed for moderate pain, fever or mild pain. 30 tablet 0   albuterol (PROVENTIL) (2.5 MG/3ML) 0.083% nebulizer solution Take 3 mLs (2.5 mg total) by nebulization  every 4 (four) hours as needed for shortness of breath or wheezing. 75 mL 12   albuterol (VENTOLIN HFA) 108 (90 Base) MCG/ACT inhaler Inhale 1-2 puffs into the lungs every 6 (six) hours as needed. 1 each 0   benzonatate (TESSALON) 100 MG capsule Take 1 capsule (100 mg total) by mouth every 8 (eight) hours. (Patient taking differently: Take 100 mg by mouth 2 (two) times daily as needed for cough.) 21 capsule 0   citalopram (CELEXA) 20 MG tablet Take 1 tablet (20 mg total) by mouth in the morning and at bedtime. 180 tablet 0   divalproex  (DEPAKOTE SPRINKLE) 125 MG capsule Take 2 capsules (250 mg total) by mouth 2 (two) times daily. 120 capsule 0   furosemide (LASIX) 20 MG tablet Take 2 tablets (40 mg total) by mouth daily. 90 tablet 3   Melatonin 10 MG TABS Take 10 mg by mouth at bedtime.     memantine (NAMENDA) 10 MG tablet Take 2 tablets (20 mg total) by mouth 2 (two) times daily.     metoprolol succinate (TOPROL-XL) 50 MG 24 hr tablet Take 1 tablet (50 mg total) by mouth daily. Take with or immediately following a meal. 60 tablet 0   Multiple Vitamins-Minerals (MULTIVITAMIN WITH MINERALS) tablet Take 1 tablet by mouth daily with breakfast.     ondansetron (ZOFRAN-ODT) 4 MG disintegrating tablet Take 4 mg by mouth every 8 (eight) hours as needed for vomiting or nausea (dissolve orally).     potassium chloride (KLOR-CON M) 10 MEQ tablet TAKE 1 TABLET(10 MEQ) BY MOUTH TWICE DAILY 180 tablet 1   QUEtiapine (SEROQUEL) 50 MG tablet Take 1 tablet (50 mg total) by mouth 2 (two) times daily. 60 tablet 0   rivaroxaban (XARELTO) 20 MG TABS tablet Take 1 tablet (20 mg total) by mouth daily with supper. Do not take with Eliquis 30 tablet 2   sertraline (ZOLOFT) 50 MG tablet Take 50 mg by mouth daily.     Allergies: Allergies as of 05/14/2023 - Review Complete 05/14/2023  Allergen Reaction Noted   Influenza virus vaccine Hives 02/03/2017   Myrbetriq [mirabegron er] Other (See Comments) 06/13/2021   Pneumococcal vaccine Hives 02/03/2017   Ativan [lorazepam] Other (See Comments) 10/17/2021   Family History:  Family History  Problem Relation Age of Onset   Arrhythmia Father    Social History: Patient lives with her daughter, son in Social worker, and grandson. She is dependent for ADLs and IADLs though she does try to maintain some independence with bathing. She does not drink alcohol, use tobacco, or other drugs. PCP is Dr. Lafonda Mosses at the [facility].  Review of Systems: A complete ROS was negative except as per HPI.   Physical Exam: Blood pressure  105/79, pulse 78, temperature (!) 97 F (36.1 C), temperature source Axillary, resp. rate 16, SpO2 97%. Physical Exam Constitutional:      General: She is not in acute distress.    Appearance: She is not ill-appearing.     Comments: Chronically ill appearing  Cardiovascular:     Rate and Rhythm: Normal rate. Rhythm irregular.  Pulmonary:     Effort: Pulmonary effort is normal.     Breath sounds: Normal breath sounds.     Comments: Cough present. Abdominal:     General: Abdomen is flat.     Palpations: Abdomen is soft.     Tenderness: There is no abdominal tenderness. There is no right CVA tenderness, left CVA tenderness or guarding.  Musculoskeletal:  Right lower leg: No edema.     Left lower leg: No edema.  Skin:    General: Skin is warm and dry.  Neurological:     Mental Status: She is alert.     Comments: Wakes to voice, oriented to self and place but not time.    EKG: Atrial fibrillation with RVR, L axis deviation.  CXR: No acute cardiopulmonary abnormalities.  CT Head: 1. No acute intracranial abnormality. 2. Stable atrophy and chronic small vessel ischemia.  Assessment & Plan by Problem: Principal Problem:   UTI (urinary tract infection)  Urinary tract infection S/p ceftriaxone per EDP. Urine culture is pending. Plan: -Transition to macrobid 100 BID for 4 additional days -F/u blood culture -F/u urine culture, narrow antibiotics as indicated  Hyponatremia Suspect hypotonic hyponatremia.  Plan: -Will check urine sodium, urine osmolality -NS 75 cc/h x 10 h  AKI Borderline AKI in this patient with baseline serum creatinine ranging from 0.6-0.8s. On exam she does appear mildly hypovolemic. Plan: -Gentle fluid resuscitation as above -Trend BMP -Will hold lasix for today, plan to resume tomorrow if improvement of volume status and renal function  Atrial fibrillation Currently rate controlled. OP regimen is metoprolol 50 mg daily and xarelto 20 mg  daily. Plan: -Continue home metoprolol and xarelto  Cough Acute, family who lives with her has had some cough as well. CXR negative for acute processes. She does not appear volume overloaded on my exam and lung sounds are clear. She has not had a measured fever but does have a leukocytosis which could be related in part to a viral URI and known UTI. Plan: -Cough suppression with benzonatate 100 mg TID PRN cough -Will check RVP, COVID-19  Severe tricuspid regurgitation Moderate to severe mitral valve regurgitation Noted during recent hospitalization. Plan established with cardiology prior to discharge for no further inpatient work-up or treatment, and she has OP follow-up scheduled for 01/23. Plan: -Continue plan for OP follow up  HFrEF (LV EF 45-50%) HTN Clinically mildly hypovolemic. Normotensive here. OP regimen is furosemide 40 mg daily, metoprolol 50 mg daily. Plan: -Will hold lasix for today, plan to resume tomorrow if improvement of volume status and renal function -Continue home metoprolol  Dementia with behavioral disturbances OP regimen is citalopram 20 mg BID, depakote 250 mg BID, memantine 20 mg BID, quetiapine 50 mg BID, sertraline 50 mg daily, melatonin daily at bedtime. She was diagnosed in 2020 but had signs of dementia prior to that. Her daughter Marisa Nunez explains that she has had noticeable progression in her disease over the last year. I did counsel her and her son that she may require longer than expected to return to a relative baseline after each hospitalization, and that she may never fully reach her prior baseline again. We discussed the progressive nature of her disease and that her behavioral changes may have some component related to that. Plan: -Continue home citalopram, depakote, memantine, quetiapine, sertraline, melatonin  Dispo: Admit patient to Observation with expected length of stay less than 2 midnights.  SignedChamp Mungo, DO 05/15/2023, 10:23 AM   After 5pm on weekdays and 1pm on weekends: On Call pager: [phone]

## 2023-05-15 NOTE — ED Notes (Signed)
Messick MD at bedside. Family at bedside

## 2023-05-15 NOTE — ED Notes (Signed)
Family at bedside awaiting hospitalist. Calm, cooperative.

## 2023-05-15 NOTE — Discharge Instructions (Addendum)
Ms Febe, Champa were hospitalized for a UTI. We have treated you with antibiotics, and are doing much better now. I feel comfortable discharging you home with follow up with your primary care doctor. Thank you for allowing [place] to be part of your care.   We arranged for you to follow up at: [facility] on 06/03/23 at 8:45 am  Please note these changes made to your medications:   *Please START taking:  Buspar 5mg  twice daily Citalopram (Celexa) 20mg  once daily Memantine 10mg  twice daily Seroquel 50mg  in the morning and 75mg  (1.5 tablets) in the evening  *Please STOP taking:  Furosemide until restarted by your PCP Sertraline  Please call our clinic if you have any questions or concerns, we may be able to help and keep you from a long and expensive emergency room wait. Our clinic and after hours phone number is [phone], the best time to call is Monday through Friday 9 am to 4 pm but there is always someone available 24/7 if you have an emergency. If you need medication refills please notify your pharmacy one week in advance and they will send [place] a request.

## 2023-05-15 NOTE — ED Provider Notes (Signed)
  Physical Exam  BP 126/74 (BP Location: Right Arm)   Pulse (!) 123   Temp 98.4 F (36.9 C) (Oral)   Resp 20   LMP  (LMP Unknown)   SpO2 100%   Physical Exam  Procedures  Procedures  ED Course / MDM    Medical Decision Making Amount and/or Complexity of Data Reviewed Labs: ordered. Radiology: ordered.  Risk OTC drugs. Prescription drug management.   Patient care assumed at shift handoff from previous provider. See her note for full details. In short, 78 y/o female pt with history of advanced dementia, AF on Xarelto, presented with AMS.   VBG grossly unremarkable. UA concerning for possible infection. Will order keflex for treatment and send urine for culture. Heart rate at time of my assessment 108. Patient began to become mildly agitated and complained of back pain. Seroquel (home dose) and Tylenol ordered. Patient stable for discharge at this time. RN contacting patient's daughter for transport.      Pamala Duffel 05/15/23 0049    Rondel Baton, MD 05/15/23 0900

## 2023-05-15 NOTE — ED Provider Notes (Signed)
I was asked by nursing staff to assess patient.  [name] arrived this morning for pickup and transport back to home.  [name] does not feel that the patient is appropriate for discharge.  Internal medicine teaching service contacted regarding evaluation for admission.   Wynetta Fines, MD 05/15/23 [phone]

## 2023-05-15 NOTE — Progress Notes (Addendum)
Patient anxious, wandering in hallway, would not stay in bed.  Cannot remember conversations with nurse, asking same questions over and over, confused, oriented to name only,  moved patient in chair to nurses station, called patient's daughter who spoke with patient and calmed her down.  Secure chat MD with update and request to give nighttime meds early.  Daughter stated patient takes nighttime meds at 7pm.  Meds are scheduled for 2000 and 2200.   OK to give nighttime meds early as per transcribed order from Dr. Sherrilee Gilles.

## 2023-05-15 NOTE — Hospital Course (Addendum)
UTI Patient presented to the ED with disorientation and bizarre behavior at home, found to have a UTI. In the ED she was noted to be in atrial fibrillation with RVR, leukocytosis of 14.4. She was started on ceftriaxone, then transitioned to cefadroxil to complete a 4 day course. Patient remained afebrile, and her mental status improved with resolution of her UTI.   Dementia Family notes noticeable progression of patient's dementia over the last year. Extensive home regimen was continued during admission with increase in seroquel and avoidance of anticholinergic agents, with stabilization of patient's dementia with behavioral disturbances.   Hyponatremia Na initially 130, which improved with po intake. Asymptomatic during admission  AKI Cr at admission 1.08 from baseline 0.6-0.8. Mild AKI resolved with po intake.   Atrial fibrillation Heart rate remained rate controlled during admission after initial stabilization. Home beta blocker continued during admission.  Severe tricuspid regurgitation Moderate to severe mitral valve regurgitation Noted during recent hospitalization. Plan established with cardiology for outpatient follow up.   HFrEF Patient remained euvolemic on exam. Diuresis held, beta blocker continued.

## 2023-05-16 DIAGNOSIS — N3000 Acute cystitis without hematuria: Secondary | ICD-10-CM | POA: Diagnosis not present

## 2023-05-16 DIAGNOSIS — F03918 Unspecified dementia, unspecified severity, with other behavioral disturbance: Secondary | ICD-10-CM | POA: Diagnosis not present

## 2023-05-16 DIAGNOSIS — R410 Disorientation, unspecified: Secondary | ICD-10-CM | POA: Diagnosis not present

## 2023-05-16 LAB — URINE CULTURE

## 2023-05-16 LAB — BASIC METABOLIC PANEL
Anion gap: 8 (ref 5–15)
BUN: 17 mg/dL (ref 8–23)
CO2: 27 mmol/L (ref 22–32)
Calcium: 8.5 mg/dL — ABNORMAL LOW (ref 8.9–10.3)
Chloride: 98 mmol/L (ref 98–111)
Creatinine, Ser: 0.94 mg/dL (ref 0.44–1.00)
GFR, Estimated: >60 mL/min (ref >60)
Glucose, Bld: 101 mg/dL — ABNORMAL HIGH (ref 70–99)
Potassium: 4.5 mmol/L (ref 3.5–5.1)
Sodium: 133 mmol/L — ABNORMAL LOW (ref 135–145)

## 2023-05-16 LAB — CBC
HCT: 33.9 % — ABNORMAL LOW (ref 36.0–46.0)
Hemoglobin: 10.8 g/dL — ABNORMAL LOW (ref 12.0–15.0)
MCH: 26.7 pg (ref 26.0–34.0)
MCHC: 31.9 g/dL (ref 30.0–36.0)
MCV: 83.7 fL (ref 80.0–100.0)
Platelets: 344 10*3/uL (ref 150–400)
RBC: 4.05 MIL/uL (ref 3.87–5.11)
RDW: 14.4 % (ref 11.5–15.5)
WBC: 10.3 10*3/uL (ref 4.0–10.5)
nRBC: 0 % (ref 0.0–0.2)

## 2023-05-16 MED ORDER — HYDROXYZINE HCL 25 MG PO TABS
25.0000 mg | ORAL_TABLET | Freq: Three times a day (TID) | ORAL | Status: DC | PRN
  Administered 2023-05-16 – 2023-05-18 (×5): 25 mg via ORAL
  Filled 2023-05-16 (×5): qty 1

## 2023-05-16 NOTE — Evaluation (Signed)
Physical Therapy Evaluation Patient Details Name: Marisa Nunez MRN: [id number] DOB: October 01, 1945 Today's Date: 05/16/2023  History of Present Illness  Pt is a 78 yo female presenting 1/16 with shuffled gait and AMS. CT head unremarkable, CXR without PNA. Found to be in afib with RVR; UA indicative of infection, hyponatremia, borderline AKI. Recently admitted 1/10 - 05/10/2023 for heart failure exacerbation. PMH of dementia, HFrEF (EF 45-50% 03/08/24) with new diagnosis of severe tricuspid regurgitation, HTN, HLD, AF on Xarelto   Clinical Impression  Pt in bed upon arrival of PT, agreeable to evaluation at this time. Prior to admission the pt was ambulating without assistance, pt reports frequent tripping but no falls. She required at least single UE support and minA to complete sit-stand transfers and hallway ambulation this session, and benefits from chair follow for seated rest due to poor activity tolerance and limited insight to fatigue and need for rest. The pt demos increased pursed lip breathing, restlessness, and needs max cues for safety and following commands. Given current need for assistance with all OOB mobility, recommend continued inpatient rehab <3hours/day to facilitate return to independence and eventual return home with family support.         If plan is discharge home, recommend the following: A little help with walking and/or transfers;A little help with bathing/dressing/bathroom;Assistance with cooking/housework;Direct supervision/assist for medications management;Direct supervision/assist for financial management;Assist for transportation;Help with stairs or ramp for entrance;Supervision due to cognitive status   Can travel by private vehicle   Yes    Equipment Recommendations None recommended by PT  Recommendations for Other Services       Functional Status Assessment Patient has had a recent decline in their functional status and demonstrates the ability to make  significant improvements in function in a reasonable and predictable amount of time.     Precautions / Restrictions Precautions Precautions: Fall Precaution Comments: h/o dementia, watch O2 Restrictions Weight Bearing Restrictions Per Provider Order: No      Mobility  Bed Mobility               General bed mobility comments: received in recliner and in recliner on therapist exit    Transfers Overall transfer level: Needs assistance Equipment used: None Transfers: Sit to/from Stand Sit to Stand: Min assist           General transfer comment: light assist for rise and to steady in stance    Ambulation/Gait Ambulation/Gait assistance: Min assist Gait Distance (Feet): 25 Feet (+ 67ft) Assistive device: None Gait Pattern/deviations: Step-through pattern, Decreased stride length, Drifts right/left, Narrow base of support Gait velocity: decreased Gait velocity interpretation: <1.31 ft/sec, indicative of household ambulator   General Gait Details: pt with increased drifting and poor activity tolerance, unable to give much warning before needing to urgently sit/rest. VSS despite difficulty getting reading     Balance Overall balance assessment: Mild deficits observed, not formally tested                                           Pertinent Vitals/Pain Pain Assessment Pain Assessment: No/denies pain    Home Living Family/patient expects to be discharged to:: Private residence Living Arrangements: Children Available Help at Discharge: Family;Available PRN/intermittently Type of Home: House Home Access: Stairs to enter Entrance Stairs-Rails: Right;Left;Can reach both Entrance Stairs-Number of Steps: 4   Home Layout: One level Home Equipment: None  Prior Function Prior Level of Function : Needs assist             Mobility Comments: pt reports no issues and no falls, frequent tripping. no DME ADLs Comments: indpendent; daughter  assists with bathing but pt usually able to do ADL and get items from refrigerator (drinks) without error     Extremity/Trunk Assessment   Upper Extremity Assessment Upper Extremity Assessment: Defer to OT evaluation    Lower Extremity Assessment Lower Extremity Assessment: Generalized weakness    Cervical / Trunk Assessment Cervical / Trunk Assessment: Normal;Other exceptions Cervical / Trunk Exceptions: frail, restless and rocking throughout session  Communication   Communication Communication: No apparent difficulties  Cognition Arousal: Alert Behavior During Therapy: Restless Overall Cognitive Status: History of cognitive impairments - at baseline                                 General Comments: h/o dementia; A&O to self, able to repeat [place] as location by end of session but constantly saying she is at a nursing home. pt restlessthroughout. poor STM, awareness and attention requiring frequent redirection. follows one step commands with increaed time and occasional cues for problem solving (during donning socks)        General Comments General comments (skin integrity, edema, etc.): difficulty getting pleth on SpO2, when it did get a good reading, SpO2 98 and above.        Assessment/Plan    PT Assessment Patient needs continued PT services  PT Problem List Decreased activity tolerance;Decreased balance;Decreased mobility;Decreased coordination;Decreased knowledge of use of DME;Decreased safety awareness       PT Treatment Interventions DME instruction;Gait training;Functional mobility training;Therapeutic activities;Balance training;Therapeutic exercise;Patient/family education    PT Goals (Current goals can be found in the Care Plan section)  Acute Rehab PT Goals Patient Stated Goal: return home PT Goal Formulation: With patient/family Time For Goal Achievement: 05/30/23 Potential to Achieve Goals: Fair    Frequency Min 1X/week         AM-PAC PT "6 Clicks" Mobility  Outcome Measure Help needed turning from your back to your side while in a flat bed without using bedrails?: A Little Help needed moving from lying on your back to sitting on the side of a flat bed without using bedrails?: A Little Help needed moving to and from a bed to a chair (including a wheelchair)?: A Little Help needed standing up from a chair using your arms (e.g., wheelchair or bedside chair)?: A Little Help needed to walk in hospital room?: A Lot Help needed climbing 3-5 steps with a railing? : Total 6 Click Score: 15    End of Session Equipment Utilized During Treatment: Gait belt Activity Tolerance: Patient tolerated treatment well Patient left: in chair;with call bell/phone within reach Nurse Communication: Mobility status PT Visit Diagnosis: Other abnormalities of gait and mobility (R26.89);Muscle weakness (generalized) (M62.81);Unsteadiness on feet (R26.81)    Time: 5284-1324 PT Time Calculation (min) (ACUTE ONLY): 30 min   Charges:   PT Evaluation $PT Eval Moderate Complexity: 1 Mod   PT General Charges $$ ACUTE PT VISIT: 1 Visit         Vickki Muff, PT, DPT   Acute Rehabilitation Department Office [phone] Secure Chat Communication Preferred  Ronnie Derby 05/16/2023, 3:00 PM

## 2023-05-16 NOTE — Care Management Obs Status (Signed)
MEDICARE OBSERVATION STATUS NOTIFICATION   Patient Details  Name: ALLA BRANDENBERGER MRN: [id number] Date of Birth: 08-Dec-1945   Medicare Observation Status Notification Given:  Yes    Lawerance Sabal, RN 05/16/2023, 1:56 PM

## 2023-05-16 NOTE — Plan of Care (Signed)
  Problem: Education: Goal: Knowledge of General Education information will improve Description: Including pain rating scale, medication(s)/side effects and non-pharmacologic comfort measures 05/16/2023 1900 by Virgina Norfolk, RN Outcome: Progressing 05/16/2023 1900 by Virgina Norfolk, RN Outcome: Not Progressing   Problem: Health Behavior/Discharge Planning: Goal: Ability to manage health-related needs will improve 05/16/2023 1900 by Virgina Norfolk, RN Outcome: Progressing 05/16/2023 1900 by Virgina Norfolk, RN Outcome: Not Progressing   Problem: Clinical Measurements: Goal: Ability to maintain clinical measurements within normal limits will improve 05/16/2023 1900 by Virgina Norfolk, RN Outcome: Progressing 05/16/2023 1900 by Virgina Norfolk, RN Outcome: Not Progressing Goal: Will remain free from infection 05/16/2023 1900 by Virgina Norfolk, RN Outcome: Progressing 05/16/2023 1900 by Virgina Norfolk, RN Outcome: Not Progressing Goal: Diagnostic test results will improve 05/16/2023 1900 by Virgina Norfolk, RN Outcome: Progressing 05/16/2023 1900 by Virgina Norfolk, RN Outcome: Not Progressing Goal: Respiratory complications will improve 05/16/2023 1900 by Virgina Norfolk, RN Outcome: Progressing 05/16/2023 1900 by Virgina Norfolk, RN Outcome: Not Progressing Goal: Cardiovascular complication will be avoided 05/16/2023 1900 by Virgina Norfolk, RN Outcome: Progressing 05/16/2023 1900 by Virgina Norfolk, RN Outcome: Not Progressing   Problem: Activity: Goal: Risk for activity intolerance will decrease 05/16/2023 1900 by Virgina Norfolk, RN Outcome: Progressing 05/16/2023 1900 by Virgina Norfolk, RN Outcome: Not Progressing   Problem: Nutrition: Goal: Adequate nutrition will be maintained 05/16/2023 1900 by Virgina Norfolk, RN Outcome: Progressing 05/16/2023 1900 by Virgina Norfolk, RN Outcome: Not Progressing   Problem: Coping: Goal: Level of anxiety will decrease 05/16/2023 1900 by Virgina Norfolk, RN Outcome: Progressing 05/16/2023 1900 by Virgina Norfolk, RN Outcome: Not Progressing   Problem: Elimination: Goal: Will not experience complications related to bowel motility 05/16/2023 1900 by Virgina Norfolk, RN Outcome: Progressing 05/16/2023 1900 by Virgina Norfolk, RN Outcome: Not Progressing Goal: Will not experience complications related to urinary retention 05/16/2023 1900 by Virgina Norfolk, RN Outcome: Progressing 05/16/2023 1900 by Virgina Norfolk, RN Outcome: Not Progressing   Problem: Pain Managment: Goal: General experience of comfort will improve and/or be controlled 05/16/2023 1900 by Virgina Norfolk, RN Outcome: Progressing 05/16/2023 1900 by Virgina Norfolk, RN Outcome: Not Progressing   Problem: Safety: Goal: Ability to remain free from injury will improve 05/16/2023 1900 by Virgina Norfolk, RN Outcome: Progressing 05/16/2023 1900 by Virgina Norfolk, RN Outcome: Not Progressing   Problem: Skin Integrity: Goal: Risk for impaired skin integrity will decrease 05/16/2023 1900 by Virgina Norfolk, RN Outcome: Progressing 05/16/2023 1900 by Virgina Norfolk, RN Outcome: Not Progressing

## 2023-05-16 NOTE — Evaluation (Signed)
Occupational Therapy Evaluation Patient Details Name: Marisa Nunez MRN: [id number] DOB: [birth year] Today's Date: 05/16/2023   History of Present Illness Pt is a 78 yo female presenting 1/16 with shuffled gait and AMS. CT head unremarkable, CXR without PNA. Found to be in afib with RVR; UA indicative of infection, hyponatremia, borderline AKI. Recently admitted 1/10 - 05/10/2023 for heart failure exacerbation. PMH of dementia, HFrEF (EF 45-50% 03/08/24) with new diagnosis of severe tricuspid regurgitation, HTN, HLD, AF on Xarelto   Clinical Impression   PTA, pt lived with daughter who reports she was mod I for mobility without an AD and BADL; daughter does help with bathing. Upon eval, pt with decreased balance, activity tolerance, pursed lip breathing, and safety. Pt needing frequent cues for following commands and and occasionally for sequencing ADL. Needing grossly min A for mobility and ADL at this time. Daughter reports she cannot be with pt 24/7 and the pt's grandson cannot provide assist at home. Patient will benefit from continued inpatient follow up therapy, <3 hours/day       If plan is discharge home, recommend the following: A little help with walking and/or transfers;A little help with bathing/dressing/bathroom;Assistance with cooking/housework;Direct supervision/assist for medications management;Direct supervision/assist for financial management;Assist for transportation;Help with stairs or ramp for entrance;Supervision due to cognitive status    Functional Status Assessment  Patient has had a recent decline in their functional status and demonstrates the ability to make significant improvements in function in a reasonable and predictable amount of time.  Equipment Recommendations  Other (comment) (defer to next venue of care)    Recommendations for Other Services       Precautions / Restrictions Precautions Precautions: Fall Precaution Comments: h/o dementia, watch  O2 Restrictions Weight Bearing Restrictions Per Provider Order: No      Mobility Bed Mobility               General bed mobility comments: received in recliner and in recliner on therapist exit    Transfers Overall transfer level: Needs assistance Equipment used: None Transfers: Sit to/from Stand Sit to Stand: Min assist           General transfer comment: light assist for rise      Balance Overall balance assessment: No apparent balance deficits (not formally assessed)                                         ADL either performed or assessed with clinical judgement   ADL Overall ADL's : Needs assistance/impaired     Grooming: Set up;Sitting   Upper Body Bathing: Set up;Sitting   Lower Body Bathing: Sit to/from stand;Minimal assistance   Upper Body Dressing : Set up;Sitting   Lower Body Dressing: Minimal assistance;Sit to/from stand Lower Body Dressing Details (indicate cue type and reason): cues for donning socks Toilet Transfer: Minimal assistance;Ambulation Toilet Transfer Details (indicate cue type and reason): 1 person HHA         Functional mobility during ADLs: Minimal assistance       Vision   Additional Comments: not formally assessed. Able to make good eye contact and locate items on tray with increased time     Perception         Praxis         Pertinent Vitals/Pain       Extremity/Trunk Assessment Upper Extremity Assessment Upper Extremity Assessment: Generalized weakness  Lower Extremity Assessment Lower Extremity Assessment: Defer to PT evaluation       Communication Communication Communication: No apparent difficulties   Cognition Arousal: Alert Behavior During Therapy: Restless Overall Cognitive Status: History of cognitive impairments - at baseline                                 General Comments: h/o dementia; A&O to self, able to repeat Marisa Nunez as location by end of session  but constantly saying she is at a nursing home. pt restlessthroughout. poor STM, awareness and attention requiring frequent redirection. follows one step commands with increaed time and occasional cues for problem solving (during donning socks)     General Comments  called pt's daughter on phone who reports she is with her at home and assists with bathing, but then reports she also works for [employer] and pt is home with son but she cannot ask son to physically asisst pt even for walking. Multiple attempts to take Ambulatory SpO2 but poor pleth with pt anxiously moving even when seated and by time reading was received SpO2 >95 all attempts.    Exercises     Shoulder Instructions      Home Living Family/patient expects to be discharged to:: Private residence Living Arrangements: Children Available Help at Discharge: Family;Available PRN/intermittently Type of Home: House Home Access: Stairs to enter [employer] of Steps: 4 Entrance Stairs-Rails: Right;Left;Can reach both Home Layout: One level     Bathroom Shower/Tub: Tub/shower unit;Walk-in shower         Home Equipment: None          Prior Functioning/Environment Prior Level of Function : Needs assist             Mobility Comments: pt reports no issues and no falls, frequent tripping. no DME ADLs Comments: indpendent; daughter assists with bathing but pt usually able to do ADL and get items from refrigerator (drinks) without error        OT Problem List: Decreased strength;Impaired balance (sitting and/or standing);Decreased activity tolerance;Cardiopulmonary status limiting activity      OT Treatment/Interventions: Therapeutic exercise;Self-care/ADL training;DME and/or AE instruction;Balance training;Patient/family education;Therapeutic activities;Cognitive remediation/compensation    OT Goals(Current goals can be found in the care plan section) Acute Rehab OT Goals Patient Stated Goal: go home OT Goal  Formulation: With patient Time For Goal Achievement: 05/30/23 Potential to Achieve Goals: Good  OT Frequency: Min 1X/week    Co-evaluation              AM-PAC OT "6 Clicks" Daily Activity     Outcome Measure Help from another person eating meals?: None Help from another person taking care of personal grooming?: A Little Help from another person toileting, which includes using toliet, bedpan, or urinal?: A Little Help from another person bathing (including washing, rinsing, drying)?: A Little Help from another person to put on and taking off regular upper body clothing?: A Little Help from another person to put on and taking off regular lower body clothing?: A Little 6 Click Score: 19   End of Session Equipment Utilized During Treatment: Gait belt Nurse Communication: Mobility status  Activity Tolerance: Patient tolerated treatment well Patient left: in chair;with call bell/phone within reach (no alarm in chair on arrival and pt in chair on arrival)  OT Visit Diagnosis: Unsteadiness on feet (R26.81);Muscle weakness (generalized) (M62.81);Other symptoms and signs involving cognitive function  Time: 1191-4782 OT Time Calculation (min): 18 min Charges:  OT General Charges $OT Visit: 1 Visit OT Evaluation $OT Eval Low Complexity: 1 Low  Marisa Nunez, OTR/L [facility] Acute Rehabilitation Office: [phone]   Marisa Nunez 05/16/2023, 2:06 PM

## 2023-05-16 NOTE — Plan of Care (Signed)

## 2023-05-16 NOTE — Progress Notes (Addendum)
HD#0 Subjective:   Summary: Marisa Nunez is a 78 y.o. female with PMH of dementia with behavioral disturbances, atrial fibrillation on xarelto, HFrEF (LV EF 45-50%) who presents with disorientation and weakness and admitted for UTI.  Overnight Events: none  Patient seen sitting in chair. Anxious and at times short of breath. Denies any other associated symptoms.   Objective:  Vital signs in last 24 hours: Vitals:   05/16/23 0951 05/16/23 1009 05/16/23 1018 05/16/23 1406  BP: 132/69 123/82  118/72  Pulse: 90 76 72 83  Resp: 20 (!) 22 20 20   Temp: 98 F (36.7 C) 97.8 F (36.6 C)  98.3 F (36.8 C)  TempSrc: Oral Axillary  Oral  SpO2: 93% 94% 98% 96%   Supplemental O2: Nasal Cannula SpO2: 98 %   Physical Exam:  Constitutional: chronically ill-appearing female sitting in chair, in no acute distress Cardiovascular: Regular rate, irregular rhythm Pulmonary/Chest: normal work of breathing on 1L [place], lungs CTAB Neurological: alert & oriented x 2 to self and place Psych: anxious  There were no vitals filed for this visit.   Intake/Output Summary (Last 24 hours) at 05/16/2023 0607 Last data filed at 05/15/2023 2024 Gross per 24 hour  Intake 631.19 ml  Output --  Net 631.19 ml   Net IO Since Admission: 631.19 mL [05/16/23 0607]  Pertinent Labs:    Latest Ref Rng & Units 05/16/2023    4:02 AM 05/14/2023   11:13 PM 05/14/2023    8:31 PM  CBC  WBC 4.0 - 10.5 K/uL 10.3     Hemoglobin 12.0 - 15.0 g/dL 62.8  31.5  17.6   Hematocrit 36.0 - 46.0 % 33.9  38.0  40.0   Platelets 150 - 400 K/uL 344          Latest Ref Rng & Units 05/16/2023    4:02 AM 05/14/2023   11:13 PM 05/14/2023    8:31 PM  CMP  Glucose 70 - 99 mg/dL 160   737   BUN 8 - 23 mg/dL 17   24   Creatinine 1.06 - 1.00 mg/dL 2.69   4.85   Sodium 462 - 145 mmol/L 133  129  131   Potassium 3.5 - 5.1 mmol/L 4.5  3.8  4.0   Chloride 98 - 111 mmol/L 98   94   CO2 22 - 32 mmol/L 27     Calcium 8.9 - 10.3 mg/dL  8.5       Imaging: No results found.  Assessment/Plan:   Principal Problem:   UTI (urinary tract infection) Active Problems:   Altered mental status   Patient Summary: Marisa Nunez is a 78 y.o. with a pertinent PMH of dementia with behavioral disturbances, atrial fibrillation on xarelto, HFrEF (LV EF 45-50%), who presented with disorientation and weakness and admitted for UTI.  # UTI Multiple species noted on culture. So far no growth on blood culture.  -Continue Macrobid 100 mg BID (day 1/4)  # Hyponatremia Sodium improved to 133 today. Urine labs not collected yet.   # AKI, resolved Resolved today after IVF. Holding home lasix, assess tomorrow.   # Chronic Anemia Hgb 10.8 which is at baseline of 10-11. No signs of bleeding. Monitor.  # Afib Rate controlled.  -Continue metoprolol 50 mg daily and Xarelto 15 mg daily   # Cough RVP negative. Cough improved today. Lung exam unremarkable.  -Tessalon perles PRN   # Severe tricuspid regurgitation # Moderate to severe mitral valve  regurgitation Noted during recent hospitalization. Plan established with cardiology prior to discharge for no further inpatient work-up or treatment, and she has OP follow-up scheduled for 01/23. Plan: -Continue plan for OP follow up  # HFrEF (EF 45-50%) # HTN Normotensive. Not volume overloaded on exam. Holding Lasix.  -Continue metoprolol   # Dementia with behavioral disturbances  # Anxiety  OP regimen is citalopram 20 mg BID, depakote 250 mg BID, memantine 20 mg BID, quetiapine 50 mg BID, sertraline 50 mg daily, melatonin daily at bedtime. She was diagnosed in 2020 but had signs of dementia prior to that. Her daughter [name] explains that she has had noticeable progression in her disease over the last year. I did counsel her and her son that she may require longer than expected to return to a relative baseline after each hospitalization, and that she may never fully reach her prior  baseline again. We discussed the progressive nature of her disease and that her behavioral changes may have some component related to that. -NO benzodiazepines due to agitation  -Stopped sertraline  -Continue home citalopram, depakote, memantine, quetiapine, melatonin (patient takes PM meds at 7PM) -Added hydroxyzine 25 mg TID PRN   Diet: Normal IVF: None,None VTE: DOAC Code: DNR/DNI PT/OT recs: SNF TOC recs: consulted for SNF Family Update: updated daughter over phone  Dispo: Anticipated discharge to Skilled nursing facility pending medical stability   Rana Snare, DO Internal Medicine Resident PGY-2 Pager: [phone] Please contact the on call pager after 5 pm and on weekends at [phone].

## 2023-05-17 DIAGNOSIS — F03918 Unspecified dementia, unspecified severity, with other behavioral disturbance: Secondary | ICD-10-CM | POA: Diagnosis not present

## 2023-05-17 DIAGNOSIS — F411 Generalized anxiety disorder: Secondary | ICD-10-CM | POA: Diagnosis not present

## 2023-05-17 LAB — BASIC METABOLIC PANEL
Anion gap: 11 (ref 5–15)
BUN: 19 mg/dL (ref 8–23)
CO2: 24 mmol/L (ref 22–32)
Calcium: 8.7 mg/dL — ABNORMAL LOW (ref 8.9–10.3)
Chloride: 96 mmol/L — ABNORMAL LOW (ref 98–111)
Creatinine, Ser: 0.72 mg/dL (ref 0.44–1.00)
GFR, Estimated: >60 mL/min (ref >60)
Glucose, Bld: 108 mg/dL — ABNORMAL HIGH (ref 70–99)
Potassium: 4.3 mmol/L (ref 3.5–5.1)
Sodium: 131 mmol/L — ABNORMAL LOW (ref 135–145)

## 2023-05-17 LAB — CBC
HCT: 34.7 % — ABNORMAL LOW (ref 36.0–46.0)
Hemoglobin: 11.2 g/dL — ABNORMAL LOW (ref 12.0–15.0)
MCH: 26.9 pg (ref 26.0–34.0)
MCHC: 32.3 g/dL (ref 30.0–36.0)
MCV: 83.2 fL (ref 80.0–100.0)
Platelets: 331 10*3/uL (ref 150–400)
RBC: 4.17 MIL/uL (ref 3.87–5.11)
RDW: 14.4 % (ref 11.5–15.5)
WBC: 9.3 10*3/uL (ref 4.0–10.5)
nRBC: 0 % (ref 0.0–0.2)

## 2023-05-17 MED ORDER — BUSPIRONE HCL 5 MG PO TABS
5.0000 mg | ORAL_TABLET | Freq: Two times a day (BID) | ORAL | Status: DC
  Administered 2023-05-17 – 2023-05-18 (×3): 5 mg via ORAL
  Filled 2023-05-17 (×3): qty 1

## 2023-05-17 NOTE — TOC Progression Note (Addendum)
Transition of Care [facility]) - Progression Note    Patient Details  Name: Marisa Nunez MRN: [id number] Date of Birth: [birth year]  Transition of Care [facility]) CM/SW Contact  Marliss Coots, LCSW Phone Number: 05/17/2023, 8:58 AM  Clinical Narrative:     8:58 AM CSW attempted to call patient's daughter (patient not currently fully oriented with a history of dementia), [name], regarding SNF recommendation. There was no response and a voicemail was left.  11:05 AM [name], [name], called CSW to follow up on phone call. CSW informed [name] of therapy recommendation of patient discharge to SNF. CSW educated [place] on SNF process and insurance coverage. [name] expressed interest in patient discharging to SNFs within [facility]. [name] also inquired about memory care facilities and memory care facility financial assistance resources for patient, as she receives social security but does not qualify for Medicaid benefits. [name] requested CSW to email memory care facilities, memory care facility financial assistance resources, and SNF options ([email or web address]).  11:45 AM CSW Therapist, music requested resources (list of local memory care facilities from [employer]co.za; financial assistance programs through [employer], Family Caregiver Support Program, [place] [employer] on Aging, [place] and [employer] for Adult [employer], Physicist, medical (for additional financial and memory care facility resources)). CSW scanned additional clinicals (progress notes, H&P, signed fl2) requested for PASARR manual review.  Expected Discharge Plan: Skilled Nursing Facility Barriers to Discharge: Continued Medical Work up  Expected Discharge Plan and Services In-house Referral: Clinical Social Work     Living arrangements for the past 2 months: Single Family Home                                       Social Determinants of Health (SDOH)  Interventions SDOH Screenings   Food Insecurity: No Food Insecurity (05/15/2023)  Housing: Low Risk  (05/15/2023)  Transportation Needs: No Transportation Needs (05/15/2023)  Utilities: Not At Risk (05/15/2023)  Alcohol Screen: Low Risk  (01/28/2023)  Depression (PHQ2-9): Low Risk  (01/28/2023)  Financial Resource Strain: Low Risk  (01/28/2023)  Physical Activity: Sufficiently Active (01/28/2023)  Social Connections: Unknown (05/15/2023)  Stress: No Stress Concern Present (01/28/2023)  Tobacco Use: Medium Risk (05/14/2023)  Health Literacy: Adequate Health Literacy (01/28/2023)    Readmission Risk Interventions    11/28/2021   12:13 PM  Readmission Risk Prevention Plan  Transportation Screening Complete  PCP or Specialist Appt within 3-5 Days Complete  HRI or Home Care Consult Complete  Social Work Consult for Recovery Care Planning/Counseling Complete  Palliative Care Screening Not Applicable  Medication Review Oceanographer) Complete

## 2023-05-17 NOTE — [place] FL2 (Signed)
[place] MEDICAID FL2 LEVEL OF CARE FORM     IDENTIFICATION  Patient Name: Marisa Nunez Birthdate: 1945-08-15 Sex: female Admission Date (Current Location): 05/14/2023  [facility] and [place] Number:  Producer, television/film/video and Address:  The . [facility], [address] [place], [place] [address]      Provider Number: [id number]  Attending Physician Name and Address:  Gust Rung, DO  Relative Name and Phone Number:  Nelma Rothman; [phone]; Daughter    Current Level of Care: Hospital Recommended Level of Care: Skilled Nursing Facility Prior Approval Number:    Date Approved/Denied:   PASRR Number:    Discharge Plan:      Current Diagnoses: Patient Active Problem List   Diagnosis Date Noted   UTI (urinary tract infection) 05/15/2023   Altered mental status 05/15/2023   Acute on chronic HFrEF (heart failure with reduced ejection fraction) (HCC) 05/08/2023   Pleural effusion on right 05/08/2023   Healthcare maintenance 01/28/2023   Dementia with behavioral disturbance (HCC) 12/26/2022   History of cerebral infarction 10/29/2020   History of right breast cancer 02/25/2018   Mixed hyperlipidemia 05/06/2017   Osteopenia of multiple sites 05/06/2017   Essential hypertension 10/25/2015   Persistent atrial fibrillation (HCC) 10/25/2015    Orientation RESPIRATION BLADDER Height & Weight     Place  O2 (2L nasal cannula) Continent Weight:   Height:     BEHAVIORAL SYMPTOMS/MOOD NEUROLOGICAL BOWEL NUTRITION STATUS      Incontinent Diet (Please see dc summary)  AMBULATORY STATUS COMMUNICATION OF NEEDS Skin   Limited Assist Verbally Normal                       Personal Care Assistance Level of Assistance  Bathing, Feeding, Dressing Bathing Assistance: Limited assistance Feeding assistance: Limited assistance Dressing Assistance: Limited assistance     Functional Limitations Info             SPECIAL CARE FACTORS FREQUENCY   OT (By licensed OT), PT (By licensed PT)     PT Frequency: 5x OT Frequency: 5x            Contractures Contractures Info: Not present    Additional Factors Info  Code Status, Allergies Code Status Info: DNR- Limited- Do Not Intubate/DNI Allergies Info: Influenza Virus Vaccine; Pneumococcal Vaccine; Myrbetriq (mirabegron Er); Ativan (lorazepam)           Current Medications (05/17/2023):  This is the current hospital active medication list Current Facility-Administered Medications  Medication Dose Route Frequency Provider Last Rate Last Admin   acetaminophen (TYLENOL) tablet 650 mg  650 mg Oral Q6H PRN Champ Mungo, DO   650 mg at 05/16/23 1941   Or   acetaminophen (TYLENOL) suppository 650 mg  650 mg Rectal Q6H PRN Champ Mungo, DO       albuterol (PROVENTIL) (2.5 MG/3ML) 0.083% nebulizer solution 2.5 mg  2.5 mg Nebulization Q4H PRN Champ Mungo, DO   2.5 mg at 05/16/23 1959   benzonatate (TESSALON) capsule 100 mg  100 mg Oral TID PRN Champ Mungo, DO   100 mg at 05/17/23 1106   busPIRone (BUSPAR) tablet 5 mg  5 mg Oral BID Rana Snare, DO   5 mg at 05/17/23 1104   citalopram (CELEXA) tablet 20 mg  20 mg Oral BID Champ Mungo, DO   20 mg at 05/17/23 1105   divalproex (DEPAKOTE SPRINKLE) capsule 250 mg  250 mg Oral BID Champ Mungo, DO  250 mg at 05/17/23 1106   hydrOXYzine (ATARAX) tablet 25 mg  25 mg Oral TID PRN Rana Snare, DO   25 mg at 05/17/23 1104   melatonin tablet 10 mg  10 mg Oral QHS Gwenevere Abbot, MD   10 mg at 05/16/23 1940   memantine (NAMENDA) tablet 20 mg  20 mg Oral BID Gwenevere Abbot, MD   20 mg at 05/17/23 1104   metoprolol succinate (TOPROL-XL) 24 hr tablet 50 mg  50 mg Oral Daily Champ Mungo, DO   50 mg at 05/17/23 1104   multivitamin with minerals tablet 1 tablet  1 tablet Oral Q breakfast Champ Mungo, DO   1 tablet at 05/17/23 1104   nitrofurantoin (macrocrystal-monohydrate) (MACROBID) capsule 100 mg  100 mg Oral Q12H Champ Mungo, DO   100 mg at 05/17/23  1106   ondansetron (ZOFRAN) injection 4 mg  4 mg Intravenous Q8H PRN Rana Snare, DO   4 mg at 05/16/23 1403   potassium chloride SA (KLOR-CON M) CR tablet 10 mEq  10 mEq Oral BID Champ Mungo, DO   10 mEq at 05/17/23 1105   QUEtiapine (SEROQUEL) tablet 50 mg  50 mg Oral BID Gwenevere Abbot, MD   50 mg at 05/17/23 1105   Rivaroxaban (XARELTO) tablet 15 mg  15 mg Oral Q supper Champ Mungo, DO   15 mg at 05/16/23 1521     Discharge Medications: Please see discharge summary for a list of discharge medications.  Relevant Imaging Results:  Relevant Lab Results:   Additional Information SS# [id number]  Allis Quirarte E Zyrah Wiswell, LCSW

## 2023-05-17 NOTE — Progress Notes (Addendum)
HD#0 Subjective:   Summary: Marisa Nunez is a 78 y.o. female with PMH of dementia with behavioral disturbances, atrial fibrillation on xarelto, HFrEF (LV EF 45-50%) who presents with disorientation and weakness and admitted for UTI.  Overnight Events: none  Noted by RN very anxious and at times wandering. Was sleeping in bed but easily awakened. Denies any pain or discomfort. No restlessness or tremors. No acute concerns.   Objective:  Vital signs in last 24 hours: Vitals:   05/16/23 1406 05/16/23 2015 05/16/23 2100 05/17/23 0513  BP: 118/72 110/69 118/75 107/73  Pulse: 83  85 60  Resp: 20 18 19 18   Temp: 98.3 F (36.8 C) 98.4 F (36.9 C) 98.2 F (36.8 C) 97.8 F (36.6 C)  TempSrc: Oral Oral Oral Oral  SpO2: 96% 99% 97% 98%   Supplemental O2: Room Air  Physical Exam:  Constitutional: chronically ill-appearing female laying in bed asleep but easily awakens to voice, in no acute distress Cardiovascular: regular rate Pulmonary/Chest: normal work of breathing on RA Neurological: alert & oriented x 2 to self and place yesterday   There were no vitals filed for this visit.   Intake/Output Summary (Last 24 hours) at 05/17/2023 0550 Last data filed at 05/16/2023 1220 Gross per 24 hour  Intake 600 ml  Output --  Net 600 ml   Net IO Since Admission: 1,231.19 mL [05/17/23 0550]  Pertinent Labs:    Latest Ref Rng & Units 05/17/2023    4:17 AM 05/16/2023    4:02 AM 05/14/2023   11:13 PM  CBC  WBC 4.0 - 10.5 K/uL 9.3  10.3    Hemoglobin 12.0 - 15.0 g/dL 64.4  03.4  74.2   Hematocrit 36.0 - 46.0 % 34.7  33.9  38.0   Platelets 150 - 400 K/uL 331  344         Latest Ref Rng & Units 05/17/2023    4:17 AM 05/16/2023    4:02 AM 05/14/2023   11:13 PM  CMP  Glucose 70 - 99 mg/dL 595  638    BUN 8 - 23 mg/dL 19  17    Creatinine 7.56 - 1.00 mg/dL 4.33  2.95    Sodium 188 - 145 mmol/L 131  133  129   Potassium 3.5 - 5.1 mmol/L 4.3  4.5  3.8   Chloride 98 - 111 mmol/L 96   98    CO2 22 - 32 mmol/L 24  27    Calcium 8.9 - 10.3 mg/dL 8.7  8.5      Imaging: No results found.  Assessment/Plan:   Principal Problem:   UTI (urinary tract infection) Active Problems:   Altered mental status   Patient Summary: Marisa Nunez is a 78 y.o. with a pertinent PMH of dementia with behavioral disturbances, atrial fibrillation on xarelto, HFrEF (LV EF 45-50%), who presented with disorientation and weakness and admitted for UTI.  # Physical Deconditioning Lives with daughter. Noted decreased mobility. PT and OT evaluated. Pending SNF placement. TOC consulted.  -Appreciate PT/OT assistance  -F/u TOC recs  # UTI Multiple species noted on culture. So far no growth on blood culture.  -Macrobid 100 mg BID (day 2/4)  # Hyponatremia Sodium 131 from 133. Urine labs not collected yet. Check with RN regarding collection.   # Dementia with behavioral disturbances  # Anxiety  OP regimen is citalopram 20 mg BID, depakote 250 mg BID, memantine 20 mg BID, quetiapine 50 mg BID, melatonin daily  at bedtime. She was diagnosed in 2020 but had signs of dementia prior to that. Her daughter [name] explains that she has had noticeable progression in her disease over the last year. Discussion done with family about the progressive nature of her disease and that her behavioral changes may have some component related to that. Stopped home sertraline given on citalopram and was noted by family that helped more. Concern for wandering per RN, daughter is aware, difficult to redirect, add sitter.  -NO benzodiazepines due to agitation  -Continue home citalopram, depakote, memantine, quetiapine, melatonin (patient takes PM meds at 7PM) -Continue hydroxyzine 25 mg TID PRN  -Added Buspar 5 mg BID -Added 1:1 sitter for safety obs  Stable Chronic Conditions: # Afib: Rate controlled. Continue metoprolol 50 mg daily and Xarelto 15 mg daily  # HFrEF (EF 45-50%) & HTN: Normotensive. Not volume  overloaded on exam. Holding Lasix. Continue metoprolol  # Severe tricuspid regurgitation & Moderate to severe mitral valve regurgitation: Noted during recent hospitalization. Plan established with cardiology prior to discharge for no further inpatient work-up or treatment, and she has OP follow-up scheduled for 01/23. # Chronic Anemia: Hgb stable and at baseline of 10-11. No signs of bleeding.   Diet: Normal IVF: None,None VTE: DOAC Code: DNR/DNI PT/OT recs: SNF TOC recs: consulted for SNF Family Update: updated daughter over phone 1/18  Dispo: Anticipated discharge to Skilled nursing facility pending medical stability and placement  Rana Snare, DO Internal Medicine Resident PGY-2 Pager: [phone] Please contact the on call pager after 5 pm and on weekends at [phone].

## 2023-05-18 DIAGNOSIS — F03918 Unspecified dementia, unspecified severity, with other behavioral disturbance: Secondary | ICD-10-CM | POA: Diagnosis not present

## 2023-05-18 DIAGNOSIS — N3000 Acute cystitis without hematuria: Secondary | ICD-10-CM | POA: Diagnosis not present

## 2023-05-18 LAB — BASIC METABOLIC PANEL
Anion gap: 6 (ref 5–15)
BUN: 30 mg/dL — ABNORMAL HIGH (ref 8–23)
CO2: 31 mmol/L (ref 22–32)
Calcium: 8.8 mg/dL — ABNORMAL LOW (ref 8.9–10.3)
Chloride: 94 mmol/L — ABNORMAL LOW (ref 98–111)
Creatinine, Ser: 0.94 mg/dL (ref 0.44–1.00)
GFR, Estimated: >60 mL/min (ref >60)
Glucose, Bld: 109 mg/dL — ABNORMAL HIGH (ref 70–99)
Potassium: 4.8 mmol/L (ref 3.5–5.1)
Sodium: 131 mmol/L — ABNORMAL LOW (ref 135–145)

## 2023-05-18 MED ORDER — QUETIAPINE FUMARATE 25 MG PO TABS
75.0000 mg | ORAL_TABLET | Freq: Every day | ORAL | Status: DC
  Administered 2023-05-18 – 2023-05-24 (×7): 75 mg via ORAL
  Filled 2023-05-18 (×7): qty 3

## 2023-05-18 MED ORDER — BUSPIRONE HCL 5 MG PO TABS
5.0000 mg | ORAL_TABLET | Freq: Two times a day (BID) | ORAL | Status: DC
  Administered 2023-05-18 – 2023-05-25 (×14): 5 mg via ORAL
  Filled 2023-05-18 (×14): qty 1

## 2023-05-18 MED ORDER — QUETIAPINE FUMARATE 25 MG PO TABS: 75.0000 mg | ORAL_TABLET | Freq: Every day | ORAL | Status: DC

## 2023-05-18 MED ORDER — CITALOPRAM HYDROBROMIDE 20 MG PO TABS
20.0000 mg | ORAL_TABLET | Freq: Two times a day (BID) | ORAL | Status: DC
  Administered 2023-05-18 – 2023-05-24 (×12): 20 mg via ORAL
  Filled 2023-05-18 (×12): qty 1

## 2023-05-18 MED ORDER — QUETIAPINE FUMARATE 25 MG PO TABS
50.0000 mg | ORAL_TABLET | Freq: Every day | ORAL | Status: DC
  Administered 2023-05-19 – 2023-05-25 (×7): 50 mg via ORAL
  Filled 2023-05-18 (×7): qty 2

## 2023-05-18 MED ORDER — OLANZAPINE 2.5 MG PO TABS
2.5000 mg | ORAL_TABLET | Freq: Every day | ORAL | Status: DC | PRN
Start: 2023-05-18 — End: 2023-05-18

## 2023-05-18 MED ORDER — OLANZAPINE 2.5 MG PO TABS
2.5000 mg | ORAL_TABLET | Freq: Every day | ORAL | Status: DC | PRN
  Filled 2023-05-18: qty 1

## 2023-05-18 NOTE — TOC Progression Note (Signed)
Transition of Care [facility]) - Progression Note    Patient Details  Name: Marisa Nunez MRN: [id number] Date of Birth: 11-28-1945  Transition of Care [facility]) CM/SW Contact  Marliss Coots, LCSW Phone Number: 05/18/2023, 2:53 PM  Clinical Narrative:     2:53 PM Per Henry Must ID, screening for patient's PASRR is ongoing. Patient has a Comptroller. CSW will continue to follow.  Expected Discharge Plan: Skilled Nursing Facility Barriers to Discharge: Continued Medical Work up, Awaiting State Approval Financial controller)  Expected Discharge Plan and Services In-house Referral: Clinical Social Work   Post Acute Care Choice: Skilled Nursing Facility Living arrangements for the past 2 months: Single Family Home                                       Social Determinants of Health (SDOH) Interventions SDOH Screenings   Food Insecurity: No Food Insecurity (05/15/2023)  Housing: Low Risk  (05/15/2023)  Transportation Needs: No Transportation Needs (05/15/2023)  Utilities: Not At Risk (05/15/2023)  Alcohol Screen: Low Risk  (01/28/2023)  Depression (PHQ2-9): Low Risk  (01/28/2023)  Financial Resource Strain: Low Risk  (01/28/2023)  Physical Activity: Sufficiently Active (01/28/2023)  Social Connections: Unknown (05/15/2023)  Stress: No Stress Concern Present (01/28/2023)  Tobacco Use: Medium Risk (05/14/2023)  Health Literacy: Adequate Health Literacy (01/28/2023)    Readmission Risk Interventions    11/28/2021   12:13 PM  Readmission Risk Prevention Plan  Transportation Screening Complete  PCP or Specialist Appt within 3-5 Days Complete  HRI or Home Care Consult Complete  Social Work Consult for Recovery Care Planning/Counseling Complete  Palliative Care Screening Not Applicable  Medication Review Oceanographer) Complete

## 2023-05-18 NOTE — Progress Notes (Signed)
    HD#0 Subjective:   Summary: Marisa Nunez is a 78 y.o. female with PMH of dementia with behavioral disturbances, atrial fibrillation on xarelto, HFrEF (LV EF 45-50%) who presents with disorientation and weakness and admitted for UTI.  Patient has no new complaints today. She gives emotionless, one word answers to questions. Does not know why she is in the hospital. Informed her I would update her daughter on her care.   Objective:  Vital signs in last 24 hours: Vitals:   05/17/23 2005 05/18/23 0619 05/18/23 0657 05/18/23 0833  BP: 118/66 119/74 96/61 (!) 134/93  Pulse: 97 64 76 82  Resp: 19 18 16 18   Temp: (!) 97.4 F (36.3 C) 97.7 F (36.5 C) 98.2 F (36.8 C)   TempSrc: Oral Oral    SpO2: 99% 99% 100% 100%   Supplemental O2: Room Air  Physical Exam:  Constitutional: chronically ill-appearing female laying in bed asleep but easily awakens to voice, in no acute distress Cardiovascular: regular rate Pulmonary/Chest: normal work of breathing on RA Neurological: alert & oriented x 2 to self and place yesterday  Psych: flattened affect, gives one word responses  Assessment/Plan:   #UTI Multiple species noted on culture. Afebrile with no leukocytosis. So far no growth on blood culture.  - Macrobid 100 mg BID, last day tomorrow 1/21  #Dementia with behavioral disturbances  #Anxiety  OP regimen is citalopram 20 mg BID, depakote 250 mg BID, memantine 20 mg BID, quetiapine 50 mg BID, melatonin daily at bedtime. Per family, her dementia has been significantly worsening over the last year. She will need to go to a SNF after hospital discharge, resources given to family for memory care unit placement in the future.  - Continue citalopram, depakote, memantine, quetiapine, melatonin, buspar, adding prn olanzapine - 1:1 sitter for fall risk. This will be a barrier to her discharge to SNF  #Physical Deconditioning Lives with daughter. Noted decreased mobility. PT and OT evaluated.  Pending SNF placement. TOC consulted.  -Appreciate PT/OT assistance  -F/u TOC recs  #Hyponatremia Sodium stable at 131, asymptomatic. Urine studies pending  Chronic Conditions: Afib: Rate controlled. Continue metoprolol 50 mg daily and Xarelto 15 mg daily  HFrEF (EF 45-50%) & HTN: Normotensive. Not volume overloaded on exam. Holding Lasix. Continue metoprolol  Severe tricuspid regurgitation & moderate to severe mitral valve regurgitation: Noted during recent hospitalization. Plan established with cardiology prior to discharge for no further inpatient work-up or treatment, and she has OP follow-up scheduled for 01/23. Chronic anemia: Hgb stable and at baseline. No signs of bleeding.   Diet: Normal IVF: None,None VTE: DOAC Code: DNR/DNI PT/OT recs: SNF TOC recs: consulted for SNF  Dispo: Anticipated discharge to Skilled nursing facility pending medical stability and placement  Monna Fam, MD Internal Medicine Resident PGY-1 Pager: [phone] Please contact the on call pager after 5 pm and on weekends at [phone].

## 2023-05-18 NOTE — Plan of Care (Signed)

## 2023-05-19 DIAGNOSIS — Z1152 Encounter for screening for COVID-19: Secondary | ICD-10-CM | POA: Diagnosis not present

## 2023-05-19 DIAGNOSIS — I4891 Unspecified atrial fibrillation: Secondary | ICD-10-CM | POA: Diagnosis not present

## 2023-05-19 DIAGNOSIS — I110 Hypertensive heart disease with heart failure: Secondary | ICD-10-CM | POA: Diagnosis not present

## 2023-05-19 DIAGNOSIS — N3000 Acute cystitis without hematuria: Secondary | ICD-10-CM | POA: Diagnosis not present

## 2023-05-19 DIAGNOSIS — Z853 Personal history of malignant neoplasm of breast: Secondary | ICD-10-CM | POA: Diagnosis not present

## 2023-05-19 DIAGNOSIS — N179 Acute kidney failure, unspecified: Secondary | ICD-10-CM | POA: Diagnosis not present

## 2023-05-19 DIAGNOSIS — I361 Nonrheumatic tricuspid (valve) insufficiency: Secondary | ICD-10-CM | POA: Diagnosis not present

## 2023-05-19 DIAGNOSIS — Z7901 Long term (current) use of anticoagulants: Secondary | ICD-10-CM | POA: Diagnosis not present

## 2023-05-19 DIAGNOSIS — I5042 Chronic combined systolic (congestive) and diastolic (congestive) heart failure: Secondary | ICD-10-CM | POA: Diagnosis not present

## 2023-05-19 DIAGNOSIS — J449 Chronic obstructive pulmonary disease, unspecified: Secondary | ICD-10-CM | POA: Diagnosis not present

## 2023-05-19 DIAGNOSIS — Z8673 Personal history of transient ischemic attack (TIA), and cerebral infarction without residual deficits: Secondary | ICD-10-CM | POA: Diagnosis not present

## 2023-05-19 DIAGNOSIS — N390 Urinary tract infection, site not specified: Secondary | ICD-10-CM | POA: Diagnosis not present

## 2023-05-19 DIAGNOSIS — E871 Hypo-osmolality and hyponatremia: Secondary | ICD-10-CM | POA: Diagnosis not present

## 2023-05-19 DIAGNOSIS — F03918 Unspecified dementia, unspecified severity, with other behavioral disturbance: Secondary | ICD-10-CM | POA: Diagnosis not present

## 2023-05-19 DIAGNOSIS — I340 Nonrheumatic mitral (valve) insufficiency: Secondary | ICD-10-CM | POA: Diagnosis not present

## 2023-05-19 LAB — BASIC METABOLIC PANEL
Anion gap: 9 (ref 5–15)
BUN: 19 mg/dL (ref 8–23)
CO2: 27 mmol/L (ref 22–32)
Calcium: 9.1 mg/dL (ref 8.9–10.3)
Chloride: 97 mmol/L — ABNORMAL LOW (ref 98–111)
Creatinine, Ser: 0.77 mg/dL (ref 0.44–1.00)
GFR, Estimated: >60 mL/min (ref >60)
Glucose, Bld: 85 mg/dL (ref 70–99)
Potassium: 4.3 mmol/L (ref 3.5–5.1)
Sodium: 133 mmol/L — ABNORMAL LOW (ref 135–145)

## 2023-05-19 MED ORDER — RIVAROXABAN 10 MG PO TABS
20.0000 mg | ORAL_TABLET | Freq: Every day | ORAL | Status: DC
  Administered 2023-05-19 – 2023-05-24 (×6): 20 mg via ORAL
  Filled 2023-05-19 (×6): qty 2

## 2023-05-19 NOTE — Progress Notes (Signed)
MD is aware there are no tele boxes available at this time. Stated will address on day shift tomorrow.

## 2023-05-19 NOTE — TOC Progression Note (Addendum)
Transition of Care [facility]) - Progression Note    Patient Details  Name: Marisa Nunez MRN: [id number] Date of Birth: 07-28-1945  Transition of Care [facility]) CM/SW Contact  Marliss Coots, LCSW Phone Number: 05/19/2023, 9:50 AM  Clinical Narrative:     9:50 AM CSW received PASRR number today ([id number]).  12:14 PM Upon review, above PASRR number has expired. CSW cancelled original PASRR request and submitted a new one.  Expected Discharge Plan: Skilled Nursing Facility Barriers to Discharge: Continued Medical Work up, Awaiting State Approval Financial controller)  Expected Discharge Plan and Services In-house Referral: Clinical Social Work   Post Acute Care Choice: Skilled Nursing Facility Living arrangements for the past 2 months: Single Family Home                                       Social Determinants of Health (SDOH) Interventions SDOH Screenings   Food Insecurity: No Food Insecurity (05/15/2023)  Housing: Low Risk  (05/15/2023)  Transportation Needs: No Transportation Needs (05/15/2023)  Utilities: Not At Risk (05/15/2023)  Alcohol Screen: Low Risk  (01/28/2023)  Depression (PHQ2-9): Low Risk  (01/28/2023)  Financial Resource Strain: Low Risk  (01/28/2023)  Physical Activity: Sufficiently Active (01/28/2023)  Social Connections: Unknown (05/15/2023)  Stress: No Stress Concern Present (01/28/2023)  Tobacco Use: Medium Risk (05/14/2023)  Health Literacy: Adequate Health Literacy (01/28/2023)    Readmission Risk Interventions    11/28/2021   12:13 PM  Readmission Risk Prevention Plan  Transportation Screening Complete  PCP or Specialist Appt within 3-5 Days Complete  HRI or Home Care Consult Complete  Social Work Consult for Recovery Care Planning/Counseling Complete  Palliative Care Screening Not Applicable  Medication Review Oceanographer) Complete

## 2023-05-19 NOTE — Progress Notes (Signed)
    HD#0 Subjective:   Summary: Marisa Nunez is a 78 y.o. female with PMH of dementia with behavioral disturbances, atrial fibrillation on xarelto, HFrEF (LV EF 45-50%) who presents with disorientation and weakness and admitted for UTI.  Patient feeling well today, no new complaints. No pain, tolerating food well. She is willing to go to a SNF/memory care unit whenever one becomes available.   Objective:  Vital signs in last 24 hours: Vitals:   05/18/23 0833 05/18/23 1553 05/19/23 0414 05/19/23 0818  BP: (!) 134/93 96/70 114/72 111/73  Pulse: 82 76 78 80  Resp: 18 16 16 17   Temp:  97.6 F (36.4 C) 97.9 F (36.6 C) 98 F (36.7 C)  TempSrc:  Oral Oral Oral  SpO2: 100% 100% 98% 97%   Supplemental O2: Room Air  Physical Exam:  Constitutional: chronically ill-appearing female, low BMI Cardiovascular: regular rate Pulmonary/Chest: normal work of breathing on RA Neurological: alert & oriented Psych: pleasant mood and affect  Assessment/Plan:   #UTI Multiple species noted on culture. Afebrile with no leukocytosis. So far no growth on blood culture.  - Macrobid 100 mg BID, last day today  #Dementia with behavioral disturbances  #Anxiety  Per nursing, patient continues to have intermittent episodes of agitation but otherwise has had a more stable mood and slept well through the night.  - Continue citalopram, depakote, memantine, quetiapine (increased nightly dose), melatonin, buspar, adding prn olanzapine - Transfer to 2W (closed unit) and Personnel officer. Will attempt dispo to memory care unit  #Afib Per PT note, she had an episode of bradycardia to 30-40s, followed by tachycardia to 140s with dyspnea. Currently rate controlled. Continue metoprolol 50 mg daily. Xarelto increased to 20 mg daily. - Continue current regimen. Will get an EKG if she becomes dyspneic again  #Physical Deconditioning Lives with daughter. Noted decreased mobility. PT and OT evaluated. Pending SNF  placement. TOC consulted.  -Appreciate PT/OT assistance  -F/u TOC recs  Chronic Conditions: #Hyponatremia: Sodium stable, asymptomatic HFrEF (EF 45-50%) & HTN: Normotensive. Not volume overloaded on exam. Holding Lasix. Continue metoprolol  Severe tricuspid regurgitation & moderate to severe mitral valve regurgitation: Noted during recent hospitalization. Plan established with cardiology prior to discharge for no further inpatient work-up or treatment, and she has OP follow-up scheduled for 01/23. Chronic anemia: Hgb stable and at baseline. No signs of bleeding.   Diet: Normal IVF: None,None VTE: DOAC Code: DNR/DNI PT/OT recs: SNF TOC recs: consulted for SNF  Dispo: Anticipated discharge to Skilled nursing facility pending medical stability and placement  Marisa Fam, MD Internal Medicine Resident PGY-1 Pager: [phone] Please contact the on call pager after 5 pm and on weekends at [phone].

## 2023-05-19 NOTE — NC FL2 (Signed)
[place] MEDICAID FL2 LEVEL OF CARE FORM     IDENTIFICATION  Patient Name: Marisa Nunez Birthdate: [birth year] Sex: female Admission Date (Current Location): 05/14/2023  [facility] and [place] Number:  Producer, television/film/video and Address:  [facility], [address], [place], [place] [address]      Provider Number: [id number]  Attending Physician Name and Address:  Mercie Eon, MD  Relative Name and Phone Number:  Nelma Rothman; [phone]; Daughter    Current Level of Care: Hospital Recommended Level of Care: Skilled Nursing Facility Prior Approval Number:    Date Approved/Denied:   PASRR Number: [id number]  Discharge Plan: SNF    Current Diagnoses: Patient Active Problem List   Diagnosis Date Noted   Anxiety state 05/17/2023   UTI (urinary tract infection) 05/15/2023   Altered mental status 05/15/2023   Acute on chronic HFrEF (heart failure with reduced ejection fraction) (HCC) 05/08/2023   Pleural effusion on right 05/08/2023   Healthcare maintenance 01/28/2023   Dementia with behavioral disturbance (HCC) 12/26/2022   History of cerebral infarction 10/29/2020   History of right breast cancer 02/25/2018   Mixed hyperlipidemia 05/06/2017   Osteopenia of multiple sites 05/06/2017   Essential hypertension 10/25/2015   Persistent atrial fibrillation (HCC) 10/25/2015    Orientation RESPIRATION BLADDER Height & Weight     Self, Place, Situation  Normal (Room Air) Continent Weight:   Height:     BEHAVIORAL SYMPTOMS/MOOD NEUROLOGICAL BOWEL NUTRITION STATUS      Continent Diet (Please see dc summary)  AMBULATORY STATUS COMMUNICATION OF NEEDS Skin   Limited Assist Verbally Normal                       Personal Care Assistance Level of Assistance  Bathing, Feeding, Dressing Bathing Assistance: Limited assistance Feeding assistance: Limited assistance Dressing Assistance: Limited assistance     Functional Limitations Info              SPECIAL CARE FACTORS FREQUENCY  OT (By licensed OT), PT (By licensed PT)     PT Frequency: 5x OT Frequency: 5x            Contractures Contractures Info: Not present    Additional Factors Info  Code Status, Allergies Code Status Info: DNR- Limited- Do Not Intubate/DNI Allergies Info: Influenza Virus Vaccine; Pneumococcal Vaccine; Myrbetriq (mirabegron Er); Ativan (lorazepam)           Current Medications (05/19/2023):  This is the current hospital active medication list Current Facility-Administered Medications  Medication Dose Route Frequency Provider Last Rate Last Admin   acetaminophen (TYLENOL) tablet 650 mg  650 mg Oral Q6H PRN Champ Mungo, DO   650 mg at 05/17/23 1325   Or   acetaminophen (TYLENOL) suppository 650 mg  650 mg Rectal Q6H PRN Champ Mungo, DO       albuterol (PROVENTIL) (2.5 MG/3ML) 0.083% nebulizer solution 2.5 mg  2.5 mg Nebulization Q4H PRN Champ Mungo, DO   2.5 mg at 05/17/23 1832   benzonatate (TESSALON) capsule 100 mg  100 mg Oral TID PRN Champ Mungo, DO   100 mg at 05/17/23 1832   busPIRone (BUSPAR) tablet 5 mg  5 mg Oral BID Rana Snare, DO   5 mg at 05/19/23 0816   citalopram (CELEXA) tablet 20 mg  20 mg Oral BID Rana Snare, DO   20 mg at 05/19/23 0816   divalproex (DEPAKOTE SPRINKLE) capsule 250 mg  250 mg Oral BID  Champ Mungo, DO   250 mg at 05/19/23 0816   melatonin tablet 10 mg  10 mg Oral QHS Gwenevere Abbot, MD   10 mg at 05/18/23 1951   memantine (NAMENDA) tablet 20 mg  20 mg Oral BID Gwenevere Abbot, MD   20 mg at 05/19/23 0815   metoprolol succinate (TOPROL-XL) 24 hr tablet 50 mg  50 mg Oral Daily Champ Mungo, DO   50 mg at 05/19/23 9147   multivitamin with minerals tablet 1 tablet  1 tablet Oral Q breakfast Champ Mungo, DO   1 tablet at 05/19/23 0815   nitrofurantoin (macrocrystal-monohydrate) (MACROBID) capsule 100 mg  100 mg Oral Q12H Champ Mungo, DO   100 mg at 05/19/23 0816   OLANZapine (ZYPREXA) tablet 2.5 mg  2.5 mg Oral  Daily PRN Rana Snare, DO       ondansetron [facility]) injection 4 mg  4 mg Intravenous Q8H PRN Rana Snare, DO   4 mg at 05/16/23 1403   potassium chloride SA (KLOR-CON M) CR tablet 10 mEq  10 mEq Oral BID Champ Mungo, DO   10 mEq at 05/19/23 0816   QUEtiapine (SEROQUEL) tablet 50 mg  50 mg Oral Daily Rana Snare, DO   50 mg at 05/19/23 0815   QUEtiapine (SEROQUEL) tablet 75 mg  75 mg Oral QHS Rana Snare, DO   75 mg at 05/18/23 1951   Rivaroxaban (XARELTO) tablet 15 mg  15 mg Oral Q supper Champ Mungo, DO   15 mg at 05/18/23 1539     Discharge Medications: Please see discharge summary for a list of discharge medications.  Relevant Imaging Results:  Relevant Lab Results:   Additional Information SS# [id number]  Lillianna Sabel E [address] Schader, LCSW

## 2023-05-19 NOTE — Plan of Care (Signed)

## 2023-05-19 NOTE — TOC CM/SW Note (Signed)
RE: Marisa Nunez PASRR Date of Birth: [birth year] Date: 05/19/2023  Please be advised that the above-named patient will require a short-term nursing home stay - anticipated 30 days or less for rehabilitation and strengthening.  The plan is for return home.   Antonietta Breach, MSW, LCSW-A Transitions of Care  Clinical Social Worker I [phone]

## 2023-05-19 NOTE — Progress Notes (Signed)
Physical Therapy Treatment Patient Details Name: Marisa Nunez MRN: [id number] DOB: [birth year] Today's Date: 05/19/2023   History of Present Illness Pt is a 78 yo female presenting 1/16 with shuffled gait and AMS. CT head unremarkable, CXR without PNA. Found to be in afib with RVR; UA indicative of infection, hyponatremia, borderline AKI. Recently admitted 1/10 - 05/10/2023 for heart failure exacerbation. PMH of dementia, HFrEF (EF 45-50% 03/08/24) with new diagnosis of severe tricuspid regurgitation, HTN, HLD, AF on Xarelto    PT Comments  Pt seen for PT tx with pt agreeable. Pt reporting she wants to go home; pt with decreased awareness of deficits, decreased orientation, decreased short term memory but pt able to be redirected during session. Pt ambulates without AD x 2 trials with CGA with impaired gait pattern as noted below. During first gait trial pt noted feeling lightheaded, HR reading as low as 30s-40s bpm after gait & nurse made aware. After rest HR increased to 70s bpm & nurse cleared pt for further mobility. After 2nd gait trial max HR 142 bpm, decreased to 128 bpm & nurse made aware as well. Pt on room air with SpO2 >90%. Will continue to follow to acutely to address strengthening, balance, endurance. Pt would benefit from gait trial with RW but further mobility deferred today 2/2 labile HR.   If plan is discharge home, recommend the following: A little help with walking and/or transfers;A little help with bathing/dressing/bathroom;Assistance with cooking/housework;Direct supervision/assist for medications management;Direct supervision/assist for financial management;Assist for transportation;Help with stairs or ramp for entrance;Supervision due to cognitive status   Can travel by private vehicle     Yes  Equipment Recommendations  Rolling walker (2 wheels)    Recommendations for Other Services       Precautions / Restrictions Precautions Precautions: Fall Precaution Comments:  watch HR, O2 Restrictions Weight Bearing Restrictions Per Provider Order: No     Mobility  Bed Mobility               General bed mobility comments: not tested, pt received & left sitting in chair    Transfers Overall transfer level: Needs assistance Equipment used: None Transfers: Sit to/from Stand Sit to Stand: Supervision           General transfer comment: STS from recliner, standard chair, EOB with supervision    Ambulation/Gait Ambulation/Gait assistance: Contact guard assist Gait Distance (Feet): 130 Feet (+ 75 ft) Assistive device: None Gait Pattern/deviations: Decreased step length - right, Decreased step length - left, Decreased stride length, Decreased dorsiflexion - right, Decreased dorsiflexion - left Gait velocity: decreased     General Gait Details: decreased heel strike BLE, pt frequently reaching for rail in hallway for support with PT cuing pt not to do so. Pt reports she does not furniture walk at home (does not use AD at baseline) but is reaching for objects 2/2 BLE feeling weak.   Stairs             Wheelchair Mobility     Tilt Bed    Modified Rankin (Stroke Patients Only)       Balance Overall balance assessment: Needs assistance Sitting-balance support: Feet supported Sitting balance-Leahy Scale: Good Sitting balance - Comments: pt able to don tennis shoes while sitting EOB without LOB.   Standing balance support: No upper extremity supported, During functional activity Standing balance-Leahy Scale: Poor  Cognition Arousal: Alert Behavior During Therapy: Anxious, Restless Overall Cognitive Status: History of cognitive impairments - at baseline                                 General Comments: Pt restless, poor short term memory (asking PT's name immediately after asking it), wanting to go home, confused, able to be redirected during session.        Exercises       General Comments General comments (skin integrity, edema, etc.): Pt able to don tennis shoes sitting EOB but requires assistance for tying them.      Pertinent Vitals/Pain Pain Assessment Pain Assessment: No/denies pain    Home Living                          Prior Function            PT Goals (current goals can now be found in the care plan section) Acute Rehab PT Goals Patient Stated Goal: return home PT Goal Formulation: With patient/family Time For Goal Achievement: 05/30/23 Potential to Achieve Goals: Fair Progress towards PT goals: Progressing toward goals    Frequency    Min 1X/week      PT Plan      Co-evaluation              AM-PAC PT "6 Clicks" Mobility   Outcome Measure  Help needed turning from your back to your side while in a flat bed without using bedrails?: None Help needed moving from lying on your back to sitting on the side of a flat bed without using bedrails?: None Help needed moving to and from a bed to a chair (including a wheelchair)?: A Little Help needed standing up from a chair using your arms (e.g., wheelchair or bedside chair)?: A Little Help needed to walk in hospital room?: A Little Help needed climbing 3-5 steps with a railing? : A Little 6 Click Score: 20    End of Session   Activity Tolerance: Treatment limited secondary to medical complications (Comment) (limited 2/2 fluctuating HR readings during session) Patient left: in chair;with call bell/phone within reach;with nursing/sitter in room Nurse Communication:  (HR) PT Visit Diagnosis: Other abnormalities of gait and mobility (R26.89);Muscle weakness (generalized) (M62.81);Unsteadiness on feet (R26.81)     Time: 1610-9604 PT Time Calculation (min) (ACUTE ONLY): 16 min  Charges:    $Therapeutic Activity: 8-22 mins PT General Charges $$ ACUTE PT VISIT: 1 Visit                     Aleda Grana, PT, DPT 05/19/23, 11:52 AM   Sandi Mariscal 05/19/2023, 11:49 AM

## 2023-05-20 ENCOUNTER — Encounter: Payer: Self-pay | Admitting: *Deleted

## 2023-05-20 DIAGNOSIS — I110 Hypertensive heart disease with heart failure: Secondary | ICD-10-CM | POA: Diagnosis not present

## 2023-05-20 DIAGNOSIS — I340 Nonrheumatic mitral (valve) insufficiency: Secondary | ICD-10-CM | POA: Diagnosis not present

## 2023-05-20 DIAGNOSIS — Z7901 Long term (current) use of anticoagulants: Secondary | ICD-10-CM | POA: Diagnosis not present

## 2023-05-20 DIAGNOSIS — N179 Acute kidney failure, unspecified: Secondary | ICD-10-CM | POA: Diagnosis not present

## 2023-05-20 DIAGNOSIS — I4891 Unspecified atrial fibrillation: Secondary | ICD-10-CM | POA: Diagnosis not present

## 2023-05-20 DIAGNOSIS — Z853 Personal history of malignant neoplasm of breast: Secondary | ICD-10-CM | POA: Diagnosis not present

## 2023-05-20 DIAGNOSIS — I5042 Chronic combined systolic (congestive) and diastolic (congestive) heart failure: Secondary | ICD-10-CM | POA: Diagnosis not present

## 2023-05-20 DIAGNOSIS — E871 Hypo-osmolality and hyponatremia: Secondary | ICD-10-CM | POA: Diagnosis not present

## 2023-05-20 DIAGNOSIS — N3000 Acute cystitis without hematuria: Secondary | ICD-10-CM | POA: Diagnosis not present

## 2023-05-20 DIAGNOSIS — J449 Chronic obstructive pulmonary disease, unspecified: Secondary | ICD-10-CM | POA: Diagnosis not present

## 2023-05-20 DIAGNOSIS — Z8673 Personal history of transient ischemic attack (TIA), and cerebral infarction without residual deficits: Secondary | ICD-10-CM | POA: Diagnosis not present

## 2023-05-20 DIAGNOSIS — Z1152 Encounter for screening for COVID-19: Secondary | ICD-10-CM | POA: Diagnosis not present

## 2023-05-20 DIAGNOSIS — N390 Urinary tract infection, site not specified: Secondary | ICD-10-CM | POA: Diagnosis not present

## 2023-05-20 DIAGNOSIS — I361 Nonrheumatic tricuspid (valve) insufficiency: Secondary | ICD-10-CM | POA: Diagnosis not present

## 2023-05-20 DIAGNOSIS — F03918 Unspecified dementia, unspecified severity, with other behavioral disturbance: Secondary | ICD-10-CM | POA: Diagnosis not present

## 2023-05-20 LAB — CULTURE, BLOOD (ROUTINE X 2)
Culture: NO GROWTH
Culture: NO GROWTH
Special Requests: ADEQUATE

## 2023-05-20 NOTE — Plan of Care (Signed)

## 2023-05-20 NOTE — Progress Notes (Signed)
     Subjective:   Summary: Marisa Nunez is a 78 y.o. female with PMH of dementia with behavioral disturbances, atrial fibrillation on xarelto, HFrEF (LV EF 45-50%) who presents with disorientation and weakness and admitted for UTI.  Patient denies any changes today. Pleasant interview, no new complaints  Objective:  Vital signs in last 24 hours: Vitals:   05/19/23 0818 05/19/23 1414 05/19/23 1531 05/19/23 2015  BP: 111/73 (!) 110/55 115/85 116/65  Pulse: 80 80 (!) 108 69  Resp: 17 18  18   Temp: 98 F (36.7 C) 98 F (36.7 C)  98.4 F (36.9 C)  TempSrc: Oral Oral  Oral  SpO2: 97% 100% 98% 99%   Supplemental O2: Room Air  Physical Exam:  Constitutional: chronically ill-appearing female, low BMI Cardiovascular: regular rate Pulmonary/Chest: normal work of breathing on RA Neurological: alert & oriented Psych: pleasant mood and affect  Assessment/Plan:   #Dementia with behavioral disturbances  #Physical Deconditioning Per nursing, patient slept well overnight without any outbursts or agitation. It appears her resolved infection may have contributed to her previous agitation, and her adjusted medications are helping her remain calm. PT/OT recommending SNF for deconditioning and therapy needs.  - Continue citalopram, depakote, memantine, quetiapine (increased nightly dose), melatonin, buspar, adding prn olanzapine - Transferred to 2W (closed unit) and has not required a sitter. Appropriate for discharge to SNF  #Afib Per PT note, she had an episode of bradycardia to 30-40s, followed by tachycardia to 140s with dyspnea. Currently rate controlled. Continue metoprolol 50 mg daily. Xarelto increased to 20 mg daily. - On tele, showing rate controlled Afib. No changes to regimen  #UTI, resolved: Full 4 days of Macrobid completed, no further infectious concerns.   Chronic Conditions: #Hyponatremia: Sodium stable, asymptomatic HFrEF (EF 45-50%) & HTN: Normotensive. Not volume  overloaded on exam. Holding Lasix. Continue metoprolol  Severe tricuspid regurgitation & moderate to severe mitral valve regurgitation: Noted during recent hospitalization. Plan established with cardiology prior to discharge for no further inpatient work-up or treatment, and she has OP follow-up scheduled for 01/23. Chronic anemia: Hgb stable and at baseline. No signs of bleeding.   Diet: Normal IVF: None,None VTE: DOAC Code: DNR/DNI PT/OT recs: SNF TOC recs: consulted for SNF  Dispo: Anticipated discharge to Skilled nursing facility pending medical stability and placement  Monna Fam, MD Internal Medicine Resident PGY-1 Pager: [phone] Please contact the on call pager after 5 pm and on weekends at [phone].

## 2023-05-20 NOTE — TOC Progression Note (Addendum)
Transition of Care [facility]) - Progression Note    Patient Details  Name: Marisa Nunez MRN: [id number] Date of Birth: [birth year]  Transition of Care [facility]) CM/SW Contact  Nicolas Sisler A [place], [place] Phone Number: 05/20/2023, 4:23 PM  Clinical Narrative:     05/22/23 1136 CSW reached out to pt's daughter, [name], she stated that she chose [facility] for placement for SNF.   CSW reached out to Kia at [facility], bed available today or over the weekend.   CSW stared authorization request for insurance, status pending.  Auth IDBerkley Harvey [id number]   Weekend social work to reach out to [facility] regarding be availablity over weekend if authorization gets approved.      CSW contacted pt's daughter, [name], sent bed offers for placement. Contacted [facility] for review, waiting to hear back.    TOC will continue to follow.   Expected Discharge Plan: Skilled Nursing Facility Barriers to Discharge: Insurance Authorization  Expected Discharge Plan and Services In-house Referral: Clinical Social Work   Post Acute Care Choice: Skilled Nursing Facility Living arrangements for the past 2 months: Single Family Home                                       Social Determinants of Health (SDOH) Interventions SDOH Screenings   Food Insecurity: No Food Insecurity (05/15/2023)  Housing: Low Risk  (05/15/2023)  Transportation Needs: No Transportation Needs (05/15/2023)  Utilities: Not At Risk (05/15/2023)  Alcohol Screen: Low Risk  (01/28/2023)  Depression (PHQ2-9): Low Risk  (01/28/2023)  Financial Resource Strain: Low Risk  (01/28/2023)  Physical Activity: Sufficiently Active (01/28/2023)  Social Connections: Unknown (05/15/2023)  Stress: No Stress Concern Present (01/28/2023)  Tobacco Use: Medium Risk (05/14/2023)  Health Literacy: Adequate Health Literacy (01/28/2023)    Readmission Risk Interventions    11/28/2021   12:13 PM  Readmission Risk Prevention Plan  Transportation Screening  Complete  PCP or Specialist Appt within 3-5 Days Complete  HRI or Home Care Consult Complete  Social Work Consult for Recovery Care Planning/Counseling Complete  Palliative Care Screening Not Applicable  Medication Review Oceanographer) Complete

## 2023-05-20 NOTE — Progress Notes (Addendum)
Mobility Specialist: Progress Note   05/20/23 1153  Mobility  Activity Ambulated with assistance in hallway  Level of Assistance Contact guard assist, steadying assist  Assistive Device Front wheel walker  Distance Ambulated (ft) 320 ft  Activity Response Tolerated well  Mobility Referral Yes  Mobility visit 1 Mobility  Mobility Specialist Start Time (ACUTE ONLY) 1107  Mobility Specialist Stop Time (ACUTE ONLY) 1129  Mobility Specialist Time Calculation (min) (ACUTE ONLY) 22 min    Received pt on EOB having no complaints and agreeable to mobility. Pt was asymptomatic throughout ambulation and returned to room w/o fault. Left on EOB w/ call bell in reach and all needs met.  MS returned for additional session - received on EOB. CG throughout. No complaints. Returned to room without fault. Left on EOB with all needs met, call bell in reach.   Maurene Capes Mobility Specialist Please contact via SecureChat or Rehab office at [phone]

## 2023-05-20 NOTE — Progress Notes (Signed)
Occupational Therapy Treatment Patient Details Name: Marisa Nunez MRN: [id number] DOB: 1945/05/09 Today's Date: 05/20/2023   History of present illness Pt is a 78 yo female presenting 1/16 with shuffled gait and AMS. CT head unremarkable, CXR without PNA. Found to be in afib with RVR; UA indicative of infection, hyponatremia, borderline AKI. Recently admitted 1/10 - 05/10/2023 for heart failure exacerbation. PMH of dementia, HFrEF (EF 45-50% 03/08/24) with new diagnosis of severe tricuspid regurgitation, HTN, HLD, AF on Xarelto   OT comments  Pt. Seen for skilled OT treatment session. Pt. Able to complete bed mobility with S.  In room HHA for ambulation to b.room for toileting and standing grooming task with CGA/MIN A.  Progressing with with goals.  Cont. With acute OT POC.       If plan is discharge home, recommend the following:  A little help with walking and/or transfers;A little help with bathing/dressing/bathroom;Assistance with cooking/housework;Direct supervision/assist for medications management;Direct supervision/assist for financial management;Assist for transportation;Help with stairs or ramp for entrance;Supervision due to cognitive status   Equipment Recommendations  Other (comment)    Recommendations for Other Services      Precautions / Restrictions Precautions Precautions: Fall Precaution Comments: watch HR, O2 Restrictions Weight Bearing Restrictions Per Provider Order: No       Mobility Bed Mobility Overal bed mobility: Needs Assistance Bed Mobility: Supine to Sit, Sit to Supine     Supine to sit: Supervision Sit to supine: Supervision        Transfers Overall transfer level: Needs assistance Equipment used: None Transfers: Sit to/from Stand, Bed to chair/wheelchair/BSC Sit to Stand: Supervision           General transfer comment: CGA during ambulation     Balance                                           ADL either  performed or assessed with clinical judgement   ADL Overall ADL's : Needs assistance/impaired     Grooming: Wash/dry hands;Contact guard assist;Standing                   Toilet Transfer: Contact guard assist;Grab Paramedic Details (indicate cue type and reason): 1 person HHA Toileting- Clothing Manipulation and Hygiene: Set up;Sitting/lateral lean       Functional mobility during ADLs: Contact guard assist;Minimal assistance      Extremity/Trunk Assessment              Vision       Perception     Praxis      Cognition Arousal: Alert Behavior During Therapy: Restless Overall Cognitive Status: History of cognitive impairments - at baseline                                 General Comments: Pt restless, poor short term memory,  confused, able to be redirected during session.        Exercises      Shoulder Instructions       General Comments      Pertinent Vitals/ Pain       Pain Assessment Pain Assessment: No/denies pain  Home Living  Prior Functioning/Environment              Frequency  Min 1X/week        Progress Toward Goals  OT Goals(current goals can now be found in the care plan section)  Progress towards OT goals: Progressing toward goals     Plan      Co-evaluation                 AM-PAC OT "6 Clicks" Daily Activity     Outcome Measure   Help from another person eating meals?: None Help from another person taking care of personal grooming?: A Little Help from another person toileting, which includes using toliet, bedpan, or urinal?: A Little Help from another person bathing (including washing, rinsing, drying)?: A Little Help from another person to put on and taking off regular upper body clothing?: A Little Help from another person to put on and taking off regular lower body clothing?: A Little 6 Click Score:  19    End of Session    OT Visit Diagnosis: Unsteadiness on feet (R26.81);Muscle weakness (generalized) (M62.81);Other symptoms and signs involving cognitive function   Activity Tolerance Patient tolerated treatment well   Patient Left in bed;with call bell/phone within reach;with bed alarm set   Nurse Communication Other (comment) (rn states ok to work with pt., says ok to provide extra sugar packets for her coffee, alerted CNA pt. had moved to eob and was sitting eob to eat breakfast after set up in bed, alarm set)        Time: 1015-1030 OT Time Calculation (min): 15 min  Charges: OT General Charges $OT Visit: 1 Visit OT Treatments $Self Care/Home Management : 8-22 mins  Boneta Lucks, COTA/L Acute Rehabilitation [phone]   Alessandra Bevels Lorraine-COTA/L 05/20/2023, 11:37 AM

## 2023-05-21 ENCOUNTER — Ambulatory Visit: Payer: Medicare Other | Admitting: Internal Medicine

## 2023-05-21 ENCOUNTER — Encounter: Payer: Medicare Other | Admitting: Student

## 2023-05-21 DIAGNOSIS — Z8673 Personal history of transient ischemic attack (TIA), and cerebral infarction without residual deficits: Secondary | ICD-10-CM | POA: Diagnosis not present

## 2023-05-21 DIAGNOSIS — E871 Hypo-osmolality and hyponatremia: Secondary | ICD-10-CM | POA: Diagnosis not present

## 2023-05-21 DIAGNOSIS — Z853 Personal history of malignant neoplasm of breast: Secondary | ICD-10-CM | POA: Diagnosis not present

## 2023-05-21 DIAGNOSIS — I110 Hypertensive heart disease with heart failure: Secondary | ICD-10-CM | POA: Diagnosis not present

## 2023-05-21 DIAGNOSIS — I340 Nonrheumatic mitral (valve) insufficiency: Secondary | ICD-10-CM | POA: Diagnosis not present

## 2023-05-21 DIAGNOSIS — Z7901 Long term (current) use of anticoagulants: Secondary | ICD-10-CM | POA: Diagnosis not present

## 2023-05-21 DIAGNOSIS — Z1152 Encounter for screening for COVID-19: Secondary | ICD-10-CM | POA: Diagnosis not present

## 2023-05-21 DIAGNOSIS — I4891 Unspecified atrial fibrillation: Secondary | ICD-10-CM | POA: Diagnosis not present

## 2023-05-21 DIAGNOSIS — N3000 Acute cystitis without hematuria: Secondary | ICD-10-CM | POA: Diagnosis not present

## 2023-05-21 DIAGNOSIS — N179 Acute kidney failure, unspecified: Secondary | ICD-10-CM | POA: Diagnosis not present

## 2023-05-21 DIAGNOSIS — I361 Nonrheumatic tricuspid (valve) insufficiency: Secondary | ICD-10-CM | POA: Diagnosis not present

## 2023-05-21 DIAGNOSIS — N390 Urinary tract infection, site not specified: Secondary | ICD-10-CM | POA: Diagnosis not present

## 2023-05-21 DIAGNOSIS — J449 Chronic obstructive pulmonary disease, unspecified: Secondary | ICD-10-CM | POA: Diagnosis not present

## 2023-05-21 DIAGNOSIS — I5042 Chronic combined systolic (congestive) and diastolic (congestive) heart failure: Secondary | ICD-10-CM | POA: Diagnosis not present

## 2023-05-21 NOTE — TOC Progression Note (Addendum)
Transition of Care [facility]) - Progression Note    Patient Details  Name: Marisa Nunez MRN: [id number] Date of Birth: 1945/10/06  Transition of Care [facility]) CM/SW Contact  Iraida Cragin A [place], [place] Phone Number: 05/21/2023, 5:07 PM  Clinical Narrative:     Update 1444  Update- Pt's authorization went to Medical Review for consideration.  05/22/23 1136 CSW reached out to pt's daughter, [name], she stated that she chose [facility] for placement for SNF.    CSW reached out to Kia at [facility], bed available today or over the weekend.    CSW stared authorization request for insurance, status pending.  Auth IDBerkley Harvey [id number]    Weekend social work to reach out to [facility] regarding be availablity over weekend if authorization gets approved.   CSW reached out to pt's daughter [name] to get decision on bed offers. She stated she would look over the list again and reach out to CSW with decision. CSW has not received call back, will follow up again tomorrow.    TOC will continue to follow.   Expected Discharge Plan: Skilled Nursing Facility Barriers to Discharge: Continued Medical Work up, Awaiting State Approval Financial controller)  Expected Discharge Plan and Services In-house Referral: Clinical Social Work   Post Acute Care Choice: Skilled Nursing Facility Living arrangements for the past 2 months: Single Family Home                                       Social Determinants of Health (SDOH) Interventions SDOH Screenings   Food Insecurity: No Food Insecurity (05/15/2023)  Housing: Low Risk  (05/15/2023)  Transportation Needs: No Transportation Needs (05/15/2023)  Utilities: Not At Risk (05/15/2023)  Alcohol Screen: Low Risk  (01/28/2023)  Depression (PHQ2-9): Low Risk  (01/28/2023)  Financial Resource Strain: Low Risk  (01/28/2023)  Physical Activity: Sufficiently Active (01/28/2023)  Social Connections: Unknown (05/15/2023)  Stress: No Stress Concern Present (01/28/2023)  Tobacco Use:  Medium Risk (05/14/2023)  Health Literacy: Adequate Health Literacy (01/28/2023)    Readmission Risk Interventions    11/28/2021   12:13 PM  Readmission Risk Prevention Plan  Transportation Screening Complete  PCP or Specialist Appt within 3-5 Days Complete  HRI or Home Care Consult Complete  Social Work Consult for Recovery Care Planning/Counseling Complete  Palliative Care Screening Not Applicable  Medication Review Oceanographer) Complete

## 2023-05-21 NOTE — Progress Notes (Signed)
Mobility Specialist: Progress Note   05/21/23 1222  Mobility  Activity Ambulated with assistance in room  Level of Assistance Standby assist, set-up cues, supervision of patient - no hands on  Assistive Device None  Distance Ambulated (ft) 30 ft  Activity Response Tolerated well  Mobility Referral Yes  Mobility visit 1 Mobility  Mobility Specialist Start Time (ACUTE ONLY) 1125  Mobility Specialist Stop Time (ACUTE ONLY) 1134  Mobility Specialist Time Calculation (min) (ACUTE ONLY) 9 min    Pt was agreeable to mobility session - received in bed. Not feeling very well today and a little irritated but calmed down towards EOS. Needed to use the BR - ambulated SV without fault. Declined further hallway ambulation. Left in bed with all needs met, call bell in reach. Bed alarm on.   Maurene Capes Mobility Specialist Please contact via SecureChat or Rehab office at [phone]

## 2023-05-21 NOTE — Progress Notes (Signed)
     Subjective:   Summary: Marisa Nunez is a 78 y.o. female with PMH of dementia with behavioral disturbances, atrial fibrillation on xarelto, HFrEF (LV EF 45-50%) who presents with disorientation and weakness and admitted for UTI.  Patient denies any changes today. No new complaints.   Objective:  Vital signs in last 24 hours: Vitals:   05/20/23 2100 05/21/23 0420 05/21/23 0732 05/21/23 1406  BP: 112/78 125/80 (!) 141/80 (!) 126/59  Pulse: 96 71 65 66  Resp: 18 18 16 16   Temp: 98.2 F (36.8 C) 97.7 F (36.5 C) 97.6 F (36.4 C) 97.9 F (36.6 C)  TempSrc:      SpO2: 99% 99% 100% 98%   Supplemental O2: Room Air  Physical Exam:  Constitutional: chronically ill-appearing female, low BMI Cardiovascular: regular rate Pulmonary/Chest: normal work of breathing on RA Neurological: alert & oriented Psych: pleasant mood and affect  Assessment/Plan:   #Dementia with behavioral disturbances  #Physical Deconditioning Per nursing, patient slept well overnight without any outbursts or agitation. PT/OT recommending SNF for deconditioning and therapy needs.  - Continue citalopram, depakote, memantine, quetiapine (increased nightly dose), melatonin, buspar, adding prn olanzapine - Transferred to 2W (closed unit) and has not required a sitter. Appropriate for discharge to SNF  #Afib Currently rate controlled. Continue metoprolol 50 mg daily. Xarelto increased to 20 mg daily.  #UTI, resolved: Full 4 days of Macrobid completed, no further infectious concerns.   Chronic Conditions: #Hyponatremia: Sodium stable, asymptomatic HFrEF (EF 45-50%) & HTN: Normotensive. Not volume overloaded on exam. Holding Lasix. Continue metoprolol  Severe tricuspid regurgitation & moderate to severe mitral valve regurgitation: Noted during recent hospitalization. Plan established with cardiology prior to discharge for no further inpatient work-up or treatment, and she has OP follow-up scheduled for  01/23. Chronic anemia: Hgb stable and at baseline. No signs of bleeding.   Diet: Normal IVF: None,None VTE: DOAC Code: DNR/DNI PT/OT recs: SNF TOC recs: consulted for SNF  Dispo: Anticipated discharge to Skilled nursing facility pending medical stability and placement  Monna Fam, MD Internal Medicine Resident PGY-1 Pager: [phone] Please contact the on call pager after 5 pm and on weekends at [phone].

## 2023-05-21 NOTE — Progress Notes (Signed)
Physical Therapy Treatment Patient Details Name: Marisa Nunez MRN: [id number] DOB: 1945-06-27 Today's Date: 05/21/2023   History of Present Illness 78 y.o. female admitted 05/14/23 with shuffled gait and AMS. Workup for afib with RVR, UTI, AKI. CT head unremarkable. Of note, recent admit 05/08/23-05/10/23 for HF exacerbation. Other PMH includes dementia, HFrEF (EF 45-50% 03/08/24) with new dx severe tricuspid regurgitation, HTN, HLD, AF on Xarelto.   PT Comments  Pt focused on "not feeling well" this session, difficult to motivate activity progression beyond walking in room. Pt able to transfer and ambulate without DME at supervision-level; pt declined gait trial with rollator. Pt unable to expand upon symptoms other than not feeling well. progressing with mobility. Continue to recommend discharge to SNF with transition to ALF/memory care. Will follow acute to address established goals.      If plan is discharge home, recommend the following: A little help with bathing/dressing/bathroom;Assistance with cooking/housework;Direct supervision/assist for medications management;Direct supervision/assist for financial management;Supervision due to cognitive status;Assist for transportation   Can travel by private vehicle     Yes  Equipment Recommendations  Rolling walker (2 wheels)    Recommendations for Other Services       Precautions / Restrictions Precautions Precautions: Fall Restrictions Weight Bearing Restrictions Per Provider Order: No     Mobility  Bed Mobility Overal bed mobility: Modified Independent             General bed mobility comments: supine<>sit mod indep    Transfers Overall transfer level: Needs assistance Equipment used: None Transfers: Sit to/from Stand Sit to Stand: Supervision           General transfer comment: 3x sit<>stand from bed without DME, supervision due to impaired cognition    Ambulation/Gait Ambulation/Gait assistance:  Supervision Gait Distance (Feet): 36 Feet Assistive device: None Gait Pattern/deviations: Step-through pattern, Decreased stride length, Trunk flexed, Antalgic Gait velocity: Decreased     General Gait Details: slow, antalgic gait without DME, at times reaching to furniture for support; pt walking in room then laying self back down unwilling to mobilize further; pt declines to trial gait training with rollator that was brought to room to try   Stairs             Wheelchair Mobility     Tilt Bed    Modified Rankin (Stroke Patients Only)       Balance Overall balance assessment: Needs assistance Sitting-balance support: Feet supported Sitting balance-Leahy Scale: Good Sitting balance - Comments: prolonged sitting edge of bed with hands on knees, able to reposition self without assist; declines to don tennis shoes   Standing balance support: No upper extremity supported, During functional activity Standing balance-Leahy Scale: Fair                              Cognition Arousal: Alert Behavior During Therapy: Restless Overall Cognitive Status: History of cognitive impairments - at baseline                                 General Comments: h/o dementia. pt focused on "not feeling well" and "not wanting to move" requiring max encouragement to get out of bed; difficult to motivate. pt later states, "I don't mean to be rude"        Exercises      General Comments General comments (skin integrity, edema, etc.): pt repeats, "I  just don't feel well" but unable to specify further even when asked about specific symptoms. Spo2 99% on RA, HR 96. planned to trial rollator today though pt declines to use. noted untouched breakfast tray that had been set up for pt, pt reports not going to eat, will not specify why      Pertinent Vitals/Pain Pain Assessment Pain Assessment: Faces Faces Pain Scale: Hurts a little bit Pain Location: "I hurt" - will not  specify where Pain Descriptors / Indicators: Grimacing Pain Intervention(s): Monitored during session, Limited activity within patient's tolerance    Home Living                          Prior Function            PT Goals (current goals can now be found in the care plan section) Progress towards PT goals: Not progressing toward goals - comment (not feeling well, decreased desire to participate)    Frequency    Min 1X/week      PT Plan      Co-evaluation              AM-PAC PT "6 Clicks" Mobility   Outcome Measure  Help needed turning from your back to your side while in a flat bed without using bedrails?: None Help needed moving from lying on your back to sitting on the side of a flat bed without using bedrails?: None Help needed moving to and from a bed to a chair (including a wheelchair)?: A Little Help needed standing up from a chair using your arms (e.g., wheelchair or bedside chair)?: A Little Help needed to walk in hospital room?: A Little Help needed climbing 3-5 steps with a railing? : A Little 6 Click Score: 20    End of Session   Activity Tolerance: Patient limited by fatigue;Other (comment) (decreased desire to participate, cognition) Patient left: in bed;with call bell/phone within reach;with bed alarm set Nurse Communication: Mobility status PT Visit Diagnosis: Other abnormalities of gait and mobility (R26.89);Muscle weakness (generalized) (M62.81);Unsteadiness on feet (R26.81)     Time: 0981-1914 PT Time Calculation (min) (ACUTE ONLY): 10 min  Charges:    $Therapeutic Activity: 8-22 mins PT General Charges $$ ACUTE PT VISIT: 1 Visit                     Ina Homes, PT, DPT Acute Rehabilitation Services  Personal: Secure Chat Rehab Office: [phone]  Malachy Chamber 05/21/2023, 10:49 AM

## 2023-05-22 DIAGNOSIS — Z1152 Encounter for screening for COVID-19: Secondary | ICD-10-CM | POA: Diagnosis not present

## 2023-05-22 DIAGNOSIS — I5042 Chronic combined systolic (congestive) and diastolic (congestive) heart failure: Secondary | ICD-10-CM | POA: Diagnosis not present

## 2023-05-22 DIAGNOSIS — I340 Nonrheumatic mitral (valve) insufficiency: Secondary | ICD-10-CM | POA: Diagnosis not present

## 2023-05-22 DIAGNOSIS — E871 Hypo-osmolality and hyponatremia: Secondary | ICD-10-CM | POA: Diagnosis not present

## 2023-05-22 DIAGNOSIS — Z7901 Long term (current) use of anticoagulants: Secondary | ICD-10-CM | POA: Diagnosis not present

## 2023-05-22 DIAGNOSIS — Z853 Personal history of malignant neoplasm of breast: Secondary | ICD-10-CM | POA: Diagnosis not present

## 2023-05-22 DIAGNOSIS — N179 Acute kidney failure, unspecified: Secondary | ICD-10-CM | POA: Diagnosis not present

## 2023-05-22 DIAGNOSIS — Z8673 Personal history of transient ischemic attack (TIA), and cerebral infarction without residual deficits: Secondary | ICD-10-CM | POA: Diagnosis not present

## 2023-05-22 DIAGNOSIS — N3000 Acute cystitis without hematuria: Secondary | ICD-10-CM | POA: Diagnosis not present

## 2023-05-22 DIAGNOSIS — I4891 Unspecified atrial fibrillation: Secondary | ICD-10-CM | POA: Diagnosis not present

## 2023-05-22 DIAGNOSIS — J449 Chronic obstructive pulmonary disease, unspecified: Secondary | ICD-10-CM | POA: Diagnosis not present

## 2023-05-22 DIAGNOSIS — I361 Nonrheumatic tricuspid (valve) insufficiency: Secondary | ICD-10-CM | POA: Diagnosis not present

## 2023-05-22 DIAGNOSIS — I110 Hypertensive heart disease with heart failure: Secondary | ICD-10-CM | POA: Diagnosis not present

## 2023-05-22 DIAGNOSIS — N390 Urinary tract infection, site not specified: Secondary | ICD-10-CM | POA: Diagnosis not present

## 2023-05-22 LAB — GLUCOSE, CAPILLARY: Glucose-Capillary: 84 mg/dL (ref 70–99)

## 2023-05-22 NOTE — Progress Notes (Signed)
Mobility Specialist: Progress Note   05/22/23 1201  Mobility  Activity Ambulated with assistance in hallway  Level of Assistance Contact guard assist, steadying assist  Assistive Device None  Distance Ambulated (ft) 130 ft  Activity Response Tolerated well  Mobility Referral Yes  Mobility visit 1 Mobility  Mobility Specialist Start Time (ACUTE ONLY) 1042  Mobility Specialist Stop Time (ACUTE ONLY) 1052  Mobility Specialist Time Calculation (min) (ACUTE ONLY) 10 min    Pt was agreeable to mobility session - received on EOB. No complaints. CG throughout. Legs began to feel "wobbly" during ambulation and quickly needed to have a seat. Rolled back to her room. Left on EOB with all needs met, call bell in reach.   Maurene Capes Mobility Specialist Please contact via SecureChat or Rehab office at [phone]

## 2023-05-22 NOTE — Progress Notes (Signed)
     Subjective:   Summary: Marisa Nunez is a 78 y.o. female with PMH of dementia with behavioral disturbances, atrial fibrillation on xarelto, HFrEF (LV EF 45-50%) who presents with disorientation and weakness and admitted for UTI.  Patient denies any changes today. No new complaints.   Objective:  Vital signs in last 24 hours: Vitals:   05/21/23 1406 05/21/23 2043 05/22/23 0433 05/22/23 0859  BP: (!) 126/59 125/69 128/75 (!) 135/92  Pulse: 66 81 75 75  Resp: 16 18 18 18   Temp: 97.9 F (36.6 C) (!) 97.5 F (36.4 C) 98 F (36.7 C) 98.8 F (37.1 C)  TempSrc:  Oral Oral   SpO2: 98% 98% 98% 100%   Supplemental O2: Room Air  Physical Exam:  Constitutional: chronically ill-appearing female, low BMI Neurological: alert & oriented Psych: pleasant mood and affect  Assessment/Plan:   #Dementia with behavioral disturbances  #Physical Deconditioning PT/OT recommending SNF for deconditioning and therapy needs. Patient is medically stable for discharged. She has not needed a sitter for the past several days.  - Continue citalopram, depakote, memantine, quetiapine (increased nightly dose), melatonin, buspar, adding prn olanzapine  #Afib Currently rate controlled. Continue metoprolol 50 mg daily. Xarelto increased to 20 mg daily.  #UTI, resolved: Full 4 days of Macrobid completed, no further infectious concerns.   Chronic Conditions: #Hyponatremia: Sodium stable, asymptomatic HFrEF (EF 45-50%) & HTN: Normotensive. Not volume overloaded on exam. Holding Lasix. Continue metoprolol  Severe tricuspid regurgitation & moderate to severe mitral valve regurgitation: Noted during recent hospitalization. Plan established with cardiology prior to discharge for no further inpatient work-up or treatment, and she has OP follow-up scheduled for 01/23. Chronic anemia: Hgb stable and at baseline. No signs of bleeding.   Diet: Normal IVF: None,None VTE: DOAC Code: DNR/DNI PT/OT recs: SNF TOC  recs: consulted for SNF  Dispo: Anticipated discharge to Skilled nursing facility pending medical stability and placement  Monna Fam, MD Internal Medicine Resident PGY-1 Pager: [phone] Please contact the on call pager after 5 pm and on weekends at [phone].

## 2023-05-22 NOTE — Progress Notes (Signed)
Mobility Specialist: Progress Note   05/22/23 1549  Mobility  Activity Ambulated with assistance in hallway  Level of Assistance Standby assist, set-up cues, supervision of patient - no hands on  Assistive Device None  Distance Ambulated (ft) 400 ft  Activity Response Tolerated well  Mobility Referral Yes  Mobility visit 1 Mobility  Mobility Specialist Start Time (ACUTE ONLY) 1410  Mobility Specialist Stop Time (ACUTE ONLY) 1428  Mobility Specialist Time Calculation (min) (ACUTE ONLY) 18 min    Pt agreeable to mobility session - received sitting at nurse's station. Ambulated with SV throughout. Took 1x seated break d/t feeling tired and legs feeling weak. Returned to room without fault. Left on EOB with all needs met, call bell in reach.   Maurene Capes Mobility Specialist Please contact via SecureChat or Rehab office at [phone]

## 2023-05-22 NOTE — Plan of Care (Signed)

## 2023-05-23 DIAGNOSIS — N3000 Acute cystitis without hematuria: Secondary | ICD-10-CM | POA: Diagnosis not present

## 2023-05-23 DIAGNOSIS — Z853 Personal history of malignant neoplasm of breast: Secondary | ICD-10-CM | POA: Diagnosis not present

## 2023-05-23 DIAGNOSIS — N390 Urinary tract infection, site not specified: Secondary | ICD-10-CM | POA: Diagnosis not present

## 2023-05-23 DIAGNOSIS — I340 Nonrheumatic mitral (valve) insufficiency: Secondary | ICD-10-CM | POA: Diagnosis not present

## 2023-05-23 DIAGNOSIS — F03918 Unspecified dementia, unspecified severity, with other behavioral disturbance: Secondary | ICD-10-CM | POA: Diagnosis not present

## 2023-05-23 DIAGNOSIS — Z7901 Long term (current) use of anticoagulants: Secondary | ICD-10-CM | POA: Diagnosis not present

## 2023-05-23 DIAGNOSIS — I4891 Unspecified atrial fibrillation: Secondary | ICD-10-CM | POA: Diagnosis not present

## 2023-05-23 DIAGNOSIS — E871 Hypo-osmolality and hyponatremia: Secondary | ICD-10-CM | POA: Diagnosis not present

## 2023-05-23 DIAGNOSIS — Z1152 Encounter for screening for COVID-19: Secondary | ICD-10-CM | POA: Diagnosis not present

## 2023-05-23 DIAGNOSIS — F411 Generalized anxiety disorder: Secondary | ICD-10-CM | POA: Diagnosis not present

## 2023-05-23 DIAGNOSIS — J449 Chronic obstructive pulmonary disease, unspecified: Secondary | ICD-10-CM | POA: Diagnosis not present

## 2023-05-23 DIAGNOSIS — I361 Nonrheumatic tricuspid (valve) insufficiency: Secondary | ICD-10-CM | POA: Diagnosis not present

## 2023-05-23 DIAGNOSIS — Z8673 Personal history of transient ischemic attack (TIA), and cerebral infarction without residual deficits: Secondary | ICD-10-CM | POA: Diagnosis not present

## 2023-05-23 DIAGNOSIS — I5042 Chronic combined systolic (congestive) and diastolic (congestive) heart failure: Secondary | ICD-10-CM | POA: Diagnosis not present

## 2023-05-23 DIAGNOSIS — I110 Hypertensive heart disease with heart failure: Secondary | ICD-10-CM | POA: Diagnosis not present

## 2023-05-23 DIAGNOSIS — N179 Acute kidney failure, unspecified: Secondary | ICD-10-CM | POA: Diagnosis not present

## 2023-05-23 NOTE — TOC Progression Note (Signed)
Transition of Care [facility]) - Progression Note    Patient Details  Name: Marisa Nunez MRN: [id number] Date of Birth: 1945/10/27  Transition of Care [facility]) CM/SW Contact  Dellie Burns [place], [place] Phone Number: 05/23/2023, 2:33 PM  Clinical Narrative:  Per [facility], request for auth remains pending under medical review, no offer for peer-to-peer at this time. Will provide updates as available.   Dellie Burns, MSW, LCSW [phone] (coverage)      Expected Discharge Plan: Skilled Nursing Facility Barriers to Discharge: Continued Medical Work up, Awaiting State Approval Financial controller)  Expected Discharge Plan and Services In-house Referral: Clinical Social Work   Post Acute Care Choice: Skilled Nursing Facility Living arrangements for the past 2 months: Single Family Home                                       Social Determinants of Health (SDOH) Interventions SDOH Screenings   Food Insecurity: No Food Insecurity (05/15/2023)  Housing: Low Risk  (05/15/2023)  Transportation Needs: No Transportation Needs (05/15/2023)  Utilities: Not At Risk (05/15/2023)  Alcohol Screen: Low Risk  (01/28/2023)  Depression (PHQ2-9): Low Risk  (01/28/2023)  Financial Resource Strain: Low Risk  (01/28/2023)  Physical Activity: Sufficiently Active (01/28/2023)  Social Connections: Unknown (05/15/2023)  Stress: No Stress Concern Present (01/28/2023)  Tobacco Use: Medium Risk (05/14/2023)  Health Literacy: Adequate Health Literacy (01/28/2023)    Readmission Risk Interventions    11/28/2021   12:13 PM  Readmission Risk Prevention Plan  Transportation Screening Complete  PCP or Specialist Appt within 3-5 Days Complete  HRI or Home Care Consult Complete  Social Work Consult for Recovery Care Planning/Counseling Complete  Palliative Care Screening Not Applicable  Medication Review Oceanographer) Complete

## 2023-05-23 NOTE — Progress Notes (Signed)
     Subjective:   Summary: Marisa Nunez is a 78 y.o. female with PMH of dementia with behavioral disturbances, atrial fibrillation on xarelto, HFrEF (LV EF 45-50%) who presents with disorientation and weakness and admitted for UTI.  Patient denies any changes today. No new complaints. States she has been voiding and stooling normally with most recent bowel movement this morning.   Objective:  Vital signs in last 24 hours: Vitals:   05/22/23 2029 05/22/23 2043 05/23/23 0455 05/23/23 0749  BP: (!) 66/41 94/64 131/82 126/76  Pulse: 92 98 (!) 51 62  Resp: 17 20 15 18   Temp: 97.9 F (36.6 C) 98 F (36.7 C) 97.6 F (36.4 C) (!) 97.3 F (36.3 C)  TempSrc: Oral Oral  Oral  SpO2: 99% 99% 100% 99%   Supplemental O2: Room Air  Physical Exam:  Constitutional: chronically ill-appearing female, low BMI Neurological: alert & oriented Psych: pleasant mood and affect  Assessment/Plan:   #Dementia with behavioral disturbances  #Physical Deconditioning PT/OT recommending SNF for deconditioning and therapy needs. Patient is medically stable for discharg. She has not needed a sitter for the past several days.  - Continue citalopram, depakote, memantine, quetiapine (increased nightly dose), melatonin, buspar.   #Afib Currently rate controlled. Continue metoprolol 50 mg daily, Xarelto 20mg  daily.   #UTI, resolved: Full 4 days of Macrobid completed, no further infectious concerns.   Chronic Conditions: Hyponatremia: Sodium stable, asymptomatic HFrEF (EF 45-50%) & HTN: Normotensive. Not volume overloaded on exam. Holding Lasix. Continue metoprolol  Severe tricuspid regurgitation & moderate to severe mitral valve regurgitation: Noted during recent hospitalization. Plan established with cardiology prior to discharge for no further inpatient work-up or treatment, and she has OP follow-up scheduled. Chronic anemia: Hgb stable and at baseline. No signs of bleeding.   Diet: Normal IVF:  None,None VTE: DOAC Code: DNR/DNI PT/OT recs: SNF  Dispo: Anticipated discharge to Skilled nursing facility pending medical stability and placement  Monna Fam, MD Internal Medicine Resident PGY-1 Pager: [phone] Please contact the on call pager after 5 pm and on weekends at [phone].

## 2023-05-23 NOTE — Plan of Care (Signed)

## 2023-05-24 DIAGNOSIS — I509 Heart failure, unspecified: Secondary | ICD-10-CM | POA: Diagnosis not present

## 2023-05-24 DIAGNOSIS — Z887 Allergy status to serum and vaccine status: Secondary | ICD-10-CM | POA: Diagnosis not present

## 2023-05-24 DIAGNOSIS — N179 Acute kidney failure, unspecified: Secondary | ICD-10-CM | POA: Diagnosis not present

## 2023-05-24 DIAGNOSIS — I081 Rheumatic disorders of both mitral and tricuspid valves: Secondary | ICD-10-CM | POA: Diagnosis not present

## 2023-05-24 DIAGNOSIS — Z96611 Presence of right artificial shoulder joint: Secondary | ICD-10-CM | POA: Diagnosis not present

## 2023-05-24 DIAGNOSIS — Z8673 Personal history of transient ischemic attack (TIA), and cerebral infarction without residual deficits: Secondary | ICD-10-CM | POA: Diagnosis not present

## 2023-05-24 DIAGNOSIS — I110 Hypertensive heart disease with heart failure: Secondary | ICD-10-CM | POA: Diagnosis not present

## 2023-05-24 DIAGNOSIS — I959 Hypotension, unspecified: Secondary | ICD-10-CM | POA: Diagnosis not present

## 2023-05-24 DIAGNOSIS — E871 Hypo-osmolality and hyponatremia: Secondary | ICD-10-CM | POA: Diagnosis not present

## 2023-05-24 DIAGNOSIS — J449 Chronic obstructive pulmonary disease, unspecified: Secondary | ICD-10-CM | POA: Diagnosis not present

## 2023-05-24 DIAGNOSIS — N3000 Acute cystitis without hematuria: Secondary | ICD-10-CM | POA: Diagnosis not present

## 2023-05-24 DIAGNOSIS — F17210 Nicotine dependence, cigarettes, uncomplicated: Secondary | ICD-10-CM | POA: Diagnosis not present

## 2023-05-24 DIAGNOSIS — Z681 Body mass index (BMI) 19 or less, adult: Secondary | ICD-10-CM | POA: Diagnosis not present

## 2023-05-24 DIAGNOSIS — I429 Cardiomyopathy, unspecified: Secondary | ICD-10-CM | POA: Diagnosis not present

## 2023-05-24 DIAGNOSIS — Z87440 Personal history of urinary (tract) infections: Secondary | ICD-10-CM | POA: Diagnosis not present

## 2023-05-24 DIAGNOSIS — Z515 Encounter for palliative care: Secondary | ICD-10-CM | POA: Diagnosis not present

## 2023-05-24 DIAGNOSIS — Z7901 Long term (current) use of anticoagulants: Secondary | ICD-10-CM | POA: Diagnosis not present

## 2023-05-24 DIAGNOSIS — I700 Atherosclerosis of aorta: Secondary | ICD-10-CM | POA: Diagnosis not present

## 2023-05-24 DIAGNOSIS — R64 Cachexia: Secondary | ICD-10-CM | POA: Diagnosis not present

## 2023-05-24 DIAGNOSIS — Z66 Do not resuscitate: Secondary | ICD-10-CM | POA: Diagnosis not present

## 2023-05-24 DIAGNOSIS — R0602 Shortness of breath: Secondary | ICD-10-CM | POA: Diagnosis not present

## 2023-05-24 DIAGNOSIS — I4819 Other persistent atrial fibrillation: Secondary | ICD-10-CM | POA: Diagnosis not present

## 2023-05-24 DIAGNOSIS — Z79899 Other long term (current) drug therapy: Secondary | ICD-10-CM | POA: Diagnosis not present

## 2023-05-24 DIAGNOSIS — N390 Urinary tract infection, site not specified: Secondary | ICD-10-CM | POA: Diagnosis not present

## 2023-05-24 DIAGNOSIS — I5043 Acute on chronic combined systolic (congestive) and diastolic (congestive) heart failure: Secondary | ICD-10-CM | POA: Diagnosis not present

## 2023-05-24 DIAGNOSIS — Z853 Personal history of malignant neoplasm of breast: Secondary | ICD-10-CM | POA: Diagnosis not present

## 2023-05-24 MED ORDER — MEMANTINE HCL 10 MG PO TABS
10.0000 mg | ORAL_TABLET | Freq: Two times a day (BID) | ORAL | Status: DC
  Administered 2023-05-24 – 2023-05-25 (×2): 10 mg via ORAL
  Filled 2023-05-24 (×2): qty 1

## 2023-05-24 MED ORDER — CITALOPRAM HYDROBROMIDE 20 MG PO TABS
20.0000 mg | ORAL_TABLET | Freq: Every day | ORAL | Status: DC
  Administered 2023-05-25: 20 mg via ORAL
  Filled 2023-05-24: qty 1

## 2023-05-24 NOTE — Progress Notes (Signed)
     Subjective:   Summary: Marisa Nunez is a 78 y.o. female with PMH of dementia with behavioral disturbances, atrial fibrillation on xarelto, HFrEF (LV EF 45-50%) who presents with disorientation and weakness and admitted for UTI.  Patient evaluated at bedside. Denies any new concerns. No new pains. Ate some of her breakfast.   Objective:  Vital signs in last 24 hours: Vitals:   05/22/23 2043 05/23/23 0455 05/23/23 0749 05/24/23 0326  BP: 94/64 131/82 126/76 118/61  Pulse: 98 (!) 51 62 (!) 58  Resp: 20 15 18 18   Temp: 98 F (36.7 C) 97.6 F (36.4 C) (!) 97.3 F (36.3 C) 98.6 F (37 C)  TempSrc: Oral  Oral   SpO2: 99% 100% 99% 100%   Supplemental O2: Room Air  Physical Exam:  Constitutional: chronically ill-appearing female, low BMI Neurological: alert & oriented to self  Psych: pleasant mood and affect   Assessment/Plan:   #Dementia with behavioral disturbances  #Physical Deconditioning PT/OT recommending SNF for deconditioning and therapy needs. Patient is medically stable for discharge. She has not needed a sitter or PRN meds for the past several days. -Continue citalopram 20 mg daily, depakote 250 mg BID, memantine 10 mg BID, Seroquel 50 mg in AM and 75 mg PM, buspar 5 mg BID  -Pending SNF placement   #Afib on anticoagulation Remains rate controlled. Continue metoprolol 50 mg daily, Xarelto 20mg  daily.   #UTI, resolved: Full 4 days of Macrobid completed, no further infectious concerns.   Chronic Conditions: Hyponatremia: Sodium stable, asymptomatic HFrEF (EF 45-50%) & HTN: Normotensive. Not volume overloaded on exam. Holding Lasix and potassium. Continue metoprolol  Severe tricuspid regurgitation & moderate to severe mitral valve regurgitation: Noted during recent hospitalization. Plan established with cardiology prior to discharge for no further inpatient work-up or treatment, and she has OP follow-up scheduled. Chronic anemia: Hgb stable and at baseline. No  signs of bleeding. Plan weekly lab checks.  Diet: Normal IVF: None,None VTE: DOAC Code: DNR/DNI PT/OT recs: SNF  Dispo: Anticipated discharge to Skilled nursing facility pending placement  Rana Snare, DO Internal Medicine Resident PGY-2 Pager: [phone] Please contact the on-call pager after 5 pm and on weekends at [phone].

## 2023-05-24 NOTE — TOC Progression Note (Signed)
Transition of Care [facility]) - Progression Note    Patient Details  Name: Marisa Nunez MRN: [id number] Date of Birth: 1945/05/10  Transition [facility] Care [facility]) CM/SW Contact  Dellie Burns [place], [place] Phone Number: 05/24/2023, 1:19 PM  Clinical Narrative: spoke to [facility] rep who reports pt's request for SNF auth is still pending medical review, no provider has been assigned for review at this time. They are not requesting any additional clinicals or peer-to-peer at this time. MD updated.   Dellie Burns, MSW, LCSW [phone] (coverage)        Expected Discharge Plan: Skilled Nursing Facility Barriers to Discharge: Continued Medical Work up, Awaiting State Approval Financial controller)  Expected Discharge Plan and Services In-house Referral: Clinical Social Work   Post Acute Care Choice: Skilled Nursing Facility Living arrangements for the past 2 months: Single Family Home                                       Social Determinants of Health (SDOH) Interventions SDOH Screenings   Food Insecurity: No Food Insecurity (05/15/2023)  Housing: Low Risk  (05/15/2023)  Transportation Needs: No Transportation Needs (05/15/2023)  Utilities: Not At Risk (05/15/2023)  Alcohol Screen: Low Risk  (01/28/2023)  Depression (PHQ2-9): Low Risk  (01/28/2023)  Financial Resource Strain: Low Risk  (01/28/2023)  Physical Activity: Sufficiently Active (01/28/2023)  Social Connections: Unknown (05/15/2023)  Stress: No Stress Concern Present (01/28/2023)  Tobacco Use: Medium Risk (05/14/2023)  Health Literacy: Adequate Health Literacy (01/28/2023)    Readmission Risk Interventions    11/28/2021   12:13 PM  Readmission Risk Prevention Plan  Transportation Screening Complete  PCP or Specialist Appt within 3-5 Days Complete  HRI or Home Care Consult Complete  Social Work Consult for Recovery Care Planning/Counseling Complete  Palliative Care Screening Not Applicable  Medication Review Furniture conservator/restorer) Complete

## 2023-05-24 NOTE — Plan of Care (Signed)

## 2023-05-25 ENCOUNTER — Other Ambulatory Visit (HOSPITAL_COMMUNITY): Payer: Self-pay

## 2023-05-25 DIAGNOSIS — N3000 Acute cystitis without hematuria: Secondary | ICD-10-CM | POA: Diagnosis not present

## 2023-05-25 LAB — BASIC METABOLIC PANEL
Anion gap: 10 (ref 5–15)
BUN: 23 mg/dL (ref 8–23)
CO2: 23 mmol/L (ref 22–32)
Calcium: 8.7 mg/dL — ABNORMAL LOW (ref 8.9–10.3)
Chloride: 98 mmol/L (ref 98–111)
Creatinine, Ser: 0.73 mg/dL (ref 0.44–1.00)
GFR, Estimated: >60 mL/min (ref >60)
Glucose, Bld: 90 mg/dL (ref 70–99)
Potassium: 4.2 mmol/L (ref 3.5–5.1)
Sodium: 131 mmol/L — ABNORMAL LOW (ref 135–145)

## 2023-05-25 LAB — CBC
HCT: 36.9 % (ref 36.0–46.0)
Hemoglobin: 12 g/dL (ref 12.0–15.0)
MCH: 27.2 pg (ref 26.0–34.0)
MCHC: 32.5 g/dL (ref 30.0–36.0)
MCV: 83.7 fL (ref 80.0–100.0)
Platelets: 333 10*3/uL (ref 150–400)
RBC: 4.41 MIL/uL (ref 3.87–5.11)
RDW: 14.8 % (ref 11.5–15.5)
WBC: 9.4 10*3/uL (ref 4.0–10.5)
nRBC: 0 % (ref 0.0–0.2)

## 2023-05-25 MED ORDER — QUETIAPINE FUMARATE 50 MG PO TABS: 50.0000 mg | ORAL_TABLET | Freq: Every morning | ORAL | Status: DC

## 2023-05-25 MED ORDER — MEMANTINE HCL 10 MG PO TABS: 10.0000 mg | ORAL_TABLET | Freq: Two times a day (BID) | ORAL | Status: AC

## 2023-05-25 MED ORDER — QUETIAPINE FUMARATE 50 MG PO TABS: 75.0000 mg | ORAL_TABLET | Freq: Every day | ORAL | Status: DC

## 2023-05-25 MED ORDER — CITALOPRAM HYDROBROMIDE 20 MG PO TABS: 20.0000 mg | ORAL_TABLET | Freq: Every day | ORAL | Status: DC

## 2023-05-25 MED ORDER — BUSPIRONE HCL 5 MG PO TABS
5.0000 mg | ORAL_TABLET | Freq: Two times a day (BID) | ORAL | 0 refills | Status: AC
  Filled 2023-05-25: qty 60, 30d supply, fill #0

## 2023-05-25 NOTE — Plan of Care (Signed)

## 2023-05-25 NOTE — Progress Notes (Signed)
Mobility Specialist: Progress Note   05/25/23 1237  Mobility  Activity Ambulated with assistance in hallway  Level of Assistance Standby assist, set-up cues, supervision of patient - no hands on  Assistive Device None  Distance Ambulated (ft) 400 ft  Activity Response Tolerated well  Mobility Referral Yes  Mobility visit 1 Mobility  Mobility Specialist Start Time (ACUTE ONLY) 1032  Mobility Specialist Stop Time (ACUTE ONLY) 1042  Mobility Specialist Time Calculation (min) (ACUTE ONLY) 10 min    Received pt in bed having no complaints and agreeable to mobility. Pt was asymptomatic throughout ambulation and returned to room w/o fault. Left in bed w/ call bell in reach and all needs met.   Maurene Capes Mobility Specialist Please contact via SecureChat or Rehab office at [phone]

## 2023-05-25 NOTE — Progress Notes (Signed)
     Subjective:   Summary: Marisa Nunez is a 78 y.o. female with PMH of dementia with behavioral disturbances, atrial fibrillation on xarelto, HFrEF (LV EF 45-50%) who presents with disorientation and weakness and admitted for UTI.  Patient evaluated at bedside. Denies any new concerns. No new pains.  Objective:  Vital signs in last 24 hours: Vitals:   05/24/23 2233 05/25/23 0417 05/25/23 0528 05/25/23 0849  BP: (!) 89/55 (!) 108/55 109/67 (!) 143/81  Pulse: 76 71 68 71  Resp: 19 18 14 18   Temp: 98 F (36.7 C) 98 F (36.7 C) 98.3 F (36.8 C) 97.7 F (36.5 C)  TempSrc: Oral Oral    SpO2: 100% 100% 98% 98%   Supplemental O2: Room Air  Physical Exam:  Constitutional: chronically ill-appearing female, low BMI Neurological: alert & oriented to self  Psych: pleasant mood and affect   Assessment/Plan:   #Dementia with behavioral disturbances  #Physical Deconditioning PT/OT recommending SNF for deconditioning and therapy needs. Patient is medically stable for discharge. She has not needed a sitter or PRN meds for the past several days. -Continue citalopram 20 mg daily, depakote 250 mg BID, memantine 10 mg BID, Seroquel 50 mg in AM and 75 mg PM, buspar 5 mg BID  -Pending SNF placement vs home with HH  #Afib on anticoagulation Remains rate controlled. Continue metoprolol 50 mg daily, Xarelto 20mg  daily.   #UTI, resolved: Full 4 days of Macrobid completed, no further infectious concerns.   Chronic Conditions: Hyponatremia: Sodium stable, asymptomatic HFrEF (EF 45-50%) & HTN: Normotensive. Not volume overloaded on exam. Holding Lasix and potassium. Continue metoprolol  Severe tricuspid regurgitation & moderate to severe mitral valve regurgitation: Noted during recent hospitalization. Plan established with cardiology prior to discharge for no further inpatient work-up or treatment, and she has OP follow-up scheduled. Chronic anemia: Hgb stable and at baseline. No signs of  bleeding. Plan weekly lab checks.  Diet: Normal IVF: None,None VTE: DOAC Code: DNR/DNI PT/OT recs: SNF  Dispo: Anticipated discharge to Skilled nursing facility pending placement  Monna Fam, MD Internal Medicine Resident PGY-1 Pager: [phone] Please contact the on-call pager after 5 pm and on weekends at [phone].

## 2023-05-25 NOTE — TOC Transition Note (Signed)
Transition of Care [facility]) - Discharge Note   Patient Details  Name: Marisa Nunez MRN: [id number] Date of Birth: July 16, 1945  Transition of Care [facility]) CM/SW Contact:  Janae Bridgeman, RN Phone Number: 05/25/2023, 3:25 PM   Clinical Narrative:    CM met with the patient at the bedside but the patient was unable to participate in discharge planning needs for home.  I called the patient's daughter and discussed needs for home and she states that home health will not be needed since she states that patient was active with [facility] in the past and this was not helpful.  [name] was offered Medicare choice regarding DME company and she did not have a preference.  I called [employer] and requested delivery of RW to the bedside prior to discharge.  RW DME ordered and [employer] called to deliver to bedside.  I offered to the patient's daughter - referral to [facility] and daughter was provided with number to follow up to assist with family care or nursing home support.  Patient will return to home with the daughter, daughter's spouse and grandson today in the meantime.  Bedside nursing was updated that RW was ordered to be delivered to bedside and patient is able to discharge home with family.  I called [employer] and provided Carmelia Bake, with patient's daughter's contact information.     Barriers to Discharge: Continued Medical Work up, Engineer, mining)   Patient Goals and CMS Choice            Discharge Placement                       Discharge Plan and Services Additional resources added to the After Visit Summary for   In-house Referral: Clinical Social Work   Post Acute Care Choice: Skilled Nursing Facility                               Social Drivers of Health (SDOH) Interventions SDOH Screenings   Food Insecurity: No Food Insecurity (05/15/2023)  Housing: Low Risk  (05/15/2023)  Transportation Needs: No Transportation  Needs (05/15/2023)  Utilities: Not At Risk (05/15/2023)  Alcohol Screen: Low Risk  (01/28/2023)  Depression (PHQ2-9): Low Risk  (01/28/2023)  Financial Resource Strain: Low Risk  (01/28/2023)  Physical Activity: Sufficiently Active (01/28/2023)  Social Connections: Unknown (05/15/2023)  Stress: No Stress Concern Present (01/28/2023)  Tobacco Use: Medium Risk (05/14/2023)  Health Literacy: Adequate Health Literacy (01/28/2023)     Readmission Risk Interventions    11/28/2021   12:13 PM  Readmission Risk Prevention Plan  Transportation Screening Complete  PCP or Specialist Appt within 3-5 Days Complete  HRI or Home Care Consult Complete  Social Work Consult for Recovery Care Planning/Counseling Complete  Palliative Care Screening Not Applicable  Medication Review Oceanographer) Complete

## 2023-05-25 NOTE — Discharge Summary (Addendum)
Name: Marisa Nunez MRN: [id number] DOB: 1945-07-07 78 y.o. PCP: Mercie Eon, MD  Date of Admission: 05/14/2023  8:08 PM Date of Discharge: 05/25/23 Attending Physician: Dr. Antony Contras  Discharge Diagnosis: Principal Problem:   UTI (urinary tract infection) Active Problems:   Dementia with behavioral disturbance (HCC)   Altered mental status   Anxiety state    Discharge Medications: Allergies as of 05/25/2023       Reactions   Influenza Virus Vaccine Hives   Myrbetriq [mirabegron Er] Other (See Comments)   Confusion, agitation, hallucinations, psychosis   Pneumococcal Vaccine Hives   Ativan [lorazepam] Other (See Comments)   "Made the patient agitated,"  per daughter         Medication List     PAUSE taking these medications    furosemide 20 MG tablet Wait to take this until your doctor or other care provider tells you to start again. Commonly known as: LASIX Take 2 tablets (40 mg total) by mouth daily.       STOP taking these medications    potassium chloride 10 MEQ tablet Commonly known as: KLOR-CON M   sertraline 50 MG tablet Commonly known as: ZOLOFT       TAKE these medications    acetaminophen 500 MG tablet Commonly known as: TYLENOL Take 1 tablet (500 mg total) by mouth every 6 (six) hours as needed for moderate pain, fever or mild pain.   albuterol (2.5 MG/3ML) 0.083% nebulizer solution Commonly known as: PROVENTIL Take 3 mLs (2.5 mg total) by nebulization every 4 (four) hours as needed for shortness of breath or wheezing.   albuterol 108 (90 Base) MCG/ACT inhaler Commonly known as: VENTOLIN HFA Inhale 1-2 puffs into the lungs every 6 (six) hours as needed.   benzonatate 100 MG capsule Commonly known as: TESSALON Take 1 capsule (100 mg total) by mouth every 8 (eight) hours. What changed:  when to take this reasons to take this   busPIRone 5 MG tablet Commonly known as: BUSPAR Take 1 tablet (5 mg total) by mouth 2 (two) times  daily.   citalopram 20 MG tablet Commonly known as: CeleXA Take 1 tablet (20 mg total) by mouth daily. Start taking on: May 26, 2023 What changed: when to take this   divalproex 125 MG capsule Commonly known as: DEPAKOTE SPRINKLE Take 2 capsules (250 mg total) by mouth 2 (two) times daily.   Melatonin 10 MG Tabs Take 10 mg by mouth at bedtime.   memantine 10 MG tablet Commonly known as: NAMENDA Take 1 tablet (10 mg total) by mouth 2 (two) times daily. What changed: how much to take   metoprolol succinate 50 MG 24 hr tablet Commonly known as: TOPROL-XL Take 1 tablet (50 mg total) by mouth daily. Take with or immediately following a meal.   multivitamin with minerals tablet Take 1 tablet by mouth daily with breakfast.   ondansetron 4 MG disintegrating tablet Commonly known as: ZOFRAN-ODT Take 4 mg by mouth every 8 (eight) hours as needed for vomiting or nausea (dissolve orally).   QUEtiapine 50 MG tablet Commonly known as: SEROQUEL Take 1 tablet (50 mg total) by mouth in the morning. What changed: when to take this   QUEtiapine 50 MG tablet Commonly known as: SEROQUEL Take 1.5 tablets (75 mg total) by mouth at bedtime. What changed: You were already taking a medication with the same name, and this prescription was added. Make sure you understand how and when to take each.  rivaroxaban 20 MG Tabs tablet Commonly known as: XARELTO Take 1 tablet (20 mg total) by mouth daily with supper. Do not take with Eliquis        Disposition and follow-up:   Ms.Ziomara W Qian was discharged from [facility] in Stable condition.  At the hospital follow up visit please address:  1.  Follow-up:  - Dementia with behavioral disturbances: multiple medication changes made at discharge, ensure patient/daughter understand current regimen   - HFrEF: Lasix was not given during admission and was paused at discharge. Can restart if indicated   - Severe tricuspid  regurgitation: had a cardiology appointment scheduled but was hospitalized and could not attend. Information included in discharge instructions to reschedule appointment  2.  Labs / imaging needed at time of follow-up: None  3.  Pending labs/ test needing follow-up: None  4.  Medication Changes  Started: Buspar 5mg  BID  Stopped: Furosemide 20mg , Sertraline 50mg   Changed: Citalopram 20mg  once daily, memantine 10mg  BID, Seroquel 50mg  in the am and 75mg  in the pm   Follow-up Appointments:  Follow-up Information     Mercie Eon, MD.   Specialty: Internal Medicine Contact information: [address] [place], [address] [place] [address] [phone]                 Hospital Course by problem list:  UTI Patient presented to the ED with disorientation and bizarre behavior at home, found to have a UTI. In the ED she was noted to be in atrial fibrillation with RVR, and with a leukocytosis to 14.4. She was started on ceftriaxone, then transitioned to cefadroxil to complete a 4 day course. Patient remained afebrile, and her mental status improved with resolution of her UTI.   Dementia Family notes noticeable progression of patient's dementia over the last year. Extensive home regimen was continued during admission with increase in seroquel and avoidance of anticholinergic agents, with stabilization of patient's dementia with behavioral disturbances.   Hyponatremia Na initially 130, which improved with po intake. Asymptomatic during admission  AKI Cr at admission 1.08 from baseline 0.6-0.8. Mild AKI resolved with po intake.   Atrial fibrillation Heart rate remained rate controlled during admission after initial stabilization. Home beta blocker continued during admission.  Severe tricuspid regurgitation Moderate to severe mitral valve regurgitation Noted during recent hospitalization. Plan established with cardiology for outpatient follow up.   HFrEF Patient remained euvolemic on  exam. Diuresis held, beta blocker continued.    Discharge Subjective: Patient feeling very well this morning. No new complaints. She is ready to go to a SNF or home whenever a final decision can be made.   Discharge Exam:   BP (!) 143/81 (BP Location: Left Arm)   Pulse 71   Temp 97.7 F (36.5 C)   Resp 18   LMP  (LMP Unknown)   SpO2 98%  Constitutional: elderly female, low BMI, in no acute distress Cardiovascular: regular rate and rhythm, no m/r/g Pulmonary/Chest: normal work of breathing on room air, lungs clear to auscultation bilaterally Abdominal: soft, non-tender, non-distended Neurological: alert & oriented x 3 Skin: warm and dry  Pertinent Labs, Studies, and Procedures:     Latest Ref Rng & Units 05/25/2023    7:09 AM 05/17/2023    4:17 AM 05/16/2023    4:02 AM  CBC  WBC 4.0 - 10.5 K/uL 9.4  9.3  10.3   Hemoglobin 12.0 - 15.0 g/dL 91.4  78.2  95.6   Hematocrit 36.0 - 46.0 %  36.9  34.7  33.9   Platelets 150 - 400 K/uL 333  331  344        Latest Ref Rng & Units 05/25/2023    7:09 AM 05/19/2023    3:14 AM 05/18/2023    3:58 AM  CMP  Glucose 70 - 99 mg/dL 90  85  952   BUN 8 - 23 mg/dL 23  19  30    Creatinine 0.44 - 1.00 mg/dL 8.41  3.24  4.01   Sodium 135 - 145 mmol/L 131  133  131   Potassium 3.5 - 5.1 mmol/L 4.2  4.3  4.8   Chloride 98 - 111 mmol/L 98  97  94   CO2 22 - 32 mmol/L 23  27  31    Calcium 8.9 - 10.3 mg/dL 8.7  9.1  8.8     DG Chest Portable 1 View Result Date: 05/14/2023 CLINICAL DATA:  dyspnea EXAM: PORTABLE CHEST 1 VIEW COMPARISON:  Chest x-ray 05/08/2023 FINDINGS: The heart and mediastinal contours are unchanged. Atherosclerotic plaque. No focal consolidation. No pulmonary edema. No pleural effusion. No pneumothorax. No acute osseous abnormality. Total reversed right shoulder arthroplasty. IMPRESSION: No active disease. Electronically Signed   By: Tish Frederickson M.D.   On: 05/14/2023 22:55   CT HEAD WO CONTRAST Result Date: 05/14/2023 CLINICAL  DATA:  Neuro deficit, acute, stroke suspected EXAM: CT HEAD WITHOUT CONTRAST TECHNIQUE: Contiguous axial images were obtained from the base of the skull through the vertex without intravenous contrast. RADIATION DOSE REDUCTION: This exam was performed according to the departmental dose-optimization program which includes automated exposure control, adjustment of the mA and/or kV according to patient size and/or use of iterative reconstruction technique. COMPARISON:  Head CT 12/25/2022 FINDINGS: Brain: No intracranial hemorrhage, mass effect, or midline shift. Stable degree of atrophy. No hydrocephalus. The basilar cisterns are patent. No evidence of territorial infarct or acute ischemia. Mild periventricular and deep white matter hypodensity typical of chronic small vessel ischemia. No extra-axial or intracranial fluid collection. Vascular: Atherosclerosis of skullbase vasculature without hyperdense vessel or abnormal calcification. Skull: No fracture or focal lesion. Sinuses/Orbits: Paranasal sinuses and mastoid air cells are clear. The visualized orbits are unremarkable. Mild motion artifact. Other: None. IMPRESSION: 1. No acute intracranial abnormality. 2. Stable atrophy and chronic small vessel ischemia. Electronically Signed   By: Narda Rutherford M.D.   On: 05/14/2023 22:10     Signed: Monna Fam, MD PGY-1 05/25/2023, 2:05 PM   Pager: [phone]

## 2023-05-25 NOTE — Progress Notes (Signed)
    Durable Medical Equipment  (From admission, onward)           Start     Ordered   05/25/23 1517  For home use only DME Walker rolling  Once       Question Answer Comment  Walker: With 5 Inch Wheels   Patient needs a walker to treat with the following condition Generalized weakness      05/25/23 1517

## 2023-05-26 ENCOUNTER — Telehealth: Payer: Self-pay

## 2023-05-26 ENCOUNTER — Telehealth: Payer: Self-pay | Admitting: Internal Medicine

## 2023-05-26 NOTE — Transitions of Care (Post Inpatient/ED Visit) (Signed)
05/26/2023  Name: Marisa Nunez MRN: [id number] DOB: 08-08-1945  Today's TOC FU Call Status: Today's TOC FU Call Status:: Successful TOC FU Call Completed TOC FU Call Complete Date: 05/26/23 Patient's Name and Date of Birth confirmed.  Transition Care Management Follow-up Telephone Call Date of Discharge: 05/25/23 Discharge Facility: [facility]) Type of Discharge: Inpatient Admission Reason for ED Visit: Other: (altered mental) How have you been since you were released from the hospital?: Same Any questions or concerns?: No  Items Reviewed: Did you receive and understand the discharge instructions provided?: Yes Medications obtained,verified, and reconciled?: Yes (Medications Reviewed) Any new allergies since your discharge?: No Dietary orders reviewed?: Yes Do you have support at home?: Yes People in Home: child(ren), adult  Medications Reviewed Today: Medications Reviewed Today     Reviewed by Karena Addison, LPN (Licensed Practical Nurse) on 05/26/23 at 1251  Med List Status: <None>   Medication Order Taking? Sig Documenting Provider Last Dose Status Informant  acetaminophen (TYLENOL) 500 MG tablet [id number] No Take 1 tablet (500 mg total) by mouth every 6 (six) hours as needed for moderate pain, fever or mild pain. Pokhrel, Rebekah [place], MD Past Week Active Child, Multiple Informants, Self  albuterol (PROVENTIL) (2.5 MG/3ML) 0.083% nebulizer solution [id number] No Take 3 mLs (2.5 mg total) by nebulization every 4 (four) hours as needed for shortness of breath or wheezing. Earney Navy, NP Past Week Active Child, Multiple Informants, Self  albuterol (VENTOLIN HFA) 108 (90 Base) MCG/ACT inhaler [id number] No Inhale 1-2 puffs into the lungs every 6 (six) hours as needed. Radford Pax, NP Past Week Active Child, Multiple Informants, Self  benzonatate (TESSALON) 100 MG capsule [id number] No Take 1 capsule (100 mg total) by mouth every 8 (eight) hours.  Patient taking  differently: Take 100 mg by mouth 2 (two) times daily as needed for cough.   Radford Pax, NP Past Week Active Child, Multiple Informants, Self  busPIRone (BUSPAR) 5 MG tablet [id number]  Take 1 tablet (5 mg total) by mouth 2 (two) times daily. Monna Fam, MD  Active   citalopram (CELEXA) 20 MG tablet [id number]  Take 1 tablet (20 mg total) by mouth daily. Monna Fam, MD  Active   divalproex (DEPAKOTE SPRINKLE) 125 MG capsule [id number] No Take 2 capsules (250 mg total) by mouth 2 (two) times daily. Mercie Eon, MD 05/07/2023 Morning Expired 05/08/23 2359 Child           Med Note (SATTERFIELD, DARIUS E   Fri May 08, 2023  5:46 PM) Daughter states she is taking this medication   furosemide (LASIX) 20 MG tablet [id number] No Take 2 tablets (40 mg total) by mouth daily. Mercie Eon, MD 05/14/2023 Morning Active Child, Multiple Informants, Self  Melatonin 10 MG TABS [id number] No Take 10 mg by mouth at bedtime. [provider] 05/07/2023 Bedtime Active Child, Multiple Informants, Self  memantine (NAMENDA) 10 MG tablet [id number]  Take 1 tablet (10 mg total) by mouth 2 (two) times daily. Monna Fam, MD  Active   metoprolol succinate (TOPROL-XL) 50 MG 24 hr tablet [id number] No Take 1 tablet (50 mg total) by mouth daily. Take with or immediately following a meal. Mercie Eon, MD 05/07/2023  8:00 AM Expired 05/08/23 2359 Child           Med Note (SATTERFIELD, DARIUS E   Fri May 08, 2023  5:46 PM) Daughter states patient is taking this medication   Multiple Vitamins-Minerals (MULTIVITAMIN WITH MINERALS) tablet [id number] No  Take 1 tablet by mouth daily with breakfast. [provider] 05/14/2023 Morning Active Child, Multiple Informants, Self  ondansetron (ZOFRAN-ODT) 4 MG disintegrating tablet [id number] No Take 4 mg by mouth every 8 (eight) hours as needed for vomiting or nausea (dissolve orally). [provider] Past Week Active Child, Multiple Informants, Self  QUEtiapine (SEROQUEL)  50 MG tablet [id number]  Take 1 tablet (50 mg total) by mouth in the morning. Monna Fam, MD  Active   QUEtiapine (SEROQUEL) 50 MG tablet [id number]  Take 1.5 tablets (75 mg total) by mouth at bedtime. Monna Fam, MD  Active   rivaroxaban (XARELTO) 20 MG TABS tablet [id number] No Take 1 tablet (20 mg total) by mouth daily with supper. Do not take with Tandy Gaw, MD 05/14/2023  8:00 AM Active Multiple Informants, Self            Home Care and Equipment/Supplies: Were Home Health Services Ordered?: NA Any new equipment or medical supplies ordered?: Yes Name of Medical supply agency?: [employer] Were you able to get the equipment/medical supplies?: No Do you have any questions related to the use of the equipment/supplies?: No  Functional Questionnaire: Do you need assistance with bathing/showering or dressing?: Yes Do you need assistance with meal preparation?: Yes Do you need assistance with eating?: No Do you have difficulty maintaining continence: No Do you need assistance with getting out of bed/getting out of a chair/moving?: No Do you have difficulty managing or taking your medications?: Yes  Follow up appointments reviewed: PCP Follow-up appointment confirmed?: Yes Date of PCP follow-up appointment?: 06/03/23 Follow-up Provider: [facility] Follow-up appointment confirmed?: No Follow-Up Specialty Provider:: cardio Reason Specialist Follow-Up Not Confirmed: Patient has Specialist Provider Number and will Call for Appointment Do you need transportation to your follow-up appointment?: No Do you understand care options if your condition(s) worsen?: Yes-patient verbalized understanding    SIGNATURE Karena Addison, LPN [facility] Nurse Health Advisor Direct Dial [phone]

## 2023-05-26 NOTE — Telephone Encounter (Signed)
Daughter is calling back to speak to the nurse. Please advise

## 2023-05-26 NOTE — Telephone Encounter (Signed)
Pt c/o Shortness Of Breath: STAT if SOB developed within the last 24 hours or pt is noticeably SOB on the phone  1. Are you currently SOB (can you hear that pt is SOB on the phone)? Not sure, pt is laying down   2. How long have you been experiencing SOB? Started complaining of SOB today   3. Are you SOB when sitting or when up moving around? While sitting down   4. Are you currently experiencing any other symptoms? No   [name] called in to rescheduled patient's hospital f/u due to her being discharged last night. Appt was setup for tomorrow, 01/29 at 10:30 am with Georgie Chard at [facility]. [name] then states the pt complained of SOB today when she woke up at 11 am, and you could hear the SOB while she was sitting. She reports she ate a piece of pizza then went back to bed and has been there ever since.   Daughter is concerned due to stating they took her off of Lasix while hospitalized. Please advise.

## 2023-05-26 NOTE — Telephone Encounter (Signed)
Spoke with pt daughter, she reports the patient got up this morning, said she did not feel good, ate pizza and went back to bed. The daughter could hear her breathing while she was sitting in the chair. She is now resting in bed propped on 2 pillows, which is her norm. The patient does not have any swelling of her feet and ankles. There is concern because the furosemide was stopped. Aware according to the notes the furosemide was held because of blood pressure and low sodium. Advised daughter to continue to monitor her for today as she maybe resting to catch up because of not being able to rest in the hospital. She has a follow up appointment tomorrow and daughter voiced understanding to make sure to call if symptoms change or worsen prior to that appointment.

## 2023-05-26 NOTE — Telephone Encounter (Signed)
Called and spoke to patient's daughter. Verified name and DOB. She has concerns about patient's breathing. She reports she can hear patient breathing and has pursed lips when breathing. Advised her if she was concerned about patient breathing and in distress that it would be best to take her back to the ED. She stated she would call EMS. Advised her that would be good and if the felt she needed to go they could try to encourage patient to go to the ED. She verbalized understanding and agree.

## 2023-05-27 ENCOUNTER — Inpatient Hospital Stay (HOSPITAL_COMMUNITY)
Admission: EM | Admit: 2023-05-27 | Discharge: 2023-06-04 | DRG: 291 | Disposition: A | Discharge status: Other Acute Inpatient Hospital | Payer: Medicare Other | Attending: Infectious Diseases | Admitting: Infectious Diseases

## 2023-05-27 ENCOUNTER — Encounter (HOSPITAL_COMMUNITY): Payer: Self-pay

## 2023-05-27 ENCOUNTER — Encounter: Payer: Self-pay | Admitting: Cardiology

## 2023-05-27 ENCOUNTER — Telehealth: Payer: Self-pay | Admitting: Cardiology

## 2023-05-27 ENCOUNTER — Ambulatory Visit: Payer: Medicare Other | Attending: Cardiology | Admitting: Cardiology

## 2023-05-27 ENCOUNTER — Emergency Department (HOSPITAL_COMMUNITY): Payer: Medicare Other

## 2023-05-27 ENCOUNTER — Other Ambulatory Visit: Payer: Self-pay

## 2023-05-27 VITALS — BP 98/60 | HR 88 | Ht 64.0 in | Wt 114.0 lb

## 2023-05-27 DIAGNOSIS — Z66 Do not resuscitate: Secondary | ICD-10-CM | POA: Diagnosis present

## 2023-05-27 DIAGNOSIS — Y9301 Activity, walking, marching and hiking: Secondary | ICD-10-CM | POA: Diagnosis not present

## 2023-05-27 DIAGNOSIS — F03C18 Unspecified dementia, severe, with other behavioral disturbance: Secondary | ICD-10-CM | POA: Diagnosis present

## 2023-05-27 DIAGNOSIS — Z79899 Other long term (current) drug therapy: Secondary | ICD-10-CM | POA: Diagnosis not present

## 2023-05-27 DIAGNOSIS — I429 Cardiomyopathy, unspecified: Secondary | ICD-10-CM | POA: Diagnosis present

## 2023-05-27 DIAGNOSIS — I340 Nonrheumatic mitral (valve) insufficiency: Secondary | ICD-10-CM | POA: Diagnosis not present

## 2023-05-27 DIAGNOSIS — I4891 Unspecified atrial fibrillation: Secondary | ICD-10-CM | POA: Diagnosis not present

## 2023-05-27 DIAGNOSIS — I5023 Acute on chronic systolic (congestive) heart failure: Secondary | ICD-10-CM

## 2023-05-27 DIAGNOSIS — Z7189 Other specified counseling: Secondary | ICD-10-CM

## 2023-05-27 DIAGNOSIS — Z538 Procedure and treatment not carried out for other reasons: Secondary | ICD-10-CM | POA: Diagnosis not present

## 2023-05-27 DIAGNOSIS — R0602 Shortness of breath: Principal | ICD-10-CM

## 2023-05-27 DIAGNOSIS — S0990XA Unspecified injury of head, initial encounter: Secondary | ICD-10-CM | POA: Diagnosis not present

## 2023-05-27 DIAGNOSIS — Z87440 Personal history of urinary (tract) infections: Secondary | ICD-10-CM

## 2023-05-27 DIAGNOSIS — Z853 Personal history of malignant neoplasm of breast: Secondary | ICD-10-CM | POA: Diagnosis not present

## 2023-05-27 DIAGNOSIS — F17210 Nicotine dependence, cigarettes, uncomplicated: Secondary | ICD-10-CM | POA: Diagnosis present

## 2023-05-27 DIAGNOSIS — I700 Atherosclerosis of aorta: Secondary | ICD-10-CM | POA: Diagnosis not present

## 2023-05-27 DIAGNOSIS — Z96611 Presence of right artificial shoulder joint: Secondary | ICD-10-CM | POA: Diagnosis not present

## 2023-05-27 DIAGNOSIS — W1830XA Fall on same level, unspecified, initial encounter: Secondary | ICD-10-CM | POA: Diagnosis not present

## 2023-05-27 DIAGNOSIS — J449 Chronic obstructive pulmonary disease, unspecified: Secondary | ICD-10-CM | POA: Diagnosis present

## 2023-05-27 DIAGNOSIS — Z681 Body mass index (BMI) 19 or less, adult: Secondary | ICD-10-CM | POA: Diagnosis not present

## 2023-05-27 DIAGNOSIS — Z8673 Personal history of transient ischemic attack (TIA), and cerebral infarction without residual deficits: Secondary | ICD-10-CM | POA: Diagnosis not present

## 2023-05-27 DIAGNOSIS — N390 Urinary tract infection, site not specified: Secondary | ICD-10-CM | POA: Diagnosis present

## 2023-05-27 DIAGNOSIS — F03918 Unspecified dementia, unspecified severity, with other behavioral disturbance: Secondary | ICD-10-CM | POA: Diagnosis not present

## 2023-05-27 DIAGNOSIS — R64 Cachexia: Secondary | ICD-10-CM | POA: Diagnosis present

## 2023-05-27 DIAGNOSIS — I071 Rheumatic tricuspid insufficiency: Secondary | ICD-10-CM | POA: Diagnosis not present

## 2023-05-27 DIAGNOSIS — Z888 Allergy status to other drugs, medicaments and biological substances status: Secondary | ICD-10-CM

## 2023-05-27 DIAGNOSIS — Z7901 Long term (current) use of anticoagulants: Secondary | ICD-10-CM | POA: Diagnosis not present

## 2023-05-27 DIAGNOSIS — I110 Hypertensive heart disease with heart failure: Principal | ICD-10-CM | POA: Diagnosis present

## 2023-05-27 DIAGNOSIS — I4819 Other persistent atrial fibrillation: Secondary | ICD-10-CM

## 2023-05-27 DIAGNOSIS — I10 Essential (primary) hypertension: Secondary | ICD-10-CM

## 2023-05-27 DIAGNOSIS — E871 Hypo-osmolality and hyponatremia: Secondary | ICD-10-CM | POA: Diagnosis present

## 2023-05-27 DIAGNOSIS — Z743 Need for continuous supervision: Secondary | ICD-10-CM | POA: Diagnosis not present

## 2023-05-27 DIAGNOSIS — I959 Hypotension, unspecified: Secondary | ICD-10-CM | POA: Diagnosis present

## 2023-05-27 DIAGNOSIS — I5043 Acute on chronic combined systolic (congestive) and diastolic (congestive) heart failure: Secondary | ICD-10-CM | POA: Diagnosis present

## 2023-05-27 DIAGNOSIS — I081 Rheumatic disorders of both mitral and tricuspid valves: Secondary | ICD-10-CM | POA: Diagnosis present

## 2023-05-27 DIAGNOSIS — N179 Acute kidney failure, unspecified: Secondary | ICD-10-CM | POA: Diagnosis not present

## 2023-05-27 DIAGNOSIS — Z887 Allergy status to serum and vaccine status: Secondary | ICD-10-CM

## 2023-05-27 DIAGNOSIS — I509 Heart failure, unspecified: Secondary | ICD-10-CM | POA: Diagnosis not present

## 2023-05-27 DIAGNOSIS — I480 Paroxysmal atrial fibrillation: Secondary | ICD-10-CM | POA: Diagnosis not present

## 2023-05-27 DIAGNOSIS — R270 Ataxia, unspecified: Secondary | ICD-10-CM | POA: Diagnosis not present

## 2023-05-27 DIAGNOSIS — Z515 Encounter for palliative care: Secondary | ICD-10-CM

## 2023-05-27 DIAGNOSIS — M545 Low back pain, unspecified: Secondary | ICD-10-CM | POA: Diagnosis not present

## 2023-05-27 DIAGNOSIS — Z7401 Bed confinement status: Secondary | ICD-10-CM | POA: Diagnosis not present

## 2023-05-27 DIAGNOSIS — R404 Transient alteration of awareness: Secondary | ICD-10-CM | POA: Diagnosis not present

## 2023-05-27 DIAGNOSIS — Z90710 Acquired absence of both cervix and uterus: Secondary | ICD-10-CM | POA: Diagnosis not present

## 2023-05-27 DIAGNOSIS — R9082 White matter disease, unspecified: Secondary | ICD-10-CM | POA: Diagnosis not present

## 2023-05-27 LAB — CBC
HCT: 38.7 % (ref 36.0–46.0)
Hemoglobin: 12.3 g/dL (ref 12.0–15.0)
MCH: 27.1 pg (ref 26.0–34.0)
MCHC: 31.8 g/dL (ref 30.0–36.0)
MCV: 85.2 fL (ref 80.0–100.0)
Platelets: 368 10*3/uL (ref 150–400)
RBC: 4.54 MIL/uL (ref 3.87–5.11)
RDW: 15.2 % (ref 11.5–15.5)
WBC: 11 10*3/uL — ABNORMAL HIGH (ref 4.0–10.5)
nRBC: 0 % (ref 0.0–0.2)

## 2023-05-27 LAB — URINALYSIS, W/ REFLEX TO CULTURE (INFECTION SUSPECTED)
Bacteria, UA: NONE SEEN
Bilirubin Urine: NEGATIVE
Glucose, UA: NEGATIVE mg/dL
Hgb urine dipstick: NEGATIVE
Ketones, ur: NEGATIVE mg/dL
Nitrite: NEGATIVE
Protein, ur: NEGATIVE mg/dL
Specific Gravity, Urine: 1.015 (ref 1.005–1.030)
pH: 5.5 (ref 5.0–8.0)

## 2023-05-27 LAB — BASIC METABOLIC PANEL
Anion gap: 12 (ref 5–15)
BUN: 11 mg/dL (ref 8–23)
CO2: 29 mmol/L (ref 22–32)
Calcium: 8.7 mg/dL — ABNORMAL LOW (ref 8.9–10.3)
Chloride: 98 mmol/L (ref 98–111)
Creatinine, Ser: 0.76 mg/dL (ref 0.44–1.00)
GFR, Estimated: >60 mL/min (ref >60)
Glucose, Bld: 63 mg/dL — ABNORMAL LOW (ref 70–99)
Potassium: 3.9 mmol/L (ref 3.5–5.1)
Sodium: 139 mmol/L (ref 135–145)

## 2023-05-27 LAB — TROPONIN I (HIGH SENSITIVITY)
Troponin I (High Sensitivity): 10 ng/L (ref <18)
Troponin I (High Sensitivity): 11 ng/L (ref <18)

## 2023-05-27 LAB — BRAIN NATRIURETIC PEPTIDE: B Natriuretic Peptide: 1131.2 pg/mL — ABNORMAL HIGH (ref 0.0–100.0)

## 2023-05-27 MED ORDER — RIVAROXABAN 20 MG PO TABS
20.0000 mg | ORAL_TABLET | Freq: Every day | ORAL | Status: DC
  Administered 2023-05-28 – 2023-06-01 (×5): 20 mg via ORAL
  Filled 2023-05-27 (×5): qty 1

## 2023-05-27 MED ORDER — METOPROLOL TARTRATE 5 MG/5ML IV SOLN
5.0000 mg | Freq: Once | INTRAVENOUS | Status: AC
  Administered 2023-05-27: 5 mg via INTRAVENOUS
  Filled 2023-05-27: qty 5

## 2023-05-27 MED ORDER — FUROSEMIDE 10 MG/ML IJ SOLN
40.0000 mg | Freq: Once | INTRAMUSCULAR | Status: AC
  Administered 2023-05-27: 40 mg via INTRAVENOUS
  Filled 2023-05-27: qty 4

## 2023-05-27 MED ORDER — METOPROLOL SUCCINATE ER 25 MG PO TB24
50.0000 mg | ORAL_TABLET | Freq: Every day | ORAL | Status: DC
  Administered 2023-05-28: 50 mg via ORAL
  Filled 2023-05-27: qty 2

## 2023-05-27 MED ORDER — DIVALPROEX SODIUM 125 MG PO CSDR
250.0000 mg | DELAYED_RELEASE_CAPSULE | Freq: Two times a day (BID) | ORAL | Status: DC
  Administered 2023-05-28 – 2023-06-04 (×15): 250 mg via ORAL
  Filled 2023-05-27 (×16): qty 2

## 2023-05-27 MED ORDER — MELATONIN 3 MG PO TABS
3.0000 mg | ORAL_TABLET | Freq: Every day | ORAL | Status: DC
  Administered 2023-05-27 – 2023-06-03 (×8): 3 mg via ORAL
  Filled 2023-05-27 (×8): qty 1

## 2023-05-27 MED ORDER — QUETIAPINE FUMARATE 50 MG PO TABS: 75.0000 mg | ORAL_TABLET | Freq: Every day | ORAL | Status: DC

## 2023-05-27 MED ORDER — HALOPERIDOL LACTATE 5 MG/ML IJ SOLN
5.0000 mg | Freq: Once | INTRAMUSCULAR | Status: AC
  Administered 2023-05-27: 5 mg via INTRAVENOUS
  Filled 2023-05-27: qty 1

## 2023-05-27 MED ORDER — QUETIAPINE FUMARATE 25 MG PO TABS
75.0000 mg | ORAL_TABLET | Freq: Once | ORAL | Status: AC
  Administered 2023-05-27: 75 mg via ORAL
  Filled 2023-05-27: qty 3

## 2023-05-27 MED ORDER — BUSPIRONE HCL 5 MG PO TABS
5.0000 mg | ORAL_TABLET | Freq: Two times a day (BID) | ORAL | Status: DC
Start: 2023-05-28 — End: 2023-06-04
  Administered 2023-05-28 – 2023-06-04 (×15): 5 mg via ORAL
  Filled 2023-05-27 (×15): qty 1

## 2023-05-27 MED ORDER — ALBUTEROL SULFATE (2.5 MG/3ML) 0.083% IN NEBU: 2.5000 mg | INHALATION_SOLUTION | Freq: Four times a day (QID) | RESPIRATORY_TRACT | Status: DC | PRN

## 2023-05-27 MED ORDER — MEMANTINE HCL 10 MG PO TABS
10.0000 mg | ORAL_TABLET | Freq: Two times a day (BID) | ORAL | Status: DC
  Administered 2023-05-28 – 2023-06-04 (×15): 10 mg via ORAL
  Filled 2023-05-27 (×15): qty 1

## 2023-05-27 MED ORDER — CITALOPRAM HYDROBROMIDE 20 MG PO TABS
20.0000 mg | ORAL_TABLET | Freq: Every day | ORAL | Status: DC
  Administered 2023-05-28 – 2023-06-04 (×8): 20 mg via ORAL
  Filled 2023-05-27 (×3): qty 1
  Filled 2023-05-27: qty 2
  Filled 2023-05-27 (×4): qty 1

## 2023-05-27 NOTE — ED Provider Triage Note (Signed)
Emergency Medicine Provider Triage Evaluation Note  Marisa Nunez , a 78 y.o. female  was evaluated in triage.  Pt complains of dementia and SOB?.  Review of Systems  Positive:  Negative:   Physical Exam  BP (!) 119/92 (BP Location: Left Arm)   Pulse (!) 119   Temp 98.3 F (36.8 C) (Oral)   Resp (!) 22   Ht 5\' 4"  (1.626 m)   Wt 51.7 kg   LMP  (LMP Unknown)   SpO2 96%   BMI 19.57 kg/m  Gen:   Awake, no distress   Resp:  Normal effort  MSK:   Moves extremities without difficulty  Other:    Medical Decision Making  Medically screening exam initiated at 2:18 PM.  Appropriate orders placed.  Marisa Nunez was informed that the remainder of the evaluation will be completed by another provider, this initial triage assessment does not replace that evaluation, and the importance of remaining in the ED until their evaluation is complete.  Patient recently discharged from hospital. Apparently, patient went to outpatient cardiology appointment with PA Marisa Nunez who asked why patient did not have valve surgery while recently admitted. Provider told patient to go to ED so that she could be admitted for valve surgery.  Family member at bedside stating that patient has appeared to be SOB recently. Patient demented at baseline and not able to answer questions appropriately.    Marisa Nunez, [place] 05/27/23 1429

## 2023-05-27 NOTE — ED Triage Notes (Signed)
Patient BIB family after cardiology sent her. Family states that her heart valve isn't pumping right and her sodium is low. Family also reports patient is anxious and has hx of dementia. Patient appears anxious and tachypneic in triage. Hx of afib. Cards is reportedly aware of patient coming to ED.

## 2023-05-27 NOTE — Patient Instructions (Addendum)
You are being sent to the Emergency Room.  We hope you feel better.

## 2023-05-27 NOTE — Telephone Encounter (Signed)
Spoke with Daughter and Son in  [place]. They called because they were advised to go to ED with patient at her OV today. Patient is getting agitated and wanted to speak with Noreene Larsson.  Advised provider is out of office this afternoon and please be patient.

## 2023-05-27 NOTE — ED Notes (Signed)
Per ED attending physician, Dr. Theresia Lo, patient okay to eat and drink. Patient provided with water and [place] sandwich.

## 2023-05-27 NOTE — ED Provider Notes (Signed)
[facility] AT [facility] Provider Note   CSN: [id number] Arrival date & time: 05/27/23  1205     History  Chief Complaint  Patient presents with   Shortness of Breath    Marisa Nunez is a 78 y.o. female.  Patient is a 78 year old female with a past medical history of dementia, A-fib, CHF, severe MR/TR that presented to the emergency department from the cardiology office with shortness of breath.  The patient had a recent admission for CHF exacerbation and was found to have new severe MR and TR on her echo.  Was initially planned for outpatient management but was seen in the office today for her worsening shortness of breath and was recommended admission for expedited workup and management.  Family reports that she has been short of breath at rest and on exertion.  They state that she has been sleeping a lot recently.  They state that they have not noticed any swelling in her legs.  They state that she has been taking her medications as prescribed.  The history is provided by a relative. History limited by: Dementia.  Shortness of Breath      Home Medications Prior to Admission medications   Medication Sig Start Date End Date Taking? Authorizing Provider  acetaminophen (TYLENOL) 500 MG tablet Take 1 tablet (500 mg total) by mouth every 6 (six) hours as needed for moderate pain, fever or mild pain. 10/24/21  Yes Pokhrel, Laxman, MD  albuterol (PROVENTIL) (2.5 MG/3ML) 0.083% nebulizer solution Take 3 mLs (2.5 mg total) by nebulization every 4 (four) hours as needed for shortness of breath or wheezing. 12/29/22  Yes Dahlia Byes C, NP  albuterol (VENTOLIN HFA) 108 (90 Base) MCG/ACT inhaler Inhale 1-2 puffs into the lungs every 6 (six) hours as needed. Patient taking differently: Inhale 1-2 puffs into the lungs every 6 (six) hours as needed for shortness of breath. 05/04/23  Yes Radford Pax, NP  busPIRone (BUSPAR) 5 MG tablet Take 1 tablet (5 mg total)  by mouth 2 (two) times daily. 05/25/23  Yes Monna Fam, MD  citalopram (CELEXA) 20 MG tablet Take 1 tablet (20 mg total) by mouth daily. 05/26/23  Yes Monna Fam, MD  divalproex (DEPAKOTE SPRINKLE) 125 MG capsule Take 2 capsules (250 mg total) by mouth 2 (two) times daily. 04/06/23 05/27/23 Yes Mercie Eon, MD  Melatonin 10 MG TABS Take 10 mg by mouth at bedtime.   Yes [provider]  memantine (NAMENDA) 10 MG tablet Take 1 tablet (10 mg total) by mouth 2 (two) times daily. 05/25/23  Yes Monna Fam, MD  metoprolol succinate (TOPROL-XL) 50 MG 24 hr tablet Take 1 tablet (50 mg total) by mouth daily. Take with or immediately following a meal. 01/28/23 05/27/23 Yes Mercie Eon, MD  Multiple Vitamins-Minerals (MULTIVITAMIN WITH MINERALS) tablet Take 1 tablet by mouth daily with breakfast.   Yes [provider]  ondansetron (ZOFRAN-ODT) 4 MG disintegrating tablet Take 4 mg by mouth every 8 (eight) hours as needed for vomiting or nausea (dissolve orally). 06/29/22  Yes [provider]  QUEtiapine (SEROQUEL) 50 MG tablet Take 1.5 tablets (75 mg total) by mouth at bedtime. 05/25/23  Yes Monna Fam, MD  rivaroxaban (XARELTO) 20 MG TABS tablet Take 1 tablet (20 mg total) by mouth daily with supper. Do not take with Eliquis 05/11/23 08/09/23 Yes Mercie Eon, MD  benzonatate (TESSALON) 100 MG capsule Take 1 capsule (100 mg total) by mouth every 8 (eight) hours.  Patient not taking: Reported on 05/27/2023 05/04/23   Radford Pax, NP  furosemide (LASIX) 20 MG tablet Take 2 tablets (40 mg total) by mouth daily. 01/20/23   Mercie Eon, MD      Allergies    Influenza virus vaccine, Myrbetriq [mirabegron er], Pneumococcal vaccine, and Ativan [lorazepam]    Review of Systems   Review of Systems  Respiratory:  Positive for shortness of breath.     Physical Exam Updated Vital Signs BP 113/73 (BP Location: Right Arm)   Pulse (!) 107   Temp 98.7 F (37.1 C) (Oral)   Resp (!) 24   Ht  5\' 4"  (1.626 m)   Wt 51.7 kg   LMP  (LMP Unknown)   SpO2 100%   BMI 19.57 kg/m  Physical Exam Vitals and nursing note reviewed.  Constitutional:      General: She is not in acute distress.    Appearance: She is well-developed.  HENT:     Head: Normocephalic and atraumatic.     Mouth/Throat:     Mouth: Mucous membranes are moist.  Eyes:     Extraocular Movements: Extraocular movements intact.  Cardiovascular:     Rate and Rhythm: Tachycardia present. Rhythm irregular.  Pulmonary:     Effort: Tachypnea present. No accessory muscle usage or respiratory distress.     Breath sounds: Normal breath sounds.  Abdominal:     Palpations: Abdomen is soft.     Tenderness: There is no abdominal tenderness.  Musculoskeletal:        General: Normal range of motion.     Cervical back: Normal range of motion and neck supple.     Right lower leg: No edema.     Left lower leg: No edema.  Skin:    General: Skin is warm and dry.  Neurological:     General: No focal deficit present.     Mental Status: She is alert.  Psychiatric:        Mood and Affect: Mood normal.     ED Results / Procedures / Treatments   Labs (all labs ordered are listed, but only abnormal results are displayed) Labs Reviewed  BASIC METABOLIC PANEL - Abnormal; Notable for the following components:      Result Value   Glucose, Bld 63 (*)    Calcium 8.7 (*)    All other components within normal limits  CBC - Abnormal; Notable for the following components:   WBC 11.0 (*)    All other components within normal limits  BRAIN NATRIURETIC PEPTIDE - Abnormal; Notable for the following components:   B Natriuretic Peptide 1,131.2 (*)    All other components within normal limits  URINALYSIS, W/ REFLEX TO CULTURE (INFECTION SUSPECTED) - Abnormal; Notable for the following components:   Leukocytes,Ua SMALL (*)    All other components within normal limits  TROPONIN I (HIGH SENSITIVITY)  TROPONIN I (HIGH SENSITIVITY)     EKG EKG Interpretation Date/Time:  Wednesday May 27 2023 14:05:34 EST Ventricular Rate:  95 PR Interval:    QRS Duration:  86 QT Interval:  380 QTC Calculation: 477 R Axis:   -46  Text Interpretation: Atrial fibrillation Left anterior fascicular block Cannot rule out Anterior infarct , age undetermined Abnormal ECG No significant change since last tracing Confirmed by Melene Plan [phone]) on 05/27/2023 3:00:37 PM  Radiology DG Chest 2 View Result Date: 05/27/2023 CLINICAL DATA:  Shortness of breath EXAM: CHEST - 2 VIEW COMPARISON:  Chest radiograph dated 05/14/2023.  FINDINGS: No focal consolidation, pleural effusion, or pneumothorax. The cardiac silhouette is within normal limits. Atherosclerotic calcification of the aorta. No acute osseous pathology. Degenerative changes of the spine and scoliosis. Right shoulder arthroplasty. IMPRESSION: No active cardiopulmonary disease. Electronically Signed   By: Elgie Collard M.D.   On: 05/27/2023 16:00    Procedures Procedures    Medications Ordered in ED Medications  QUEtiapine (SEROQUEL) tablet 75 mg (75 mg Oral Given 05/27/23 1716)  metoprolol tartrate (LOPRESSOR) injection 5 mg (5 mg Intravenous Given 05/27/23 1717)  furosemide (LASIX) injection 40 mg (40 mg Intravenous Given 05/27/23 1727)    ED Course/ Medical Decision Making/ A&P                                 Medical Decision Making This patient presents to the ED with chief complaint(s) of shortness of breath with pertinent past medical history of dementia, A-fib, severe MR/TR, CHF which further complicates the presenting complaint. The complaint involves an extensive differential diagnosis and also carries with it a high risk of complications and morbidity.    The differential diagnosis includes arrhythmia, anemia, pneumonia, pneumothorax, pulmonary edema, pleural effusion, ACS, valve dysfunction, CHF exacerbation  Additional history obtained: Additional history obtained  from family Records reviewed outpatient cardiology records, recent admission records  ED Course and Reassessment: On patient's arrival she is tachycardic and tachypneic but in no acute distress.  She was initially evaluated in triage and had EKG, labs and chest x-ray performed.  EKG showed rate controlled A-fib.  Labs showed a BNP of 1100, troponin normal, mild leukocytosis.  Chest x-ray is without acute disease.  On my evaluation rates range between the 110s to 130s and she will be given metoprolol for rate control as well as Lasix.  Will plan to consult cardiology with plan for admission to the hospitalist.  Independent labs interpretation:  The following labs were independently interpreted: Elevated BNP, mild leukocytosis  Independent visualization of imaging: - I independently visualized the following imaging with scope of interpretation limited to determining acute life threatening conditions related to emergency care: Chest x-ray, which revealed no acute disease  Consultation: - Consulted or discussed management/test interpretation w/ external professional: Cardiology, hospitalist  Consideration for admission or further workup: Patient requires admission for further workup of her shortness of breath Social Determinants of health: N/A    Risk Prescription drug management.          Final Clinical Impression(s) / ED Diagnoses Final diagnoses:  Shortness of breath  Acute on chronic congestive heart failure, unspecified heart failure type (HCC)  Mitral valve insufficiency, unspecified etiology    Rx / DC Orders ED Discharge Orders     None         Rexford Maus, DO 05/27/23 1815

## 2023-05-27 NOTE — Progress Notes (Signed)
Cardiology Office Note:    Date:  05/27/2023   ID:  Marisa, Nunez 07-21-1945, MRN [id number]  PCP:  Mercie Eon, MD   [facility] Providers Cardiologist:  Parke Poisson, MD Cardiology APP:  Smitty Knudsen     Referring MD: Mercie Eon, MD   Chief Complaint  Patient presents with   Follow-up    History of Present Illness:    Marisa Nunez is a 78 y.o. female with a hx of dementia, PAF, breast CA, HTN, CVA, COPD with prior tobacco use, cardiomyopathy, and severe MR who is being seen after recent hospitalization.   Marisa Nunez was previously followed by Dr. Debroah Loop in [place], [place] primarily for paroxysmal atrial fibrillation. She moved to [facility] in 2022. Prior Echos in 06/2018 and 10/2020 showed LVEF of 55-60% and grade 1 diastolic dysfunction. She was initially seen by our team during admission from 11/26/2021 to 11/28/2021 for acute CHF and atrial fibrillation with RVR after presenting with shortness of breath and lower extremity edema. Echo showed LVEF of 40-45% with global hypokinesis, mildly reduced RV systolic function, and moderate MR. She was diuresed with IV Lasix with good urinary response and then transitioned to PO Lasix 40mg  daily. Home Toprol-XL was increased for better heart rate control and home and she was continued on home Losartan. She was not started on SGLT2 inhibitor due to frequent UTIs. In follow up cardiac PET was ordered due to drop in EF although it does not appear this was ever performed.   She underwent DCCV 01/2022 and was seen by the AF Clinic 02/2022 and was back in AF by that time. She was noted to be on Seroquel which was not found to be compatible with many antiarrhythmics therefore plan was for rate control. She has not been seen by cardiology since then.   She then presented to the ED and was admitted by teaching service given worsening SOB found to have AF with RVR and incidentally have UTI, hyponatremia. She was  treated with antibiotics and diuretics. Echocardiogram performed which showed Ef at 45-50% (stable), moderate to severe MR and severe TR which had worsened from prior echo imaging 11/2021. Cardiology was not consulted and the patient was discharged 05/25/23.   Her daughter called the office for an OV out of concern for the patients breathing. She is here today and is tripod breathing during our visit. Daughter reports this has been constant since discharge. She has symptoms of orthopnea but no overt fluid volume overload on immediate exam. Sounds like there was a discussion in the hospital about valve referral but based on her appearance today, she likely will not be able to wait that long. The patient denies chest pain, palpitations, LE edema, dizziness, or syncope. Denies bleeding in stool or urine. She is quite SOB even at rest. Given my concern, plan for hospital admission discussed and the patient and family are in agreement.    Past Medical History:  Diagnosis Date   Anxiety    Breast cancer (HCC)    Chronic combined systolic and diastolic CHF (congestive heart failure) (HCC)    a. Echo 11/2021: LVEF of 40-45% with global hypokinesis, mildly reduced RV systolic function, and moderate MR   COPD (chronic obstructive pulmonary disease) (HCC)    Dementia (HCC)    Depression    History of TIA 10/30/2020   History of tobacco use 05/06/2017   Formatting of this note might be different from the original.  Greater than 40 pack year history. Quit in 2015.     HTN (hypertension)    Paroxysmal atrial fibrillation (HCC) 2017   Stroke [facility])     Past Surgical History:  Procedure Laterality Date   ABDOMINAL HYSTERECTOMY     CARDIOVERSION N/A 02/19/2022   Procedure: CARDIOVERSION;  Surgeon: Meriam Sprague, MD;  Location: [facility] ENDOSCOPY;  Service: Cardiovascular;  Laterality: N/A;   REVERSE SHOULDER ARTHROPLASTY Right 10/21/2021   Procedure: RIGHT REVERSE TOTAL SHOULDER ARTHROPLASTY;  Surgeon: Bjorn Pippin, MD;  Location: [facility] OR;  Service: Orthopedics;  Laterality: Right;  allen bed, spider, tornier (rep aware), open shoulder tray.    Current Medications: Current Meds  Medication Sig   acetaminophen (TYLENOL) 500 MG tablet Take 1 tablet (500 mg total) by mouth every 6 (six) hours as needed for moderate pain, fever or mild pain.   albuterol (PROVENTIL) (2.5 MG/3ML) 0.083% nebulizer solution Take 3 mLs (2.5 mg total) by nebulization every 4 (four) hours as needed for shortness of breath or wheezing.   albuterol (VENTOLIN HFA) 108 (90 Base) MCG/ACT inhaler Inhale 1-2 puffs into the lungs every 6 (six) hours as needed.   benzonatate (TESSALON) 100 MG capsule Take 1 capsule (100 mg total) by mouth every 8 (eight) hours.   busPIRone (BUSPAR) 5 MG tablet Take 1 tablet (5 mg total) by mouth 2 (two) times daily.   citalopram (CELEXA) 20 MG tablet Take 1 tablet (20 mg total) by mouth daily.   divalproex (DEPAKOTE SPRINKLE) 125 MG capsule Take 2 capsules (250 mg total) by mouth 2 (two) times daily.   [Paused] furosemide (LASIX) 20 MG tablet Take 2 tablets (40 mg total) by mouth daily.   Melatonin 10 MG TABS Take 10 mg by mouth at bedtime.   memantine (NAMENDA) 10 MG tablet Take 1 tablet (10 mg total) by mouth 2 (two) times daily.   metoprolol succinate (TOPROL-XL) 50 MG 24 hr tablet Take 1 tablet (50 mg total) by mouth daily. Take with or immediately following a meal.   Multiple Vitamins-Minerals (MULTIVITAMIN WITH MINERALS) tablet Take 1 tablet by mouth daily with breakfast.   ondansetron (ZOFRAN-ODT) 4 MG disintegrating tablet Take 4 mg by mouth every 8 (eight) hours as needed for vomiting or nausea (dissolve orally).   QUEtiapine (SEROQUEL) 50 MG tablet Take 1.5 tablets (75 mg total) by mouth at bedtime.   rivaroxaban (XARELTO) 20 MG TABS tablet Take 1 tablet (20 mg total) by mouth daily with supper. Do not take with Eliquis     Allergies:   Influenza virus vaccine, Myrbetriq [mirabegron er],  Pneumococcal vaccine, and Ativan [lorazepam]   Social History   Socioeconomic History   Marital status: Divorced    Spouse name: Not on file   Number of children: Not on file   Years of education: Not on file   Highest education level: Not on file  Occupational History   Not on file  Tobacco Use   Smoking status: Former    Current packs/day: 0.50    Types: Cigarettes   Smokeless tobacco: Not on file  Vaping Use   Vaping status: Never Used  Substance and Sexual Activity   Alcohol use: Not Currently    Alcohol/week: 6.0 standard drinks of alcohol    Types: 6 Cans of beer per week   Drug use: Not Currently   Sexual activity: Not Currently  Other Topics Concern   Not on file  Social History Narrative   Not on file  Social Drivers of Corporate investment banker Strain: Low Risk  (01/28/2023)   Overall Financial Resource Strain (CARDIA)    Difficulty of Paying Living Expenses: Not hard at all  Food Insecurity: No Food Insecurity (05/15/2023)   Hunger Vital Sign    Worried About Running Out of Food in the Last Year: Never true    Ran Out of Food in the Last Year: Never true  Transportation Needs: No Transportation Needs (05/15/2023)   PRAPARE - Administrator, Civil Service (Medical): No    Lack of Transportation (Non-Medical): No  Physical Activity: Sufficiently Active (01/28/2023)   Exercise Vital Sign    Days of Exercise per Week: 7 days    Minutes of Exercise per Session: 50 min  Stress: No Stress Concern Present (01/28/2023)   [employer] of Occupational Health - Occupational Stress Questionnaire    Feeling of Stress : Not at all  Social Connections: Unknown (05/15/2023)   Social Connection and Isolation Panel [NHANES]    Frequency of Communication with Friends and Family: Three times a week    Frequency of Social Gatherings with Friends and Family: More than three times a week    Attends Religious Services: Never    Database administrator or  Organizations: Patient declined    Attends Banker Meetings: Patient declined    Marital Status: Divorced    Family History: The patient's family history includes Arrhythmia in her father.  ROS:   Please see the history of present illness.     All other systems reviewed and are negative.  EKGs/Labs/Other Studies Reviewed:    The following studies were reviewed today:    Echocardiogram 05/09/23:   1. Left ventricular ejection fraction, by estimation, is 45 to 50%. The  left ventricle has mildly decreased function. Left ventricular diastolic  parameters are indeterminate.   2. Right ventricular systolic function is normal. The right ventricular  size is normal.   3. Left atrial size was moderately dilated.   4. Right atrial size was moderately dilated.   5. Moderate to severe mitral valve regurgitation.   6. Tricuspid valve regurgitation is severe.   7. The aortic valve is tricuspid. Aortic valve regurgitation is not  visualized.   8. The inferior vena cava is normal in size with greater than 50%  respiratory variability, suggesting right atrial pressure of 3 mmHg.   Recent Labs: 05/08/2023: B Natriuretic Peptide 1,212.3 05/09/2023: TSH 0.704 05/10/2023: Magnesium 1.8 05/14/2023: ALT 20 05/25/2023: BUN 23; Creatinine, Ser 0.73; Hemoglobin 12.0; Platelets 333; Potassium 4.2; Sodium 131  Recent Lipid Panel    Component Value Date/Time   CHOL 202 (H) 10/30/2020 0015   TRIG 70 10/30/2020 0015   HDL 88 10/30/2020 0015   CHOLHDL 2.3 10/30/2020 0015   VLDL 14 10/30/2020 0015   LDLCALC 100 (H) 10/30/2020 0015   Risk Assessment/Calculations:    CHA2DS2-VASc Score = 8   This indicates a 10.8% annual risk of stroke. The patient's score is based upon: CHF History: 1 HTN History: 1 Diabetes History: 0 Stroke History: 2 Vascular Disease History: 1 Age Score: 2 Gender Score: 1          Physical Exam:    VS:  BP 98/60   Pulse 88   Ht 5\' 4"  (1.626 m)   Wt 114  lb (51.7 kg)   LMP  (LMP Unknown)   SpO2 95%   BMI 19.57 kg/m     Wt Readings from Last  3 Encounters:  05/27/23 114 lb (51.7 kg)  05/27/23 114 lb (51.7 kg)  05/06/23 115 lb (52.2 kg)    General: Ill appearing, NAD Lungs:Clear to ausculation bilaterally although diminished in the LLL.  No wheezes, rales, or rhonchi. Breathing is labored. Cardiovascular: Irregular. + systolic murmur over mid axillary line. Abdomen: Soft, non-tender, non-distended. No obvious abdominal masses. Extremities: No edema.  Neuro: Alert and oriented. No focal deficits. No facial asymmetry. MAE spontaneously. Psych: Responds to questions appropriately with normal affect.    ASSESSMENT/PLAN:    Acute systolic CHF: Likely secondary to severe MR/TR. Discussed plan with cardiology team with plans for [facility] admission and cardiology consultation. Likely will need additional IV diuretics. Plan TEE and [facility] for further CAD and MR evaluation.   Severe MR/TR: Now with worsening MR/TR on recent echo with symptoms of acute HF with NYHA class IV symptoms of resting SOB likely driven by worsening valvular disease. Plan to admit and follow with cardiology for MR workup with TEE and R/LHC.Marland Kitchen Diuresis as tolerated. Follow [facility] hemodynamics. Likely not a surgical candidate with advanced dementia. May consider mTEER.   Cardiomyopathy: Unclear if this is ischemic in etiology with no prior workup. Previous OP plan for cardiac PET however this was never performed. Given new worsening MR/TR. Will plan TEE and R/LHC while admitted. Unable to titrate GDMT with hypotension.   Persistent AF: Previous DCCV with brief NSR. Last evaluation with AF Clinic plan was for rate control at that time. Presented recently with AF with RVR. Continue Xarelto. Unable to add antiarrythmic with Buspar and depakote. No palpitations reported today.   HTN: Although quite hypotensive today limiting GDMT and beta blockers. Plan admission as above.    Hyponatremia: Na+ at discharge noted at 131. Follow closely after admission    Medication Adjustments/Labs and Tests Ordered: Current medicines are reviewed at length with the patient today.  Concerns regarding medicines are outlined above.  No orders of the defined types were placed in this encounter.  No orders of the defined types were placed in this encounter.   Patient Instructions  You are being sent to the Emergency Room.  We hope you feel better.    Signed, Georgie Chard, NP  05/27/2023 2:33 PM    [facility]

## 2023-05-27 NOTE — Telephone Encounter (Signed)
Requested cb, pt was sent over to hosp by Georgie Chard and is not doing well due to dementia. Starting to act out and wanting to leave. Son in law Brett Canales is requesting cb asap

## 2023-05-27 NOTE — Hospital Course (Addendum)
Acute heart failure exacerbation Severe mitral regurgitation Severe tricuspid regurgitation Patient presented to the ED from her cardiologist office due to complaints of dyspnea and concern for acute CHF exacerbation. Cardiology was consulted, who recommended TEE to evaluate for possible MTEER, as well as IV Lasix. Patient responded well to diuresis and quickly returned to baseline respiratory status. On the day of planned TEE patient was too agitated to have the procedure performed.   Atrial fibrillation with RVR Patient had runs of RVR during admission, generally rate controlled with home Xarelto 20mg  and metoprolol increased to 75mg  daily.   Dementia with behavioral disturbances Home medication regimen including seroquel 50mg  qam, seroquel 75mg  at bedtime, memantine 10mg  daily, melatonin 3mg  at bedtime, buspirone 5mg  BID, citalopram 20mg  daily, depakote 250mg  BID, prn olanzapine 2.5mg  was continued during admission.

## 2023-05-27 NOTE — Consult Note (Signed)
Cardiology consult Note:   Chief Complaint: Dyspnea  History of Present Illness:     Marisa Nunez is a 78 y.o. female with a hx of dementia, PAF, breast CA, HTN, CVA, COPD with prior tobacco use, cardiomyopathy, and severe MR who is being seen after recent hospitalization.    Ms. Marisa Nunez was previously followed by Dr. Debroah Loop in [place], [place] primarily for paroxysmal atrial fibrillation. She moved to [facility] in 2022. Prior Echos in 06/2018 and 10/2020 showed LVEF of 55-60% and grade 1 diastolic dysfunction. She was initially seen by our team during admission from 11/26/2021 to 11/28/2021 for acute CHF and atrial fibrillation with RVR after presenting with shortness of breath and lower extremity edema. Echo showed LVEF of 40-45% with global hypokinesis, mildly reduced RV systolic function, and moderate MR. She was diuresed with IV Lasix with good urinary response and then transitioned to PO Lasix 40mg  daily. Home Toprol-XL was increased for better heart rate control and home and she was continued on home Losartan. She was not started on SGLT2 inhibitor due to frequent UTIs. In follow up cardiac PET was ordered due to drop in EF although it does not appear this was ever performed.    She underwent DCCV 01/2022 and was seen by the AF Clinic 02/2022 and was back in AF by that time. She was noted to be on Seroquel which was not found to be compatible with many antiarrhythmics therefore plan was for rate control. She has not been seen by cardiology since then.    She then presented to the ED and was admitted by teaching service given worsening SOB found to have AF with RVR and incidentally have UTI, hyponatremia. She was treated with antibiotics and diuretics. Echocardiogram performed which showed Ef at 45-50% (stable), moderate to severe MR and severe TR which had worsened from prior echo imaging 11/2021. Cardiology was not consulted and the patient was discharged 05/25/23.    Her daughter called the  office for an OV out of concern for the patients breathing. She is here today and is tripod breathing during our visit. Daughter reports this has been constant since discharge. She has symptoms of orthopnea but no overt fluid volume overload on immediate exam. Sounds like there was a discussion in the hospital about valve referral but based on her appearance today, she likely will not be able to wait that long. The patient denies chest pain, palpitations, LE edema, dizziness, or syncope. Denies bleeding in stool or urine. She is quite SOB even at rest. Given my concern, plan for hospital admission discussed and the patient and family are in agreement.        Past Medical History:  Diagnosis Date   Anxiety     Breast cancer (HCC)     Chronic combined systolic and diastolic CHF (congestive heart failure) (HCC)      a. Echo 11/2021: LVEF of 40-45% with global hypokinesis, mildly reduced RV systolic function, and moderate MR   COPD (chronic obstructive pulmonary disease) (HCC)     Dementia (HCC)     Depression     History of TIA 10/30/2020   History of tobacco use 05/06/2017    Formatting of this note might be different from the original.  Greater than 40 pack year history. Quit in 2015.     HTN (hypertension)     Paroxysmal atrial fibrillation (HCC) 2017   Stroke [facility])  Past Surgical History:  Procedure Laterality Date   ABDOMINAL HYSTERECTOMY       CARDIOVERSION N/A 02/19/2022    Procedure: CARDIOVERSION;  Surgeon: Meriam Sprague, MD;  Location: [facility] ENDOSCOPY;  Service: Cardiovascular;  Laterality: N/A;   REVERSE SHOULDER ARTHROPLASTY Right 10/21/2021    Procedure: RIGHT REVERSE TOTAL SHOULDER ARTHROPLASTY;  Surgeon: Bjorn Pippin, MD;  Location: [facility] OR;  Service: Orthopedics;  Laterality: Right;  allen bed, spider, tornier (rep aware), open shoulder tray.          Current Medications: Active Medications      Current Meds  Medication Sig   acetaminophen (TYLENOL)  500 MG tablet Take 1 tablet (500 mg total) by mouth every 6 (six) hours as needed for moderate pain, fever or mild pain.   albuterol (PROVENTIL) (2.5 MG/3ML) 0.083% nebulizer solution Take 3 mLs (2.5 mg total) by nebulization every 4 (four) hours as needed for shortness of breath or wheezing.   albuterol (VENTOLIN HFA) 108 (90 Base) MCG/ACT inhaler Inhale 1-2 puffs into the lungs every 6 (six) hours as needed.   benzonatate (TESSALON) 100 MG capsule Take 1 capsule (100 mg total) by mouth every 8 (eight) hours.   busPIRone (BUSPAR) 5 MG tablet Take 1 tablet (5 mg total) by mouth 2 (two) times daily.   citalopram (CELEXA) 20 MG tablet Take 1 tablet (20 mg total) by mouth daily.   divalproex (DEPAKOTE SPRINKLE) 125 MG capsule Take 2 capsules (250 mg total) by mouth 2 (two) times daily.   [Paused] furosemide (LASIX) 20 MG tablet Take 2 tablets (40 mg total) by mouth daily.   Melatonin 10 MG TABS Take 10 mg by mouth at bedtime.   memantine (NAMENDA) 10 MG tablet Take 1 tablet (10 mg total) by mouth 2 (two) times daily.   metoprolol succinate (TOPROL-XL) 50 MG 24 hr tablet Take 1 tablet (50 mg total) by mouth daily. Take with or immediately following a meal.   Multiple Vitamins-Minerals (MULTIVITAMIN WITH MINERALS) tablet Take 1 tablet by mouth daily with breakfast.   ondansetron (ZOFRAN-ODT) 4 MG disintegrating tablet Take 4 mg by mouth every 8 (eight) hours as needed for vomiting or nausea (dissolve orally).   QUEtiapine (SEROQUEL) 50 MG tablet Take 1.5 tablets (75 mg total) by mouth at bedtime.   rivaroxaban (XARELTO) 20 MG TABS tablet Take 1 tablet (20 mg total) by mouth daily with supper. Do not take with Eliquis        Allergies:   Influenza virus vaccine, Myrbetriq [mirabegron er], Pneumococcal vaccine, and Ativan [lorazepam]    Social History         Socioeconomic History   Marital status: Divorced      Spouse name: Not on file   Number of children: Not on file   Years of education: Not  on file   Highest education level: Not on file  Occupational History   Not on file  Tobacco Use   Smoking status: Former      Current packs/day: 0.50      Types: Cigarettes   Smokeless tobacco: Not on file  Vaping Use   Vaping status: Never Used  Substance and Sexual Activity   Alcohol use: Not Currently      Alcohol/week: 6.0 standard drinks of alcohol      Types: 6 Cans of beer per week   Drug use: Not Currently   Sexual activity: Not Currently  Other Topics Concern   Not on file  Social History Narrative  Not on file    Social Drivers of Health        Financial Resource Strain: Low Risk  (01/28/2023)    Overall Financial Resource Strain (CARDIA)     Difficulty of Paying Living Expenses: Not hard at all  Food Insecurity: No Food Insecurity (05/15/2023)    Hunger Vital Sign     Worried About Running Out of Food in the Last Year: Never true     Ran Out of Food in the Last Year: Never true  Transportation Needs: No Transportation Needs (05/15/2023)    PRAPARE - Therapist, art (Medical): No     Lack of Transportation (Non-Medical): No  Physical Activity: Sufficiently Active (01/28/2023)    Exercise Vital Sign     Days of Exercise per Week: 7 days     Minutes of Exercise per Session: 50 min  Stress: No Stress Concern Present (01/28/2023)    [employer] of Occupational Health - Occupational Stress Questionnaire     Feeling of Stress : Not at all  Social Connections: Unknown (05/15/2023)    Social Connection and Isolation Panel [NHANES]     Frequency of Communication with Friends and Family: Three times a week     Frequency of Social Gatherings with Friends and Family: More than three times a week     Attends Religious Services: Never     Database administrator or Organizations: Patient declined     Attends Banker Meetings: Patient declined     Marital Status: Divorced    Family History: The patient's family history includes  Arrhythmia in her father.   ROS:   Please see the history of present illness.     All other systems reviewed and are negative.   EKGs/Labs/Other Studies Reviewed:     The following studies were reviewed today:     Echocardiogram 05/09/23:   1. Left ventricular ejection fraction, by estimation, is 45 to 50%. The  left ventricle has mildly decreased function. Left ventricular diastolic  parameters are indeterminate.   2. Right ventricular systolic function is normal. The right ventricular  size is normal.   3. Left atrial size was moderately dilated.   4. Right atrial size was moderately dilated.   5. Moderate to severe mitral valve regurgitation.   6. Tricuspid valve regurgitation is severe.   7. The aortic valve is tricuspid. Aortic valve regurgitation is not  visualized.   8. The inferior vena cava is normal in size with greater than 50%  respiratory variability, suggesting right atrial pressure of 3 mmHg.    Recent Labs: 05/08/2023: B Natriuretic Peptide 1,212.3 05/09/2023: TSH 0.704 05/10/2023: Magnesium 1.8 05/14/2023: ALT 20 05/25/2023: BUN 23; Creatinine, Ser 0.73; Hemoglobin 12.0; Platelets 333; Potassium 4.2; Sodium 131  Recent Lipid Panel Labs (Brief)          Component Value Date/Time    CHOL 202 (H) 10/30/2020 0015    TRIG 70 10/30/2020 0015    HDL 88 10/30/2020 0015    CHOLHDL 2.3 10/30/2020 0015    VLDL 14 10/30/2020 0015    LDLCALC 100 (H) 10/30/2020 0015      Risk Assessment/Calculations:     CHA2DS2-VASc Score = 8   This indicates a 10.8% annual risk of stroke. The patient's score is based upon: CHF History: 1 HTN History: 1 Diabetes History: 0 Stroke History: 2 Vascular Disease History: 1 Age Score: 2 Gender Score: 1  Physical Exam:     VS:  BP 98/60   Pulse 88   Ht 5\' 4"  (1.626 m)   Wt 114 lb (51.7 kg)   LMP  (LMP Unknown)   SpO2 95%   BMI 19.57 kg/m         Wt Readings from Last 3 Encounters:  05/27/23 114 lb (51.7 kg)   05/27/23 114 lb (51.7 kg)  05/06/23 115 lb (52.2 kg)    General: Ill appearing, NAD Lungs:Clear to ausculation bilaterally although diminished in the LLL.  No wheezes, rales, or rhonchi. Breathing is labored. Cardiovascular: Irregular. + systolic murmur over mid axillary line. Abdomen: Soft, non-tender, non-distended. No obvious abdominal masses. Extremities: No edema.  Neuro: Alert and oriented. No focal deficits. No facial asymmetry. MAE spontaneously. Psych: Responds to questions appropriately with normal affect.     ASSESSMENT/PLAN:     Acute systolic CHF: Likely secondary to severe MR/TR. Discussed plan with cardiology team with plans for [facility] admission and cardiology consultation. Likely will need additional IV diuretics. Plan TEE and [facility] for further CAD and MR evaluation.    Severe MR/TR: Now with worsening MR/TR on recent echo with symptoms of acute HF with NYHA class IV symptoms of resting SOB likely driven by worsening valvular disease. Plan to admit and follow with cardiology for MR workup with TEE and R/LHC.Marland Kitchen Diuresis as tolerated. Follow [facility]HC hemodynamics. Likely not a surgical candidate with advanced dementia. May consider mTEER.    Cardiomyopathy: Unclear if this is ischemic in etiology with no prior workup. Previous OP plan for cardiac PET however this was never performed. Given new worsening MR/TR. Will plan TEE and R/LHC while admitted. Unable to titrate GDMT with hypotension.    Persistent AF: Previous DCCV with brief NSR. Last evaluation with AF Clinic plan was for rate control at that time. Presented recently with AF with RVR. Continue Xarelto. Unable to add antiarrythmic with Buspar and depakote. No palpitations reported today.    HTN: Although quite hypotensive today limiting GDMT and beta blockers. Plan admission as above.    Hyponatremia: Na+ at discharge noted at 131. Follow closely after admission   Signed, Georgie Chard, NP  05/27/2023 2:33 PM     [facility]  I have personally seen and examined this patient. I agree with the assessment and plan as outlined above.  Pt seen in our office today by Georgie Chard, NP.  78 yo female with history of PAF, dementia, breast cancer, CVA, COPD, cardiomyopathy and moderate to severe MR with worsened dyspnea and evidence of volume overload. Echo 05/09/23 with LVEF=45-50%. Moderate to severe MR. Severe TR. She was admitted last week to the IM service with dyspnea and found to have rapid atrial fib and UTI and had behavioral issues. She was not discharged on Lasix. She was discharged on Xarelto and Toprol.  Today with worsened dyspnea.  Labs reviewed by me. Renal function is normal. Troponin negative. BNP 1131.  EKG reviewed by me shows atrial fib with rate 95 bpm Chest x-ray reviewed by me shows no active cardiopulmonary disease Telemetry  My exam: Thin, cachectic female. She is tachypneic. LK:GMWNU irreg tachy Ext: No LE edema  Plan:  1.Acute on chronic systolic CHF: No obvious fluid overload on exam. BNP elevated. Patient visibly dyspneic. Agree with IV lasix tonight  2. Atrial fibrillation. Rate 90-110 bpm. Would continue Eliquis tonight. Continue Toprol. I would use IV Lopressor as needed for rate control tonight.   3. 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 Given her dementia, she may not be a candidate for TEE or cardiac cath. It would be best if we were able to see clinical improvement with diuresis and rate control alone. I am not sure that she is a candidate for mitral valve surgery or percutaneous mitral valve procedure given her dementia.   Verne Carrow, MD, [facility] 05/27/2023 7:01 PM

## 2023-05-27 NOTE — H&P (Signed)
Date: 05/27/2023               Patient Name:  Marisa Nunez MRN: [id number]  DOB: Nov 22, 1945 Age / Sex: 78 y.o., female   PCP: Mercie Eon, MD         Medical Service: Internal Medicine Teaching Service         Attending Physician: Dr. Reymundo Poll, MD      First Contact: Dr. Monna Fam, MD Pager [phone]    Second Contact: Dr. Rana Snare, DO Pager [phone]         After Hours (After 5p/  First Contact Pager: [phone]  weekends / holidays): Second Contact Pager: [phone]   SUBJECTIVE   Chief Complaint: Dyspnea   History of Present Illness:   This is a 78 year old female with advanced dementia, behavioral disturbances, atrial fibrillation on xarelto, HFrEF (LV EF 45-50% 05/09/23) PAF, breast CA, HTN, CVA, COPD with prior tobacco use, cardiomyopathy, and severe MR who presented to ED after cardiology OV (1/29) for concerns of worsening dyspnea.   Patient is evaluated at bedside, history is limited, patient received IV haldol for disruptive behavior. She reported feeling well, denied any current chest pain or dyspnea. Denied any LE swelling b/l.   Rest of the history is obtained from the daughter, [name] over the call.   Daughter reports that the patient went to see her cardiologist (Dr. Julien Girt) today (1/29) who went over her cardiac history and recent ECHO. During the OV, she had symptoms of increased work of breathing, increased respiration, and breathing with pursed lips. Daughter reports since the hospital discharge, she has dyspnea at rest and exertion. She has been sleeping a lot more during the day and appears fatigued. States that she sleeps with two pillows. Denies any symptoms of F/C/N/V/Abd pain/urinary symptoms.    ED Course: Cardiology consulted IV Lasix 40 mg  Seroquel 75 mg  IV lopressor 5 mg IV Haldol 5 mg   Past Medical History: Past Medical History:  Diagnosis Date   Anxiety    Breast cancer (HCC)    Chronic combined systolic and diastolic  CHF (congestive heart failure) (HCC)    a. Echo 11/2021: LVEF of 40-45% with global hypokinesis, mildly reduced RV systolic function, and moderate MR   COPD (chronic obstructive pulmonary disease) (HCC)    Dementia (HCC)    Depression    History of TIA 10/30/2020   History of tobacco use 05/06/2017   Formatting of this note might be different from the original.  Greater than 40 pack year history. Quit in 2015.     HTN (hypertension)    Paroxysmal atrial fibrillation (HCC) 2017   Stroke [facility])     Meds:   Current Outpatient Medications  Medication Instructions   acetaminophen (TYLENOL) 500 mg, Oral, Every 6 hours PRN   albuterol (PROVENTIL) 2.5 mg, Nebulization, Every 4 hours PRN   albuterol (VENTOLIN HFA) 108 (90 Base) MCG/ACT inhaler 1-2 puffs, Inhalation, Every 6 hours PRN   benzonatate (TESSALON) 100 mg, Oral, Every 8 hours   busPIRone (BUSPAR) 5 mg, Oral, 2 times daily   citalopram (CELEXA) 20 mg, Oral, Daily   divalproex (DEPAKOTE SPRINKLE) 250 mg, Oral, 2 times daily   [Paused] furosemide (LASIX) 40 mg, Oral, Daily   Melatonin 10 mg, Daily at bedtime   memantine (NAMENDA) 10 mg, Oral, 2 times daily   metoprolol succinate (TOPROL-XL) 50 mg, Oral, Daily, Take with or immediately following a meal.   Multiple Vitamins-Minerals (MULTIVITAMIN WITH  MINERALS) tablet 1 tablet, Daily with breakfast   ondansetron (ZOFRAN-ODT) 4 mg, Every 8 hours PRN   QUEtiapine (SEROQUEL) 75 mg, Oral, Daily at bedtime   rivaroxaban (XARELTO) 20 mg, Oral, Daily with supper, Do not take with Eliquis    Past Surgical History:  Procedure Laterality Date   ABDOMINAL HYSTERECTOMY     CARDIOVERSION N/A 02/19/2022   Procedure: CARDIOVERSION;  Surgeon: Meriam Sprague, MD;  Location: [facility] ENDOSCOPY;  Service: Cardiovascular;  Laterality: N/A;   REVERSE SHOULDER ARTHROPLASTY Right 10/21/2021   Procedure: RIGHT REVERSE TOTAL SHOULDER ARTHROPLASTY;  Surgeon: Bjorn Pippin, MD;  Location: [facility] OR;  Service:  Orthopedics;  Laterality: Right;  allen bed, spider, tornier (rep aware), open shoulder tray.    Social:  Living Situation: Patient lives with her daughter, son in law, and grandson  PCP: Mercie Eon, MD Tobacco: None  Alcohol: None  Drugs: None   Family History: noncontributory  Allergies: Allergies as of 05/27/2023 - Review Complete 05/27/2023  Allergen Reaction Noted   Influenza virus vaccine Hives 02/03/2017   Myrbetriq [mirabegron er] Other (See Comments) 06/13/2021   Pneumococcal vaccine Hives 02/03/2017   Ativan [lorazepam] Other (See Comments) 10/17/2021    Review of Systems: A complete ROS was negative except as per HPI.   OBJECTIVE:   Physical Exam: Blood pressure 113/73, pulse (!) 107, temperature 98.7 F (37.1 C), temperature source Oral, resp. rate (!) 24, height 5\' 4"  (1.626 m), weight 51.7 kg, SpO2 100%.   Constitutional: Laying in bed, no acute distress  Cardiovascular: Irregular rate and rhythm. No LE edema bilaterally  Pulmonary/Chest: normal work of breathing on room air, lungs clear to auscultation bilaterally Neurological: alert & oriented x 3, no focal deficits  Skin: warm and dry Psych: Flat mood and affect   Labs: CBC    Component Value Date/Time   WBC 11.0 (H) 05/27/2023 1438   RBC 4.54 05/27/2023 1438   HGB 12.3 05/27/2023 1438   HCT 38.7 05/27/2023 1438   PLT 368 05/27/2023 1438   MCV 85.2 05/27/2023 1438   MCH 27.1 05/27/2023 1438   MCHC 31.8 05/27/2023 1438   RDW 15.2 05/27/2023 1438   LYMPHSABS 1.2 05/14/2023 2026   MONOABS 1.4 (H) 05/14/2023 2026   EOSABS 0.1 05/14/2023 2026   BASOSABS 0.1 05/14/2023 2026     CMP     Component Value Date/Time   NA 139 05/27/2023 1438   NA 131 (L) 06/26/2022 1456   K 3.9 05/27/2023 1438   CL 98 05/27/2023 1438   CO2 29 05/27/2023 1438   GLUCOSE 63 (L) 05/27/2023 1438   BUN 11 05/27/2023 1438   BUN 13 06/26/2022 1456   CREATININE 0.76 05/27/2023 1438   CALCIUM 8.7 (L) 05/27/2023 1438    PROT 6.2 (L) 05/14/2023 2026   ALBUMIN 3.3 (L) 05/14/2023 2026   AST 17 05/14/2023 2026   ALT 20 05/14/2023 2026   ALKPHOS 89 05/14/2023 2026   BILITOT 0.6 05/14/2023 2026   GFRNONAA >60 05/27/2023 1438    Imaging: DG Chest 2 View Result Date: 05/27/2023 CLINICAL DATA:  Shortness of breath EXAM: CHEST - 2 VIEW COMPARISON:  Chest radiograph dated 05/14/2023. FINDINGS: No focal consolidation, pleural effusion, or pneumothorax. The cardiac silhouette is within normal limits. Atherosclerotic calcification of the aorta. No acute osseous pathology. Degenerative changes of the spine and scoliosis. Right shoulder arthroplasty. IMPRESSION: No active cardiopulmonary disease. Electronically Signed   By: Elgie Collard M.D.   On: 05/27/2023 16:00   DG  Chest Portable 1 View Result Date: 05/14/2023 CLINICAL DATA:  dyspnea EXAM: PORTABLE CHEST 1 VIEW COMPARISON:  Chest x-ray 05/08/2023 FINDINGS: The heart and mediastinal contours are unchanged. Atherosclerotic plaque. No focal consolidation. No pulmonary edema. No pleural effusion. No pneumothorax. No acute osseous abnormality. Total reversed right shoulder arthroplasty. IMPRESSION: No active disease. Electronically Signed   By: Tish Frederickson M.D.   On: 05/14/2023 22:55   CT HEAD WO CONTRAST Result Date: 05/14/2023 CLINICAL DATA:  Neuro deficit, acute, stroke suspected EXAM: CT HEAD WITHOUT CONTRAST TECHNIQUE: Contiguous axial images were obtained from the base of the skull through the vertex without intravenous contrast. RADIATION DOSE REDUCTION: This exam was performed according to the departmental dose-optimization program which includes automated exposure control, adjustment of the mA and/or kV according to patient size and/or use of iterative reconstruction technique. COMPARISON:  Head CT 12/25/2022 FINDINGS: Brain: No intracranial hemorrhage, mass effect, or midline shift. Stable degree of atrophy. No hydrocephalus. The basilar cisterns are patent. No  evidence of territorial infarct or acute ischemia. Mild periventricular and deep white matter hypodensity typical of chronic small vessel ischemia. No extra-axial or intracranial fluid collection. Vascular: Atherosclerosis of skullbase vasculature without hyperdense vessel or abnormal calcification. Skull: No fracture or focal lesion. Sinuses/Orbits: Paranasal sinuses and mastoid air cells are clear. The visualized orbits are unremarkable. Mild motion artifact. Other: None. IMPRESSION: 1. No acute intracranial abnormality. 2. Stable atrophy and chronic small vessel ischemia. Electronically Signed   By: Narda Rutherford M.D.   On: 05/14/2023 22:10   ECHOCARDIOGRAM COMPLETE Result Date: 05/09/2023    ECHOCARDIOGRAM REPORT   Patient Name:   AUNE ADAMI Date of Exam: 05/09/2023 Medical Rec #:  [id number]       Height:       64.0 in Accession #:    [id number]      Weight:       115.0 lb Date of Birth:  1945/12/27       BSA:          1.546 m Patient Age:    77 years        BP:           151/139 mmHg Patient Gender: F               HR:           81 bpm. Exam Location:  Inpatient Procedure: 2D Echo, Cardiac Doppler and Color Doppler Indications:    Congestive heart failure  History:        Patient has prior history of Echocardiogram examinations, most                 recent 11/27/2021. CHF; Signs/Symptoms:Alzheimer's. CVA.  Sonographer:    Amy Chionchio Referring Phys: [id number] GRACE LAU IMPRESSIONS  1. Left ventricular ejection fraction, by estimation, is 45 to 50%. The left ventricle has mildly decreased function. Left ventricular diastolic parameters are indeterminate.  2. Right ventricular systolic function is normal. The right ventricular size is normal.  3. Left atrial size was moderately dilated.  4. Right atrial size was moderately dilated.  5. Moderate to severe mitral valve regurgitation.  6. Tricuspid valve regurgitation is severe.  7. The aortic valve is tricuspid. Aortic valve regurgitation is not visualized.   8. The inferior vena cava is normal in size with greater than 50% respiratory variability, suggesting right atrial pressure of 3 mmHg. FINDINGS  Left Ventricle: Left ventricular ejection fraction, by estimation, is 45 to 50%. The left ventricle  has mildly decreased function. The left ventricular internal cavity size was normal in size. There is no left ventricular hypertrophy. Left ventricular diastolic parameters are indeterminate. Right Ventricle: The right ventricular size is normal. Right vetricular wall thickness was not assessed. Right ventricular systolic function is normal. Left Atrium: Left atrial size was moderately dilated. Right Atrium: Right atrial size was moderately dilated. Pericardium: There is no evidence of pericardial effusion. Mitral Valve: There is mild thickening of the mitral valve leaflet(s). Moderate to severe mitral valve regurgitation. MV peak gradient, 10.1 mmHg. The mean mitral valve gradient is 3.0 mmHg. Tricuspid Valve: The tricuspid valve is normal in structure. Tricuspid valve regurgitation is severe. Aortic Valve: The aortic valve is tricuspid. Aortic valve regurgitation is not visualized. Aortic valve mean gradient measures 10.7 mmHg. Aortic valve peak gradient measures 18.3 mmHg. Aortic valve area, by VTI measures 0.95 cm. Pulmonic Valve: The pulmonic valve was not well visualized. Pulmonic valve regurgitation is not visualized. No evidence of pulmonic stenosis. Aorta: The aortic root and ascending aorta are structurally normal, with no evidence of dilitation. Venous: The inferior vena cava is normal in size with greater than 50% respiratory variability, suggesting right atrial pressure of 3 mmHg. IAS/Shunts: No atrial level shunt detected by color flow Doppler.  LEFT VENTRICLE PLAX 2D LVIDd:         4.60 cm LVIDs:         4.00 cm LV PW:         1.10 cm LV IVS:        1.00 cm LVOT diam:     1.90 cm LV SV:         37 LV SV Index:   24 LVOT Area:     2.84 cm  LV Volumes (MOD) LV  vol d, MOD A2C: 114.0 ml LV vol d, MOD A4C: 105.0 ml LV vol s, MOD A2C: 57.4 ml LV vol s, MOD A4C: 58.7 ml LV SV MOD A2C:     56.6 ml LV SV MOD A4C:     105.0 ml LV SV MOD BP:      57.4 ml RIGHT VENTRICLE          IVC RV Basal diam:  4.00 cm  IVC diam: 2.40 cm RV Mid diam:    2.40 cm TAPSE (M-mode): 1.4 cm LEFT ATRIUM             Index        RIGHT ATRIUM           Index LA Vol (A2C):   73.2 ml 47.34 ml/m  RA Area:     19.30 cm LA Vol (A4C):   65.6 ml 42.42 ml/m  RA Volume:   62.00 ml  40.09 ml/m LA Biplane Vol: 71.6 ml 46.30 ml/m  AORTIC VALVE                     PULMONIC VALVE AV Area (Vmax):    1.01 cm      PV Vmax:       0.87 m/s AV Area (Vmean):   0.95 cm      PV Peak grad:  3.0 mmHg AV Area (VTI):     0.95 cm AV Vmax:           213.67 cm/s AV Vmean:          152.333 cm/s AV VTI:            0.386 m AV Peak Grad:  18.3 mmHg AV Mean Grad:      10.7 mmHg LVOT Vmax:         75.93 cm/s LVOT Vmean:        50.967 cm/s LVOT VTI:          0.129 m LVOT/AV VTI ratio: 0.34  AORTA Ao Root diam: 2.50 cm Ao Asc diam:  2.90 cm MITRAL VALVE                  TRICUSPID VALVE MV Area (PHT): 3.79 cm       TR Peak grad:   31.4 mmHg MV Area VTI:   1.12 cm       TR Vmax:        280.00 cm/s MV Peak grad:  10.1 mmHg MV Mean grad:  3.0 mmHg       SHUNTS MV Vmax:       1.59 m/s       Systemic VTI:  0.13 m MV Vmean:      72.2 cm/s      Systemic Diam: 1.90 cm MR Peak grad:    88.7 mmHg MR Mean grad:    59.0 mmHg MR Vmax:         471.00 cm/s MR Vmean:        362.0 cm/s MR PISA:         1.57 cm MR PISA Eff ROA: 13 mm MR PISA Radius:  0.50 cm Dietrich Pates MD Electronically signed by Dietrich Pates MD Signature Date/Time: 05/09/2023/4:24:23 PM    Final    DG Chest Portable 1 View Result Date: 05/08/2023 CLINICAL DATA:  Upper respiratory infection symptoms. Shortness of breath. EXAM: PORTABLE CHEST 1 VIEW COMPARISON:  Chest radiograph dated 05/07/2023. FINDINGS: Overall improved vascular congestion and edema compared to prior  radiograph. Small right pleural effusion and associated atelectasis or infiltrate the right lung base similar to prior radiograph. No pneumothorax. Stable cardiac silhouette. Atherosclerotic calcification of the aorta. No acute osseous pathology. Degenerative changes of the spine. Right shoulder arthroplasty. IMPRESSION: Improved CHF with similar small right pleural effusion. Electronically Signed   By: Elgie Collard M.D.   On: 05/08/2023 12:40   DG Chest 2 View Result Date: 05/07/2023 CLINICAL DATA:  Shortness of breath EXAM: CHEST - 2 VIEW COMPARISON:  01/12/2023 FINDINGS: Cardiac shadows within normal limits. Increased vascular congestion and edema is noted. Right basilar atelectasis and effusion is seen. No bony abnormality is noted. IMPRESSION: Changes consistent with congestive failure. Right effusion and basilar opacity is noted as well. Electronically Signed   By: Alcide Clever M.D.   On: 05/07/2023 00:28    DG chest 2 View IMPRESSION: No active cardiopulmonary disease.  EKG: personally reviewed my interpretation is Afib and LAFB compared to previous EKG.   ASSESSMENT & PLAN:   Assessment & Plan by Problem: Principal Problem:   Mitral valve regurgitation Active Problems:   Nonrheumatic mitral valve regurgitation   Marisa Nunez is a 78 y.o. female with advanced dementia, behavioral disturbances, atrial fibrillation on xarelto, HFrEF (LV EF 45-50% 05/09/23) PAF, breast CA, HTN, CVA, COPD with prior tobacco use, cardiomyopathy, and severe MR who presented to ED after cardiology OV (1/29) for concerns of worsening dyspnea.  Acute on Chronic HF exacerbation [LVEF=45-50%] Severe Mitral Regurgitation Severe Tricuspid regurgitation  Recently discharged on 1/27, reported worsening dyspnea at rest, fatigue, orthopnea. Seen in cardiologist OV with concerns of worsening dyspnea. Recent ECHO 05/09/23 showed LVEF 45-50% with severe MVR and TVR. Labs showed  BNP 1131.2, Troponin 10 >>11. CXR  negative for acute cardiopulmomary disease. During my evaluation, she appeared stable without apparent volume overload on exam, no crackles or rales during lung auscultation, no LE edema bilaterally, normal work of breathing (post-diuresis per EDP). Patient's overall symptoms like due to severe MR/TR. Cardiology saw patient in the ED, appreciate their recommendations. Cards considering that patient may not be a candidate for TEE or cardiac catheterization due to dementia. Current plan to optimize medical management with diuresis and rate control. EDP gave IV lasix 40 mg with good output and IV lopressor 5 mg.   Plan: - Follow Cardiology recommendations  *Diuresis and Rate control. Possible TEE this week, though may not be candidate due to advance dementia - IV lasix 40 mg daily (next dose tomorrow AM) - Strict Is & Os with daily weight checks  Atrial Fibrillation EKG showed irregular rhythm with rapid ventricular rate 124 bpm. Patient's home medication consist of Xarelto 20 mg daily and metoprolol succinate 50 mg daily. Patient denied any current chest pains or palpitations. Plan to continue home medications for rate control.  - Continue Xarelto 20 mg daily and metoprolol succinate 50 mg daily - Telemetry   Advance dementia/behavioral disturbances Has a hx of behavioral disturbances with worsening dementia. Daughter over the call notes the progression and aggressive behavior at times. Home medications are Buspar 5 mg BID, Celexa 20 mg daily, Depakote 250 mg BID, memantine 10 mg BID, Seroquel 75 mg at bedtime. She did receive IV Haldol in the ED for behavorial disturbance, currently resting.  - Continue home medications Buspar 5 mg BID, Celexa 20 mg daily, Depakote 250 mg BID, memantine 10 mg BID, Seroquel 75 mg at bedtime.      Diet: Heart Healthy VTE:  Xarelto  IVF: None,None Code: DNR/DNI  Prior to Admission Living Arrangement: Home, living Daughter Anticipated Discharge Location:  Home Barriers to Discharge: Medical Management   Dispo: Admit patient to Inpatient with expected length of stay greater than 2 midnights.  Signed: Jeral Pinch, DO Internal Medicine Resident PGY-1  05/27/2023, 8:51 PM

## 2023-05-28 DIAGNOSIS — I509 Heart failure, unspecified: Secondary | ICD-10-CM | POA: Diagnosis not present

## 2023-05-28 DIAGNOSIS — I340 Nonrheumatic mitral (valve) insufficiency: Secondary | ICD-10-CM | POA: Diagnosis not present

## 2023-05-28 LAB — BASIC METABOLIC PANEL
Anion gap: 12 (ref 5–15)
BUN: 14 mg/dL (ref 8–23)
CO2: 28 mmol/L (ref 22–32)
Calcium: 8.6 mg/dL — ABNORMAL LOW (ref 8.9–10.3)
Chloride: 97 mmol/L — ABNORMAL LOW (ref 98–111)
Creatinine, Ser: 0.82 mg/dL (ref 0.44–1.00)
GFR, Estimated: >60 mL/min (ref >60)
Glucose, Bld: 86 mg/dL (ref 70–99)
Potassium: 3.5 mmol/L (ref 3.5–5.1)
Sodium: 137 mmol/L (ref 135–145)

## 2023-05-28 LAB — MAGNESIUM: Magnesium: 1.7 mg/dL (ref 1.7–2.4)

## 2023-05-28 MED ORDER — FUROSEMIDE 10 MG/ML IJ SOLN
20.0000 mg | Freq: Once | INTRAMUSCULAR | Status: AC
  Administered 2023-05-28: 20 mg via INTRAVENOUS
  Filled 2023-05-28: qty 2

## 2023-05-28 MED ORDER — MAGNESIUM SULFATE 2 GM/50ML IV SOLN
2.0000 g | Freq: Once | INTRAVENOUS | Status: AC
  Administered 2023-05-28: 2 g via INTRAVENOUS
  Filled 2023-05-28: qty 50

## 2023-05-28 MED ORDER — POTASSIUM CHLORIDE CRYS ER 20 MEQ PO TBCR
40.0000 meq | EXTENDED_RELEASE_TABLET | Freq: Once | ORAL | Status: AC
  Administered 2023-05-28: 40 meq via ORAL
  Filled 2023-05-28: qty 2

## 2023-05-28 MED ORDER — FUROSEMIDE 10 MG/ML IJ SOLN
60.0000 mg | Freq: Every day | INTRAMUSCULAR | Status: DC
  Administered 2023-05-29: 60 mg via INTRAVENOUS
  Filled 2023-05-28: qty 6

## 2023-05-28 MED ORDER — FUROSEMIDE 10 MG/ML IJ SOLN
40.0000 mg | Freq: Once | INTRAMUSCULAR | Status: AC
  Administered 2023-05-28: 40 mg via INTRAVENOUS
  Filled 2023-05-28: qty 4

## 2023-05-28 MED ORDER — METOPROLOL SUCCINATE ER 50 MG PO TB24
75.0000 mg | ORAL_TABLET | Freq: Every day | ORAL | Status: DC
  Administered 2023-05-29 – 2023-06-04 (×7): 75 mg via ORAL
  Filled 2023-05-28 (×7): qty 1

## 2023-05-28 MED ORDER — QUETIAPINE FUMARATE 50 MG PO TABS
50.0000 mg | ORAL_TABLET | Freq: Every morning | ORAL | Status: DC
  Administered 2023-05-28 – 2023-06-04 (×8): 50 mg via ORAL
  Filled 2023-05-28 (×4): qty 1
  Filled 2023-05-28: qty 2
  Filled 2023-05-28 (×3): qty 1

## 2023-05-28 MED ORDER — QUETIAPINE FUMARATE 50 MG PO TABS
75.0000 mg | ORAL_TABLET | Freq: Every day | ORAL | Status: DC
  Administered 2023-05-28 – 2023-06-03 (×7): 75 mg via ORAL
  Filled 2023-05-28 (×8): qty 1

## 2023-05-28 NOTE — Progress Notes (Addendum)
Patient Name: Marisa Nunez Date of Encounter: 05/28/2023 [facility] Cardiologist: Parke Poisson, MD   Interval Summary  .    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  Cardiology is following for acute heart failure with mildly reduced EF secondary to severe MR/TR, TEE as well as right and left heart cath was recommended for further evaluation of potential MR intervention.   Patient has mild memory loss, is oriented to self and time, was not aware she is at [facility]. She clearly states she wants no surgery or invasive procedures of any kind. She felt better with lasix and has been urinating well. She states her daughter Marisa Nunez can be called for update.    Vital Signs .    Vitals:   05/28/23 0330 05/28/23 0345 05/28/23 0751 05/28/23 0929  BP: 130/79 (!) 136/91 133/88 (!) 135/105  Pulse: 75 79 (!) 102 (!) 138  Resp: 18 17 17    Temp:   98.1 F (36.7 C)   TempSrc:   Oral   SpO2: 100% 100% 100%   Weight:      Height:       No intake or output data in the 24 hours ending 05/28/23 0957    05/27/2023    2:12 PM 05/27/2023   10:26 AM 05/06/2023   11:59 PM  Last 3 Weights  Weight (lbs) 114 lb 114 lb 115 lb  Weight (kg) 51.71 kg 51.71 kg 52.164 kg      Telemetry/ECG    A fib RVR 130-140s  - Personally Reviewed  Physical Exam .   GEN: No acute distress.   Neck: ++ JVD Cardiac: Irregularly irregular, grade II systolic murmur at apex Respiratory: Clear to auscultation bilaterally. On 3L [place] oxygen  GI: Soft, nontender, non-distended  MS: No leg edema  Assessment & Plan .     Acute heart failure with mildly reduced EF Severe mitral regurgitation Severe tricuspid regurgitation -Sent in from structural  heart office to hospital for admission due to acute heart failure 05/27/2023, BNP 1131, CXR no acute finding POA. She has been started on IV Lasix 40 mg daily, on PTA Lasix 40mg  daily,  no track of intake and output or weight yet, lab with stable renal index, will continue IV Lasix but increase to 60mg  daily today -Per structural heart team recommendation:possible TEE, left and right heart cath this admission for further evaluation of MR intervention; although with her dementia, she was felt possibly not a great candidate for invasive procedures or surgery  - After discussing with the patient, she expressed clear wish that she does not want to invasive procedure or surgery for her MR, she wishes medical therapy for CHF symptoms management. Called her daughter Marisa Nunez (per patient's permission) for further discussion today, she states that patient has dementia and significant memory issue, daughter is making decision for the patient at this time and family had already decided that patient will have surgery or procedure for her MR if this is clinically indicated, she understand the limitation of her dementia which can be a barrier for peri-operative management and wishes MR work up to be done. Encouraged the daughter to discuss with the patient further because our team would like to avoid forcing the patient with any procedure /workup despite  her memory deficit.  -GDMT: Continue Toprol-XL 50 mg daily, may add Entresto 24/26 BID today   Persistent atrial fibrillation -Failed DCCV in the past, goal is rate control at this time -PTA Xarelto 20 mg daily and metoprolol XL 50 mg daily had been continued - tachycardiac today up to 140s, received IV lopressor, will increase toprolol XL to 75mg  daily      For questions or updates, please contact  [facility] Please consult [email or web address] for contact info under        Signed, Cyndi Bender, NP

## 2023-05-28 NOTE — Progress Notes (Addendum)
                  Subjective:   Summary: Patient is a 78 year old woman with dementia with behavioral disturbances, atrial fibrillation on Xarelto, HFrEF w/EF 45-50%, severe mitral regurgitation, CVA, COPD presenting with dyspnea, admitted for CHF exacerbation.   Patient was irritable this morning and did not wish to speak with the rounding team. Refused physical exam, denied any symptoms.   Objective:  Vital signs in last 24 hours: Vitals:   05/28/23 0345 05/28/23 0751 05/28/23 0929 05/28/23 1026  BP: (!) 136/91 133/88 (!) 135/105 (!) 111/92  Pulse: 79 (!) 102 (!) 138 (!) 122  Resp: 17 17  (!) 29  Temp:  98.1 F (36.7 C)  98.5 F (36.9 C)  TempSrc:  Oral  Oral  SpO2: 100% 100%  99%  Weight:      Height:       Supplemental O2: Room Air SpO2: 99 % O2 Flow Rate (L/min): 2 L/min  Physical Exam:  Constitutional: elderly cachexic woman, in no acute distress Cardiovascular: patient refused Pulmonary/Chest: refused Abdominal: refused Neuro: no focal deficit noted  Assessment/Plan:   Acute heart failure with mildly reduced EF exacerbation Severe mitral regurgitation Severe tricuspid regurgitation Patient presents from cardiology office with complaints of dyspnea. BNP 1131, troponin 10 -> 11. Does not appear overtly volume overloaded however patient refused full physical exam. Cardiology consulted. Patient does not want to have invasive procedures performed, however per daughter who is POA she would like to go forward with further testing/procedures.  - s/p IV Lasix 60 , continue IV Lasix 60mg  daily - TEE planned tentatively for Monday  Atrial fibrillation with RVR, now rate controlled EKG in the ED showing atrial fibrillation with RVR to 120s, currently rate controlled. Denies chest pain or palpitations. Cardiology consulted, appreciate recommendations. Per cardiology, goal is rate control.  - Continue home Xarelto 20mg , increase metoprolol XL to 75mg  daily - on  telemetry  Dementia with behavioral disturbance Continue home seroquel 50mg  in the am, seroquel 75mg  at bedtime, memantine 10mg  daily, melatonin 3mg  qus, buspirone 5mg  BID, citalopram 20mg  daily, Depakote 250mg  BID  Diet: Heart healthy VTE: Xarelto Code: DNR  Dispo: Anticipated discharge to home vs SNF pending PT evaluation  Monna Fam, MD PGY-1 Internal Medicine Resident Pager Number [phone] Please contact the on call pager after 5 pm and on weekends at [phone].

## 2023-05-28 NOTE — Progress Notes (Signed)
Heart Failure Navigator Progress Note  Assessed for Heart & Vascular TOC clinic readiness.  Patient does not meet criteria due to per MD note patient with history of Dementia. .   Navigator will sign off at this time.   Rhae Hammock, BSN, Scientist, clinical (histocompatibility and immunogenetics) Only

## 2023-05-28 NOTE — Care Management (Signed)
Transition of Care [facility]) - Inpatient Brief Assessment   Patient Details  Name: Marisa Nunez MRN: [id number] Date of Birth: [birth year]  Transition of Care [facility]) CM/SW Contact:    Lockie Pares, RN Phone Number: 05/28/2023, 3:41 PM   Clinical Narrative: High readmission risk: just discharged 3 days ago 78 year old patient lives with daughter grandson in apartment. She received RW upon DC 3 days ago and daughter who is Management consultant, declined home health services.She received RW from [employer]. At that time a referral was made to Care patrol, Duwayne Heck The patient has been resistant to care, but daughter would like aggressive treatments for now. TOC will continue to follow for needs, recommendations, and transitions of care   Transition of Care Asessment: Insurance and Status: Insurance coverage has been reviewed Patient has primary care physician: Yes Home environment has been reviewed: Lives with daughter, Apartment Prior level of function:: Museum/gallery curator Home Services: No current home services Social Drivers of Health Review: SDOH reviewed no interventions necessary Readmission risk has been reviewed: Yes Transition of care needs: transition of care needs identified, TOC will continue to follow

## 2023-05-29 DIAGNOSIS — I340 Nonrheumatic mitral (valve) insufficiency: Secondary | ICD-10-CM | POA: Diagnosis not present

## 2023-05-29 DIAGNOSIS — I509 Heart failure, unspecified: Secondary | ICD-10-CM | POA: Diagnosis not present

## 2023-05-29 LAB — BASIC METABOLIC PANEL
Anion gap: 8 (ref 5–15)
BUN: 17 mg/dL (ref 8–23)
CO2: 27 mmol/L (ref 22–32)
Calcium: 8.5 mg/dL — ABNORMAL LOW (ref 8.9–10.3)
Chloride: 98 mmol/L (ref 98–111)
Creatinine, Ser: 0.88 mg/dL (ref 0.44–1.00)
GFR, Estimated: >60 mL/min (ref >60)
Glucose, Bld: 103 mg/dL — ABNORMAL HIGH (ref 70–99)
Potassium: 4.2 mmol/L (ref 3.5–5.1)
Sodium: 133 mmol/L — ABNORMAL LOW (ref 135–145)

## 2023-05-29 LAB — MAGNESIUM: Magnesium: 2.1 mg/dL (ref 1.7–2.4)

## 2023-05-29 MED ORDER — LIDOCAINE 5 % EX PTCH
1.0000 | MEDICATED_PATCH | CUTANEOUS | Status: DC
  Administered 2023-05-29 – 2023-06-03 (×6): 1 via TRANSDERMAL
  Filled 2023-05-29 (×6): qty 1

## 2023-05-29 MED ORDER — OLANZAPINE 2.5 MG PO TABS
2.5000 mg | ORAL_TABLET | Freq: Every day | ORAL | Status: DC | PRN
  Administered 2023-05-29 – 2023-06-03 (×5): 2.5 mg via ORAL
  Filled 2023-05-29 (×6): qty 1

## 2023-05-29 MED ORDER — FUROSEMIDE 10 MG/ML IJ SOLN
60.0000 mg | Freq: Two times a day (BID) | INTRAMUSCULAR | Status: DC
  Administered 2023-05-29 – 2023-05-31 (×4): 60 mg via INTRAVENOUS
  Filled 2023-05-29 (×4): qty 6

## 2023-05-29 MED ORDER — ACETAMINOPHEN 325 MG PO TABS
650.0000 mg | ORAL_TABLET | Freq: Four times a day (QID) | ORAL | Status: DC | PRN
  Administered 2023-05-29 – 2023-06-04 (×12): 650 mg via ORAL
  Filled 2023-05-29 (×11): qty 2

## 2023-05-29 NOTE — Progress Notes (Signed)
                  Subjective:   Summary: Patient is a 78 year old woman with dementia with behavioral disturbances, atrial fibrillation on Xarelto, HFrEF w/EF 45-50%, severe mitral regurgitation, CVA, COPD presenting with dyspnea, admitted for CHF exacerbation.   Patient was calm and willing to participate in interview today. No complaints, answers in single words. Denies any new symptoms, chest pain, or shortness of breath.   Objective:  Vital signs in last 24 hours: Vitals:   05/29/23 0554 05/29/23 0821 05/29/23 0823 05/29/23 1229  BP:  (!) 146/111 (!) 165/103 107/89  Pulse:  76 92 89  Resp:  18  (!) 21  Temp:  97.9 F (36.6 C)    TempSrc:  Oral  Oral  SpO2:  97% 97% 100%  Weight: 51 kg     Height:       Supplemental O2: Room Air SpO2: 100 % O2 Flow Rate (L/min): 2 L/min  Physical Exam:  Constitutional: elderly cachexic woman, in no acute distress Cardiovascular: irregular rhythm, systolic murmur Pulmonary/Chest: CTAB, no murmurs Abdominal: soft, nontender Neuro: no focal deficit noted  Assessment/Plan:   Acute heart failure with mildly reduced EF exacerbation Severe mitral regurgitation Severe tricuspid regurgitation Patient not overtly volume overloaded on exam, no supplemental oxygen requirements. Minimal urine output reported, however this may have been due to patient not participating in strict Is&Os. Appreciate cardiology team recommendations.  - per cardiology, IV Lasix 60mg  BID - TEE planned tentatively for Monday  Atrial fibrillation with RVR, now rate controlled Heart rate remains rate controlled, still in atrial fibrillation.  - Continue home Xarelto 20mg , metoprolol XL 75mg  daily - on telemetry  Dementia with behavioral disturbance Continue home seroquel 50mg  in the am, seroquel 75mg  at bedtime, memantine 10mg  daily, melatonin 3mg  qus, buspirone 5mg  BID, citalopram 20mg  daily, Depakote 250mg  BID  Diet: Heart healthy VTE: Xarelto Code: DNR  Dispo:  Anticipated discharge to home vs SNF pending PT evaluation  Monna Fam, MD PGY-1 Internal Medicine Resident Pager Number [phone] Please contact the on call pager after 5 pm and on weekends at [phone].

## 2023-05-29 NOTE — Consult Note (Signed)
[facility] Liaison Consult Note   05/29/2023  Marisa Nunez 1945-12-21 [id number]  Insurance: [employer] [employer] Medicare   Primary Care Provider: Mercie Eon, MD, with [facility] Internal Medicine, this provider is listed for the transition of care follow up appointments  and [facility] calls   [facility] Liaison met patient at bedside at [facility]. Rounding to room and patient is working with therapy. However, patient noted with flat affect but following cues from therapist. No family currently at bedside. SDOH reviewed and noted intact.    The patient was screened for 7 and 30 day readmission hospitalization with noted high risk score for unplanned readmission risk 3 ED and 3 hospital admissions in 6 months.  The patient was assessed for potential Community Care Coordination service needs for post hospital transition for care coordination. Review of patient's electronic medical record reveals patient is from home with daughter per inpatient [facility] RNCM notes.   Plan: [facility] Liaison will continue to follow progress and disposition to asess for post hospital community care coordination needs.  Referral request for community care coordination: No disposition noted, review of inpatient [facility] RN was for [facility] Patrol to follow up.   VBCI Community Care, Population Health does not replace or interfere with any arrangements made by the Inpatient Transition of Care team.   For questions contact:   Charlesetta Shanks, RN, BSN, CCM [place]  [facility], [facility] Liaison Direct Dial: [phone] or secure chat Email: [email or web address]

## 2023-05-29 NOTE — Evaluation (Signed)
Occupational Therapy Evaluation Patient Details Name: Marisa Nunez MRN: [id number] DOB: July 25, 1945 Today's Date: 05/29/2023   History of Present Illness Pt is a 78 y/o female admitted due to a fib with RVR and acute CHF exacerbation. Admitted for potential surgical repair of MR/TR. Recent discharge 1/27. PMH of dementia, HFrEF (EF 45-50% 03/08/24) with new diagnosis of severe tricuspid regurgitation, HTN, HLD, AF on Xarelto   Clinical Impression   PTA, pt lives with daughter and grandson, typically ambulatory without AD and able to manage ADLs w/ intermittent assist for bathing. Pt presents now close to documented baseline. Pt able to manage full ADLs seated/standing at sink with intermittent cues for initiation and attention to task due to baseline dementia. Pt able to mobilize in room without AD and without LOB. As pt is close to baseline, feel pt could return home if consistent support able to be provided. Recommend assistance with meals and med mgmt; also recommend consideration of ALF vs memory care in the future if family unable to provide the assist pt needs. Will continue to follow acutely.      If plan is discharge home, recommend the following: A little help with bathing/dressing/bathroom;Direct supervision/assist for medications management;Direct supervision/assist for financial management;Assistance with cooking/housework;Assist for transportation;Supervision due to cognitive status    Functional Status Assessment  Patient has had a recent decline in their functional status and demonstrates the ability to make significant improvements in function in a reasonable and predictable amount of time. (near baseline)  Equipment Recommendations  None recommended by OT    Recommendations for Other Services       Precautions / Restrictions Precautions Precautions: Fall Precaution Comments: relatively low fall risk Restrictions Weight Bearing Restrictions Per Provider Order: No       Mobility Bed Mobility Overal bed mobility: Modified Independent                  Transfers Overall transfer level: Independent Equipment used: None Transfers: Sit to/from Stand Sit to Stand: Independent                  Balance Overall balance assessment: Needs assistance Sitting-balance support: Feet supported Sitting balance-Leahy Scale: Good     Standing balance support: No upper extremity supported, During functional activity Standing balance-Leahy Scale: Fair                             ADL either performed or assessed with clinical judgement   ADL Overall ADL's : Needs assistance/impaired Eating/Feeding: Independent;Sitting Eating/Feeding Details (indicate cue type and reason): initiated eating breakfast at end of session wo issues Grooming: Supervision/safety;Standing;Sitting;Wash/dry face;Oral care;Applying deodorant;Brushing hair Grooming Details (indicate cue type and reason): occasional cues to initiate/move on to another task but able to manage without physical assistance. NT present and OT/NT did assist with shower cap due to novelty of task. Upper Body Bathing: Supervision/ safety;Standing;Sitting Upper Body Bathing Details (indicate cue type and reason): cues for when to wash armpits as pt perseverating on being cold Lower Body Bathing: Supervison/ safety;Sitting/lateral leans;Sit to/from stand Lower Body Bathing Details (indicate cue type and reason): able to bathe peri region when cued/provided with washcloth. pt would hold out washcloth for OT to place soap on it Upper Body Dressing : Supervision/safety;Sitting;Standing   Lower Body Dressing: Supervision/safety;Sitting/lateral leans;Sit to/from stand Lower Body Dressing Details (indicate cue type and reason): pt able to manage donning socks with cues to bring feet to self (due to reporting  feet cold on floor), able to manage zipper on pants Toilet Transfer:  Supervision/safety;Ambulation   Toileting- Clothing Manipulation and Hygiene: Supervision/safety;Sitting/lateral lean;Sit to/from stand       Functional mobility during ADLs: Supervision/safety       Vision Ability to See in Adequate Light: 0 Adequate Patient Visual Report: No change from baseline Vision Assessment?: No apparent visual deficits     Perception         Praxis         Pertinent Vitals/Pain Pain Assessment Pain Assessment: No/denies pain     Extremity/Trunk Assessment Upper Extremity Assessment Upper Extremity Assessment: Overall WFL for tasks assessed;Right hand dominant   Lower Extremity Assessment Lower Extremity Assessment: Defer to PT evaluation   Cervical / Trunk Assessment Cervical / Trunk Assessment: Normal   Communication Communication Communication: No apparent difficulties Cueing Techniques: Verbal cues;Gestural cues   Cognition Arousal: Alert Behavior During Therapy: Flat affect Overall Cognitive Status: History of cognitive impairments - at baseline                                 General Comments: hx of dementia, flat affect but will respond to humor. follows one step commands easily but distractible. will perseverate on being cold during session. minor cues for sequencing ADLs but pt implementing some multistep ADLs without cues.     General Comments       Exercises     Shoulder Instructions      Home Living Family/patient expects to be discharged to:: Private residence Living Arrangements: Children;Other relatives (grandson) Available Help at Discharge: Family Type of Home: House Home Access: Stairs to enter [employer] of Steps: 4 Entrance Stairs-Rails: Right;Left;Can reach both Home Layout: One level     Bathroom Shower/Tub: Tub/shower unit;Walk-in shower   Bathroom Toilet: Standard     Home Equipment: Agricultural consultant (2 wheels)          Prior Functioning/Environment Prior Level of  Function : Needs assist             Mobility Comments: no DME for mobility. pt provide RW at last admission but reports she has not needed to use it at home ADLs Comments: Typically independent with ADLs, daughter will assist with bathing. Pt able to walk to kitchen to get a drink/snack        OT Problem List: Decreased cognition      OT Treatment/Interventions: Therapeutic exercise;Self-care/ADL training;DME and/or AE instruction;Balance training;Patient/family education;Therapeutic activities;Cognitive remediation/compensation    OT Goals(Current goals can be found in the care plan section) Acute Rehab OT Goals Patient Stated Goal: not be cold OT Goal Formulation: Patient unable to participate in goal setting Time For Goal Achievement: 06/12/23 Potential to Achieve Goals: Fair ADL Goals Pt Will Transfer to Toilet: Independently;ambulating Pt Will Perform Toileting - Clothing Manipulation and hygiene: Independently;sit to/from stand;sitting/lateral leans  OT Frequency: Min 1X/week    Co-evaluation              AM-PAC OT "6 Clicks" Daily Activity     Outcome Measure Help from another person eating meals?: None Help from another person taking care of personal grooming?: A Little Help from another person toileting, which includes using toliet, bedpan, or urinal?: A Little Help from another person bathing (including washing, rinsing, drying)?: A Little Help from another person to put on and taking off regular upper body clothing?: A Little Help from another person to put on  and taking off regular lower body clothing?: A Little 6 Click Score: 19   End of Session Nurse Communication: Mobility status  Activity Tolerance: Patient tolerated treatment well Patient left: in bed;with call bell/phone within reach;Other (comment) (safety sitter at bedside)  OT Visit Diagnosis: Other symptoms and signs involving cognitive function                Time: 1610-9604 OT Time  Calculation (min): 25 min Charges:  OT General Charges $OT Visit: 1 Visit OT Evaluation $OT Eval Low Complexity: 1 Low OT Treatments $Self Care/Home Management : 8-22 mins  Bradd Canary, OTR/L Acute Rehab Services Office: [phone]   Alissia Lory 05/29/2023, 11:02 AM

## 2023-05-29 NOTE — Plan of Care (Signed)

## 2023-05-29 NOTE — Progress Notes (Signed)
OT Cancellation Note  Patient Details Name: SAMIRA ACERO MRN: [id number] DOB: [birth year]   Cancelled Treatment:    Reason Eval/Treat Not Completed: Other (comment) Per nursing, pt has been up most of the night and finally resting now. Plan to follow up for OT eval later as schedule permits.  Zakaiya Lares 05/29/2023, 7:42 AM

## 2023-05-29 NOTE — Progress Notes (Signed)
Mobility Specialist Progress Note:   05/29/23 1145  Mobility  Activity Ambulated with assistance in hallway  Level of Assistance Contact guard assist, steadying assist  Assistive Device None  Distance Ambulated (ft) 200 ft  Activity Response Tolerated well  Mobility Referral Yes  Mobility visit 1 Mobility  Mobility Specialist Start Time (ACUTE ONLY) 1145  Mobility Specialist Stop Time (ACUTE ONLY) 1159  Mobility Specialist Time Calculation (min) (ACUTE ONLY) 14 min   Pt agreeable to mobility session, sitter present. Required steadying assist with no AD use. VSS on RA, SpO2 dropped on monitor but without good pleth. Pt with no c/o SOB. Did state her legs felt weak. Pt back sitting EOB with all needs met, sitter present.   Addison Lank Mobility Specialist Please contact via SecureChat or  Rehab office at [phone]

## 2023-05-29 NOTE — Progress Notes (Signed)
PT Cancellation Note  Patient Details Name: Marisa Nunez MRN: [id number] DOB: [birth year]   Cancelled Treatment:    Reason Eval/Treat Not Completed: Other (comment) (Per Nursing, pt has had high anxiety and just got back to rest after multiple medical professionals coming in and out. Requesting PT hold eval at this time. Will follow up later as schedule permits.)  Cheri Guppy, PT, DPT Acute Rehabilitation Services Office: [phone] Secure Chat Preferred  [name] 05/29/2023, 11:02 AM

## 2023-05-29 NOTE — Progress Notes (Signed)
Patient Name: Marisa Nunez Date of Encounter: 05/29/2023 [facility] Cardiologist: Parke Poisson, MD   Interval Summary  .    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  Cardiology is following for acute heart failure with mildly reduced EF secondary to severe MR/TR, TEE.  I spoke with her daughter yesterday who wants everything done for her.  Patient has mild memory loss, and has a sitter in the room.  I discussed plans for TEE and she said okay.   Vital Signs .    Vitals:   05/29/23 0300 05/29/23 0554 05/29/23 0821 05/29/23 0823  BP: 126/66  (!) 146/111 (!) 165/103  Pulse: 80  76 92  Resp: 19  18   Temp: 97.8 F (36.6 C)  97.9 F (36.6 C)   TempSrc: Oral  Oral   SpO2: 98%  97% 97%  Weight:  51 kg    Height:        Intake/Output Summary (Last 24 hours) at 05/29/2023 1005 Last data filed at 05/29/2023 0909 Gross per 24 hour  Intake 666 ml  Output 750 ml  Net -84 ml      05/29/2023    5:54 AM 05/28/2023    2:00 PM 05/27/2023    2:12 PM  Last 3 Weights  Weight (lbs) 112 lb 7 oz 108 lb 11 oz 114 lb  Weight (kg) 51 kg 49.3 kg 51.71 kg      Telemetry/ECG    Rate controlled A-fib- Personally Reviewed  Physical Exam .   GEN: No acute distress.   Neck: Exam a little challenging patient is rocking back and forth Cardiac: Irregularly irregular, holosystolic murmur Respiratory: Increased work of breathing after taking a shower, on room air.  No wheezing GI: Soft, nontender, non-distended  MS: No leg edema  Assessment & Plan .     Acute heart failure with mildly reduced EF Severe mitral regurgitation Severe tricuspid regurgitation -Sent in from structural heart office to hospital for admission due to  acute heart failure 05/27/2023, BNP 1131, CXR no acute finding POA. She has been started on IV Lasix 40 mg daily,increased 60 daily yesterday.-Will continue diuresis and increase Lasix 60 mg to twice daily -Per structural heart team recommendation:possible TEE, left and right heart cath this admission for further evaluation of MR intervention; although with her dementia, she was felt possibly not a great candidate for invasive procedures or surgery.  Can plan to start with TEE and see if she would be a good candidate for clip, then can plan for left and right heart cath. Informed Consent   Shared Decision Making/Informed Consent   The risks [esophageal damage, perforation (1:10,000 risk), bleeding, pharyngeal hematoma as well as other potential complications associated with conscious sedation including aspiration, arrhythmia, respiratory failure and death], benefits (treatment guidance and diagnostic support) and alternatives of a transesophageal echocardiogram were discussed in detail with Marisa Nunez and she is willing to proceed.      -GDMT: Continue Toprol-XL 75 mg daily, may add Entresto 24/26 BID today   Persistent atrial fibrillation -Failed DCCV in the past, goal is rate control at this time -PTA Xarelto 20 mg daily  -Continue beta-blocker; toprol XL was uptitrated from 50 to 75 yesterday and has done well with her heart rates  Time Spent Directly with Patient:  I have spent a total of 35 minutes with the patient reviewing hospital notes, telemetry, EKGs, labs and examining the patient as well as establishing an assessment and plan that was discussed personally with the patient.     For questions or updates, please contact [facility] Please consult [email or web address] for contact info under        Signed, Gedalya Jim, Alben Spittle, MD

## 2023-05-29 NOTE — Evaluation (Signed)
Physical Therapy Evaluation Patient Details Name: Marisa Nunez MRN: [id number] DOB: [birth year] Today's Date: 05/29/2023  History of Present Illness  Marisa Nunez is a 78 y.o. female admitted 05/27/23 for CHF exacerbation. Of note, pt was d/c 1/27 with RW. 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   Clinical Impression  Pt admitted with diagnosis above. PTA, pt was living with her daughter and grandson in a house with 4 STE and B handrails. She was able to complete most ADLs with intermittent support from family and ambulated without an AD.  Pt completed STS from EOB independently and ambulated with supervision without an AD. Pt is slightly unsteady with gait, no overt LOB; however, reaching for support from furniture in room or handrail in hallway. Discussed utilizing RW for additional stability and educated on safe use of RW and proper sequencing of RW with activities. Pt c/o BLE weakness and required one seated rest during ambulation. Provided handout for BLE HEP she could perform while seated or EOB to maintain ROM/strength. Pt currently with functional limitations due to the deficits listed below (see PT Problem List). Pt will benefit from acute skilled PT to increase her independence and safety with mobility to allow discharge. Recommend Home with HHPT, if family is able to provide 24/7 assist given cognitive impairments.     If plan is discharge home, recommend the following: Assistance with cooking/housework;Direct supervision/assist for medications management;Direct supervision/assist for financial management;Supervision due to cognitive status;Assist for transportation;Help with stairs or ramp for entrance;A little help with walking and/or transfers   Can travel by private vehicle   Yes    Equipment Recommendations None recommended by PT (Pt already has DME)   Recommendations for Other Services       Functional Status Assessment Patient has had a recent decline in their functional status and demonstrates the ability to make significant improvements in function in a reasonable and predictable amount of time.     Precautions / Restrictions Precautions Precautions: Fall Precaution Comments: relatively low fall risk Restrictions Weight Bearing Restrictions Per Provider Order: No      Mobility  Bed Mobility               General bed mobility comments: Not assessed. Pt greeted seated EOB.    Transfers Overall transfer level: Independent Equipment used: None Transfers: Sit to/from Stand Sit to Stand: Independent                Ambulation/Gait Ambulation/Gait assistance: Supervision Gait Distance (Feet): 250 Feet (x2 with seated rest break in between) Assistive device: None Gait Pattern/deviations: Step-to pattern, Shuffle, Decreased step length - right, Decreased step length - left, Trunk flexed   Gait velocity interpretation: <1.31 ft/sec, indicative of household ambulator   General Gait Details: Pt intermittently reached for handrail in hallway for additional support while ambulating. Her steps were slow, limited foot clearence, and decrease step length. Educated pt on safe use and sequencing of RW. Recommend using it when walking.  Stairs            Wheelchair Mobility     Tilt Bed    Modified Rankin (Stroke Patients Only)       Balance Overall balance assessment: Mild deficits observed, not formally tested  Pertinent Vitals/Pain Pain Assessment Pain Assessment: No/denies pain Faces Pain Scale: No hurt Breathing: normal Negative Vocalization: none Facial Expression: smiling or inexpressive Body Language: relaxed Consolability: no need to console PAINAD Score: 0    Home Living Family/patient expects to be discharged to:: Private  residence Living Arrangements: Children;Other relatives (Daughter and Grandson) Available Help at Discharge: Family;Available PRN/intermittently Type of Home: House Home Access: Stairs to enter Entrance Stairs-Rails: Can reach both Entrance Stairs-Number of Steps: 4   Home Layout: One level Home Equipment: Agricultural consultant (2 wheels) Additional Comments: Pt is a poor historian.    Prior Function Prior Level of Function : Needs assist             Mobility Comments: Pt denies using an AD when ambulating, although was provided a RW at last admission. ADLs Comments: Pt reports she is independent with ADLs, recieves assistance with bathing from her daughter. Relies on her daughter for transportion, meals, medications, and finances.     Extremity/Trunk Assessment   Upper Extremity Assessment Upper Extremity Assessment: Defer to OT evaluation    Lower Extremity Assessment Lower Extremity Assessment: Overall WFL for tasks assessed    Cervical / Trunk Assessment Cervical / Trunk Assessment: Normal  Communication   Communication Communication: No apparent difficulties Cueing Techniques: Verbal cues;Tactile cues  Cognition Arousal: Alert Behavior During Therapy: Flat affect Overall Cognitive Status: History of cognitive impairments - at baseline (Dementia)                                 General Comments: Pt follows one step commands consistently.        General Comments General comments (skin integrity, edema, etc.): VSS on RA at rest and with activity.    Exercises General Exercises - Lower Extremity Ankle Circles/Pumps: Both, 10 reps, Supine, Seated (Instructed pt to perform 3-4 times a day to maintain ROM/strength.) Quad Sets: Both, 10 reps, Supine (Instructed pt to perform 3-4 times a day to maintain ROM/strength.) Gluteal Sets: Both, 10 reps, Supine (Instructed pt to perform 3-4 times a day to maintain ROM/strength.) Long Arc Quad: Both, 10 reps, Seated  (Instructed pt to perform 3-4 times a day to maintain ROM/strength.) Heel Slides: Both, 10 reps, Supine (Instructed pt to perform 3-4 times a day to maintain ROM/strength.) Hip ABduction/ADduction: Both, 10 reps, Supine (Instructed pt to perform 3-4 times a day to maintain ROM/strength.) Straight Leg Raises: Supine, Both, 10 reps (Instructed pt to perform 3-4 times a day to maintain ROM/strength.) Hip Flexion/Marching: Both, 10 reps, Seated (Instructed pt to perform 3-4 times a day  to maintain ROM/strength.) Toe Raises: Both, 10 reps, Seated (Instructed pt to perform 3-4 times a day to maintain ROM/strength.) Heel Raises: Both, 10 reps, Seated (Instructed pt to perform 3-4 times a day to maintain ROM/strength.)   Assessment/Plan    PT Assessment Patient needs continued PT services  PT Problem List Decreased activity tolerance;Decreased balance;Decreased mobility;Decreased coordination;Decreased knowledge of use of DME;Decreased safety awareness       PT Treatment Interventions DME instruction;Gait training;Functional mobility training;Therapeutic activities;Balance training;Therapeutic exercise;Patient/family education;Stair training    PT Goals (Current goals can be found in the Care Plan section)  Acute Rehab PT Goals Patient Stated Goal: Get my legs stronger PT Goal Formulation: With patient Time For Goal Achievement: 06/12/23 Potential to Achieve Goals: Fair    Frequency Min 1X/week     Co-evaluation  AM-PAC PT "6 Clicks" Mobility  Outcome Measure Help needed turning from your back to your side while in a flat bed without using bedrails?: A Little Help needed moving from lying on your back to sitting on the side of a flat bed without using bedrails?: A Little Help needed moving to and from a bed to a chair (including a wheelchair)?: A Little Help needed standing up from a chair using your arms (e.g., wheelchair or bedside chair)?: A Little Help needed to  walk in hospital room?: A Little Help needed climbing 3-5 steps with a railing? : A Lot 6 Click Score: 17    End of Session Equipment Utilized During Treatment: Gait belt Activity Tolerance: Patient tolerated treatment well Patient left: in bed;with call bell/phone within reach;with nursing/sitter in room Nurse Communication: Mobility status;Other (comment) (RN notified titrated pt down to RA with SpO2 >92%.) PT Visit Diagnosis: Other abnormalities of gait and mobility (R26.89);Unsteadiness on feet (R26.81)    Time: 1130-1157 PT Time Calculation (min) (ACUTE ONLY): 27 min   Charges:   PT Evaluation $PT Eval Moderate Complexity: 1 Mod PT Treatments $Therapeutic Exercise: 8-22 mins PT General Charges $$ ACUTE PT VISIT: 1 Visit         Cheri Guppy, PT, DPT Acute Rehabilitation Services Office: [phone] Secure Chat Preferred   [name] 05/29/2023, 12:45 PM

## 2023-05-30 DIAGNOSIS — F03918 Unspecified dementia, unspecified severity, with other behavioral disturbance: Secondary | ICD-10-CM

## 2023-05-30 DIAGNOSIS — I509 Heart failure, unspecified: Secondary | ICD-10-CM | POA: Diagnosis not present

## 2023-05-30 DIAGNOSIS — I340 Nonrheumatic mitral (valve) insufficiency: Secondary | ICD-10-CM | POA: Diagnosis not present

## 2023-05-30 DIAGNOSIS — I5023 Acute on chronic systolic (congestive) heart failure: Secondary | ICD-10-CM | POA: Diagnosis not present

## 2023-05-30 LAB — BASIC METABOLIC PANEL
Anion gap: 13 (ref 5–15)
BUN: 29 mg/dL — ABNORMAL HIGH (ref 8–23)
CO2: 28 mmol/L (ref 22–32)
Calcium: 8.8 mg/dL — ABNORMAL LOW (ref 8.9–10.3)
Chloride: 91 mmol/L — ABNORMAL LOW (ref 98–111)
Creatinine, Ser: 1.1 mg/dL — ABNORMAL HIGH (ref 0.44–1.00)
GFR, Estimated: 52 mL/min — ABNORMAL LOW (ref >60)
Glucose, Bld: 87 mg/dL (ref 70–99)
Potassium: 4.1 mmol/L (ref 3.5–5.1)
Sodium: 132 mmol/L — ABNORMAL LOW (ref 135–145)

## 2023-05-30 LAB — MAGNESIUM: Magnesium: 2 mg/dL (ref 1.7–2.4)

## 2023-05-30 MED ORDER — SACUBITRIL-VALSARTAN 24-26 MG PO TABS
1.0000 | ORAL_TABLET | Freq: Two times a day (BID) | ORAL | Status: DC
  Administered 2023-05-30 – 2023-05-31 (×2): 1 via ORAL
  Filled 2023-05-30 (×2): qty 1

## 2023-05-30 NOTE — Progress Notes (Signed)
                  Subjective:   Summary: Patient is a 78 year old woman with dementia with behavioral disturbances, atrial fibrillation on Xarelto, HFrEF w/EF 45-50%, severe mitral regurgitation, CVA, COPD presenting with dyspnea, admitted for CHF exacerbation.   Patient was very sleepy this morning, denies shortness of breath, chest pain, palpitations. States her back pain is well controlled with lidocaine patch. No new complaints  Objective:  Vital signs in last 24 hours: Vitals:   05/29/23 2356 05/30/23 0430 05/30/23 0537 05/30/23 0725  BP: (!) 92/54   115/65  Pulse:    90  Resp: 16 16  16   Temp: 97.6 F (36.4 C) 97.6 F (36.4 C)  (!) 96.6 F (35.9 C)  TempSrc: Axillary Axillary  Oral  SpO2:  99%  98%  Weight:   51.3 kg   Height:       Supplemental O2: Room Air SpO2: 98 % O2 Flow Rate (L/min): 2 L/min  Physical Exam:  Constitutional: elderly cachexic woman, in no acute distress Cardiovascular: irregular rhythm, systolic murmur Pulmonary/Chest: CTAB, no wheezes Abdominal: soft, nontender Neuro: no focal deficit noted  Assessment/Plan:   Acute heart failure with mildly reduced EF exacerbation Severe mitral regurgitation Severe tricuspid regurgitation Patient not overtly volume overloaded on exam, no supplemental oxygen requirements. Good urine output with increased Lasix dose yesterday. Creatinine elevated to 1.1 from 0.88 yesterday, may indicate decreasing need for aggressive diuresis. Appreciate cardiology team recommendations.  - on IV Lasix 60mg  BID, adjustments per cardiology recommendations - TEE planned tentatively for Monday  Atrial fibrillation with RVR, now rate controlled Heart rate remains rate controlled, still in atrial fibrillation.  - Continue home Xarelto 20mg , metoprolol XL 75mg  daily - on telemetry  Dementia with behavioral disturbance Continue home seroquel 50mg  in the am, seroquel 75mg  at bedtime, memantine 10mg  daily, melatonin 3mg  qus,  buspirone 5mg  BID, citalopram 20mg  daily, Depakote 250mg  BID. PRN olanzapine 2.5mg  added  Diet: Heart healthy VTE: Xarelto Code: DNR  Dispo: Anticipated discharge to home vs SNF pending PT evaluation  Monna Fam, MD PGY-1 Internal Medicine Resident Pager Number [phone] Please contact the on call pager after 5 pm and on weekends at [phone].

## 2023-05-30 NOTE — Progress Notes (Signed)
Mobility Specialist Progress Note;   05/30/23 1405  Mobility  Activity Ambulated with assistance in hallway  Level of Assistance Standby assist, set-up cues, supervision of patient - no hands on  Assistive Device Front wheel walker  Distance Ambulated (ft) 200 ft  Activity Response Tolerated well  Mobility Referral Yes  Mobility visit 1 Mobility  Mobility Specialist Start Time (ACUTE ONLY) 1405  Mobility Specialist Stop Time (ACUTE ONLY) 1425  Mobility Specialist Time Calculation (min) (ACUTE ONLY) 20 min   Pt eager to ambulate more this afternoon. Required no physical assistance throughout ambulation, SV. VSS throughout. C/o back pain throughout session. Pt returned back to chair with all needs met. Corporate treasurer in room.   Caesar Bookman Mobility Specialist Please contact via SecureChat or [employer] [phone]

## 2023-05-30 NOTE — Progress Notes (Deleted)
Internal Medicine Attending:  I personally saw and examined the patient. Discussed with the resident and agree with the resident's findings and plan of care as documented in the resident's note. Resting quietly Sl increase in Cr but K stable.  Continue diuresis, appreciate CV f/u Ginnie Smart, MD

## 2023-05-30 NOTE — Progress Notes (Signed)
Patient Name: Marisa Nunez Date of Encounter: 05/30/2023 [facility] Cardiologist: Parke Poisson, MD   Interval Summary  .    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  Cardiology is following for acute heart failure with mildly reduced EF secondary to severe MR/TR, TEE  Diuresed about 1.8L yesterday and another 1.7L negative today. Slight bump in creatinine to 1.10 today (From 0.88). Sodium trending down.  BNP is high at 1131.   Vital Signs .    Vitals:   05/30/23 0537 05/30/23 0725 05/30/23 1122 05/30/23 1125  BP:  115/65 111/62 111/62  Pulse:  90 98   Resp:  16 15   Temp:  (!) 96.6 F (35.9 C) (!) 97.3 F (36.3 C)   TempSrc:  Oral Axillary   SpO2:  98%  97%  Weight: 51.3 kg     Height:        Intake/Output Summary (Last 24 hours) at 05/30/2023 1215 Last data filed at 05/30/2023 1145 Gross per 24 hour  Intake 357 ml  Output 3300 ml  Net -2943 ml      05/30/2023    5:37 AM 05/29/2023    5:54 AM 05/28/2023    2:00 PM  Last 3 Weights  Weight (lbs) 113 lb 112 lb 7 oz 108 lb 11 oz  Weight (kg) 51.256 kg 51 kg 49.3 kg      Telemetry/ECG    Afib with CVR- Personally Reviewed  Physical Exam .    General appearance: alert and no distress Neck: JVD - a few cm above sternal notch, no carotid bruit, and thyroid not enlarged, symmetric, no tenderness/mass/nodules Lungs: diminished breath sounds bibasilar Heart: regular rate and rhythm, S1, S2 normal, and systolic murmur: holosystolic 3/6, blowing at apex Extremities: extremities normal, atraumatic, no cyanosis or edema Skin: Skin color, texture, turgor normal. No rashes or lesions Neurologic: Grossly normal Psych: Flat affect   Assessment & Plan .      Acute heart failure with mildly reduced EF Severe mitral regurgitation Severe tricuspid regurgitation -Sent in from structural heart office to hospital for admission due to acute heart failure 05/27/2023, BNP 1131, CXR no acute finding POA. She has been started on IV Lasix 40 mg daily,increased 60 daily yesterday. -Will continue diuresis with Lasix 60 mg to twice daily - Scheduled for TEE on Monday with Dr. Flora Lipps - Continue Toprol-XL 75 mg daily - Start Entresto 24/26 BID today   Persistent atrial fibrillation -Failed DCCV in the past, goal is rate control at this time -PTA Xarelto 20 mg daily  -Continue beta-blocker; toprol XL was uptitrated from 50 to 75 yesterday and has done well with her heart rates  Dementia with behavioral disturbance - Calm today, upright and eating, sitter in the room, flat affect   Time Spent Directly with Patient:  I have spent a total of 35 minutes with the patient reviewing hospital notes, telemetry, EKGs, labs and examining the patient as well as establishing an assessment and plan that was discussed personally with the patient.     For questions or updates, please contact [facility] Please consult [email or web address] for contact info under   Chrystie Nose, MD, Milagros Loll  [place]  [facility]  Medical Director of the [facility]  [facility] Diplomate of the American Board of Clinical Lipidology Attending Cardiologist  Direct Dial: [phone]  Fax: [phone]  Website:  [email or web address]  Chrystie Nose, MD

## 2023-05-30 NOTE — Progress Notes (Signed)
Mobility Specialist Progress Note;   05/30/23 0930  Mobility  Activity Ambulated with assistance in hallway  Level of Assistance Standby assist, set-up cues, supervision of patient - no hands on  Assistive Device Front wheel walker  Distance Ambulated (ft) 200 ft  Activity Response Tolerated well  Mobility Referral Yes  Mobility visit 1 Mobility  Mobility Specialist Start Time (ACUTE ONLY) 0930  Mobility Specialist Stop Time (ACUTE ONLY) 0945  Mobility Specialist Time Calculation (min) (ACUTE ONLY) 15 min   Pt agreeable to mobility, sitter present in room. Requested assistance to BR at BOS, void successful. Required no physical assistance throughout ambulation, SV. Attempted ambulation w/ RW, per PT recommendation. Pt seemed more stable w/ AD. VSS throughout and no c/o during session. Pt returned back to bed with all needs met, sitter present.   Caesar Bookman Mobility Specialist Please contact via SecureChat or [employer] [phone]

## 2023-05-30 NOTE — Plan of Care (Signed)
Patient remains on MC-3E at time of writing. 1:1 safety sitter as patient remains disoriented to place, time, situation; remains a high risk for falls, and is generally non-redirectable. Fall preventions precautions initiated by this RN overnight include adding a yellow falls risk arm band and renewal of the order for a Recruitment consultant. DNR arm band also placed on the patient overnight by this RN. MEWS warning triggered inappropriately overnight for VS not measured at a resting state; VS re-measured.    Problem: Education: Goal: Knowledge of General Education information will improve Description: Including pain rating scale, medication(s)/side effects and non-pharmacologic comfort measures Outcome: Not Progressing   Problem: Health Behavior/Discharge Planning: Goal: Ability to manage health-related needs will improve Outcome: Not Progressing   Problem: Clinical Measurements: Goal: Ability to maintain clinical measurements within normal limits will improve Outcome: Not Progressing Goal: Will remain free from infection Outcome: Not Progressing Goal: Diagnostic test results will improve Outcome: Not Progressing Goal: Respiratory complications will improve Outcome: Not Progressing Goal: Cardiovascular complication will be avoided Outcome: Not Progressing   Problem: Activity: Goal: Risk for activity intolerance will decrease Outcome: Not Progressing   Problem: Nutrition: Goal: Adequate nutrition will be maintained Outcome: Not Progressing   Problem: Coping: Goal: Level of anxiety will decrease Outcome: Not Progressing   Problem: Elimination: Goal: Will not experience complications related to bowel motility Outcome: Not Progressing Goal: Will not experience complications related to urinary retention Outcome: Not Progressing   Problem: Pain Managment: Goal: General experience of comfort will improve and/or be controlled Outcome: Not Progressing   Problem: Safety: Goal: Ability  to remain free from injury will improve Outcome: Not Progressing   Problem: Skin Integrity: Goal: Risk for impaired skin integrity will decrease Outcome: Not Progressing   Problem: Education: Goal: Ability to demonstrate management of disease process will improve Outcome: Not Progressing Goal: Ability to verbalize understanding of medication therapies will improve Outcome: Not Progressing Goal: Individualized Educational Video(s) Outcome: Not Progressing   Problem: Activity: Goal: Capacity to carry out activities will improve Outcome: Not Progressing   Problem: Cardiac: Goal: Ability to achieve and maintain adequate cardiopulmonary perfusion will improve Outcome: Not Progressing

## 2023-05-30 NOTE — Plan of Care (Signed)
  Problem: Clinical Measurements: Goal: Respiratory complications will improve Outcome: Progressing Goal: Cardiovascular complication will be avoided Outcome: Progressing   Problem: Safety: Goal: Ability to remain free from injury will improve Outcome: Progressing   Problem: Skin Integrity: Goal: Risk for impaired skin integrity will decrease Outcome: Progressing   

## 2023-05-30 NOTE — Plan of Care (Signed)

## 2023-05-31 DIAGNOSIS — I340 Nonrheumatic mitral (valve) insufficiency: Secondary | ICD-10-CM | POA: Diagnosis not present

## 2023-05-31 DIAGNOSIS — I509 Heart failure, unspecified: Secondary | ICD-10-CM | POA: Diagnosis not present

## 2023-05-31 LAB — BASIC METABOLIC PANEL
Anion gap: 12 (ref 5–15)
BUN: 28 mg/dL — ABNORMAL HIGH (ref 8–23)
CO2: 30 mmol/L (ref 22–32)
Calcium: 9.1 mg/dL (ref 8.9–10.3)
Chloride: 93 mmol/L — ABNORMAL LOW (ref 98–111)
Creatinine, Ser: 1.39 mg/dL — ABNORMAL HIGH (ref 0.44–1.00)
GFR, Estimated: 40 mL/min — ABNORMAL LOW (ref >60)
Glucose, Bld: 150 mg/dL — ABNORMAL HIGH (ref 70–99)
Potassium: 4 mmol/L (ref 3.5–5.1)
Sodium: 135 mmol/L (ref 135–145)

## 2023-05-31 NOTE — Progress Notes (Signed)
                  Subjective:   Summary: Patient is a 78 year old woman with dementia with behavioral disturbances, atrial fibrillation on Xarelto, HFrEF w/EF 45-50%, severe mitral regurgitation, CVA, COPD presenting with dyspnea, admitted for CHF exacerbation.   Patient was very sleepy this morning, denies shortness of breath, chest pain, palpitations. No new complaints. Sitter at bedside denied any episodes of agitation or outbursts  Objective:  Vital signs in last 24 hours: Vitals:   05/30/23 1900 05/31/23 0000 05/31/23 0500 05/31/23 0600  BP: (!) 142/97 105/81 103/72   Pulse: (!) 106 76 73   Resp: 18 16 15    Temp: (!) 97.5 F (36.4 C) 97.7 F (36.5 C) 97.7 F (36.5 C)   TempSrc: Oral Oral Oral   SpO2:  98% 98% 98%  Weight:    49.2 kg  Height:       Supplemental O2: Room Air SpO2: 98 % O2 Flow Rate (L/min): 2 L/min  Physical Exam:  Constitutional: elderly cachexic woman, in no acute distress Cardiovascular: irregular rhythm, systolic murmur Pulmonary/Chest: CTAB, no wheezes Abdominal: soft, nontender Neuro: no focal deficit noted  Assessment/Plan:   Acute heart failure with mildly reduced EF exacerbation Severe mitral regurgitation Severe tricuspid regurgitation Patient not overtly volume overloaded on exam, no supplemental oxygen requirements. Good urine output with Lasix dose yesterday. Appreciate cardiology team recommendations.  - on IV Lasix 60mg  BID, adjustments per cardiology recommendations - TEE planned tentatively for Monday  Atrial fibrillation with RVR, now rate controlled Heart rate remains rate controlled, still in atrial fibrillation.  - Continue home Xarelto 20mg , metoprolol XL 75mg  daily - on telemetry  Dementia with behavioral disturbance Continue home seroquel 50mg  in the am, seroquel 75mg  at bedtime, memantine 10mg  daily, melatonin 3mg  qus, buspirone 5mg  BID, citalopram 20mg  daily, Depakote 250mg  BID. PRN olanzapine 2.5mg  added  Diet: Heart  healthy VTE: Xarelto Code: DNR  Dispo: Anticipated discharge to home vs SNF pending PT evaluation  Monna Fam, MD PGY-1 Internal Medicine Resident Pager Number [phone] Please contact the on call pager after 5 pm and on weekends at [phone].

## 2023-05-31 NOTE — Progress Notes (Addendum)
Patient Name: Marisa Nunez Date of Encounter: 05/31/2023 [facility] Cardiologist: Parke Poisson, MD   Interval Summary  .    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  Cardiology is following for acute heart failure with mildly reduced EF secondary to severe MR/TR, TEE  Continues to be negative- now 1.6L more - overall 3L negative. Breathing has improved, edema has improved. Creatinine bumped from 1.10. to 1.39.  Vital Signs .    Vitals:   05/31/23 0500 05/31/23 0600 05/31/23 0935 05/31/23 1205  BP: 103/72  102/68 (!) 83/57  Pulse: 73  (!) 120 69  Resp: 15  16 17   Temp: 97.7 F (36.5 C)  97.8 F (36.6 C) 97.8 F (36.6 C)  TempSrc: Oral  Oral Oral  SpO2: 98% 98% 99% 92%  Weight:  49.2 kg    Height:        Intake/Output Summary (Last 24 hours) at 05/31/2023 1207 Last data filed at 05/30/2023 1227 Gross per 24 hour  Intake 150 ml  Output --  Net 150 ml      05/31/2023    6:00 AM 05/30/2023    5:37 AM 05/29/2023    5:54 AM  Last 3 Weights  Weight (lbs) 108 lb 7.5 oz 113 lb 112 lb 7 oz  Weight (kg) 49.2 kg 51.256 kg 51 kg      Telemetry/ECG    Afib with CVR- Personally Reviewed  Physical Exam .    General appearance: alert and no distress Neck: JVD - a few cm above sternal notch, no carotid bruit, and thyroid not enlarged, symmetric, no tenderness/mass/nodules Lungs: diminished breath sounds bibasilar Heart: regular rate and rhythm, S1, S2 normal, and systolic murmur: holosystolic 3/6, blowing at apex Extremities: extremities normal, atraumatic, no cyanosis or edema Skin: Skin color, texture, turgor normal. No rashes or lesions Neurologic: Grossly normal Psych: Flat affect   Assessment & Plan .      Acute heart failure with mildly reduced EF Severe mitral regurgitation Severe tricuspid regurgitation -Sent in from structural heart office to hospital for admission due to acute heart failure 05/27/2023, BNP 1131, CXR no acute finding POA. She has been started on IV Lasix 40 mg daily,increased 60 daily yesterday. -Creatinine increased - will d/c lasix for now, will hold Entresto as well - Scheduled for TEE on Monday with Dr. Flora Lipps, however, today she says she doesn't want it - I don't think she has capacity to make these decisions and previously her daughter had provided consent (see progress noted from 1/31 by Dr. Wyline Mood) - Continue Toprol-XL 75 mg daily - Continue Entresto 24/26 BID today   Persistent atrial fibrillation -Failed DCCV in the past, goal is rate control at this time -PTA Xarelto 20 mg daily  -Continue beta-blocker; toprol XL was uptitrated from 50 to 75 yesterday and has done well with her heart rates  Dementia with behavioral disturbance - Calm today, upright and eating, sitter in the room, flat affect   Time Spent Directly with Patient:  I have spent a total of 35 minutes with the patient reviewing hospital notes, telemetry, EKGs, labs and examining the patient as well as establishing an assessment and plan that was discussed personally with the patient.     For questions or updates, please  contact [facility] Please consult [email or web address] for contact info under   Chrystie Nose, MD, Milagros Loll  [place]ance  [facility]  Medical Director of the Advanced Lipid Disorders &  Cardiovascular Risk Reduction Clinic Diplomate of the American Board of Clinical Lipidology Attending Cardiologist  Direct Dial: [phone]  Fax: [phone]  Website:  [email or web address]  Chrystie Nose, MD

## 2023-05-31 NOTE — Anesthesia Preprocedure Evaluation (Signed)
Anesthesia Evaluation  Patient identified by MRN, date of birth, ID band Patient confused    Reviewed: Allergy & Precautions, NPO status , Patient's Chart, lab work & pertinent test results, reviewed documented beta blocker date and time   Airway        Dental   Pulmonary COPD,  COPD inhaler, former smoker          Cardiovascular hypertension, Pt. on home beta blockers and Pt. on medications +CHF  + Valvular Problems/Murmurs (mod/severe MR) MR   TTE 2025  1. Left ventricular ejection fraction, by estimation, is 45 to 50%. The  left ventricle has mildly decreased function. Left ventricular diastolic  parameters are indeterminate.   2. Right ventricular systolic function is normal. The right ventricular  size is normal.   3. Left atrial size was moderately dilated.   4. Right atrial size was moderately dilated.   5. Moderate to severe mitral valve regurgitation.   6. Tricuspid valve regurgitation is severe.   7. The aortic valve is tricuspid. Aortic valve regurgitation is not  visualized.   8. The inferior vena cava is normal in size with greater than 50%  respiratory variability, suggesting right atrial pressure of 3 mmHg.     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, who was sent to ER from structural heart office for hospital admission of decompensated CHF 05/27/2023  Reproductive/Obstetrics                               Anesthesia Physical Anesthesia Plan  ASA: 3  Anesthesia Plan: MAC   Post-op Pain Management:    Induction: Intravenous  PONV Risk Score and Plan: Propofol infusion and Treatment may vary due to age or medical condition  Airway Management Planned: Natural Airway  Additional Equipment:   Intra-op Plan:   Post-operative Plan:   Informed Consent: I have reviewed the patients History and Physical, chart, labs and discussed the procedure including the risks, benefits and alternatives for the proposed anesthesia with the patient or authorized representative who has indicated his/her understanding and acceptance.   Patient has DNR.   Dental advisory given  Plan Discussed with: CRNA  Anesthesia Plan Comments:        Anesthesia Quick Evaluation

## 2023-05-31 NOTE — Plan of Care (Signed)

## 2023-06-01 ENCOUNTER — Encounter (HOSPITAL_COMMUNITY)
Admission: EM | Disposition: A | Discharge status: Nursing Home (Includes ICF) | Payer: Self-pay | Source: Home / Self Care | Attending: Infectious Diseases

## 2023-06-01 ENCOUNTER — Encounter (HOSPITAL_COMMUNITY): Payer: Self-pay | Admitting: Anesthesiology

## 2023-06-01 ENCOUNTER — Other Ambulatory Visit (HOSPITAL_COMMUNITY): Payer: Medicare Other

## 2023-06-01 DIAGNOSIS — I509 Heart failure, unspecified: Secondary | ICD-10-CM | POA: Diagnosis not present

## 2023-06-01 DIAGNOSIS — Z7189 Other specified counseling: Secondary | ICD-10-CM

## 2023-06-01 DIAGNOSIS — I480 Paroxysmal atrial fibrillation: Secondary | ICD-10-CM | POA: Diagnosis not present

## 2023-06-01 DIAGNOSIS — I340 Nonrheumatic mitral (valve) insufficiency: Secondary | ICD-10-CM | POA: Diagnosis not present

## 2023-06-01 LAB — BASIC METABOLIC PANEL
Anion gap: 11 (ref 5–15)
BUN: 33 mg/dL — ABNORMAL HIGH (ref 8–23)
CO2: 29 mmol/L (ref 22–32)
Calcium: 8.9 mg/dL (ref 8.9–10.3)
Chloride: 95 mmol/L — ABNORMAL LOW (ref 98–111)
Creatinine, Ser: 1.07 mg/dL — ABNORMAL HIGH (ref 0.44–1.00)
GFR, Estimated: 54 mL/min — ABNORMAL LOW (ref >60)
Glucose, Bld: 98 mg/dL (ref 70–99)
Potassium: 3.8 mmol/L (ref 3.5–5.1)
Sodium: 135 mmol/L (ref 135–145)

## 2023-06-01 SURGERY — TRANSESOPHAGEAL ECHOCARDIOGRAM (TEE) (CATHLAB)
Anesthesia: Monitor Anesthesia Care

## 2023-06-01 MED ORDER — FUROSEMIDE 40 MG PO TABS
40.0000 mg | ORAL_TABLET | Freq: Every day | ORAL | Status: DC
  Administered 2023-06-01 – 2023-06-04 (×4): 40 mg via ORAL
  Filled 2023-06-01 (×4): qty 1

## 2023-06-01 MED ORDER — SODIUM CHLORIDE 0.9 % IV SOLN: INTRAVENOUS | Status: AC

## 2023-06-01 NOTE — Progress Notes (Signed)
                  Subjective:   Summary: Patient is a 78 year old woman with dementia with behavioral disturbances, atrial fibrillation on Xarelto, HFrEF w/EF 45-50%, severe mitral regurgitation, CVA, COPD presenting with dyspnea, admitted for CHF exacerbation.   Patient very alert and conversational this morning. She does not recall becoming agitated when she was taken for her procedure earlier today. Daughter at bedside.   Objective:  Vital signs in last 24 hours: Vitals:   06/01/23 0512 06/01/23 0718 06/01/23 0732 06/01/23 0831  BP:   113/79   Pulse:    (!) 113  Resp:  20 20 (!) 25  Temp:   97.7 F (36.5 C) (!) 97.4 F (36.3 C)  TempSrc:  Oral Oral Temporal  SpO2:   91%   Weight: 45.6 kg     Height:       Supplemental O2: Room Air SpO2: 91 % O2 Flow Rate (L/min): 2 L/min  Physical Exam:  Constitutional: elderly cachexic woman, in no acute distress Cardiovascular: irregular rhythm, systolic murmur Pulmonary/Chest: CTAB, no wheezes Abdominal: soft, nontender Neuro: no focal deficit noted  Assessment/Plan:   Acute heart failure with mildly reduced EF exacerbation Severe mitral regurgitation Severe tricuspid regurgitation Diuresis and Entresto held yesterday due to increasing creatinine, which returned to baseline today. TEE was planned for today however patient became very agitated and the procedure was cancelled. Her delirium will need to be greatly improved if TEE can be considered again, which is unlikely to happen considering her severe dementia. With her severe comorbidities she is a poor candidate for cardiac intervention, she and family would likely benefit from palliative meeting.  - Continue home oral Lasix 40mg  daily, other adjustments per cardiology - Palliative consult placed  Atrial fibrillation with RVR In and out of RVR. Tachycardic to 120s this morning.  - Continue home Xarelto 20mg , metoprolol XL 75mg  daily - on telemetry  Dementia with behavioral  disturbance Continue home seroquel 50mg  in the am, seroquel 75mg  at bedtime, memantine 10mg  daily, melatonin 3mg  qus, buspirone 5mg  BID, citalopram 20mg  daily, Depakote 250mg  BID. PRN olanzapine 2.5mg  added  Diet: Heart healthy VTE: Xarelto Code: DNR  Dispo: Anticipated discharge to home vs SNF pending PT evaluation  Monna Fam, MD PGY-1 Internal Medicine Resident Pager Number [phone] Please contact the on call pager after 5 pm and on weekends at [phone].

## 2023-06-01 NOTE — Telephone Encounter (Signed)
Pt still admitted.  Seen by Dr. Rennis Golden this am.  TEE was cancelled. Pt's daughter has further concerns per his progress note.

## 2023-06-01 NOTE — Progress Notes (Signed)
PT Cancellation Note  Patient Details Name: AYSEL GILCHREST MRN: [id number] DOB: 14-Oct-1945   Cancelled Treatment:    Reason Eval/Treat Not Completed: Patient at procedure or test/unavailable. Pt off floor will follow-up later today as schedule allows.   Cheri Guppy, PT, DPT Acute Rehabilitation Services Office: [phone] Secure Chat Preferred  [name] 06/01/2023, 8:40 AM

## 2023-06-01 NOTE — Plan of Care (Signed)
  Problem: Education: Goal: Knowledge of General Education information will improve Description: Including pain rating scale, medication(s)/side effects and non-pharmacologic comfort measures Outcome: Progressing   Problem: Health Behavior/Discharge Planning: Goal: Ability to manage health-related needs will improve Outcome: Progressing   Problem: Education: Goal: Knowledge of General Education information will improve Description: Including pain rating scale, medication(s)/side effects and non-pharmacologic comfort measures Outcome: Progressing   Problem: Health Behavior/Discharge Planning: Goal: Ability to manage health-related needs will improve Outcome: Progressing   

## 2023-06-01 NOTE — Progress Notes (Signed)
Patient Name: Marisa Nunez Date of Encounter: 06/01/2023 [facility] Cardiologist: Parke Poisson, MD   Interval Summary  .    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  Cardiology is following for acute heart failure with mildly reduced EF secondary to severe MR/TR.  Diuresed another 543 cc overnight, now 3.6L negative. Appears euvolemic. Plan was for TEE today, however, d/w Dr. Flora Lipps today and it was felt due to her dementia and comorbidies that even if we determined she has severe MR and may need a procedure, that she was not a candidate for one. The patient was also somewhat combative/agitated and refused the procedure, although probably does not have capacity to do that.  I called and spoke with the patient's daughter. She is concerned that if the patient is discharged to home, that she will "Build up" fluid again and be right back in the hospital in a few days.  Vital Signs .    Vitals:   06/01/23 0718 06/01/23 0732 06/01/23 0831 06/01/23 1000  BP:  113/79  113/79  Pulse:   (!) 113 (!) 108  Resp: 20 20 (!) 25   Temp:  97.7 F (36.5 C) (!) 97.4 F (36.3 C)   TempSrc: Oral Oral Temporal   SpO2:  91%    Weight:      Height:        Intake/Output Summary (Last 24 hours) at 06/01/2023 1106 Last data filed at 06/01/2023 1049 Gross per 24 hour  Intake 777 ml  Output 1400 ml  Net -623 ml      06/01/2023    5:12 AM 05/31/2023    6:00 AM 05/30/2023    5:37 AM  Last 3 Weights  Weight (lbs) 100 lb 8.5 oz 108 lb 7.5 oz 113 lb  Weight (kg) 45.6 kg 49.2 kg 51.256 kg      Telemetry/ECG    Afib with CVR- Personally Reviewed  Physical Exam .    General appearance: alert and no distress Neck: no carotid bruit, no JVD, and  thyroid not enlarged, symmetric, no tenderness/mass/nodules Lungs: clear to auscultation bilaterally Heart: regular rate and rhythm, S1, S2 normal, and systolic murmur: holosystolic 3/6, blowing at apex Extremities: extremities normal, atraumatic, no cyanosis or edema Skin: Skin color, texture, turgor normal. No rashes or lesions Neurologic: Grossly normal Psych: Flat affect   Assessment & Plan .     Acute heart failure with mildly reduced EF Severe mitral regurgitation Severe tricuspid regurgitation -Sent in from structural heart office to hospital for admission due to acute heart failure 05/27/2023, BNP 1131, CXR no acute finding POA. She has been started on IV Lasix 40 mg daily,increased 60 daily yesterday. -Creatinine near normal at 1.07 - plan to transition to oral lasix today - 40 mg - she was recently readmitted after not being discharged on a diuretic - she will need to stay on this going forward and will need close follow-up. - Continue Toprol-XL 75 mg daily - Entresto held for worsening renal function - Consider adding aldactone  Persistent atrial fibrillation -Failed DCCV in the past, goal is rate control at this time -PTA Xarelto 20 mg daily  -Continue Toprol XL 75 mg daily  Dementia with behavioral disturbance - there is lability with this which is complicating  medical care. - agree with palliative care evaluation for goals of care  Case discussed with the patient's primary cardiologist, Dr. Jacques Navy and she agrees.  [facility] will sign off.   Medication Recommendations:  as above Other recommendations (labs, testing, etc):  as above Follow up as an outpatient:  Dr. Jacques Navy  Time Spent Directly with Patient:  I have spent a total of 35 minutes with the patient reviewing hospital notes, telemetry, EKGs, labs and examining the patient as well as establishing an assessment and plan that was discussed personally with the patient.     For questions or  updates, please contact [facility] Please consult [email or web address] for contact info under   Chrystie Nose, MD, Milagros Loll  [place]  [facility]  Medical Director of the Advanced Lipid Disorders &  Cardiovascular Risk Reduction Clinic Diplomate of the American Board of Clinical Lipidology Attending Cardiologist  Direct Dial: [phone]  Fax: [phone]  Website:  [email or web address]  Chrystie Nose, MD

## 2023-06-01 NOTE — TOC Initial Note (Addendum)
Transition of Care [facility]) - Initial/Assessment Note    Patient Details  Name: Marisa Nunez MRN: [id number] Date of Birth: 05/30/1945  Transition of Care [facility]) CM/SW Contact:    Michaela Corner, LCSWA Phone Number: 06/01/2023, 10:53 AM  Clinical Narrative:  Family requested to speak with CSW regarding pts dc. Family have reached out to Care [employer] to assist in memory care placement for pt at this time. Care [employer] coordinator is Carmelia Bake [phone], and she told family to contact case worker at hospital so she can get in touch with [place] to coordniate pts discharge.  10:55AM CSW left VM for [employer] w/ [employer].   11:43AM: Duwayne Heck called CSW and explained that family will be touring facilities Wednesday 2/5 for memory care facilities. Danielle stated depending on potential dc plan (post PT/OT/Palliative eval), she will need clinicals (FL2, TB screen) in order to have pt admitted to memory care facility.  TOC will continue to follow.       Expected Discharge Plan: Memory Care Barriers to Discharge: Continued Medical Work up   Patient Goals and CMS Choice Patient states their goals for this hospitalization and ongoing recovery are:: Unable to assess          Expected Discharge Plan and Services In-house Referral: Clinical Social Work     Living arrangements for the past 2 months: Single Family Home                   DME Agency: NA                  Prior Living Arrangements/Services Living arrangements for the past 2 months: Single Family Home Lives with:: Adult Children Patient language and need for interpreter reviewed:: Yes Do you feel safe going back to the place where you live?: Yes      Need for Family Participation in Patient Care: Yes (Comment) Care giver support system in place?: Yes (comment)   Criminal Activity/Legal Involvement Pertinent to Current Situation/Hospitalization: No - Comment as needed  Activities of Daily Living   ADL  Screening (condition at time of admission) Independently performs ADLs?: Yes (appropriate for developmental age) Is the patient deaf or have difficulty hearing?: No Does the patient have difficulty seeing, even when wearing glasses/contacts?: Yes Does the patient have difficulty concentrating, remembering, or making decisions?: No  Permission Sought/Granted Permission sought to share information with : Family Supports    Share Information with NAME: [name]     Permission granted to share info w Relationship: Daughter  Permission granted to share info w Contact Information: [phone]  Emotional Assessment Appearance:: Appears stated age Attitude/Demeanor/Rapport: Unable to Assess Affect (typically observed): Unable to Assess Orientation: : Oriented to Self Alcohol / Substance Use: Not Applicable Psych Involvement: No (comment)  Admission diagnosis:  Shortness of breath [R06.02] Mitral valve regurgitation [I34.0] Mitral valve insufficiency, unspecified etiology [I34.0] Acute on chronic congestive heart failure, unspecified heart failure type [facility]) [I50.9] Patient Active Problem List   Diagnosis Date Noted   Nonrheumatic mitral valve regurgitation 05/27/2023   Mitral valve regurgitation 05/27/2023   Anxiety state 05/17/2023   UTI (urinary tract infection) 05/15/2023   Altered mental status 05/15/2023   Acute on chronic HFrEF (heart failure with reduced ejection fraction) (HCC) 05/08/2023   Pleural effusion on right 05/08/2023   Healthcare maintenance 01/28/2023   Dementia with behavioral disturbance (HCC) 12/26/2022   History of cerebral infarction 10/29/2020   History of right breast cancer 02/25/2018  Mixed hyperlipidemia 05/06/2017   Osteopenia of multiple sites 05/06/2017   Essential hypertension 10/25/2015   Persistent atrial fibrillation (HCC) 10/25/2015   PCP:  Mercie Eon, MD Pharmacy:   [id number] [place], [place] - [phone] AT [facility] OF [facility] & [address] [place] [address] Phone: [phone] Fax: [phone]  [facility] [address] [place] [address] Phone: [phone] Fax: [phone]     Social Drivers of Health (SDOH) Social History: SDOH Screenings   Food Insecurity: No Food Insecurity (05/28/2023)  Housing: Low Risk  (05/28/2023)  Transportation Needs: No Transportation Needs (05/28/2023)  Utilities: Not At Risk (05/28/2023)  Alcohol Screen: Low Risk  (01/28/2023)  Depression (PHQ2-9): Low Risk  (01/28/2023)  Financial Resource Strain: Low Risk  (01/28/2023)  Physical Activity: Sufficiently Active (01/28/2023)  Social Connections: Unknown (05/28/2023)  Stress: No Stress Concern Present (01/28/2023)  Tobacco Use: Medium Risk (05/27/2023)  Health Literacy: Adequate Health Literacy (01/28/2023)   SDOH Interventions:     Readmission Risk Interventions    11/28/2021   12:13 PM  Readmission Risk Prevention Plan  Transportation Screening Complete  PCP or Specialist Appt within 3-5 Days Complete  HRI or Home Care Consult Complete  Social Work Consult for Recovery Care Planning/Counseling Complete  Palliative Care Screening Not Applicable  Medication Review Oceanographer) Complete

## 2023-06-01 NOTE — Progress Notes (Signed)
Mobility Specialist Progress Note:   06/01/23 1125  Mobility  Activity Ambulated with assistance in hallway  Level of Assistance Standby assist, set-up cues, supervision of patient - no hands on  Assistive Device Front wheel walker  Distance Ambulated (ft) 200 ft  Activity Response Tolerated well  Mobility Referral Yes  Mobility visit 1 Mobility  Mobility Specialist Start Time (ACUTE ONLY) 1118  Mobility Specialist Stop Time (ACUTE ONLY) 1128  Mobility Specialist Time Calculation (min) (ACUTE ONLY) 10 min   Pt agreeable to mobility session. Required no physical assist throughout. Pt c/o back pain, limiting mobility. Pt back in chair with all needs met, sitter present.   Addison Lank Mobility Specialist Please contact via SecureChat or  Rehab office at [phone]

## 2023-06-01 NOTE — Progress Notes (Signed)
Marisa Nunez is confused and keeps standing up.  She is adamant she does not want any procedures.  She is very agitated.  I called and discussed her current mental status with her daughter Jason Coop [phone]).  I did discuss that proceeding with transesophageal echocardiogram in her current state could be dangerous and expose her to potential harm.  I discussed her case with Dr. Rennis Golden.  Given her dementia she would likely not be a candidate for aggressive cardiovascular procedures.  At this time we will cancel her procedure today.  The daughter will discuss with the primary team recommendations moving forward.  If transesophageal echo is pursued in the future her delirium will need to be much improved.  Gerri Spore T. Flora Lipps, MD, [facility]  [facility]  [address] [place], [place] [address] [phone]  8:41 AM

## 2023-06-01 NOTE — Plan of Care (Signed)

## 2023-06-01 NOTE — Progress Notes (Signed)
Physical Therapy Treatment Patient Details Name: Marisa Nunez MRN: [id number] DOB: 09-07-1945 Today's Date: 06/01/2023   History of Present Illness Marisa Nunez is a 78 y.o. female admitted 05/27/23 for CHF exacerbation. Of note, pt was d/c 1/27 with RW. 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    PT Comments  Pt pleasant and agreeable to PT session. Introduced RW for increased stability. Educated pt on proper use, sequencing, and safety with AD. Frequent VC/TC and reorientation to correct hand placement and body alignment. She was able to increase her ambulatory distance without a seated rest break this session. Pt engaged in stair training with CGA ascending/descending 4 steps with UE suppport. Pt requires supervision for safety d/t cognitive impairments. Will follow acutely and advance appropriately.       If plan is discharge home, recommend the following: Assistance with cooking/housework;Direct supervision/assist for medications management;Direct supervision/assist for financial management;Supervision due to cognitive status;Assist for transportation;Help with stairs or ramp for entrance   Can travel by private vehicle     Yes  Equipment Recommendations  None recommended by PT (Pt already has DME)    Recommendations for Other Services       Precautions / Restrictions Precautions Precautions: Fall Restrictions Weight Bearing Restrictions Per Provider Order: No     Mobility  Bed Mobility               General bed mobility comments: Not assessed. Pt greeted at the doorway about to walk with nursing.    Transfers Overall transfer level: Independent Equipment used: None Transfers: Sit to/from Stand Sit to Stand: Independent           General transfer comment: STS from commode and recliner chair without AD. Pt demonstrated good eccentric  control.    Ambulation/Gait Ambulation/Gait assistance: Supervision Gait Distance (Feet): 300 Feet (1x300; standing rest break, 1x250) Assistive device: Rolling walker (2 wheels) Gait Pattern/deviations: Step-through pattern, Decreased stride length, Trunk flexed   Gait velocity interpretation: <1.31 ft/sec, indicative of household ambulator   General Gait Details: Pt ambulated from room to stairwell and back. VC/TC for proper hand placement on RW, pt frequently kept hands on bar beneath grips and demonstrated increased fwd flex leaning onto the AD. Educated pt to maintain upright posture with hands on grips, no evidence of carryover within session. Pt's steps were symmetrical but short and slow.   Stairs Stairs: Yes Stairs assistance: Contact guard assist Stair Management: One rail Right, Step to pattern, Forwards, Two rails Number of Stairs: 4 General stair comments: Ascending, pt with unilateral UE support on handrail and leading with RLE. Descending, pt with BUE support on handrails and leading with LLE.   Wheelchair Mobility     Tilt Bed    Modified Rankin (Stroke Patients Only)       Balance Overall balance assessment: Mild deficits observed, not formally tested                                          Cognition Arousal: Alert Behavior During Therapy: WFL for tasks assessed/performed Overall Cognitive Status: History of cognitive impairments - at baseline (Dementia)  General Comments: Pt is oriented to herself, but not place, time, or situation. Pt follows simple commands and is easily distracted. Requires VC/TC intermittently to increase safety awareness.        Exercises      General Comments General comments (skin integrity, edema, etc.): Pt pleasantly confused.      Pertinent Vitals/Pain Pain Assessment Pain Assessment: PAINAD Pain Score: 8  Breathing: normal Negative Vocalization:  occasional moan/groan, low speech, negative/disapproving quality Facial Expression: smiling or inexpressive Body Language: relaxed Consolability: no need to console PAINAD Score: 1 Pain Location: Low back Pain Descriptors / Indicators: Aching, Discomfort Pain Intervention(s): Monitored during session, Patient requesting pain meds-RN notified    Home Living                          Prior Function            PT Goals (current goals can now be found in the care plan section) Acute Rehab PT Goals Patient Stated Goal: Feel stronger Progress towards PT goals: Progressing toward goals    Frequency    Min 1X/week      PT Plan      Co-evaluation              AM-PAC PT "6 Clicks" Mobility   Outcome Measure  Help needed turning from your back to your side while in a flat bed without using bedrails?: A Little Help needed moving from lying on your back to sitting on the side of a flat bed without using bedrails?: A Little Help needed moving to and from a bed to a chair (including a wheelchair)?: A Little Help needed standing up from a chair using your arms (e.g., wheelchair or bedside chair)?: A Little Help needed to walk in hospital room?: A Little Help needed climbing 3-5 steps with a railing? : A Little 6 Click Score: 18    End of Session Equipment Utilized During Treatment: Gait belt Activity Tolerance: Patient tolerated treatment well Patient left: in chair;with call bell/phone within reach;with nursing/sitter in room Nurse Communication: Mobility status PT Visit Diagnosis: Other abnormalities of gait and mobility (R26.89);Unsteadiness on feet (R26.81)     Time: 1610-9604 PT Time Calculation (min) (ACUTE ONLY): 18 min  Charges:    $Gait Training: 8-22 mins                      Cheri Guppy, PT, DPT Acute Rehabilitation Services Office: [phone] Secure Chat Preferred   [name] 06/01/2023, 3:49 PM

## 2023-06-01 NOTE — Telephone Encounter (Signed)
Pt daughter called in to speak with Noreene Larsson, NP

## 2023-06-02 ENCOUNTER — Ambulatory Visit: Payer: Medicare Other | Admitting: Adult Health

## 2023-06-02 DIAGNOSIS — I480 Paroxysmal atrial fibrillation: Secondary | ICD-10-CM | POA: Diagnosis not present

## 2023-06-02 DIAGNOSIS — I5023 Acute on chronic systolic (congestive) heart failure: Secondary | ICD-10-CM | POA: Diagnosis not present

## 2023-06-02 DIAGNOSIS — Z515 Encounter for palliative care: Secondary | ICD-10-CM

## 2023-06-02 DIAGNOSIS — F03918 Unspecified dementia, unspecified severity, with other behavioral disturbance: Secondary | ICD-10-CM | POA: Diagnosis not present

## 2023-06-02 DIAGNOSIS — Z7189 Other specified counseling: Secondary | ICD-10-CM | POA: Diagnosis not present

## 2023-06-02 DIAGNOSIS — I340 Nonrheumatic mitral (valve) insufficiency: Secondary | ICD-10-CM | POA: Diagnosis not present

## 2023-06-02 LAB — BASIC METABOLIC PANEL
Anion gap: 10 (ref 5–15)
BUN: 40 mg/dL — ABNORMAL HIGH (ref 8–23)
CO2: 31 mmol/L (ref 22–32)
Calcium: 8.9 mg/dL (ref 8.9–10.3)
Chloride: 94 mmol/L — ABNORMAL LOW (ref 98–111)
Creatinine, Ser: 1.2 mg/dL — ABNORMAL HIGH (ref 0.44–1.00)
GFR, Estimated: 47 mL/min — ABNORMAL LOW (ref >60)
Glucose, Bld: 88 mg/dL (ref 70–99)
Potassium: 4.2 mmol/L (ref 3.5–5.1)
Sodium: 135 mmol/L (ref 135–145)

## 2023-06-02 MED ORDER — RIVAROXABAN 15 MG PO TABS
15.0000 mg | ORAL_TABLET | Freq: Every day | ORAL | Status: DC
Start: 2023-06-02 — End: 2023-06-04
  Administered 2023-06-02 – 2023-06-03 (×2): 15 mg via ORAL
  Filled 2023-06-02 (×2): qty 1

## 2023-06-02 MED ORDER — OXYCODONE HCL 5 MG PO TABS
5.0000 mg | ORAL_TABLET | Freq: Once | ORAL | Status: AC
  Administered 2023-06-02: 5 mg via ORAL
  Filled 2023-06-02: qty 1

## 2023-06-02 NOTE — Progress Notes (Addendum)
                  Subjective:   Summary: Patient is a 78 year old woman with dementia with behavioral disturbances, atrial fibrillation on Xarelto , HFrEF w/EF 45-50%, severe mitral regurgitation, CVA, COPD presenting with dyspnea, admitted for CHF exacerbation.   Patient very sleepy this morning. Denies chest pain, shortness of breath, abdominal pain. No new complaints.   Objective:  Vital signs in last 24 hours: Vitals:   06/01/23 1111 06/01/23 1510 06/01/23 1916 06/02/23 0355  BP: 108/81 100/83 91/60 120/72  Pulse: 90 88    Resp: 20 20 19 18   Temp: 97.8 F (36.6 C) 97.6 F (36.4 C) 98.1 F (36.7 C) 97.9 F (36.6 C)  TempSrc: Oral Oral Oral Oral  SpO2: 90% 96% 98% 98%  Weight:    48.1 kg  Height:       Supplemental O2: Room Air SpO2: 98 % O2 Flow Rate (L/min): 2 L/min  Physical Exam:  Constitutional: elderly cachexic woman, in no acute distress Cardiovascular: irregular rhythm, systolic murmur Pulmonary/Chest: CTAB, no wheezes Abdominal: soft, nontender Neuro: no focal deficit noted  Assessment/Plan:   Acute heart failure with mildly reduced EF exacerbation, resolved Severe mitral regurgitation Severe tricuspid regurgitation Patient appears euvolemic and at her baseline respiratory and cardiac function. She will not be a good candidate for cardiac intervention, and her ability to have a TEE performed limits what else we can offer. With her end stage valvular disease, a goals of care discussion will likely help assist further treatment options. Nearing stable for discharge pending dispo planning.  - Continue home oral Lasix  40mg  daily - Palliative consult placed  Atrial fibrillation with RVR In and out of RVR. Tachycardic to 120s this morning.  - Continue home Xarelto  20mg , metoprolol  XL 75mg  daily - on telemetry  Dementia with behavioral disturbance Continue home seroquel  50mg  in the am, seroquel  75mg  at bedtime, memantine  10mg  daily, melatonin 3mg  qus, buspirone   5mg  BID, citalopram  20mg  daily, Depakote  250mg  BID. PRN olanzapine  2.5mg  added - Family is working on memory care facility placement  Diet: Heart healthy VTE: Xarelto  Code: DNR  Dispo: Anticipated discharge to home   Redell Burnet, MD PGY-1 Internal Medicine Resident Pager Number [phone] Please contact the on call pager after 5 pm and on weekends at [phone].

## 2023-06-02 NOTE — Plan of Care (Signed)
   Problem: Clinical Measurements: Goal: Ability to maintain clinical measurements within normal limits will improve Outcome: Progressing   Problem: Clinical Measurements: Goal: Will remain free from infection Outcome: Progressing

## 2023-06-02 NOTE — Consult Note (Signed)
 Consultation Note Date: 06/02/2023   Patient Name: Marisa Nunez  DOB: [birth year]  MRN: [id number]  Age / Sex: 78 y.o., female  PCP: Lovie Mliss, MD Referring Physician: Forest Coy, MD  Reason for Consultation: Establishing goals of care  HPI/Patient Profile: 78 y.o. female  admitted on 05/27/2023 with advanced dementia, behavioral disturbances, atrial fibrillation on xarelto , HFrEF (LV EF 45-50% 05/09/23) PAF, breast CA, HTN, CVA, COPD with prior tobacco use, cardiomyopathy, and severe MR who presented to ED after cardiology OV (1/29) for concerns of worsening dyspnea.   Patient lives at home with daughter.  Multiple rehospitalization's and ER visits.  Most recently patient was discharged on 05/25/2023 after admission for atrial fib with RVR and altered mental status secondary to UTI.   It was recommended that she discharge to SNF but this was denied by her insurance, even after a peer to peer with our team. She was discharged home with her daughter and was seen by cardiology yesterday for follow up.   She had acute complaint of worsening dyspnea, found to be back in atrial fibrillation with RVR and volume overloaded. Felt to be acute on chronic heart failure exacerbation due to her severe valvular disease. She was directly admitted for treatment and work up for possible surgical repair of her severe MR / TR.    Patient does not have medical decision-making capacity.  Family face treatment option decisions, advanced directive decisions and anticipatory care needs.   Clinical Assessment and Goals of Care:  This NP Ronal Plants reviewed medical records, received report from team, assessed the patient and then meet at the patient's bedside and then spoke to daughter/Marisa Nunez by telephone  to discuss diagnosis, prognosis, GOC, EOL wishes disposition and options.   Concept of Palliative Care  was introduced as specialized medical care for people and their families living with serious illness.  If focuses on providing relief from the symptoms and stress of a serious illness.  The goal is to improve quality of life for both the patient and the family.  Values and goals of care important to patient and family were attempted to be elicited.  Created space and opportunity for family to explore thoughts and feelings regarding current medical situation.   Daughter verbalizes an understanding of the patient's complicated healthcare needs secondary to dementia and other co-morbidites Family is talking today with care patrol, they apparently are a service that helps family find disposition for patient's with specific care needs.   Education offered today regarding  the importance of continued conversation with family and the  medical providers regarding overall plan of care and treatment options,  ensuring decisions are within the context of the patients values and GOCs.  Meeting is planned for tomorrow afternoon at 1 PM for ongoing discussion regarding goals of care and treatment plan.  Family request presence of transition of care team, I communicated this with  Luise Pan LCSW        Questions and concerns addressed.  Family  encouraged to call with  questions or concerns.     PMT will continue to support holistically.           No H POA or advance care planning documents noted in EMR.   Daughter Marisa/Nunez /next of kin tells me she will bring records in for scanning tomorrow.       SUMMARY OF RECOMMENDATIONS    Code Status/Advance Care Planning: DNR    Palliative Prophylaxis:  Delirium Protocol and Frequent Pain Assessment  Additional Recommendations (Limitations, Scope, Preferences): Full Scope Treatment  Psycho-social/Spiritual:  Desire for further Chaplaincy support:no Additional Recommendations: emotional support  Prognosis:  Unable to  determine  Discharge Planning: To Be Determined      Primary Diagnoses: Present on Admission:  Mitral valve regurgitation  Persistent atrial fibrillation (HCC)  Dementia with behavioral disturbance (HCC)  Acute on chronic HFrEF (heart failure with reduced ejection fraction) (HCC)   I have reviewed the medical record, interviewed the patient and family, and examined the patient. The following aspects are pertinent.  Past Medical History:  Diagnosis Date   Anxiety    Breast cancer (HCC)    Chronic combined systolic and diastolic CHF (congestive heart failure) (HCC)    a. Echo 11/2021: LVEF of 40-45% with global hypokinesis, mildly reduced RV systolic function, and moderate MR   COPD (chronic obstructive pulmonary disease) (HCC)    Dementia (HCC)    Depression    History of TIA 10/30/2020   History of tobacco use 05/06/2017   Formatting of this note might be different from the original.  Greater than 40 pack year history. Quit in 2015.     HTN (hypertension)    Paroxysmal atrial fibrillation (HCC) 2017   Stroke [facility])    Social History   Socioeconomic History   Marital status: Divorced    Spouse name: Not on file   Number of children: Not on file   Years of education: Not on file   Highest education level: Not on file  Occupational History   Not on file  Tobacco Use   Smoking status: Former    Current packs/day: 0.50    Types: Cigarettes   Smokeless tobacco: Not on file  Vaping Use   Vaping status: Never Used  Substance and Sexual Activity   Alcohol use: Not Currently    Alcohol/week: 6.0 standard drinks of alcohol    Types: 6 Cans of beer per week   Drug use: Not Currently   Sexual activity: Not Currently  Other Topics Concern   Not on file  Social History Narrative   Not on file   Social Drivers of Health   Financial Resource Strain: Low Risk  (01/28/2023)   Overall Financial Resource Strain (CARDIA)    Difficulty of Paying Living Expenses: Not hard at all   Food Insecurity: No Food Insecurity (05/28/2023)   Hunger Vital Sign    Worried About Running Out of Food in the Last Year: Never true    Ran Out of Food in the Last Year: Never true  Transportation Needs: No Transportation Needs (05/28/2023)   PRAPARE - Administrator, Civil Service (Medical): No    Lack of Transportation (Non-Medical): No  Physical Activity: Sufficiently Active (01/28/2023)   Exercise Vital Sign    Days of Exercise per Week: 7 days    Minutes of Exercise per Session: 50 min  Stress: No Stress Concern Present (01/28/2023)   [employer] of Occupational Health - Occupational Stress Questionnaire    Feeling of Stress :  Not at all  Social Connections: Unknown (05/28/2023)   Social Connection and Isolation Panel [NHANES]    Frequency of Communication with Friends and Family: Three times a week    Frequency of Social Gatherings with Friends and Family: More than three times a week    Attends Religious Services: Never    Database Administrator or Organizations: Patient declined    Attends Engineer, Structural: Patient unable to answer    Marital Status: Divorced   Family History  Problem Relation Age of Onset   Arrhythmia Father    Scheduled Meds:  busPIRone   5 mg Oral BID   citalopram   20 mg Oral Daily   divalproex   250 mg Oral BID   furosemide   40 mg Oral Daily   lidocaine   1 patch Transdermal Q24H   melatonin  3 mg Oral QHS   memantine   10 mg Oral BID   metoprolol  succinate  75 mg Oral Daily   QUEtiapine   50 mg Oral q AM   QUEtiapine   75 mg Oral QHS   rivaroxaban   15 mg Oral Q supper   Continuous Infusions:  sodium chloride      PRN Meds:.acetaminophen , albuterol , OLANZapine  Medications Prior to Admission:  Prior to Admission medications   Medication Sig Start Date End Date Taking? Authorizing Provider  acetaminophen  (TYLENOL ) 500 MG tablet Take 1 tablet (500 mg total) by mouth every 6 (six) hours as needed for moderate pain, fever  or mild pain. 10/24/21  Yes Pokhrel, Laxman, MD  albuterol  (PROVENTIL ) (2.5 MG/3ML) 0.083% nebulizer solution Take 3 mLs (2.5 mg total) by nebulization every 4 (four) hours as needed for shortness of breath or wheezing. 12/29/22  Yes Onuoha, Josephine C, NP  albuterol  (VENTOLIN  HFA) 108 (90 Base) MCG/ACT inhaler Inhale 1-2 puffs into the lungs every 6 (six) hours as needed. Patient taking differently: Inhale 1-2 puffs into the lungs every 6 (six) hours as needed for shortness of breath. 05/04/23  Yes Mayer, Jodi R, NP  busPIRone  (BUSPAR ) 5 MG tablet Take 1 tablet (5 mg total) by mouth 2 (two) times daily. 05/25/23  Yes Francella Rogue, MD  citalopram  (CELEXA ) 20 MG tablet Take 1 tablet (20 mg total) by mouth daily. 05/26/23  Yes Francella Rogue, MD  divalproex  (DEPAKOTE  SPRINKLE) 125 MG capsule Take 2 capsules (250 mg total) by mouth 2 (two) times daily. 04/06/23 05/27/23 Yes Lovie Clarity, MD  Melatonin 10 MG TABS Take 10 mg by mouth at bedtime.   Yes [provider]  memantine  (NAMENDA ) 10 MG tablet Take 1 tablet (10 mg total) by mouth 2 (two) times daily. 05/25/23  Yes Francella Rogue, MD  metoprolol  succinate (TOPROL -XL) 50 MG 24 hr tablet Take 1 tablet (50 mg total) by mouth daily. Take with or immediately following a meal. 01/28/23 05/27/23 Yes Lovie Clarity, MD  Multiple Vitamins-Minerals (MULTIVITAMIN WITH MINERALS) tablet Take 1 tablet by mouth daily with breakfast.   Yes [provider]  ondansetron  (ZOFRAN -ODT) 4 MG disintegrating tablet Take 4 mg by mouth every 8 (eight) hours as needed for vomiting or nausea (dissolve orally). 06/29/22  Yes [provider]  QUEtiapine  (SEROQUEL ) 50 MG tablet Take 1.5 tablets (75 mg total) by mouth at bedtime. 05/25/23  Yes Francella Rogue, MD  rivaroxaban  (XARELTO ) 20 MG TABS tablet Take 1 tablet (20 mg total) by mouth daily with supper. Do not take with Eliquis  05/11/23 08/09/23 Yes Lovie Clarity, MD  benzonatate  (TESSALON ) 100 MG capsule Take 1 capsule  (100 mg  total) by mouth every 8 (eight) hours. Patient not taking: Reported on 05/27/2023 05/04/23   Mayer, Jodi R, NP  furosemide  (LASIX ) 20 MG tablet Take 2 tablets (40 mg total) by mouth daily. 01/20/23   Lovie Clarity, MD   Allergies  Allergen Reactions   Influenza Virus Vaccine Hives   Myrbetriq  [Mirabegron  Er] Other (See Comments)    Confusion, agitation, hallucinations, psychosis   Pneumococcal Vaccine Hives   Ativan  [Lorazepam ] Other (See Comments)    Made the patient agitated,  per daughter    Review of Systems  Unable to perform ROS: Dementia    Physical Exam Cardiovascular:     Rate and Rhythm: Normal rate.  Pulmonary:     Effort: Pulmonary effort is normal.  Skin:    General: Skin is warm and dry.  Neurological:     Mental Status: She is alert.     Vital Signs: BP (!) 101/58 (BP Location: Left Arm)   Pulse 70   Temp 97.8 F (36.6 C) (Oral)   Resp 17   Ht 5' 4 (1.626 m)   Wt 48.1 kg   LMP  (LMP Unknown)   SpO2 98%   BMI 18.20 kg/m  Pain Scale: 0-10   Pain Score: 0-No pain   SpO2: SpO2: 98 % O2 Device:SpO2: 98 % O2 Flow Rate: .O2 Flow Rate (L/min): 2 L/min  IO: Intake/output summary:  Intake/Output Summary (Last 24 hours) at 06/02/2023 1131 Last data filed at 06/02/2023 1118 Gross per 24 hour  Intake 180 ml  Output 775 ml  Net -595 ml    LBM: Last BM Date : 05/29/23 Baseline Weight: Weight: 51.7 kg Most recent weight: Weight: 48.1 kg     Palliative Assessment/Data:  30 %, currently with sitter     Time 75 minutes  Discussed with treatment team via secure chat  Signed by: Ronal Plants, NP   Please contact Palliative Medicine Team phone at [phone] for questions and concerns.  For individual provider: See Tracey

## 2023-06-02 NOTE — TOC Progression Note (Addendum)
 Transition of Care [facility]) - Progression Note    Patient Details  Name: Marisa Nunez MRN: [id number] Date of Birth: 09-14-1945  Transition [facility]) CM/SW Contact  [name], [place] Phone Number: 06/02/2023, 10:31 AM  Clinical Narrative:   Per [facility] leadership, Josefa, CSW will need to know the name of memory care facility to send clinicals to. CSW informed Edsel, w/ care patrol, of above information.  CSW notfied MD that pt will need TB test done for memory care placement.   11:53 AM Danielle called CSW and stated the potential memory care facility (if family accepts and they have bed availability) is [facility].  12:43 PM CSW called pts dtr, [name], and Stephanie's husband answered. They wanted to ask CSW questions regarding medicaid. CSW informed them that they can contact DSS regarding specific medicaid questions. CSW informed them that I reached out to financial counseling regarding medicaid screening.   TOC will continue to follow.   Expected Discharge Plan: Memory Care Barriers to Discharge: Continued Medical Work up  Expected Discharge Plan and Services In-house Referral: Clinical Social Work     Living arrangements for the past 2 months: Single Family Home                   DME Agency: NA                   Social Determinants of Health (SDOH) Interventions SDOH Screenings   Food Insecurity: No Food Insecurity (05/28/2023)  Housing: Low Risk  (05/28/2023)  Transportation Needs: No Transportation Needs (05/28/2023)  Utilities: Not At Risk (05/28/2023)  Alcohol Screen: Low Risk  (01/28/2023)  Depression (PHQ2-9): Low Risk  (01/28/2023)  Financial Resource Strain: Low Risk  (01/28/2023)  Physical Activity: Sufficiently Active (01/28/2023)  Social Connections: Unknown (05/28/2023)  Stress: No Stress Concern Present (01/28/2023)  Tobacco Use: Medium Risk (05/27/2023)  Health Literacy: Adequate Health Literacy (01/28/2023)    Readmission Risk  Interventions    11/28/2021   12:13 PM  Readmission Risk Prevention Plan  Transportation Screening Complete  PCP or Specialist Appt within 3-5 Days Complete  HRI or Home Care Consult Complete  Social Work Consult for Recovery Care Planning/Counseling Complete  Palliative Care Screening Not Applicable  Medication Review Oceanographer) Complete

## 2023-06-02 NOTE — NC FL2 (Signed)
 Marisa Nunez  MEDICAID FL2 LEVEL OF CARE FORM     IDENTIFICATION  Patient Name: Marisa Nunez Birthdate: 1946/01/23 Sex: female Admission Date (Current Location): 05/27/2023  [facility] and [place] Number:  Producer, Television/film/video and Address:  The [place], [address], [place], [place] [address]      Provider Number: [id number]  Attending Physician Name and Address:  Forest Coy, MD  Relative Name and Phone Number:  Marisa Nunez; [phone]; Daughter    Current Level of Care: Hospital Recommended Level of Care: Memory Care Prior Approval Number:    Date Approved/Denied: 05/20/23 PASRR Number: [id number] (Approved for 05/20/2023 until 06/19/2023)  Discharge Plan: Other (Comment) (Memory Care)    Current Diagnoses: Patient Active Problem List   Diagnosis Date Noted   PAF (paroxysmal atrial fibrillation) (HCC) 06/01/2023   Nonrheumatic mitral valve regurgitation 05/27/2023   Mitral valve regurgitation 05/27/2023   Anxiety state 05/17/2023   UTI (urinary tract infection) 05/15/2023   Altered mental status 05/15/2023   Acute on chronic HFrEF (heart failure with reduced ejection fraction) (HCC) 05/08/2023   Pleural effusion on right 05/08/2023   Goals of care, counseling/discussion 01/28/2023   Dementia with behavioral disturbance (HCC) 12/26/2022   History of cerebral infarction 10/29/2020   History of right breast cancer 02/25/2018   Mixed hyperlipidemia 05/06/2017   Osteopenia of multiple sites 05/06/2017   Essential hypertension 10/25/2015   Persistent atrial fibrillation (HCC) 10/25/2015    Orientation RESPIRATION BLADDER Height & Weight     Self  Normal Continent Weight: 106 lb 0.7 oz (48.1 kg) Height:  5' 4 (162.6 cm)  BEHAVIORAL SYMPTOMS/MOOD NEUROLOGICAL BOWEL NUTRITION STATUS      Continent Diet (See dc summary)  AMBULATORY STATUS COMMUNICATION OF NEEDS Skin   Limited Assist Verbally Normal                        Personal Care Assistance Level of Assistance  Bathing, Feeding, Dressing Bathing Assistance: Limited assistance Feeding assistance: Limited assistance Dressing Assistance: Limited assistance     Functional Limitations Info  Sight, Speech, Hearing Sight Info: Adequate Hearing Info: Adequate Speech Info: Adequate    SPECIAL CARE FACTORS FREQUENCY  PT (By licensed PT), OT (By licensed OT)     PT Frequency: 5x week OT Frequency: 5x week            Contractures Contractures Info: Not present    Additional Factors Info  Code Status Code Status Info: DNR- Limited- Do Not Intubate/DNI Allergies Info: Influenza Virus Vaccine; Pneumococcal Vaccine; Myrbetriq  (mirabegron  Er); Ativan  (lorazepam )           Current Medications (06/02/2023):  This is the current hospital active medication list Current Facility-Administered Medications  Medication Dose Route Frequency Provider Last Rate Last Admin   0.9 %  sodium chloride  infusion   Intravenous Continuous O'Neal, Darryle Ned, MD       acetaminophen  (TYLENOL ) tablet 650 mg  650 mg Oral Q6H PRN Zheng, Michael, DO   650 mg at 06/01/23 1516   albuterol  (PROVENTIL ) (2.5 MG/3ML) 0.083% nebulizer solution 2.5 mg  2.5 mg Inhalation Q6H PRN Nooruddin, Saad, MD       busPIRone  (BUSPAR ) tablet 5 mg  5 mg Oral BID Nooruddin, Saad, MD   5 mg at 06/01/23 2009   citalopram  (CELEXA ) tablet 20 mg  20 mg Oral Daily Nooruddin, Saad, MD   20 mg at 06/01/23 1000   divalproex  (DEPAKOTE  SPRINKLE) capsule 250  mg  250 mg Oral BID Nooruddin, Saad, MD   250 mg at 06/01/23 2009   furosemide  (LASIX ) tablet 40 mg  40 mg Oral Daily Francella Rogue, MD   40 mg at 06/01/23 1423   lidocaine  (LIDODERM ) 5 % 1 patch  1 patch Transdermal Q24H Francella Rogue, MD   1 patch at 06/01/23 1422   melatonin tablet 3 mg  3 mg Oral QHS Nooruddin, Saad, MD   3 mg at 06/01/23 2008   memantine  (NAMENDA ) tablet 10 mg  10 mg Oral BID Nooruddin, Saad, MD   10 mg at 06/01/23 2009    metoprolol  succinate (TOPROL -XL) 24 hr tablet 75 mg  75 mg Oral Daily Zhao, Xika, NP   75 mg at 06/01/23 1000   OLANZapine  (ZYPREXA ) tablet 2.5 mg  2.5 mg Oral Daily PRN Francella Rogue, MD   2.5 mg at 05/31/23 1047   QUEtiapine  (SEROQUEL ) tablet 50 mg  50 mg Oral q AM Francella Rogue, MD   50 mg at 06/02/23 0804   QUEtiapine  (SEROQUEL ) tablet 75 mg  75 mg Oral QHS Zheng, Michael, DO   75 mg at 06/01/23 1856   Rivaroxaban  (XARELTO ) tablet 15 mg  15 mg Oral Q supper Francella Rogue, MD         Discharge Medications: Please see discharge summary for a list of discharge medications.  Relevant Imaging Results:  Relevant Lab Results:   Additional Information SS# [id number]  Marisa Nunez Marisa Nunez, LCSWA

## 2023-06-02 NOTE — Progress Notes (Signed)
 Mobility Specialist Progress Note:   06/02/23 1130  Mobility  Activity Ambulated with assistance in hallway  Level of Assistance Standby assist, set-up cues, supervision of patient - no hands on  Assistive Device Front wheel walker  Distance Ambulated (ft) 200 ft  Activity Response Tolerated well  Mobility Referral Yes  Mobility visit 1 Mobility  Mobility Specialist Start Time (ACUTE ONLY) 1200  Mobility Specialist Stop Time (ACUTE ONLY) 1215  Mobility Specialist Time Calculation (min) (ACUTE ONLY) 15 min   Pt agreeable to mobility session. Required no physical assistance throughout ambulation with RW. Pt c/o BLE weakness with exertion, denies significant back pain today. Pt left with all needs met, in room with sitter.   Therisa Rana Mobility Specialist Please contact via SecureChat or  Rehab office at [phone]

## 2023-06-02 NOTE — Plan of Care (Signed)

## 2023-06-03 ENCOUNTER — Encounter: Payer: Medicare Other | Admitting: Internal Medicine

## 2023-06-03 ENCOUNTER — Inpatient Hospital Stay (HOSPITAL_COMMUNITY): Payer: Medicare Other

## 2023-06-03 DIAGNOSIS — I5023 Acute on chronic systolic (congestive) heart failure: Secondary | ICD-10-CM | POA: Diagnosis not present

## 2023-06-03 DIAGNOSIS — Z7189 Other specified counseling: Secondary | ICD-10-CM | POA: Diagnosis not present

## 2023-06-03 DIAGNOSIS — I340 Nonrheumatic mitral (valve) insufficiency: Secondary | ICD-10-CM | POA: Diagnosis not present

## 2023-06-03 DIAGNOSIS — F03918 Unspecified dementia, unspecified severity, with other behavioral disturbance: Secondary | ICD-10-CM | POA: Diagnosis not present

## 2023-06-03 LAB — BASIC METABOLIC PANEL
Anion gap: 9 (ref 5–15)
BUN: 38 mg/dL — ABNORMAL HIGH (ref 8–23)
CO2: 32 mmol/L (ref 22–32)
Calcium: 9 mg/dL (ref 8.9–10.3)
Chloride: 94 mmol/L — ABNORMAL LOW (ref 98–111)
Creatinine, Ser: 1.18 mg/dL — ABNORMAL HIGH (ref 0.44–1.00)
GFR, Estimated: 48 mL/min — ABNORMAL LOW (ref >60)
Glucose, Bld: 100 mg/dL — ABNORMAL HIGH (ref 70–99)
Potassium: 4.2 mmol/L (ref 3.5–5.1)
Sodium: 135 mmol/L (ref 135–145)

## 2023-06-03 MED ORDER — OXYCODONE HCL 5 MG PO TABS
5.0000 mg | ORAL_TABLET | Freq: Once | ORAL | Status: AC
  Administered 2023-06-03: 5 mg via ORAL
  Filled 2023-06-03: qty 1

## 2023-06-03 MED ORDER — TUBERCULIN PPD 5 UNIT/0.1ML ID SOLN
5.0000 [IU] | Freq: Once | INTRADERMAL | Status: DC
  Administered 2023-06-03: 5 [IU] via INTRADERMAL
  Filled 2023-06-03: qty 0.1

## 2023-06-03 NOTE — Discharge Summary (Addendum)
 Name: Marisa Nunez MRN: [id number] DOB: Sep 12, 1945 78 y.o. PCP: Lovie Mliss, MD  Date of Admission: 05/27/2023  1:36 PM Date of Discharge: 06/04/23 Attending Physician: Dr. Forest  Discharge Diagnosis: Principal Problem:   Mitral valve regurgitation Active Problems:   Persistent atrial fibrillation (HCC)   Dementia with behavioral disturbance (HCC)   Goals of care, counseling/discussion   Acute on chronic HFrEF (heart failure with reduced ejection fraction) (HCC)   Nonrheumatic mitral valve regurgitation   PAF (paroxysmal atrial fibrillation) (HCC)    Discharge Medications: Allergies as of 06/04/2023       Reactions   Influenza Virus Vaccine Hives   Myrbetriq  [mirabegron  Er] Other (See Comments)   Confusion, agitation, hallucinations, psychosis   Pneumococcal Vaccine Hives   Ativan  [lorazepam ] Other (See Comments)   Made the patient agitated,  per daughter         Medication List     TAKE these medications    acetaminophen  500 MG tablet Commonly known as: TYLENOL  Take 1 tablet (500 mg total) by mouth every 6 (six) hours as needed for moderate pain, fever or mild pain.   albuterol  (2.5 MG/3ML) 0.083% nebulizer solution Commonly known as: PROVENTIL  Take 3 mLs (2.5 mg total) by nebulization every 4 (four) hours as needed for shortness of breath or wheezing. What changed: Another medication with the same name was changed. Make sure you understand how and when to take each.   albuterol  108 (90 Base) MCG/ACT inhaler Commonly known as: VENTOLIN  HFA Inhale 1-2 puffs into the lungs every 6 (six) hours as needed. What changed: reasons to take this   benzonatate  100 MG capsule Commonly known as: TESSALON  Take 1 capsule (100 mg total) by mouth every 8 (eight) hours.   busPIRone  5 MG tablet Commonly known as: BUSPAR  Take 1 tablet (5 mg total) by mouth 2 (two) times daily.   citalopram  20 MG tablet Commonly known as: CeleXA  Take 1 tablet (20 mg total) by mouth  daily.   divalproex  125 MG capsule Commonly known as: DEPAKOTE  SPRINKLE Take 2 capsules (250 mg total) by mouth 2 (two) times daily.   furosemide  20 MG tablet Commonly known as: LASIX  Take 2 tablets (40 mg total) by mouth daily.   Melatonin 10 MG Tabs Take 10 mg by mouth at bedtime.   memantine  10 MG tablet Commonly known as: NAMENDA  Take 1 tablet (10 mg total) by mouth 2 (two) times daily.   metoprolol  succinate 25 MG 24 hr tablet Commonly known as: TOPROL -XL Take 3 tablets (75 mg total) by mouth daily. Take with or immediately following a meal. What changed:  medication strength how much to take   multivitamin with minerals tablet Take 1 tablet by mouth daily with breakfast.   ondansetron  4 MG disintegrating tablet Commonly known as: ZOFRAN -ODT Take 4 mg by mouth every 8 (eight) hours as needed for vomiting or nausea (dissolve orally).   QUEtiapine  50 MG tablet Commonly known as: SEROQUEL  Take 1 tablet (50 mg total) by mouth in the morning AND 1.5 tablets (75 mg total) at bedtime. What changed: See the new instructions.   Rivaroxaban  15 MG Tabs tablet Commonly known as: XARELTO  Take 1 tablet (15 mg total) by mouth daily with supper. What changed:  medication strength how much to take additional instructions       Disposition and follow-up:   [name] was discharged from [facility] in Stable condition.  At the hospital follow up visit please address:  1.  Follow-up:  HFrEF: volume status, ensure patient is euvolemic and breathing at baseline  2.  Labs / imaging needed at time of follow-up: None  3.  Pending labs/ test needing follow-up: None  4.  Medication Changes  Started: Resume Lasix  40mg  daily  Stopped:  Changed: Metoprolol  dose increased to 75mg  daily, Xarelto  dose decreased to 15mg   Abx -   End Date:  Follow-up Appointments: [facility] on 2/12 at [facility] Course by problem list:  Acute heart failure  exacerbation Severe mitral regurgitation Severe tricuspid regurgitation Patient presented to the ED from her cardiologist office due to complaints of dyspnea and concern for acute CHF exacerbation. Cardiology was consulted, who recommended TEE to evaluate for possible MTEER, as well as IV Lasix . Patient responded well to diuresis and quickly returned to baseline respiratory status. On the day of planned TEE patient was too agitated to have the procedure performed. Her agitation due to dementia is unlikely to resolve, so no further cardiac interventions cannot be offered. Patient was euvolemic on home oral Lasix  dose on day of discharge to memory care facility.   Atrial fibrillation with RVR Patient had runs of RVR during admission, generally rate controlled with decreased dose Xarelto  15mg  per pharmacy and metoprolol  increased to 75mg  daily by cardiology.   Dementia with behavioral disturbances Home medication regimen including seroquel  50mg  qam, seroquel  75mg  at bedtime, memantine  10mg  daily, melatonin 3mg  at bedtime, buspirone  5mg  BID, citalopram  20mg  daily, depakote  250mg  BID, prn olanzapine  2.5mg  was continued during admission.    Discharge Subjective: Patient feels very well today. Alert and interactive, no complaints. Denies shortness of breath, chest pain, back pain.   Discharge Exam:   BP 110/73 (BP Location: Left Arm)   Pulse 70   Temp 98.3 F (36.8 C) (Oral)   Resp 18   Ht 5' 4 (1.626 m)   Wt 48.3 kg   LMP  (LMP Unknown)   SpO2 90%   BMI 18.28 kg/m   Constitutional: elderly cachexic woman, in no acute distress Cardiovascular: irregular rhythm, systolic murmur Pulmonary/Chest: CTAB, no wheezes Abdominal: soft, nontender Neuro: no focal deficit noted  Pertinent Labs, Studies, and Procedures:     Latest Ref Rng & Units 05/27/2023    2:38 PM 05/25/2023    7:09 AM 05/17/2023    4:17 AM  CBC  WBC 4.0 - 10.5 K/uL 11.0  9.4  9.3   Hemoglobin 12.0 - 15.0 g/dL 87.6  87.9  88.7    Hematocrit 36.0 - 46.0 % 38.7  36.9  34.7   Platelets 150 - 400 K/uL 368  333  331        Latest Ref Rng & Units 06/04/2023    8:34 AM 06/03/2023    7:53 AM 06/02/2023    2:35 AM  CMP  Glucose 70 - 99 mg/dL 882  899  88   BUN 8 - 23 mg/dL 28  38  40   Creatinine 0.44 - 1.00 mg/dL 9.00  8.81  8.79   Sodium 135 - 145 mmol/L 136  135  135   Potassium 3.5 - 5.1 mmol/L 3.7  4.2  4.2   Chloride 98 - 111 mmol/L 95  94  94   CO2 22 - 32 mmol/L 30  32  31   Calcium  8.9 - 10.3 mg/dL 9.1  9.0  8.9     DG Chest 2 View Result Date: 05/27/2023 CLINICAL DATA:  Shortness of breath EXAM: CHEST - 2 VIEW COMPARISON:  Chest radiograph dated 05/14/2023. FINDINGS: No focal consolidation, pleural effusion, or pneumothorax. The cardiac silhouette is within normal limits. Atherosclerotic calcification of the aorta. No acute osseous pathology. Degenerative changes of the spine and scoliosis. Right shoulder arthroplasty. IMPRESSION: No active cardiopulmonary disease. Electronically Signed   By: Vanetta Chou M.D.   On: 05/27/2023 16:00   Signed: Redell Burnet, MD PGY-1 06/04/2023, 11:10 AM   Pager: [phone]

## 2023-06-03 NOTE — Progress Notes (Addendum)
 Paged by RN at 22:45 about fall and head trauma.  Patient is evaluated at bedside, she is sitting beside the bed, does not appear in any distress. Denies any headaches or pain. Reports that she is doing well. Reports that she fell and hit her head, but no injury. She is Alert, answer questions appropriately. No acute focal deficits noted.   Sitter in the room.   Incident report obtained from RN Donnice, states that she wanted to ambulate in the hallway, so he assisted her. She was ambulating in the hallway using her walker. Patient did not wanted the current sitter in the room, so RN turned to make a call for another [facility], at that moment, she had fallen backwards.   CTH wo contrast ordered.

## 2023-06-03 NOTE — Progress Notes (Signed)
 Patient expressing some amount of agitation and refuses assessment (Whatever, I just want to sleep). Patient was, however, amenable to taking PO medications. All provided as ordered. No complaints of pain but complaints of congestion; unable to corroborate with RN assessment due to aforementioned refusal. Patient appears to be in no acute distress and on conclusion of assessment ambulated independently to the restroom. Sitter at bedside.

## 2023-06-03 NOTE — NC FL2 (Addendum)
 [place]  MEDICAID FL2 LEVEL OF CARE FORM     IDENTIFICATION  Patient Name: Marisa Nunez Birthdate: 05-Jun-1945 Sex: female Admission Date (Current Location): 05/27/2023  [facility] and [place] Number:  Producer, Television/film/video and Address:  The [place]. [facility], [address], [place], [place] [address]      Provider Number: [id number]  Attending Physician Name and Address:  Forest Coy, MD  Relative Name and Phone Number:  Leonia Eaves; [phone]; Daughter    Current Level of Care: Hospital Recommended Level of Care: Other (Comment) ([facility]HAS) Prior Approval Number:    Date Approved/Denied: 05/20/23 PASRR Number: [id number] (Approved for 05/20/2023 until 06/19/2023)  Discharge Plan: Other (Comment) (Memory Care)    Current Diagnoses: Patient Active Problem List   Diagnosis Date Noted   PAF (paroxysmal atrial fibrillation) (HCC) 06/01/2023   Nonrheumatic mitral valve regurgitation 05/27/2023   Mitral valve regurgitation 05/27/2023   Anxiety state 05/17/2023   UTI (urinary tract infection) 05/15/2023   Altered mental status 05/15/2023   Acute on chronic HFrEF (heart failure with reduced ejection fraction) (HCC) 05/08/2023   Pleural effusion on right 05/08/2023   Goals of care, counseling/discussion 01/28/2023   Dementia with behavioral disturbance (HCC) 12/26/2022   History of cerebral infarction 10/29/2020   History of right breast cancer 02/25/2018   Mixed hyperlipidemia 05/06/2017   Osteopenia of multiple sites 05/06/2017   Essential hypertension 10/25/2015   Persistent atrial fibrillation (HCC) 10/25/2015    Orientation RESPIRATION BLADDER Height & Weight     Self  Normal Continent Weight: 106 lb 7.7 oz (48.3 kg) Height:  5' 4 (162.6 cm)  BEHAVIORAL SYMPTOMS/MOOD NEUROLOGICAL BOWEL NUTRITION STATUS      Continent Diet (See dc summary)  AMBULATORY STATUS COMMUNICATION OF NEEDS Skin   Limited Assist Verbally Normal                        Personal Care Assistance Level of Assistance  Bathing, Feeding, Dressing Bathing Assistance: Limited assistance Feeding assistance: Limited assistance Dressing Assistance: Limited assistance     Functional Limitations Info  Sight, Speech, Hearing Sight Info: Adequate Hearing Info: Adequate Speech Info: Adequate    SPECIAL CARE FACTORS FREQUENCY  PT (By licensed PT), OT (By licensed OT)     PT Frequency: 5x week OT Frequency: 5x week            Contractures Contractures Info: Not present    Additional Factors Info  Code Status Code Status Info: DNR- Limited- Do Not Intubate/DNI Allergies Info: Influenza Virus Vaccine; Pneumococcal Vaccine; Myrbetriq  (mirabegron  Er); Ativan  (lorazepam )           Current Medications (06/03/2023):  This is the current hospital active medication list Current Facility-Administered Medications  Medication Dose Route Frequency Provider Last Rate Last Admin   acetaminophen  (TYLENOL ) tablet 650 mg  650 mg Oral Q6H PRN Zheng, Michael, DO   650 mg at 06/03/23 9251   albuterol  (PROVENTIL ) (2.5 MG/3ML) 0.083% nebulizer solution 2.5 mg  2.5 mg Inhalation Q6H PRN Nooruddin, Saad, MD       busPIRone  (BUSPAR ) tablet 5 mg  5 mg Oral BID Nooruddin, Saad, MD   5 mg at 06/03/23 1004   citalopram  (CELEXA ) tablet 20 mg  20 mg Oral Daily Nooruddin, Saad, MD   20 mg at 06/03/23 1004   divalproex  (DEPAKOTE  SPRINKLE) capsule 250 mg  250 mg Oral BID Nooruddin, Saad, MD   250 mg at 06/03/23 9071   furosemide  (  LASIX ) tablet 40 mg  40 mg Oral Daily Francella Rogue, MD   40 mg at 06/03/23 1004   lidocaine  (LIDODERM ) 5 % 1 patch  1 patch Transdermal Q24H Francella Rogue, MD   1 patch at 06/02/23 1403   melatonin tablet 3 mg  3 mg Oral QHS Nooruddin, Saad, MD   3 mg at 06/03/23 0037   memantine  (NAMENDA ) tablet 10 mg  10 mg Oral BID Nooruddin, Saad, MD   10 mg at 06/03/23 1004   metoprolol  succinate (TOPROL -XL) 24 hr tablet 75 mg  75 mg Oral Daily Zhao, Xika, NP    75 mg at 06/03/23 1004   OLANZapine  (ZYPREXA ) tablet 2.5 mg  2.5 mg Oral Daily PRN Francella Rogue, MD   2.5 mg at 06/03/23 0038   QUEtiapine  (SEROQUEL ) tablet 50 mg  50 mg Oral q AM Francella Rogue, MD   50 mg at 06/03/23 9371   QUEtiapine  (SEROQUEL ) tablet 75 mg  75 mg Oral QHS Zheng, Michael, DO   75 mg at 06/02/23 1806   Rivaroxaban  (XARELTO ) tablet 15 mg  15 mg Oral Q supper Francella Rogue, MD   15 mg at 06/02/23 1807   tuberculin injection 5 Units  5 Units Intradermal Once Guilloud, Carolyn, MD   5 Units at 06/03/23 1312     Discharge Medications: Please see discharge summary for a list of discharge medications.  Relevant Imaging Results:  Relevant Lab Results:   Additional Information SS# [id number]  Manoah Deckard C Menucha Dicesare, LCSWA

## 2023-06-03 NOTE — Consult Note (Addendum)
 [facility] Liaison Consult Note    06/03/2023  JASNOOR TRUSSELL [birth year] [id number]  Covering Richerd Fish, RN [facility] hospital liaison)  Update: Primary Care Provider: Lovie Mliss, MD  [facility] documentation indicates pt is pending discharge disposition for memory care facility [facility]) but no confirmed.    [employer] [place]) continue to work on this case for post hospital placement.   No family at bedside for education on services for [facility] this morning.  [facility] hospital liaison does not interfere with TOC discharge planning arrangements for patients discharging from the hospital.  Please contact hospital liaison with any related question:  Olam Ku, RN, [facility] Liaison [place]   [facility], Population Health Office Hours MTWF  8:00 am-6:00 pm Direct Dial: [phone] mobile [phone] [Office toll free line] Office Hours are M-F 8:30 - 5 pm [email or web address]

## 2023-06-03 NOTE — Telephone Encounter (Signed)
 Called to speak with patients daughter in regards to next steps for treatment of her MR. Discussed medical management with focus on diuretics. Patient and family met with Palliative Medicine with plans for SNF discharge which the patients daughter is very thankful. Ensured her that our team will provide therapy recommendations and palliative care will assist with outreach care. Thankful for the call.   Kate Minus NP-C Structural Heart Team  Pager: [phone] Phone: [phone]'

## 2023-06-03 NOTE — Progress Notes (Signed)
 Occupational Therapy Treatment Patient Details Name: Marisa Nunez MRN: [id number] DOB: 07-11-1945 Today's Date: 06/03/2023   History of present illness Marisa Nunez is a 78 y.o. female admitted 05/27/23 for CHF exacerbation. Of note, pt was d/c 1/27 with RW. 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   OT comments  OT session focused on training in techniques for increased safety and independence with ADL and functional transfers/mobility during/ in preparation for functional tasks. Pt demonstrating ability to complete ADLs Independent to Supervision for safety and functional transfers/mobility without an AD largely Independent to Supervision for safety with brief Contact guard assist/hand held assist +1 this session for increased safety due to fatigue. Pt requires Supervision for safety due to impaired cognition and occasional to Min cues for initiation, safety, and to maintain attention during functional tasks. Pt participated well in session and is making progress toward goals. Pt will benefit from continued acute skilled OT services to address deficits below and increase safety and independence with functional tasks. No post-acute skilled OT needs anticipated at this time.       If plan is discharge home, recommend the following:  A little help with walking and/or transfers;A little help with bathing/dressing/bathroom;Assistance with cooking/housework;Direct supervision/assist for financial management;Direct supervision/assist for medications management;Assist for transportation;Help with stairs or ramp for entrance;Supervision due to cognitive status   Equipment Recommendations  None recommended by OT    Recommendations for Other Services      Precautions / Restrictions Precautions Precautions: Fall Restrictions Weight Bearing Restrictions Per Provider Order: No        Mobility Bed Mobility Overal bed mobility: Modified Independent                  Transfers Overall transfer level: Needs assistance Equipment used: None Transfers: Sit to/from Stand, Bed to chair/wheelchair/BSC Sit to Stand: Independent     Step pivot transfers: Supervision     General transfer comment: Pt benefits from Supervision for safety during funcitonal trasnfedrs due to impaired safety awareness and occasional impulsiveness with movement. No physical assist needed.     Balance Overall balance assessment: Mild deficits observed, not formally tested                                         ADL either performed or assessed with clinical judgement   ADL Overall ADL's : Needs assistance/impaired Eating/Feeding: Independent;Sitting   Grooming: Wash/dry hands;Oral care;Supervision/safety;Standing (cues for initiation; cues to maintain attention to tasks)           Upper Body Dressing : Supervision/safety;Sitting (cues for initaiton)       Toilet Transfer: Supervision/safety;Ambulation;Regular Toilet;Cueing for safety   Toileting- Clothing Manipulation and Hygiene: Supervision/safety;Sitting/lateral lean;Sit to/from stand       Functional mobility during ADLs: Supervision/safety; Contact guard assist (No AD to hand held assist +1 for safety as pt fatigued)      Extremity/Trunk Assessment Upper Extremity Assessment Upper Extremity Assessment: Right hand dominant;Overall [facility] for tasks assessed   Lower Extremity Assessment Lower Extremity Assessment: Defer to PT evaluation        Vision       Perception     Praxis      Cognition Arousal: Alert Behavior During Therapy: [facility] for tasks assessed/performed Overall Cognitive Status: History of cognitive impairments -  at baseline (dementia)                                 General Comments: Pt is oriented to herself, but not place, time, or situation. Pt follows  simple commands, is easily distracted, and is occasionally impulsive with movement. Requires VC/TC intermittently for safety and to maintain attention to task. Pt pleasant throughout session.        Exercises      Shoulder Instructions       General Comments Pt sitter present throughout session    Pertinent Vitals/ Pain       Pain Assessment Pain Assessment: No/denies pain  Home Living                                          Prior Functioning/Environment              Frequency  Min 1X/week        Progress Toward Goals  OT Goals(current goals can now be found in the care plan section)  Progress towards OT goals: Progressing toward goals  Acute Rehab OT Goals Patient Stated Goal: pt did not state this session  Plan      Co-evaluation                 AM-PAC OT 6 Clicks Daily Activity     Outcome Measure   Help from another person eating meals?: None Help from another person taking care of personal grooming?: A Little Help from another person toileting, which includes using toliet, bedpan, or urinal?: A Little Help from another person bathing (including washing, rinsing, drying)?: A Little Help from another person to put on and taking off regular upper body clothing?: A Little Help from another person to put on and taking off regular lower body clothing?: A Little 6 Click Score: 19    End of Session    OT Visit Diagnosis: Other symptoms and signs involving cognitive function   Activity Tolerance Patient tolerated treatment well   Patient Left in bed;with call bell/phone within reach;with nursing/sitter in room   Nurse Communication Mobility status        Time: 8569-8551 OT Time Calculation (min): 18 min  Charges: OT General Charges $OT Visit: 1 Visit OT Treatments $Self Care/Home Management : 8-22 mins  Margarie Rockey HERO., OTR/L, MA Acute Rehab [phone]   Margarie FORBES Horns 06/03/2023, 3:03 PM

## 2023-06-03 NOTE — Progress Notes (Signed)
                  Subjective:   Summary: Patient is a 78 year old woman with dementia with behavioral disturbances, atrial fibrillation on Xarelto , HFrEF w/EF 45-50%, severe mitral regurgitation, CVA, COPD presenting with dyspnea, admitted for CHF exacerbation.   Patient alert and active this morning. Denies shortness of breath, chest pain, back pain. Informed of her family meeting with palliative team today.   Objective:  Vital signs in last 24 hours: Vitals:   06/02/23 1916 06/03/23 0500 06/03/23 0632 06/03/23 0725  BP: 121/68  97/69 102/81  Pulse:   74 70  Resp: 18  16 18   Temp: 98.4 F (36.9 C)  98.4 F (36.9 C) 97.8 F (36.6 C)  TempSrc: Oral  Oral Oral  SpO2: 100%  90% 95%  Weight:  48.3 kg    Height:       Supplemental O2: Room Air SpO2: 95 % O2 Flow Rate (L/min): 2 L/min  Physical Exam:  Constitutional: elderly cachexic woman, in no acute distress Cardiovascular: irregular rhythm, systolic murmur Pulmonary/Chest: CTAB, no wheezes Abdominal: soft, nontender Neuro: no focal deficit noted  Assessment/Plan:   Acute heart failure with mildly reduced EF exacerbation, resolved Severe mitral regurgitation Severe tricuspid regurgitation Patient appears euvolemic and at her baseline respiratory and cardiac function. She will not be a good candidate for cardiac intervention, and her ability to have a TEE performed limits what else we can offer. With her end stage valvular disease, a goals of care discussion will likely help assist further treatment options. Stable for discharge pending results of family meeting today.  - Continue home oral Lasix  40mg  daily - Family meeting with palliative team today  Atrial fibrillation with RVR In and out of RVR. Tachycardic to 120s this morning.  - Continue home Xarelto  20mg , metoprolol  XL 75mg  daily - on telemetry  Low back pain Patient has been having intermittent lower back pain, likely musculoskeletal. Mildly tender on exam.  -  Tylenol  650mg  q6h prn, lidocaine  patch, Aquatherma - Oxycodone  5mg  once this am  Dementia with behavioral disturbance Continue home seroquel  50mg  in the am, seroquel  75mg  at bedtime, memantine  10mg  daily, melatonin 3mg  qus, buspirone  5mg  BID, citalopram  20mg  daily, Depakote  250mg  BID. PRN olanzapine  2.5mg  added - Family is working on memory care facility placement  Diet: Heart healthy VTE: Xarelto  Code: DNR  Dispo: Anticipated discharge to home   Redell Burnet, MD PGY-1 Internal Medicine Resident Pager Number [phone] Please contact the on call pager after 5 pm and on weekends at [phone].

## 2023-06-03 NOTE — TOC Progression Note (Addendum)
 Transition of Care [facility]) - Progression Note    Patient Details  Name: Marisa Nunez MRN: [id number] Date of Birth: 12-13-1945  Transition of Care [facility]) CM/SW Contact  [name], [place] Phone Number: 06/03/2023, 12:39 PM  Clinical Narrative:   [employer] informed CSW that [facility] has extended a bed offer at this time for pt. Facility will need med list prior to dc, FL2, and TB test faxed to [phone]. Per [employer], if the hold up for dc is 48 hr TB test they can also accept a negative chest xray for TB w/ MD signing off on pt not having TB. CSW notified RNCM and MD.   Edsel, w/ [employer], stated that family is working on admission paperwork today.   4:06 PM CSW faxed facility MAR and cosigned FL2 at this time. Awaiting TB results.   TOC will continue to follow.  Expected Discharge Plan: Memory Care Barriers to Discharge: Continued Medical Work up  Expected Discharge Plan and Services In-house Referral: Clinical Social Work     Living arrangements for the past 2 months: Single Family Home                   DME Agency: NA                   Social Determinants of Health (SDOH) Interventions SDOH Screenings   Food Insecurity: No Food Insecurity (05/28/2023)  Housing: Low Risk  (05/28/2023)  Transportation Needs: No Transportation Needs (05/28/2023)  Utilities: Not At Risk (05/28/2023)  Alcohol Screen: Low Risk  (01/28/2023)  Depression (PHQ2-9): Low Risk  (01/28/2023)  Financial Resource Strain: Low Risk  (01/28/2023)  Physical Activity: Sufficiently Active (01/28/2023)  Social Connections: Unknown (05/28/2023)  Stress: No Stress Concern Present (01/28/2023)  Tobacco Use: Medium Risk (05/27/2023)  Health Literacy: Adequate Health Literacy (01/28/2023)    Readmission Risk Interventions    11/28/2021   12:13 PM  Readmission Risk Prevention Plan  Transportation Screening Complete  PCP or Specialist Appt within 3-5 Days Complete  HRI or  Home Care Consult Complete  Social Work Consult for Recovery Care Planning/Counseling Complete  Palliative Care Screening Not Applicable  Medication Review Oceanographer) Complete

## 2023-06-03 NOTE — Plan of Care (Signed)

## 2023-06-03 NOTE — Progress Notes (Signed)
 Mobility Specialist Progress Note:   06/03/23 0931  Mobility  Activity Ambulated with assistance in hallway  Level of Assistance Standby assist, set-up cues, supervision of patient - no hands on  Assistive Device Front wheel walker  Distance Ambulated (ft) 200 ft  Activity Response Tolerated well  Mobility Referral Yes  Mobility visit 1 Mobility  Mobility Specialist Start Time (ACUTE ONLY) 0931  Mobility Specialist Stop Time (ACUTE ONLY) 0941  Mobility Specialist Time Calculation (min) (ACUTE ONLY) 10 min   Pt agreeable to mobility session. Required no physical assistance throughout ambulation with RW. No c/o throughout, pt back in room with all needs met, sitter present.   Therisa Rana Mobility Specialist Please contact via SecureChat or  Rehab office at [phone]

## 2023-06-03 NOTE — Progress Notes (Signed)
 Patient ID: Marisa Nunez, female   DOB: 1945-06-24, 78 y.o.   MRN: [id number]    Progress Note from the Palliative Medicine Team at [facility]   Patient Name: Marisa Nunez        Date: 06/03/2023 DOB: Sep 16, 1945  Age: 78 y.o. MRN#: [id number] Attending Physician: Forest Coy, MD Primary Care Physician: Lovie Mliss, MD Admit Date: 05/27/2023   Reason for Consultation/Follow-up   Establishing Goals of Care   HPI/ Brief Hospital Review 78 y.o. female  admitted on 05/27/2023 with advanced dementia, behavioral disturbances, atrial fibrillation on xarelto , HFrEF (LV EF 45-50% 05/09/23) PAF, breast CA, HTN, CVA, COPD with prior tobacco use, cardiomyopathy, and severe MR who presented to ED after cardiology OV (1/29) for concerns of worsening dyspnea.    Patient lives at home with daughter.   Multiple rehospitalization's and ER visits.  Most recently patient was discharged on 05/25/2023 after admission for atrial fib with RVR and altered mental status secondary to UTI.    It was recommended that she discharge to SNF but this was denied by her insurance, even after a peer to peer with our team. She was discharged home with her daughter and was seen by cardiology yesterday for follow up.    She had acute complaint of worsening dyspnea, found to be back in atrial fibrillation with RVR and volume overloaded. Felt to be acute on chronic heart failure exacerbation due to her severe valvular disease. She was directly admitted for treatment and work up for possible surgical repair of her severe MR / TR.    Patient does not have medical decision-making capacity.   Family face treatment option decisions, advanced directive decisions and anticipatory care needs.    Subjective  Extensive chart review has been completed prior to meeting with patient/family  including labs, vital signs, imaging, progress/consult notes, orders, medications and available advance directive documents.    This NP  assessed patient, she is pleasantly confused, walking the halls with a walker and a sitter.  There was a scheduled meeting today with patient's daughter for conversation regarding advance care planning and anticipatory care needs.  [name] was not in the room so I placed a telephone call to her.  She verbalized that she did not come to our scheduled meeting because there is already a discharge plan in place.   Family has a great sense of peace and relief in that care patrol help family secure a memory care unit at [facility].  Patient will discharge today.  Ongoing education to daughter regarding patient's other co-morbidities other than dementia,  specific to heart failure, severe mitral regurg, history of CVA, COPD, A-fib.  At this time there is no documented H POA or advance care planning documents.   Introduced MOST form and the importance of considering decisions regarding life prolonging measures, rehospitalizations and escalation of care for her mother anticipating that there will likely be some physical decline in the future.  Education offered on hospice benefit, and self-referral process.  Education offered today regarding  the importance of continued conversation with family and their  medical providers regarding overall plan of care and treatment options,  ensuring decisions are within the context of the patients values and GOCs.  Education offered on the importance of conversation with facility where patient is moving  to today regarding goals of care into the future.  Recommend outpatient palliative services if available  Questions and concerns addressed   Discussed with primary team and nursing staff  Time:  50  minutes  Detailed review of medical records ( labs, imaging, vital signs), medically appropriate exam ( MS, skin, cardiac,  resp)   discussed with treatment team, counseling and education to patient, family, staff, documenting clinical information,  medication management, coordination of care    Ronal Plants NP  Palliative Medicine Team Team Phone # [phone] Pager [phone]

## 2023-06-04 ENCOUNTER — Inpatient Hospital Stay (HOSPITAL_COMMUNITY): Payer: Medicare Other

## 2023-06-04 DIAGNOSIS — F03918 Unspecified dementia, unspecified severity, with other behavioral disturbance: Secondary | ICD-10-CM | POA: Diagnosis not present

## 2023-06-04 DIAGNOSIS — W1830XA Fall on same level, unspecified, initial encounter: Secondary | ICD-10-CM

## 2023-06-04 DIAGNOSIS — I4891 Unspecified atrial fibrillation: Secondary | ICD-10-CM | POA: Diagnosis not present

## 2023-06-04 DIAGNOSIS — I340 Nonrheumatic mitral (valve) insufficiency: Secondary | ICD-10-CM | POA: Diagnosis not present

## 2023-06-04 LAB — BASIC METABOLIC PANEL
Anion gap: 11 (ref 5–15)
BUN: 28 mg/dL — ABNORMAL HIGH (ref 8–23)
CO2: 30 mmol/L (ref 22–32)
Calcium: 9.1 mg/dL (ref 8.9–10.3)
Chloride: 95 mmol/L — ABNORMAL LOW (ref 98–111)
Creatinine, Ser: 0.99 mg/dL (ref 0.44–1.00)
GFR, Estimated: 59 mL/min — ABNORMAL LOW (ref >60)
Glucose, Bld: 117 mg/dL — ABNORMAL HIGH (ref 70–99)
Potassium: 3.7 mmol/L (ref 3.5–5.1)
Sodium: 136 mmol/L (ref 135–145)

## 2023-06-04 MED ORDER — METOPROLOL SUCCINATE ER 25 MG PO TB24: 75.0000 mg | ORAL_TABLET | Freq: Every day | ORAL | Status: DC

## 2023-06-04 MED ORDER — QUETIAPINE FUMARATE 50 MG PO TABS: ORAL_TABLET | ORAL | Status: AC

## 2023-06-04 MED ORDER — RIVAROXABAN 15 MG PO TABS: 15.0000 mg | ORAL_TABLET | Freq: Every day | ORAL | Status: AC

## 2023-06-04 NOTE — Discharge Instructions (Signed)
 Ms Arie, Gable were hospitalized for shortness of breath due to a heart failure exacerbation. You are breathing much easier now and I feel comfortable discharging you from the hospital. Thank you for allowing us  to be part of your care.   We arranged for you to follow up at: [facility] on Feb 12 at 8:45  *Please START taking:  Lasix  40mg  daily Metoprolol  75mg  daily Xarelto  15mg  daily   Please make sure to return to the hospital if you become short of breath, dizzy, or feel you are about to pass out.   Please call our clinic if you have any questions or concerns, we may be able to help and keep you from a long and expensive emergency room wait. Our clinic and after hours phone number is [phone], the best time to call is Monday through Friday 9 am to 4 pm but there is always someone available 24/7 if you have an emergency. If you need medication refills please notify your pharmacy one week in advance and they will send us  a request.   Thanks, Dr Francella

## 2023-06-04 NOTE — Progress Notes (Signed)
 Mobility Specialist Progress Note:   06/04/23 0915  Mobility  Activity Ambulated with assistance in hallway  Level of Assistance Standby assist, set-up cues, supervision of patient - no hands on  Assistive Device Front wheel walker  Distance Ambulated (ft) 200 ft  Activity Response Tolerated well  Mobility Referral Yes  Mobility visit 1 Mobility  Mobility Specialist Start Time (ACUTE ONLY) 0915  Mobility Specialist Stop Time (ACUTE ONLY) 0930  Mobility Specialist Time Calculation (min) (ACUTE ONLY) 15 min   Pt agreeable to mobility session. Required no physical assistance, only supervision for safety. Pt had a fall last night, does not recall. Denies pain throughout. Pt back in room with all needs met, sitter present.  Therisa Rana Mobility Specialist Please contact via SecureChat or  Rehab office at [phone]

## 2023-06-04 NOTE — Progress Notes (Addendum)
                  Subjective:   Summary: Patient is a 78 year old woman with dementia with behavioral disturbances, atrial fibrillation on Xarelto , HFrEF w/EF 45-50%, severe mitral regurgitation, CVA, COPD presenting with dyspnea, admitted for CHF exacerbation.   Patient fell while walking last night, see overnight team progress note.   Patient alert and active this morning. She denies any pain in her head, neck or back. She is unconcerned about the fall, saying she is klutzy.   Objective:  Vital signs in last 24 hours: Vitals:   06/03/23 1233 06/03/23 1238 06/03/23 2300 06/03/23 2305  BP: 105/83  110/73   Pulse:      Resp: 18  18 18   Temp: 98.3 F (36.8 C)  98.3 F (36.8 C) 98.3 F (36.8 C)  TempSrc: Axillary  Oral Oral  SpO2: (!) 84% 100% 90%   Weight:      Height:       Supplemental O2: Room Air SpO2: 90 % O2 Flow Rate (L/min): 2 L/min  Physical Exam:  Constitutional: elderly cachexic woman, in no acute distress Cardiovascular: irregular rhythm, systolic murmur Pulmonary/Chest: CTAB, no wheezes Abdominal: soft, nontender Neuro: no focal deficit noted  Assessment/Plan:   Acute heart failure with mildly reduced EF exacerbation, resolved Severe mitral regurgitation Severe tricuspid regurgitation Patient appears euvolemic and at her baseline respiratory and cardiac function. She will not be a good candidate for cardiac intervention, and her ability to have a TEE performed limits what else we can offer. Stable for discharge. She has been accepted at a memory care facility, however they need confirmation that she does not have TB.  - Continue home oral Lasix  40mg  daily - Dispo pending Quantiferon gold results or if memory care facility will accept negative CXR as proof of no TB  Atrial fibrillation with RVR In and out of RVR. Tachycardic to 120s this morning.  - Continue home Xarelto  20mg , metoprolol  XL 75mg  daily - on telemetry  Low back pain Patient has been  having intermittent lower back pain, likely musculoskeletal.  - Tylenol  650mg  q6h prn, lidocaine  patch, Aquatherma  Ground level fall Patient fell while walking with nurse yesterday, hitting her head. She is not having head, neck, or back pain, physical exam is unremarkable. CT head was negative for any acute intracranial abnormality. No further intervention needed.   Dementia with behavioral disturbance Continue home seroquel  50mg  in the am, seroquel  75mg  at bedtime, memantine  10mg  daily, melatonin 3mg  qus, buspirone  5mg  BID, citalopram  20mg  daily, Depakote  250mg  BID. PRN olanzapine  2.5mg  added - Family is working on memory care facility placement  Diet: Heart healthy VTE: Xarelto  Code: DNR  Dispo: Anticipated discharge to home   Redell Burnet, MD PGY-1 Internal Medicine Resident Pager Number [phone] Please contact the on call pager after 5 pm and on weekends at [phone].

## 2023-06-04 NOTE — TOC Progression Note (Addendum)
 Transition of Care [facility]) - Progression Note    Patient Details  Name: Marisa Nunez MRN: [id number] Date of Birth: 1946/03/31  [facility]) CM/SW Contact  [name], [place] Phone Number: 06/04/2023, 8:30 AM  Clinical Narrative:   CSW faxed CXR to [facility]. Will follow up w/ MD if facility can accept CXR and pt can dc today.  11:00 AM Danielle w/ [employer] contacted CSW to inform me that pt can dc today to [facility]. CSW notified MD.   Expected Discharge Plan: Memory Care Barriers to Discharge: Continued Medical Work up  Expected Discharge Plan and Services In-house Referral: Clinical Social Work     Living arrangements for the past 2 months: Single Family Home                   DME Agency: NA                   Social Determinants of Health (SDOH) Interventions SDOH Screenings   Food Insecurity: No Food Insecurity (05/28/2023)  Housing: Low Risk  (05/28/2023)  Transportation Needs: No Transportation Needs (05/28/2023)  Utilities: Not At Risk (05/28/2023)  Alcohol Screen: Low Risk  (01/28/2023)  Depression (PHQ2-9): Low Risk  (01/28/2023)  Financial Resource Strain: Low Risk  (01/28/2023)  Physical Activity: Sufficiently Active (01/28/2023)  Social Connections: Unknown (05/28/2023)  Stress: No Stress Concern Present (01/28/2023)  Tobacco Use: Medium Risk (05/27/2023)  Health Literacy: Adequate Health Literacy (01/28/2023)    Readmission Risk Interventions    11/28/2021   12:13 PM  Readmission Risk Prevention Plan  Transportation Screening Complete  PCP or Specialist Appt within 3-5 Days Complete  HRI or Home Care Consult Complete  Social Work Consult for Recovery Care Planning/Counseling Complete  Palliative Care Screening Not Applicable  Medication Review Oceanographer) Complete

## 2023-06-04 NOTE — Progress Notes (Signed)
   06/03/23 2300  What Happened  Was fall witnessed? Yes  Who witnessed fall? Odyn Turko RN  Patients activity before fall ambulating-unassisted  Point of contact buttocks;head  Was patient injured? Unsure (patient refusing assessment)  Provider Notification  Provider Name/Title Tawkaliyar  Date Provider Notified 06/03/23  Time Provider Notified 2302  Method of Notification Page  Notification Reason Fall  Provider response At bedside  Date of Provider Response 06/03/23  Time of Provider Response 2305  Follow Up  Family notified Yes - comment [place], daughter on file)  Time family notified 0002  Additional tests Yes-comment (CT - not yet complete)  Progress note created (see row info) Yes  Blank note created Yes  Adult Fall Risk Assessment  Risk Factor Category (scoring not indicated) Not Applicable  Age 78  Fall History: Fall within 6 months prior to admission 0  Elimination; Bowel and/or Urine Incontinence 0  Elimination; Bowel and/or Urine Urgency/Frequency 0  Medications: includes PCA/Opiates, Anti-convulsants, Anti-hypertensives, Diuretics, Hypnotics, Laxatives, Sedatives, and Psychotropics 5  Patient Care Equipment 0  Mobility-Assistance 2  Mobility-Gait 2  Mobility-Sensory Deficit 0  Altered awareness of immediate physical environment 1  Impulsiveness 2  Lack of understanding of one's physical/cognitive limitations 4  Total Score 18  Patient Fall Risk Level High fall risk  Adult Fall Risk Interventions  Required Bundle Interventions *See Row Information* High fall risk - low, moderate, and high requirements implemented  Additional Interventions Use of appropriate toileting equipment (bedpan, BSC, etc.)  Screening for Fall Injury Risk (To be completed on HIGH fall risk patients) - Assessing Need for Floor Mats  Risk For Fall Injury- Criteria for Floor Mats Confusion/dementia (+NuDESC, CIWA, TBI, etc.)  Will Implement Floor Mats Yes  Vitals  Temp 98.3 F (36.8 C)   Temp Source Oral  BP 110/73  MAP (mmHg) 83  BP Location Left Arm  BP Method Automatic  Patient Position (if appropriate) Sitting  Pulse Rate Source Monitor  Resp 18  Oxygen  Therapy  SpO2 90 %  O2 Device Room Air  Pain Assessment  Pain Scale PAINAD  PAINAD (Pain Assessment in Advanced Dementia)  Breathing 1  Negative Vocalization 0  Facial Expression 0  Body Language 0  Consolability 0  PAINAD Score 1  Neurological  Neuro (WDL) X  Level of Consciousness Alert  Orientation Level Oriented to person;Disoriented to place;Disoriented to time;Oriented to situation  Cognition Poor attention/concentration;Impulsive;Poor safety awareness  Speech Clear  Neuro Symptoms Wandering;Combative;Agitation;Anxiety  Glasgow Coma Scale  Eye Opening 4  Best Verbal Response (NON-intubated) 5  Best Motor Response 6  Glasgow Coma Scale Score 15  Musculoskeletal  Musculoskeletal (WDL) X  Assistive Device Front wheel walker  Generalized Weakness Yes  Weight Bearing Restrictions Per Provider Order No  Integumentary  Integumentary (WDL) WDL (limited assessment - patient refused full)  Skin Color Appropriate for ethnicity  Skin Condition Dry  Skin Integrity Intact  Skin Turgor Non-tenting

## 2023-06-04 NOTE — TOC Transition Note (Signed)
 Transition of Care [facility]) - Discharge Note   Patient Details  Name: Marisa Nunez MRN: [id number] Date of Birth: 24-May-1945  [facility] CM/SW Contact:  [name], LCSWA Phone Number: 06/04/2023, 11:21 AM   Clinical Narrative:   Patient will DC to: [facility] Anticipated DC date: 06/04/2023 Family notified: [name] (dtr) Transport by: [place]    Per MD patient ready for DC to [facility]. RN to call report prior to discharge [phone]). RN, patient, patient's family, and facility notified of DC. Discharge Summary and FL2 sent to facility. DC packet on chart. Ambulance transport requested for patient.   CSW will sign off for now as social work intervention is no longer needed. Please consult us  again if new needs arise.      Final next level of care: Other (comment) ([facility]) Barriers to Discharge: Barriers Resolved   Patient Goals and CMS Choice Patient states their goals for this hospitalization and ongoing recovery are:: Unable to assess          Discharge Placement              Patient chooses bed at: Other - please specify in the comment section below: [facility]) Patient to be transferred to facility by: Ptar Name of family member notified: Dtr GLENWOOD [name] Patient and family notified of of transfer: 06/04/23  Discharge Plan and Services Additional resources added to the After Visit Summary for   In-house Referral: Clinical Social Work                DME Agency: NA                  Social Drivers of Health (SDOH) Interventions SDOH Screenings   Food Insecurity: No Food Insecurity (05/28/2023)  Housing: Low Risk  (05/28/2023)  Transportation Needs: No Transportation Needs (05/28/2023)  Utilities: Not At Risk (05/28/2023)  Alcohol Screen: Low Risk  (01/28/2023)  Depression (PHQ2-9): Low Risk  (01/28/2023)  Financial Resource Strain: Low Risk  (01/28/2023)  Physical Activity: Sufficiently Active  (01/28/2023)  Social Connections: Unknown (05/28/2023)  Stress: No Stress Concern Present (01/28/2023)  Tobacco Use: Medium Risk (05/27/2023)  Health Literacy: Adequate Health Literacy (01/28/2023)     Readmission Risk Interventions    11/28/2021   12:13 PM  Readmission Risk Prevention Plan  Transportation Screening Complete  PCP or Specialist Appt within 3-5 Days Complete  HRI or Home Care Consult Complete  Social Work Consult for Recovery Care Planning/Counseling Complete  Palliative Care Screening Not Applicable  Medication Review Oceanographer) Complete

## 2023-06-04 NOTE — Progress Notes (Addendum)
 Call placed to [facility] to give patient report, receiving nurse not available to get report. Left call back number for nurse Taisha to call me and get report. Verdel LOISE Shams, RN  1200: Patient report given to nurse Tiffany at [facility]. Verdel LOISE Shams, RN

## 2023-06-05 ENCOUNTER — Other Ambulatory Visit: Payer: Self-pay

## 2023-06-05 MED ORDER — METOPROLOL SUCCINATE ER 25 MG PO TB24: 75.0000 mg | ORAL_TABLET | Freq: Every day | ORAL | 3 refills | Status: AC

## 2023-06-06 LAB — QUANTIFERON-TB GOLD PLUS: QuantiFERON-TB Gold Plus: NEGATIVE

## 2023-06-06 LAB — QUANTIFERON-TB GOLD PLUS (RQFGPL)
QuantiFERON Mitogen Value: 0.81 [IU]/mL
QuantiFERON Nil Value: 0.01 [IU]/mL
QuantiFERON TB1 Ag Value: 0.01 [IU]/mL
QuantiFERON TB2 Ag Value: 0.01 [IU]/mL

## 2023-06-08 DIAGNOSIS — M6281 Muscle weakness (generalized): Secondary | ICD-10-CM | POA: Diagnosis not present

## 2023-06-08 DIAGNOSIS — R278 Other lack of coordination: Secondary | ICD-10-CM | POA: Diagnosis not present

## 2023-06-09 DIAGNOSIS — M6281 Muscle weakness (generalized): Secondary | ICD-10-CM | POA: Diagnosis not present

## 2023-06-09 DIAGNOSIS — M79675 Pain in left toe(s): Secondary | ICD-10-CM | POA: Diagnosis not present

## 2023-06-09 DIAGNOSIS — B351 Tinea unguium: Secondary | ICD-10-CM | POA: Diagnosis not present

## 2023-06-09 DIAGNOSIS — M2042 Other hammer toe(s) (acquired), left foot: Secondary | ICD-10-CM | POA: Diagnosis not present

## 2023-06-10 ENCOUNTER — Encounter: Payer: Medicare Other | Admitting: Student

## 2023-06-10 ENCOUNTER — Encounter: Payer: Medicare Other | Admitting: Internal Medicine

## 2023-06-16 DIAGNOSIS — R1312 Dysphagia, oropharyngeal phase: Secondary | ICD-10-CM | POA: Diagnosis not present

## 2023-06-17 NOTE — Progress Notes (Deleted)
 Cardiology Office Note    Date:  06/17/2023  ID:  Cheree, Fowles Jan 13, 1946, MRN [id number] PCP:  Mercie Eon, MD  Cardiologist:  Parke Poisson, MD  Electrophysiologist:  None   Chief Complaint: ***  History of Present Illness: .    COURTNE LIGHTY is a 78 y.o. 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  Ms. Turrubiates was previously followed by Dr. Debroah Loop in [place], [place] primarily for paroxysmal atrial fibrillation.  In 2022 she moved to [place].  Echo in 07/16/2018 and 11/14/2020 indicated LVEF of 55 to 60% and grade 1 diastolic dysfunction.  In 11/2021 she was admitted for acute CHF and atrial fibrillation with RVR after presenting with shortness of breath and lower extremity edema.  Echo indicated LVEF of 40 to 45% with global hypokinesis, mildly reduced RV systolic function and moderate MR.  She underwent diuresis with IV Lasix.  Her home Toprol was increased for better rate control and she was continued on home losartan, not started on SGLT2 inhibitor due to frequent UTIs.  Patient had cardiac PET ordered due to drop in EF although does not appear this was ever completed.  In 01/2022 she presented to the A-fib clinic with persistent atrial fibrillation, she had previously been loaded with amiodarone.  On 02/19/2022 she underwent successful cardioversion, cardioverted to sinus bradycardia with PACs after 1 synchronized biphasic 150 J shock.  Unfortunately on follow-up she had a return to A-fib.  Her neurologist wanted to start her on Seroquel for her dementia, amiodarone was discontinued, it was determined that rate control was best option moving forward.  Unfortunately she was then lost to follow-up.  In January 2025 patient presented to the ED and was admitted by teaching service for worsening  shortness of breath, found to have A-fib with RVR and instantly have UTI, hyponatremia.  She was treated with antibiotics and diuretics.  Echo performed indicated EF at 45 to 50%, moderate to severe MR and severe TR which had worsened from prior echo imaging in 11/2021.  Patient was discharged on 05/25/2023.  On 05/27/2023 patient's daughter notified the office requesting an office visit for concern regarding patient's breathing.  She was seen in office and was noted to be tripoding, had symptoms of orthopnea but no overt fluid volume overload.  It was recommended that patient present to the emergency room given need for likely IV diuretics with plan for TEE and right/left heart catheterization for territory CAD and MR evaluation.  In the ER BNP was elevated at 1131, chest x-ray with no acute findings.  She was started on IV Lasix and diuresed.  Plan was for TEE however on further discussion it was felt that due to her dementia and comorbidities that even if it was determined she had severe MR with need for procedure she was not a good candidate.  Patient was also combative and agitated and refused procedure, although noted she did not likely have capacity for this, daughter is POA.  It was determined that patient would continue with medication management and she was discharged to SNF.  Today she presents for follow-up with her daughter.  Given patient's dementia her daughter assist with majority of her history.  They report today that she is    Chronic combined CHF with mildly reduced EF/Severe Mitral regurgitation/Severe Tricuspid regurgitation: Recently readmitted on 05/27/2023  for acute heart failure exacerbation, BNP 1131, chest x-ray with no acute findings.  She underwent IV diuresis.  Initially plan was for TEE however it was felt that due to her dementia and comorbidities that even if it was determined she had severe MR with need for procedure she was not a candidate.  Patient was noted to be  combative/agitated and refused procedure, palliative care consulted, agreed with continuing with medical management.  Today she  Continue  Reviewed ED precautions.   Persistent atrial fibrillation: Patient previously failed DCCV in 2023 while on amiodarone.  Goal is rate control at this time.  She is currently on Xarelto 15 mg daily.  Continue Toprol XL 75 mg daily.  Hypertension: Blood pressure today Continue  Dementia:  Labwork independently reviewed: 06/04/2023: Sodium 136, potassium 3.7, creatinine 0.99  ROS: .   *** denies chest pain, shortness of breath, lower extremity edema, fatigue, palpitations, melena, hematuria, hemoptysis, diaphoresis, weakness, presyncope, syncope, orthopnea, and PND.  All other systems are reviewed and otherwise negative.  Studies Reviewed: Marland Kitchen    EKG:  EKG is ordered today, personally reviewed, demonstrating ***  CV Studies:  Cardiac Studies & Procedures   ______________________________________________________________________________________________     ECHOCARDIOGRAM  ECHOCARDIOGRAM COMPLETE 05/09/2023  Narrative ECHOCARDIOGRAM REPORT    Patient Name:   FELMA PFEFFERLE Date of Exam: 05/09/2023 Medical Rec #:  [id number]       Height:       64.0 in Accession #:    [id number]      Weight:       115.0 lb Date of Birth:  1945/08/01       BSA:          1.546 m Patient Age:    77 years        BP:           151/139 mmHg Patient Gender: F               HR:           81 bpm. Exam Location:  Inpatient  Procedure: 2D Echo, Cardiac Doppler and Color Doppler  Indications:    Congestive heart failure  History:        Patient has prior history of Echocardiogram examinations, most recent 11/27/2021. CHF; Signs/Symptoms:Alzheimer's. CVA.  Sonographer:    Amy Chionchio Referring Phys: [id number] GRACE LAU  IMPRESSIONS   1. Left ventricular ejection fraction, by estimation, is 45 to 50%. The left ventricle has mildly decreased function. Left ventricular  diastolic parameters are indeterminate. 2. Right ventricular systolic function is normal. The right ventricular size is normal. 3. Left atrial size was moderately dilated. 4. Right atrial size was moderately dilated. 5. Moderate to severe mitral valve regurgitation. 6. Tricuspid valve regurgitation is severe. 7. The aortic valve is tricuspid. Aortic valve regurgitation is not visualized. 8. The inferior vena cava is normal in size with greater than 50% respiratory variability, suggesting right atrial pressure of 3 mmHg.  FINDINGS Left Ventricle: Left ventricular ejection fraction, by estimation, is 45 to 50%. The left ventricle has mildly decreased function. The left ventricular internal cavity size was normal in size. There is no left ventricular hypertrophy. Left ventricular diastolic parameters are indeterminate.  Right Ventricle: The right ventricular size is normal. Right vetricular wall thickness was not assessed. Right ventricular systolic function is normal.  Left Atrium: Left atrial size was moderately dilated.  Right Atrium: Right atrial size was moderately dilated.  Pericardium: There is no evidence of pericardial  effusion.  Mitral Valve: There is mild thickening of the mitral valve leaflet(s). Moderate to severe mitral valve regurgitation. MV peak gradient, 10.1 mmHg. The mean mitral valve gradient is 3.0 mmHg.  Tricuspid Valve: The tricuspid valve is normal in structure. Tricuspid valve regurgitation is severe.  Aortic Valve: The aortic valve is tricuspid. Aortic valve regurgitation is not visualized. Aortic valve mean gradient measures 10.7 mmHg. Aortic valve peak gradient measures 18.3 mmHg. Aortic valve area, by VTI measures 0.95 cm.  Pulmonic Valve: The pulmonic valve was not well visualized. Pulmonic valve regurgitation is not visualized. No evidence of pulmonic stenosis.  Aorta: The aortic root and ascending aorta are structurally normal, with no evidence of  dilitation.  Venous: The inferior vena cava is normal in size with greater than 50% respiratory variability, suggesting right atrial pressure of 3 mmHg.  IAS/Shunts: No atrial level shunt detected by color flow Doppler.   LEFT VENTRICLE PLAX 2D LVIDd:         4.60 cm LVIDs:         4.00 cm LV PW:         1.10 cm LV IVS:        1.00 cm LVOT diam:     1.90 cm LV SV:         37 LV SV Index:   24 LVOT Area:     2.84 cm  LV Volumes (MOD) LV vol d, MOD A2C: 114.0 ml LV vol d, MOD A4C: 105.0 ml LV vol s, MOD A2C: 57.4 ml LV vol s, MOD A4C: 58.7 ml LV SV MOD A2C:     56.6 ml LV SV MOD A4C:     105.0 ml LV SV MOD BP:      57.4 ml  RIGHT VENTRICLE          IVC RV Basal diam:  4.00 cm  IVC diam: 2.40 cm RV Mid diam:    2.40 cm TAPSE (M-mode): 1.4 cm  LEFT ATRIUM             Index        RIGHT ATRIUM           Index LA Vol (A2C):   73.2 ml 47.34 ml/m  RA Area:     19.30 cm LA Vol (A4C):   65.6 ml 42.42 ml/m  RA Volume:   62.00 ml  40.09 ml/m LA Biplane Vol: 71.6 ml 46.30 ml/m AORTIC VALVE                     PULMONIC VALVE AV Area (Vmax):    1.01 cm      PV Vmax:       0.87 m/s AV Area (Vmean):   0.95 cm      PV Peak grad:  3.0 mmHg AV Area (VTI):     0.95 cm AV Vmax:           213.67 cm/s AV Vmean:          152.333 cm/s AV VTI:            0.386 m AV Peak Grad:      18.3 mmHg AV Mean Grad:      10.7 mmHg LVOT Vmax:         75.93 cm/s LVOT Vmean:        50.967 cm/s LVOT VTI:          0.129 m LVOT/AV VTI ratio: 0.34  AORTA Ao Root diam: 2.50  cm Ao Asc diam:  2.90 cm  MITRAL VALVE                  TRICUSPID VALVE MV Area (PHT): 3.79 cm       TR Peak grad:   31.4 mmHg MV Area VTI:   1.12 cm       TR Vmax:        280.00 cm/s MV Peak grad:  10.1 mmHg MV Mean grad:  3.0 mmHg       SHUNTS MV Vmax:       1.59 m/s       Systemic VTI:  0.13 m MV Vmean:      72.2 cm/s      Systemic Diam: 1.90 cm MR Peak grad:    88.7 mmHg MR Mean grad:    59.0 mmHg MR Vmax:          471.00 cm/s MR Vmean:        362.0 cm/s MR PISA:         1.57 cm MR PISA Eff ROA: 13 mm MR PISA Radius:  0.50 cm  Dietrich Pates MD Electronically signed by Dietrich Pates MD Signature Date/Time: 05/09/2023/4:24:23 PM    Final          ______________________________________________________________________________________________        Current Reported Medications:.    No outpatient medications have been marked as taking for the 06/18/23 encounter (Appointment) with Rip Harbour, NP.    Physical Exam:    VS:  LMP  (LMP Unknown)    Wt Readings from Last 3 Encounters:  06/03/23 106 lb 7.7 oz (48.3 kg)  05/27/23 114 lb (51.7 kg)  05/06/23 115 lb (52.2 kg)    GEN: Well nourished, well developed in no acute distress NECK: No JVD; No carotid bruits CARDIAC: ***RRR, no murmurs, rubs, gallops RESPIRATORY:  Clear to auscultation without rales, wheezing or rhonchi  ABDOMEN: Soft, non-tender, non-distended EXTREMITIES:  No edema; No acute deformity   Asessement and Plan:.     ***     Disposition: F/u with ***  Signed, Rip Harbour, NP

## 2023-06-18 ENCOUNTER — Ambulatory Visit: Payer: Medicare Other | Attending: Cardiology | Admitting: Cardiology

## 2023-06-18 DIAGNOSIS — I071 Rheumatic tricuspid insufficiency: Secondary | ICD-10-CM

## 2023-06-18 DIAGNOSIS — M6281 Muscle weakness (generalized): Secondary | ICD-10-CM | POA: Diagnosis not present

## 2023-06-18 DIAGNOSIS — I10 Essential (primary) hypertension: Secondary | ICD-10-CM

## 2023-06-18 DIAGNOSIS — D6869 Other thrombophilia: Secondary | ICD-10-CM

## 2023-06-18 DIAGNOSIS — R0602 Shortness of breath: Secondary | ICD-10-CM

## 2023-06-18 DIAGNOSIS — I5042 Chronic combined systolic (congestive) and diastolic (congestive) heart failure: Secondary | ICD-10-CM

## 2023-06-18 DIAGNOSIS — I4819 Other persistent atrial fibrillation: Secondary | ICD-10-CM

## 2023-06-18 DIAGNOSIS — I340 Nonrheumatic mitral (valve) insufficiency: Secondary | ICD-10-CM

## 2023-06-19 DIAGNOSIS — M6281 Muscle weakness (generalized): Secondary | ICD-10-CM | POA: Diagnosis not present

## 2023-06-19 DIAGNOSIS — R278 Other lack of coordination: Secondary | ICD-10-CM | POA: Diagnosis not present

## 2023-06-19 DIAGNOSIS — I69898 Other sequelae of other cerebrovascular disease: Secondary | ICD-10-CM | POA: Diagnosis not present

## 2023-06-19 DIAGNOSIS — G309 Alzheimer's disease, unspecified: Secondary | ICD-10-CM | POA: Diagnosis not present

## 2023-06-23 DIAGNOSIS — R278 Other lack of coordination: Secondary | ICD-10-CM | POA: Diagnosis not present

## 2023-06-23 DIAGNOSIS — M6281 Muscle weakness (generalized): Secondary | ICD-10-CM | POA: Diagnosis not present

## 2023-06-23 DIAGNOSIS — Z79899 Other long term (current) drug therapy: Secondary | ICD-10-CM | POA: Diagnosis not present

## 2023-06-23 DIAGNOSIS — R1312 Dysphagia, oropharyngeal phase: Secondary | ICD-10-CM | POA: Diagnosis not present

## 2023-06-25 DIAGNOSIS — R278 Other lack of coordination: Secondary | ICD-10-CM | POA: Diagnosis not present

## 2023-06-25 DIAGNOSIS — R1312 Dysphagia, oropharyngeal phase: Secondary | ICD-10-CM | POA: Diagnosis not present

## 2023-06-25 DIAGNOSIS — M6281 Muscle weakness (generalized): Secondary | ICD-10-CM | POA: Diagnosis not present

## 2023-06-29 DIAGNOSIS — M6281 Muscle weakness (generalized): Secondary | ICD-10-CM | POA: Diagnosis not present

## 2023-06-30 DIAGNOSIS — R1312 Dysphagia, oropharyngeal phase: Secondary | ICD-10-CM | POA: Diagnosis not present

## 2023-07-01 DIAGNOSIS — R278 Other lack of coordination: Secondary | ICD-10-CM | POA: Diagnosis not present

## 2023-07-01 DIAGNOSIS — M6281 Muscle weakness (generalized): Secondary | ICD-10-CM | POA: Diagnosis not present

## 2023-07-02 DIAGNOSIS — R278 Other lack of coordination: Secondary | ICD-10-CM | POA: Diagnosis not present

## 2023-07-02 DIAGNOSIS — R1312 Dysphagia, oropharyngeal phase: Secondary | ICD-10-CM | POA: Diagnosis not present

## 2023-07-02 DIAGNOSIS — M6281 Muscle weakness (generalized): Secondary | ICD-10-CM | POA: Diagnosis not present

## 2023-07-07 DIAGNOSIS — M6281 Muscle weakness (generalized): Secondary | ICD-10-CM | POA: Diagnosis not present

## 2023-07-07 DIAGNOSIS — R1312 Dysphagia, oropharyngeal phase: Secondary | ICD-10-CM | POA: Diagnosis not present

## 2023-07-08 DIAGNOSIS — M6281 Muscle weakness (generalized): Secondary | ICD-10-CM | POA: Diagnosis not present

## 2023-07-08 DIAGNOSIS — R278 Other lack of coordination: Secondary | ICD-10-CM | POA: Diagnosis not present

## 2023-07-09 DIAGNOSIS — M6281 Muscle weakness (generalized): Secondary | ICD-10-CM | POA: Diagnosis not present

## 2023-07-09 DIAGNOSIS — R1312 Dysphagia, oropharyngeal phase: Secondary | ICD-10-CM | POA: Diagnosis not present

## 2023-07-09 DIAGNOSIS — R278 Other lack of coordination: Secondary | ICD-10-CM | POA: Diagnosis not present

## 2023-07-13 DIAGNOSIS — M6281 Muscle weakness (generalized): Secondary | ICD-10-CM | POA: Diagnosis not present

## 2023-07-14 DIAGNOSIS — R278 Other lack of coordination: Secondary | ICD-10-CM | POA: Diagnosis not present

## 2023-07-14 DIAGNOSIS — R1312 Dysphagia, oropharyngeal phase: Secondary | ICD-10-CM | POA: Diagnosis not present

## 2023-07-14 DIAGNOSIS — M6281 Muscle weakness (generalized): Secondary | ICD-10-CM | POA: Diagnosis not present

## 2023-07-16 DIAGNOSIS — R1312 Dysphagia, oropharyngeal phase: Secondary | ICD-10-CM | POA: Diagnosis not present

## 2023-07-16 DIAGNOSIS — R278 Other lack of coordination: Secondary | ICD-10-CM | POA: Diagnosis not present

## 2023-07-16 DIAGNOSIS — M6281 Muscle weakness (generalized): Secondary | ICD-10-CM | POA: Diagnosis not present

## 2023-07-21 DIAGNOSIS — M6281 Muscle weakness (generalized): Secondary | ICD-10-CM | POA: Diagnosis not present

## 2023-07-21 DIAGNOSIS — R1312 Dysphagia, oropharyngeal phase: Secondary | ICD-10-CM | POA: Diagnosis not present

## 2023-07-22 DIAGNOSIS — R278 Other lack of coordination: Secondary | ICD-10-CM | POA: Diagnosis not present

## 2023-07-22 DIAGNOSIS — M6281 Muscle weakness (generalized): Secondary | ICD-10-CM | POA: Diagnosis not present

## 2023-07-23 DIAGNOSIS — R1312 Dysphagia, oropharyngeal phase: Secondary | ICD-10-CM | POA: Diagnosis not present

## 2023-07-23 DIAGNOSIS — M6281 Muscle weakness (generalized): Secondary | ICD-10-CM | POA: Diagnosis not present

## 2023-07-28 DIAGNOSIS — R1312 Dysphagia, oropharyngeal phase: Secondary | ICD-10-CM | POA: Diagnosis not present

## 2023-07-28 DIAGNOSIS — M6281 Muscle weakness (generalized): Secondary | ICD-10-CM | POA: Diagnosis not present

## 2023-07-29 DIAGNOSIS — M6281 Muscle weakness (generalized): Secondary | ICD-10-CM | POA: Diagnosis not present

## 2023-07-29 DIAGNOSIS — R278 Other lack of coordination: Secondary | ICD-10-CM | POA: Diagnosis not present

## 2023-07-30 DIAGNOSIS — M6281 Muscle weakness (generalized): Secondary | ICD-10-CM | POA: Diagnosis not present

## 2023-07-30 DIAGNOSIS — R1312 Dysphagia, oropharyngeal phase: Secondary | ICD-10-CM | POA: Diagnosis not present

## 2023-08-04 DIAGNOSIS — M6281 Muscle weakness (generalized): Secondary | ICD-10-CM | POA: Diagnosis not present

## 2023-08-04 DIAGNOSIS — R1312 Dysphagia, oropharyngeal phase: Secondary | ICD-10-CM | POA: Diagnosis not present

## 2023-08-05 ENCOUNTER — Other Ambulatory Visit: Payer: Self-pay

## 2023-08-05 DIAGNOSIS — I69898 Other sequelae of other cerebrovascular disease: Secondary | ICD-10-CM | POA: Diagnosis not present

## 2023-08-05 NOTE — Telephone Encounter (Signed)
 Per patients daughter [name] stated the patient is in a facility.

## 2023-08-06 DIAGNOSIS — M6281 Muscle weakness (generalized): Secondary | ICD-10-CM | POA: Diagnosis not present

## 2023-08-06 DIAGNOSIS — R1312 Dysphagia, oropharyngeal phase: Secondary | ICD-10-CM | POA: Diagnosis not present

## 2023-08-06 MED ORDER — CITALOPRAM HYDROBROMIDE 20 MG PO TABS: 20.0000 mg | ORAL_TABLET | Freq: Every day | ORAL | Status: AC

## 2023-08-11 DIAGNOSIS — R1312 Dysphagia, oropharyngeal phase: Secondary | ICD-10-CM | POA: Diagnosis not present

## 2023-08-11 DIAGNOSIS — M6281 Muscle weakness (generalized): Secondary | ICD-10-CM | POA: Diagnosis not present

## 2023-08-13 DIAGNOSIS — M6281 Muscle weakness (generalized): Secondary | ICD-10-CM | POA: Diagnosis not present

## 2023-09-28 DIAGNOSIS — M79675 Pain in left toe(s): Secondary | ICD-10-CM | POA: Diagnosis not present

## 2023-09-28 DIAGNOSIS — B351 Tinea unguium: Secondary | ICD-10-CM | POA: Diagnosis not present

## 2023-10-28 DIAGNOSIS — I69898 Other sequelae of other cerebrovascular disease: Secondary | ICD-10-CM | POA: Diagnosis not present

## 2023-11-25 DIAGNOSIS — I69898 Other sequelae of other cerebrovascular disease: Secondary | ICD-10-CM | POA: Diagnosis not present

## 2023-12-09 DIAGNOSIS — R0602 Shortness of breath: Secondary | ICD-10-CM | POA: Diagnosis not present

## 2023-12-23 DIAGNOSIS — I69898 Other sequelae of other cerebrovascular disease: Secondary | ICD-10-CM | POA: Diagnosis not present

## 2024-01-20 DIAGNOSIS — I69898 Other sequelae of other cerebrovascular disease: Secondary | ICD-10-CM | POA: Diagnosis not present

## 2024-02-17 DIAGNOSIS — I69898 Other sequelae of other cerebrovascular disease: Secondary | ICD-10-CM | POA: Diagnosis not present

## 2024-05-02 IMAGING — CR DG SHOULDER 2+V*R*
3 series · 3 of 3 positions shown · non-contrast
Comparison: Two days ago

CLINICAL DATA: Fall from bed.

EXAM:
RIGHT SHOULDER - 2+ VIEW

[shoulder grashey]
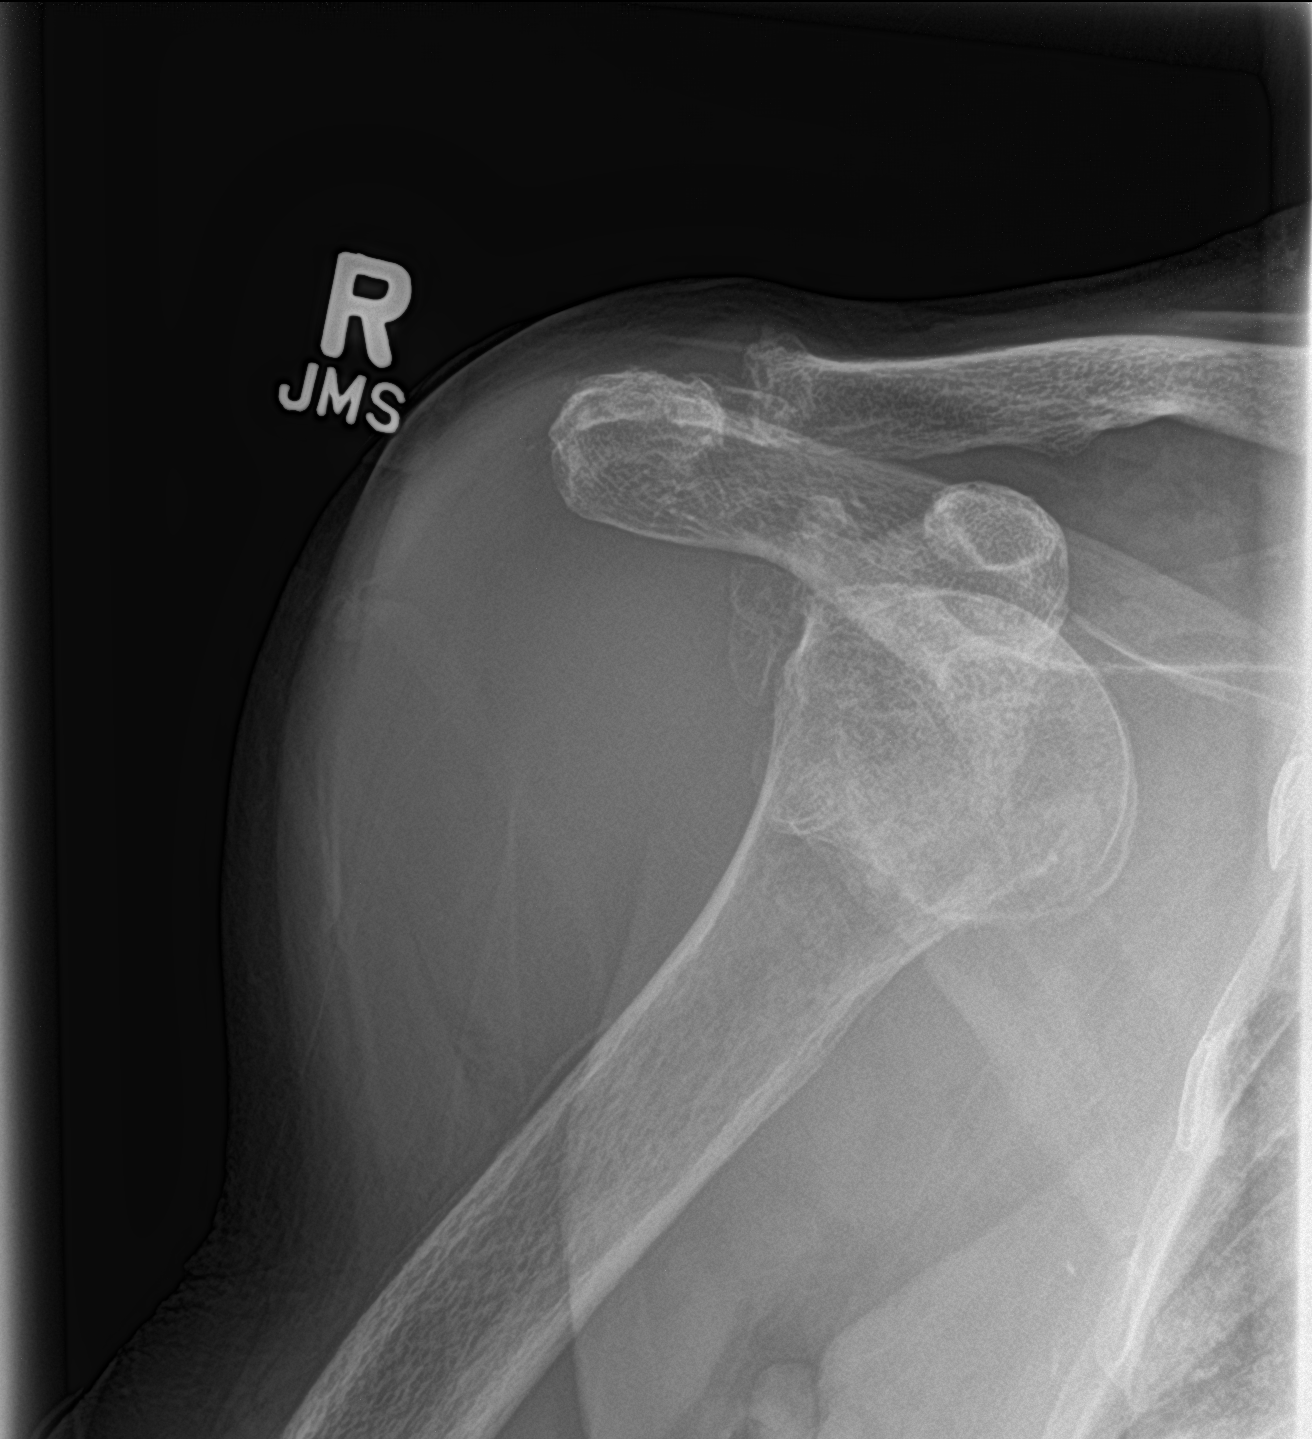

[shoulder y view]
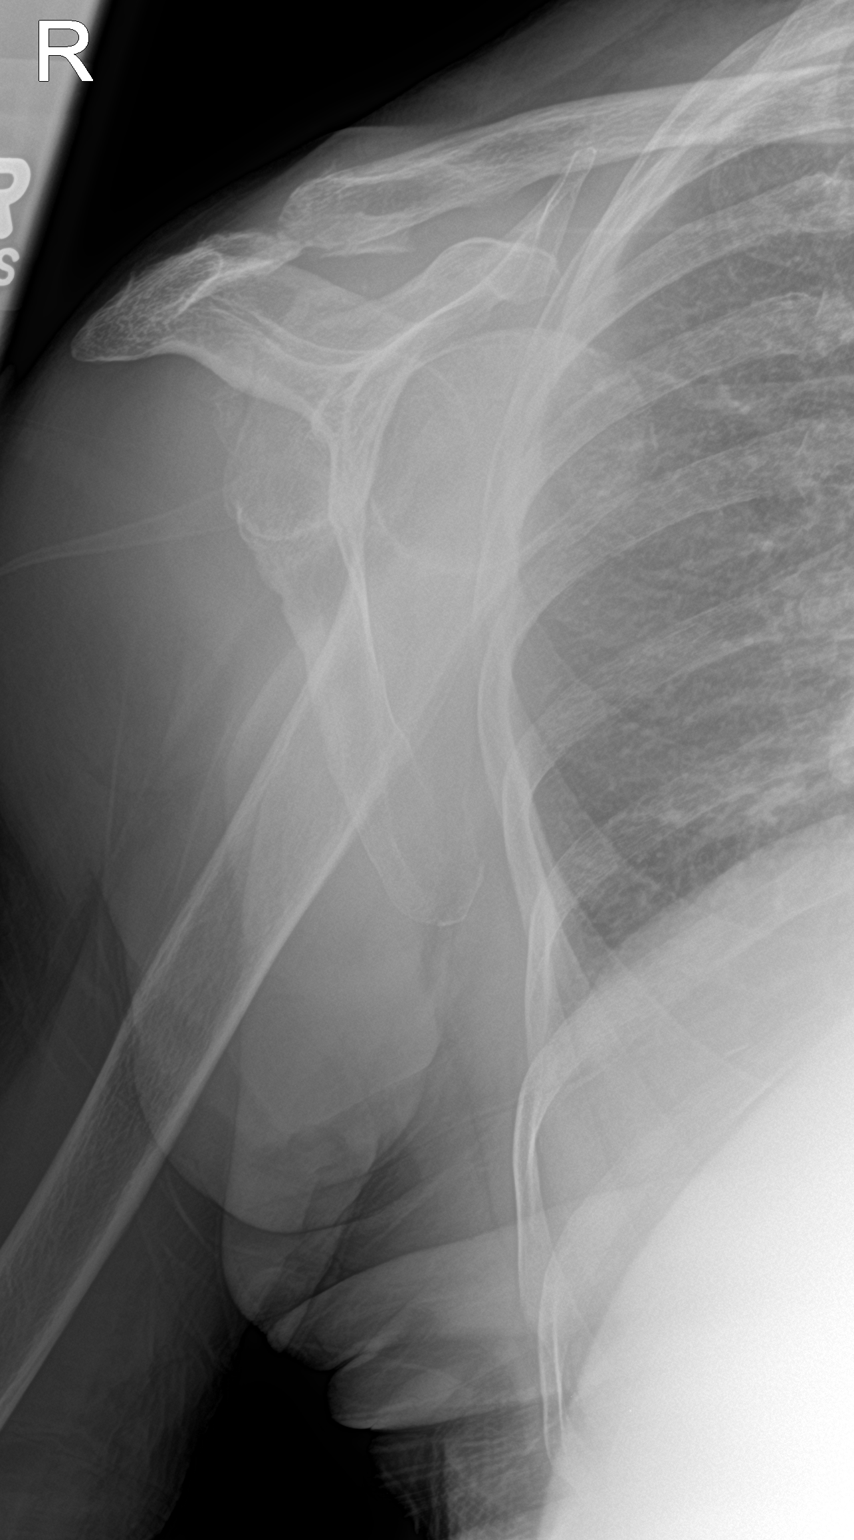

[shoulder ap neutral]
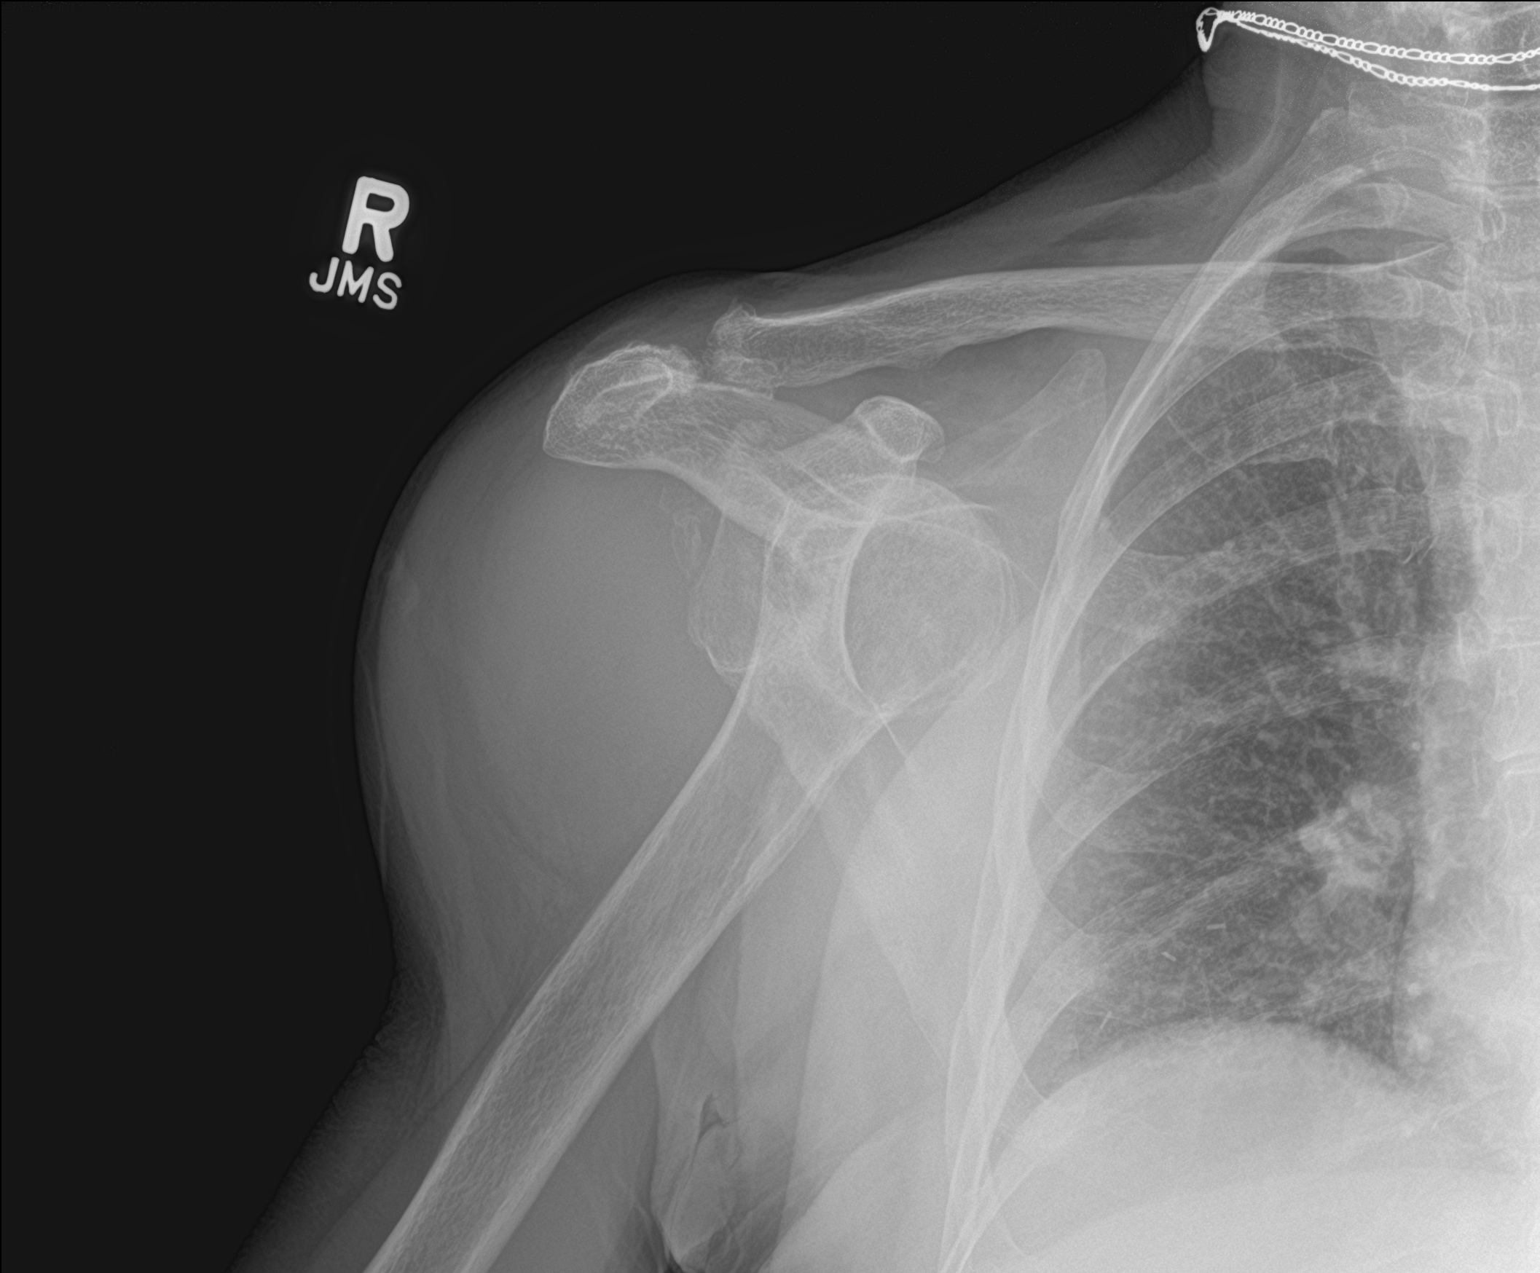

[3 of 3 positions shown; findings below may reference images not displayed]

FINDINGS: Anterior glenohumeral dislocation. Bone fragments at the posterior
glenoid appear corticated and are favored chronic/degenerative.
Suspect these were superimposed on the humeral head on prior frontal
radiograph as well. These will be re-evaluated at follow-up.
Degenerative spurring at the glenohumeral and acromioclavicular
joints. Generalized osteopenia.
IMPRESSION: 1. Anterior glenohumeral dislocation.
2. Bone fragments at the posterior glenoid appear corticated and are
favored chronic/degenerative.
